# Patient Record
Sex: Male | Born: 1935 | Race: White | Hispanic: No | Marital: Married | Smoking: Former smoker
Health system: Southern US, Community
[De-identification: ages and names within clinical notes are randomized; demographics above are authoritative.]

## PROBLEM LIST (undated history)

## (undated) DIAGNOSIS — R42 Dizziness and giddiness: Secondary | ICD-10-CM

## (undated) DIAGNOSIS — G629 Polyneuropathy, unspecified: Secondary | ICD-10-CM

## (undated) DIAGNOSIS — I10 Essential (primary) hypertension: Secondary | ICD-10-CM

## (undated) DIAGNOSIS — Z860100 Personal history of colon polyps, unspecified: Secondary | ICD-10-CM

## (undated) DIAGNOSIS — H3530 Unspecified macular degeneration: Secondary | ICD-10-CM

## (undated) DIAGNOSIS — K219 Gastro-esophageal reflux disease without esophagitis: Secondary | ICD-10-CM

## (undated) DIAGNOSIS — E785 Hyperlipidemia, unspecified: Secondary | ICD-10-CM

## (undated) DIAGNOSIS — G252 Other specified forms of tremor: Secondary | ICD-10-CM

## (undated) DIAGNOSIS — N400 Enlarged prostate without lower urinary tract symptoms: Secondary | ICD-10-CM

## (undated) DIAGNOSIS — H9190 Unspecified hearing loss, unspecified ear: Secondary | ICD-10-CM

## (undated) DIAGNOSIS — G4730 Sleep apnea, unspecified: Secondary | ICD-10-CM

## (undated) DIAGNOSIS — I2510 Atherosclerotic heart disease of native coronary artery without angina pectoris: Secondary | ICD-10-CM

## (undated) DIAGNOSIS — H269 Unspecified cataract: Secondary | ICD-10-CM

## (undated) DIAGNOSIS — I219 Acute myocardial infarction, unspecified: Secondary | ICD-10-CM

## (undated) DIAGNOSIS — K259 Gastric ulcer, unspecified as acute or chronic, without hemorrhage or perforation: Secondary | ICD-10-CM

## (undated) DIAGNOSIS — Z87442 Personal history of urinary calculi: Secondary | ICD-10-CM

## (undated) DIAGNOSIS — M1990 Unspecified osteoarthritis, unspecified site: Secondary | ICD-10-CM

## (undated) DIAGNOSIS — J449 Chronic obstructive pulmonary disease, unspecified: Secondary | ICD-10-CM

## (undated) DIAGNOSIS — J189 Pneumonia, unspecified organism: Secondary | ICD-10-CM

## (undated) HISTORY — PX: TONSILLECTOMY: SUR1361

## (undated) HISTORY — DX: Unspecified macular degeneration: H35.30

## (undated) HISTORY — PX: EYE SURGERY: SHX253

## (undated) HISTORY — PX: OTHER SURGICAL HISTORY: SHX169

## (undated) HISTORY — PX: COLONOSCOPY: SHX174

---

## 1960-02-24 HISTORY — PX: NECK SURGERY: SHX720

## 2003-12-10 ENCOUNTER — Ambulatory Visit: Payer: Self-pay | Admitting: Internal Medicine

## 2005-02-23 HISTORY — PX: BACK SURGERY: SHX140

## 2005-02-24 ENCOUNTER — Inpatient Hospital Stay (HOSPITAL_COMMUNITY): Admission: RE | Admit: 2005-02-24 | Discharge: 2005-02-26 | Payer: Self-pay | Admitting: Neurosurgery

## 2005-04-14 ENCOUNTER — Ambulatory Visit: Payer: Self-pay | Admitting: Ophthalmology

## 2005-09-09 ENCOUNTER — Emergency Department: Payer: Self-pay | Admitting: Emergency Medicine

## 2005-10-16 ENCOUNTER — Ambulatory Visit: Payer: Self-pay | Admitting: Internal Medicine

## 2005-11-06 ENCOUNTER — Ambulatory Visit: Payer: Self-pay | Admitting: Internal Medicine

## 2007-02-24 DIAGNOSIS — I219 Acute myocardial infarction, unspecified: Secondary | ICD-10-CM

## 2007-02-24 HISTORY — PX: CORONARY ARTERY BYPASS GRAFT: SHX141

## 2007-02-24 HISTORY — DX: Acute myocardial infarction, unspecified: I21.9

## 2007-02-24 HISTORY — PX: OTHER SURGICAL HISTORY: SHX169

## 2007-07-30 ENCOUNTER — Other Ambulatory Visit: Payer: Self-pay

## 2007-07-30 ENCOUNTER — Emergency Department: Payer: Self-pay | Admitting: Emergency Medicine

## 2007-08-07 ENCOUNTER — Emergency Department: Payer: Self-pay | Admitting: Emergency Medicine

## 2007-10-26 ENCOUNTER — Encounter: Payer: Self-pay | Admitting: Internal Medicine

## 2007-11-24 ENCOUNTER — Encounter: Payer: Self-pay | Admitting: Internal Medicine

## 2007-12-25 ENCOUNTER — Encounter: Payer: Self-pay | Admitting: Internal Medicine

## 2008-01-24 ENCOUNTER — Encounter: Payer: Self-pay | Admitting: Internal Medicine

## 2009-04-28 ENCOUNTER — Emergency Department: Payer: Self-pay | Admitting: Emergency Medicine

## 2009-05-04 ENCOUNTER — Emergency Department: Payer: Self-pay | Admitting: Unknown Physician Specialty

## 2009-09-10 ENCOUNTER — Ambulatory Visit: Payer: Self-pay | Admitting: Internal Medicine

## 2009-09-16 ENCOUNTER — Ambulatory Visit: Payer: Self-pay | Admitting: Internal Medicine

## 2011-03-04 DIAGNOSIS — I119 Hypertensive heart disease without heart failure: Secondary | ICD-10-CM | POA: Diagnosis not present

## 2011-03-04 DIAGNOSIS — E782 Mixed hyperlipidemia: Secondary | ICD-10-CM | POA: Diagnosis not present

## 2011-03-04 DIAGNOSIS — I2510 Atherosclerotic heart disease of native coronary artery without angina pectoris: Secondary | ICD-10-CM | POA: Diagnosis not present

## 2011-03-04 DIAGNOSIS — R0602 Shortness of breath: Secondary | ICD-10-CM | POA: Diagnosis not present

## 2011-04-06 DIAGNOSIS — R109 Unspecified abdominal pain: Secondary | ICD-10-CM | POA: Diagnosis not present

## 2011-04-17 DIAGNOSIS — R51 Headache: Secondary | ICD-10-CM | POA: Diagnosis not present

## 2011-04-17 DIAGNOSIS — I10 Essential (primary) hypertension: Secondary | ICD-10-CM | POA: Diagnosis not present

## 2011-04-17 DIAGNOSIS — Z7982 Long term (current) use of aspirin: Secondary | ICD-10-CM | POA: Diagnosis not present

## 2011-04-17 DIAGNOSIS — Z79899 Other long term (current) drug therapy: Secondary | ICD-10-CM | POA: Diagnosis not present

## 2011-04-17 DIAGNOSIS — E785 Hyperlipidemia, unspecified: Secondary | ICD-10-CM | POA: Diagnosis not present

## 2011-04-17 DIAGNOSIS — Z888 Allergy status to other drugs, medicaments and biological substances status: Secondary | ICD-10-CM | POA: Diagnosis not present

## 2011-04-17 DIAGNOSIS — I2510 Atherosclerotic heart disease of native coronary artery without angina pectoris: Secondary | ICD-10-CM | POA: Diagnosis not present

## 2011-04-28 DIAGNOSIS — I10 Essential (primary) hypertension: Secondary | ICD-10-CM | POA: Diagnosis not present

## 2011-04-28 DIAGNOSIS — E782 Mixed hyperlipidemia: Secondary | ICD-10-CM | POA: Diagnosis not present

## 2011-04-28 DIAGNOSIS — G4730 Sleep apnea, unspecified: Secondary | ICD-10-CM | POA: Diagnosis not present

## 2011-05-26 DIAGNOSIS — H93299 Other abnormal auditory perceptions, unspecified ear: Secondary | ICD-10-CM | POA: Diagnosis not present

## 2011-05-27 DIAGNOSIS — H903 Sensorineural hearing loss, bilateral: Secondary | ICD-10-CM | POA: Diagnosis not present

## 2011-06-17 DIAGNOSIS — I10 Essential (primary) hypertension: Secondary | ICD-10-CM | POA: Diagnosis not present

## 2011-06-17 DIAGNOSIS — E785 Hyperlipidemia, unspecified: Secondary | ICD-10-CM | POA: Diagnosis not present

## 2011-06-17 DIAGNOSIS — I2510 Atherosclerotic heart disease of native coronary artery without angina pectoris: Secondary | ICD-10-CM | POA: Diagnosis not present

## 2011-06-17 DIAGNOSIS — Z0000 Encounter for general adult medical examination without abnormal findings: Secondary | ICD-10-CM | POA: Diagnosis not present

## 2011-07-08 DIAGNOSIS — R042 Hemoptysis: Secondary | ICD-10-CM | POA: Diagnosis not present

## 2011-07-13 ENCOUNTER — Ambulatory Visit: Payer: Self-pay | Admitting: Family Medicine

## 2011-07-13 DIAGNOSIS — R918 Other nonspecific abnormal finding of lung field: Secondary | ICD-10-CM | POA: Diagnosis not present

## 2011-07-13 DIAGNOSIS — R042 Hemoptysis: Secondary | ICD-10-CM | POA: Diagnosis not present

## 2011-07-15 ENCOUNTER — Ambulatory Visit: Payer: Self-pay | Admitting: Specialist

## 2011-07-15 DIAGNOSIS — R918 Other nonspecific abnormal finding of lung field: Secondary | ICD-10-CM | POA: Diagnosis not present

## 2011-07-15 DIAGNOSIS — R042 Hemoptysis: Secondary | ICD-10-CM | POA: Diagnosis not present

## 2011-07-15 DIAGNOSIS — E049 Nontoxic goiter, unspecified: Secondary | ICD-10-CM | POA: Diagnosis not present

## 2011-07-17 DIAGNOSIS — J449 Chronic obstructive pulmonary disease, unspecified: Secondary | ICD-10-CM | POA: Diagnosis not present

## 2011-07-17 DIAGNOSIS — R0602 Shortness of breath: Secondary | ICD-10-CM | POA: Diagnosis not present

## 2011-07-17 DIAGNOSIS — J984 Other disorders of lung: Secondary | ICD-10-CM | POA: Diagnosis not present

## 2011-09-09 DIAGNOSIS — G4730 Sleep apnea, unspecified: Secondary | ICD-10-CM | POA: Diagnosis not present

## 2011-09-09 DIAGNOSIS — I252 Old myocardial infarction: Secondary | ICD-10-CM | POA: Diagnosis not present

## 2011-09-09 DIAGNOSIS — I10 Essential (primary) hypertension: Secondary | ICD-10-CM | POA: Diagnosis not present

## 2011-09-09 DIAGNOSIS — E782 Mixed hyperlipidemia: Secondary | ICD-10-CM | POA: Diagnosis not present

## 2011-09-24 DIAGNOSIS — J441 Chronic obstructive pulmonary disease with (acute) exacerbation: Secondary | ICD-10-CM | POA: Diagnosis not present

## 2011-09-24 DIAGNOSIS — R05 Cough: Secondary | ICD-10-CM | POA: Diagnosis not present

## 2011-09-24 DIAGNOSIS — R059 Cough, unspecified: Secondary | ICD-10-CM | POA: Diagnosis not present

## 2011-12-02 DIAGNOSIS — H26499 Other secondary cataract, unspecified eye: Secondary | ICD-10-CM | POA: Diagnosis not present

## 2012-01-19 ENCOUNTER — Ambulatory Visit: Payer: Self-pay | Admitting: Specialist

## 2012-01-19 DIAGNOSIS — J984 Other disorders of lung: Secondary | ICD-10-CM | POA: Diagnosis not present

## 2012-01-19 DIAGNOSIS — R918 Other nonspecific abnormal finding of lung field: Secondary | ICD-10-CM | POA: Diagnosis not present

## 2012-03-14 DIAGNOSIS — I10 Essential (primary) hypertension: Secondary | ICD-10-CM | POA: Diagnosis not present

## 2012-03-14 DIAGNOSIS — K2100 Gastro-esophageal reflux disease with esophagitis, without bleeding: Secondary | ICD-10-CM | POA: Diagnosis not present

## 2012-03-14 DIAGNOSIS — I25810 Atherosclerosis of coronary artery bypass graft(s) without angina pectoris: Secondary | ICD-10-CM | POA: Diagnosis not present

## 2012-03-14 DIAGNOSIS — E785 Hyperlipidemia, unspecified: Secondary | ICD-10-CM | POA: Diagnosis not present

## 2012-05-16 DIAGNOSIS — G4730 Sleep apnea, unspecified: Secondary | ICD-10-CM | POA: Diagnosis not present

## 2012-05-16 DIAGNOSIS — E785 Hyperlipidemia, unspecified: Secondary | ICD-10-CM | POA: Diagnosis not present

## 2012-05-16 DIAGNOSIS — I10 Essential (primary) hypertension: Secondary | ICD-10-CM | POA: Diagnosis not present

## 2012-05-16 DIAGNOSIS — I25810 Atherosclerosis of coronary artery bypass graft(s) without angina pectoris: Secondary | ICD-10-CM | POA: Diagnosis not present

## 2012-06-22 DIAGNOSIS — Z0000 Encounter for general adult medical examination without abnormal findings: Secondary | ICD-10-CM | POA: Diagnosis not present

## 2012-06-22 DIAGNOSIS — I10 Essential (primary) hypertension: Secondary | ICD-10-CM | POA: Diagnosis not present

## 2012-06-22 DIAGNOSIS — I2510 Atherosclerotic heart disease of native coronary artery without angina pectoris: Secondary | ICD-10-CM | POA: Diagnosis not present

## 2012-06-22 DIAGNOSIS — IMO0002 Reserved for concepts with insufficient information to code with codable children: Secondary | ICD-10-CM | POA: Diagnosis not present

## 2012-06-22 DIAGNOSIS — E7800 Pure hypercholesterolemia, unspecified: Secondary | ICD-10-CM | POA: Diagnosis not present

## 2012-06-22 DIAGNOSIS — R198 Other specified symptoms and signs involving the digestive system and abdomen: Secondary | ICD-10-CM | POA: Diagnosis not present

## 2012-06-27 DIAGNOSIS — J209 Acute bronchitis, unspecified: Secondary | ICD-10-CM | POA: Diagnosis not present

## 2012-06-27 DIAGNOSIS — J069 Acute upper respiratory infection, unspecified: Secondary | ICD-10-CM | POA: Diagnosis not present

## 2012-06-28 DIAGNOSIS — L819 Disorder of pigmentation, unspecified: Secondary | ICD-10-CM | POA: Diagnosis not present

## 2012-06-28 DIAGNOSIS — D485 Neoplasm of uncertain behavior of skin: Secondary | ICD-10-CM | POA: Diagnosis not present

## 2012-06-28 DIAGNOSIS — D044 Carcinoma in situ of skin of scalp and neck: Secondary | ICD-10-CM | POA: Diagnosis not present

## 2012-06-28 DIAGNOSIS — L821 Other seborrheic keratosis: Secondary | ICD-10-CM | POA: Diagnosis not present

## 2012-07-04 ENCOUNTER — Ambulatory Visit: Payer: Self-pay | Admitting: Specialist

## 2012-07-04 DIAGNOSIS — Z951 Presence of aortocoronary bypass graft: Secondary | ICD-10-CM | POA: Diagnosis not present

## 2012-07-04 DIAGNOSIS — IMO0002 Reserved for concepts with insufficient information to code with codable children: Secondary | ICD-10-CM | POA: Diagnosis not present

## 2012-07-04 DIAGNOSIS — J984 Other disorders of lung: Secondary | ICD-10-CM | POA: Diagnosis not present

## 2012-07-04 DIAGNOSIS — R918 Other nonspecific abnormal finding of lung field: Secondary | ICD-10-CM | POA: Diagnosis not present

## 2012-07-04 DIAGNOSIS — Q619 Cystic kidney disease, unspecified: Secondary | ICD-10-CM | POA: Diagnosis not present

## 2012-07-04 DIAGNOSIS — E049 Nontoxic goiter, unspecified: Secondary | ICD-10-CM | POA: Diagnosis not present

## 2012-07-06 DIAGNOSIS — I119 Hypertensive heart disease without heart failure: Secondary | ICD-10-CM | POA: Diagnosis not present

## 2012-07-06 DIAGNOSIS — I495 Sick sinus syndrome: Secondary | ICD-10-CM | POA: Diagnosis not present

## 2012-07-06 DIAGNOSIS — I2510 Atherosclerotic heart disease of native coronary artery without angina pectoris: Secondary | ICD-10-CM | POA: Diagnosis not present

## 2012-07-06 DIAGNOSIS — E782 Mixed hyperlipidemia: Secondary | ICD-10-CM | POA: Diagnosis not present

## 2012-07-07 DIAGNOSIS — E785 Hyperlipidemia, unspecified: Secondary | ICD-10-CM | POA: Diagnosis not present

## 2012-07-07 DIAGNOSIS — R259 Unspecified abnormal involuntary movements: Secondary | ICD-10-CM | POA: Diagnosis not present

## 2012-07-07 DIAGNOSIS — R262 Difficulty in walking, not elsewhere classified: Secondary | ICD-10-CM | POA: Diagnosis not present

## 2012-07-07 DIAGNOSIS — R42 Dizziness and giddiness: Secondary | ICD-10-CM | POA: Diagnosis not present

## 2012-07-11 DIAGNOSIS — J449 Chronic obstructive pulmonary disease, unspecified: Secondary | ICD-10-CM | POA: Diagnosis not present

## 2012-07-11 DIAGNOSIS — J984 Other disorders of lung: Secondary | ICD-10-CM | POA: Diagnosis not present

## 2012-07-12 ENCOUNTER — Encounter: Payer: Self-pay | Admitting: Neurology

## 2012-07-12 DIAGNOSIS — IMO0001 Reserved for inherently not codable concepts without codable children: Secondary | ICD-10-CM | POA: Diagnosis not present

## 2012-07-12 DIAGNOSIS — R262 Difficulty in walking, not elsewhere classified: Secondary | ICD-10-CM | POA: Diagnosis not present

## 2012-07-12 DIAGNOSIS — M6281 Muscle weakness (generalized): Secondary | ICD-10-CM | POA: Diagnosis not present

## 2012-07-19 DIAGNOSIS — M6281 Muscle weakness (generalized): Secondary | ICD-10-CM | POA: Diagnosis not present

## 2012-07-19 DIAGNOSIS — IMO0001 Reserved for inherently not codable concepts without codable children: Secondary | ICD-10-CM | POA: Diagnosis not present

## 2012-07-19 DIAGNOSIS — R262 Difficulty in walking, not elsewhere classified: Secondary | ICD-10-CM | POA: Diagnosis not present

## 2012-07-21 DIAGNOSIS — R262 Difficulty in walking, not elsewhere classified: Secondary | ICD-10-CM | POA: Diagnosis not present

## 2012-07-21 DIAGNOSIS — IMO0001 Reserved for inherently not codable concepts without codable children: Secondary | ICD-10-CM | POA: Diagnosis not present

## 2012-07-21 DIAGNOSIS — M6281 Muscle weakness (generalized): Secondary | ICD-10-CM | POA: Diagnosis not present

## 2012-07-24 DIAGNOSIS — IMO0001 Reserved for inherently not codable concepts without codable children: Secondary | ICD-10-CM | POA: Diagnosis not present

## 2012-07-24 DIAGNOSIS — M6281 Muscle weakness (generalized): Secondary | ICD-10-CM | POA: Diagnosis not present

## 2012-07-24 DIAGNOSIS — G609 Hereditary and idiopathic neuropathy, unspecified: Secondary | ICD-10-CM | POA: Diagnosis not present

## 2012-07-24 DIAGNOSIS — R262 Difficulty in walking, not elsewhere classified: Secondary | ICD-10-CM | POA: Diagnosis not present

## 2012-07-25 ENCOUNTER — Ambulatory Visit: Payer: Self-pay | Admitting: Specialist

## 2012-07-25 DIAGNOSIS — N281 Cyst of kidney, acquired: Secondary | ICD-10-CM | POA: Diagnosis not present

## 2012-07-25 DIAGNOSIS — Q619 Cystic kidney disease, unspecified: Secondary | ICD-10-CM | POA: Diagnosis not present

## 2012-07-26 ENCOUNTER — Encounter: Payer: Self-pay | Admitting: Neurology

## 2012-08-10 DIAGNOSIS — G250 Essential tremor: Secondary | ICD-10-CM | POA: Diagnosis not present

## 2012-08-10 DIAGNOSIS — M545 Low back pain, unspecified: Secondary | ICD-10-CM | POA: Diagnosis not present

## 2012-08-10 DIAGNOSIS — R262 Difficulty in walking, not elsewhere classified: Secondary | ICD-10-CM | POA: Diagnosis not present

## 2012-08-10 DIAGNOSIS — R42 Dizziness and giddiness: Secondary | ICD-10-CM | POA: Diagnosis not present

## 2012-08-10 DIAGNOSIS — G252 Other specified forms of tremor: Secondary | ICD-10-CM | POA: Diagnosis not present

## 2012-08-29 DIAGNOSIS — N281 Cyst of kidney, acquired: Secondary | ICD-10-CM | POA: Diagnosis not present

## 2012-08-29 DIAGNOSIS — N183 Chronic kidney disease, stage 3 unspecified: Secondary | ICD-10-CM | POA: Diagnosis not present

## 2012-09-20 DIAGNOSIS — I25810 Atherosclerosis of coronary artery bypass graft(s) without angina pectoris: Secondary | ICD-10-CM | POA: Diagnosis not present

## 2012-09-20 DIAGNOSIS — I369 Nonrheumatic tricuspid valve disorder, unspecified: Secondary | ICD-10-CM | POA: Diagnosis not present

## 2012-09-20 DIAGNOSIS — I10 Essential (primary) hypertension: Secondary | ICD-10-CM | POA: Diagnosis not present

## 2012-09-20 DIAGNOSIS — E785 Hyperlipidemia, unspecified: Secondary | ICD-10-CM | POA: Diagnosis not present

## 2012-09-22 DIAGNOSIS — Z85828 Personal history of other malignant neoplasm of skin: Secondary | ICD-10-CM | POA: Diagnosis not present

## 2012-09-22 DIAGNOSIS — D239 Other benign neoplasm of skin, unspecified: Secondary | ICD-10-CM | POA: Diagnosis not present

## 2012-09-22 DIAGNOSIS — L578 Other skin changes due to chronic exposure to nonionizing radiation: Secondary | ICD-10-CM | POA: Diagnosis not present

## 2012-09-22 DIAGNOSIS — L820 Inflamed seborrheic keratosis: Secondary | ICD-10-CM | POA: Diagnosis not present

## 2012-09-22 DIAGNOSIS — D1800 Hemangioma unspecified site: Secondary | ICD-10-CM | POA: Diagnosis not present

## 2012-09-22 DIAGNOSIS — L821 Other seborrheic keratosis: Secondary | ICD-10-CM | POA: Diagnosis not present

## 2012-10-18 DIAGNOSIS — E785 Hyperlipidemia, unspecified: Secondary | ICD-10-CM | POA: Diagnosis not present

## 2012-10-18 DIAGNOSIS — I2510 Atherosclerotic heart disease of native coronary artery without angina pectoris: Secondary | ICD-10-CM | POA: Diagnosis not present

## 2012-10-18 DIAGNOSIS — G4730 Sleep apnea, unspecified: Secondary | ICD-10-CM | POA: Diagnosis not present

## 2012-10-18 DIAGNOSIS — I10 Essential (primary) hypertension: Secondary | ICD-10-CM | POA: Diagnosis not present

## 2012-11-09 DIAGNOSIS — I25810 Atherosclerosis of coronary artery bypass graft(s) without angina pectoris: Secondary | ICD-10-CM | POA: Diagnosis not present

## 2012-11-09 DIAGNOSIS — E785 Hyperlipidemia, unspecified: Secondary | ICD-10-CM | POA: Diagnosis not present

## 2012-11-09 DIAGNOSIS — I059 Rheumatic mitral valve disease, unspecified: Secondary | ICD-10-CM | POA: Diagnosis not present

## 2012-11-09 DIAGNOSIS — I10 Essential (primary) hypertension: Secondary | ICD-10-CM | POA: Diagnosis not present

## 2012-11-23 DIAGNOSIS — I25810 Atherosclerosis of coronary artery bypass graft(s) without angina pectoris: Secondary | ICD-10-CM | POA: Diagnosis not present

## 2012-11-23 DIAGNOSIS — R0609 Other forms of dyspnea: Secondary | ICD-10-CM | POA: Diagnosis not present

## 2012-11-23 HISTORY — PX: PROSTATE SURGERY: SHX751

## 2012-11-28 DIAGNOSIS — IMO0002 Reserved for concepts with insufficient information to code with codable children: Secondary | ICD-10-CM | POA: Diagnosis not present

## 2012-11-28 DIAGNOSIS — R269 Unspecified abnormalities of gait and mobility: Secondary | ICD-10-CM | POA: Diagnosis not present

## 2012-11-28 DIAGNOSIS — M4712 Other spondylosis with myelopathy, cervical region: Secondary | ICD-10-CM | POA: Diagnosis not present

## 2012-11-28 DIAGNOSIS — M4120 Other idiopathic scoliosis, site unspecified: Secondary | ICD-10-CM | POA: Diagnosis not present

## 2012-11-30 ENCOUNTER — Other Ambulatory Visit: Payer: Self-pay | Admitting: Neurosurgery

## 2012-11-30 DIAGNOSIS — M419 Scoliosis, unspecified: Secondary | ICD-10-CM

## 2012-11-30 DIAGNOSIS — E782 Mixed hyperlipidemia: Secondary | ICD-10-CM | POA: Diagnosis not present

## 2012-11-30 DIAGNOSIS — M4712 Other spondylosis with myelopathy, cervical region: Secondary | ICD-10-CM

## 2012-11-30 DIAGNOSIS — I059 Rheumatic mitral valve disease, unspecified: Secondary | ICD-10-CM | POA: Diagnosis not present

## 2012-11-30 DIAGNOSIS — I2510 Atherosclerotic heart disease of native coronary artery without angina pectoris: Secondary | ICD-10-CM | POA: Diagnosis not present

## 2012-11-30 DIAGNOSIS — I119 Hypertensive heart disease without heart failure: Secondary | ICD-10-CM | POA: Diagnosis not present

## 2012-12-12 ENCOUNTER — Ambulatory Visit
Admission: RE | Admit: 2012-12-12 | Discharge: 2012-12-12 | Disposition: A | Discharge status: Home / Self Care | Payer: PRIVATE HEALTH INSURANCE | Source: Ambulatory Visit | Attending: Neurosurgery | Admitting: Neurosurgery

## 2012-12-12 ENCOUNTER — Ambulatory Visit
Admission: RE | Admit: 2012-12-12 | Discharge: 2012-12-12 | Disposition: A | Discharge status: Home / Self Care | Payer: Medicare Other | Source: Ambulatory Visit | Attending: Neurosurgery | Admitting: Neurosurgery

## 2012-12-12 DIAGNOSIS — M4712 Other spondylosis with myelopathy, cervical region: Secondary | ICD-10-CM

## 2012-12-12 DIAGNOSIS — M419 Scoliosis, unspecified: Secondary | ICD-10-CM

## 2012-12-12 DIAGNOSIS — M47812 Spondylosis without myelopathy or radiculopathy, cervical region: Secondary | ICD-10-CM | POA: Diagnosis not present

## 2012-12-12 DIAGNOSIS — M4802 Spinal stenosis, cervical region: Secondary | ICD-10-CM | POA: Diagnosis not present

## 2012-12-12 DIAGNOSIS — M5126 Other intervertebral disc displacement, lumbar region: Secondary | ICD-10-CM | POA: Diagnosis not present

## 2012-12-12 DIAGNOSIS — M48061 Spinal stenosis, lumbar region without neurogenic claudication: Secondary | ICD-10-CM | POA: Diagnosis not present

## 2012-12-12 MED ORDER — GADOBENATE DIMEGLUMINE 529 MG/ML IV SOLN
16.0000 mL | Freq: Once | INTRAVENOUS | Status: AC | PRN
  Administered 2012-12-12: 16 mL via INTRAVENOUS

## 2012-12-13 ENCOUNTER — Ambulatory Visit
Admission: RE | Admit: 2012-12-13 | Discharge: 2012-12-13 | Disposition: A | Discharge status: Home / Self Care | Payer: PRIVATE HEALTH INSURANCE | Source: Ambulatory Visit | Attending: Neurosurgery | Admitting: Neurosurgery

## 2012-12-13 ENCOUNTER — Other Ambulatory Visit: Payer: Self-pay | Admitting: Neurosurgery

## 2012-12-13 DIAGNOSIS — G939 Disorder of brain, unspecified: Secondary | ICD-10-CM

## 2012-12-13 DIAGNOSIS — G9389 Other specified disorders of brain: Secondary | ICD-10-CM | POA: Diagnosis not present

## 2012-12-13 MED ORDER — GADOBENATE DIMEGLUMINE 529 MG/ML IV SOLN
16.0000 mL | Freq: Once | INTRAVENOUS | Status: AC | PRN
  Administered 2012-12-13: 16 mL via INTRAVENOUS

## 2012-12-14 DIAGNOSIS — M48061 Spinal stenosis, lumbar region without neurogenic claudication: Secondary | ICD-10-CM | POA: Diagnosis not present

## 2012-12-14 DIAGNOSIS — M4714 Other spondylosis with myelopathy, thoracic region: Secondary | ICD-10-CM | POA: Diagnosis not present

## 2012-12-14 DIAGNOSIS — G939 Disorder of brain, unspecified: Secondary | ICD-10-CM | POA: Diagnosis not present

## 2012-12-14 DIAGNOSIS — IMO0002 Reserved for concepts with insufficient information to code with codable children: Secondary | ICD-10-CM | POA: Diagnosis not present

## 2012-12-15 ENCOUNTER — Other Ambulatory Visit: Payer: Self-pay | Admitting: Neurosurgery

## 2012-12-15 DIAGNOSIS — G9389 Other specified disorders of brain: Secondary | ICD-10-CM

## 2012-12-16 ENCOUNTER — Ambulatory Visit
Admission: RE | Admit: 2012-12-16 | Discharge: 2012-12-16 | Disposition: A | Discharge status: Home / Self Care | Payer: Medicare Other | Source: Ambulatory Visit | Attending: Neurosurgery | Admitting: Neurosurgery

## 2012-12-16 ENCOUNTER — Ambulatory Visit
Admission: RE | Admit: 2012-12-16 | Discharge: 2012-12-16 | Disposition: A | Discharge status: Home / Self Care | Payer: PRIVATE HEALTH INSURANCE | Source: Ambulatory Visit | Attending: Neurosurgery | Admitting: Neurosurgery

## 2012-12-16 DIAGNOSIS — G9389 Other specified disorders of brain: Secondary | ICD-10-CM

## 2012-12-19 ENCOUNTER — Telehealth: Payer: Self-pay | Admitting: Oncology

## 2012-12-19 ENCOUNTER — Ambulatory Visit
Admission: RE | Admit: 2012-12-19 | Discharge: 2012-12-19 | Disposition: A | Discharge status: Home / Self Care | Payer: PRIVATE HEALTH INSURANCE | Source: Ambulatory Visit | Attending: Neurosurgery | Admitting: Neurosurgery

## 2012-12-19 ENCOUNTER — Ambulatory Visit
Admission: RE | Admit: 2012-12-19 | Discharge: 2012-12-19 | Disposition: A | Discharge status: Home / Self Care | Payer: Medicare Other | Source: Ambulatory Visit | Attending: Neurosurgery | Admitting: Neurosurgery

## 2012-12-19 DIAGNOSIS — N2889 Other specified disorders of kidney and ureter: Secondary | ICD-10-CM | POA: Diagnosis not present

## 2012-12-19 DIAGNOSIS — K7689 Other specified diseases of liver: Secondary | ICD-10-CM | POA: Diagnosis not present

## 2012-12-19 DIAGNOSIS — J984 Other disorders of lung: Secondary | ICD-10-CM | POA: Diagnosis not present

## 2012-12-19 MED ORDER — IOHEXOL 300 MG/ML  SOLN
100.0000 mL | Freq: Once | INTRAMUSCULAR | Status: AC | PRN
  Administered 2012-12-19: 100 mL via INTRAVENOUS

## 2012-12-19 NOTE — Telephone Encounter (Signed)
Referral received and forward to Dr. Truett Perna per md requested additional information.

## 2012-12-21 ENCOUNTER — Telehealth: Payer: Self-pay | Admitting: Oncology

## 2012-12-21 DIAGNOSIS — E785 Hyperlipidemia, unspecified: Secondary | ICD-10-CM | POA: Diagnosis not present

## 2012-12-21 DIAGNOSIS — I10 Essential (primary) hypertension: Secondary | ICD-10-CM | POA: Diagnosis not present

## 2012-12-21 NOTE — Telephone Encounter (Signed)
[facility] FOR PT TO RETURN CALL IN RE TO REFERRAL.  °

## 2012-12-22 ENCOUNTER — Telehealth: Payer: Self-pay | Admitting: Oncology

## 2012-12-22 NOTE — Telephone Encounter (Signed)
S/W HAYLEY AT DR. STERN OFFICE AND STATED THE THE PATIENT WOULD NEED A WORK-UP/LAB PRIOR TO COMING TO SEE THE MD. Jason Cross UNDERSTOOD AND STATED SHE WOULD CALL PATIENT.

## 2012-12-27 DIAGNOSIS — G939 Disorder of brain, unspecified: Secondary | ICD-10-CM | POA: Diagnosis not present

## 2013-01-10 DIAGNOSIS — G4733 Obstructive sleep apnea (adult) (pediatric): Secondary | ICD-10-CM | POA: Diagnosis not present

## 2013-01-10 DIAGNOSIS — J984 Other disorders of lung: Secondary | ICD-10-CM | POA: Diagnosis not present

## 2013-01-10 DIAGNOSIS — R05 Cough: Secondary | ICD-10-CM | POA: Diagnosis not present

## 2013-01-10 DIAGNOSIS — R0902 Hypoxemia: Secondary | ICD-10-CM | POA: Diagnosis not present

## 2013-01-10 DIAGNOSIS — J449 Chronic obstructive pulmonary disease, unspecified: Secondary | ICD-10-CM | POA: Diagnosis not present

## 2013-01-10 DIAGNOSIS — R059 Cough, unspecified: Secondary | ICD-10-CM | POA: Diagnosis not present

## 2013-01-25 DIAGNOSIS — M48061 Spinal stenosis, lumbar region without neurogenic claudication: Secondary | ICD-10-CM | POA: Diagnosis not present

## 2013-01-25 DIAGNOSIS — IMO0002 Reserved for concepts with insufficient information to code with codable children: Secondary | ICD-10-CM | POA: Diagnosis not present

## 2013-01-25 DIAGNOSIS — M4120 Other idiopathic scoliosis, site unspecified: Secondary | ICD-10-CM | POA: Diagnosis not present

## 2013-01-25 DIAGNOSIS — M4714 Other spondylosis with myelopathy, thoracic region: Secondary | ICD-10-CM | POA: Diagnosis not present

## 2013-01-26 ENCOUNTER — Other Ambulatory Visit: Payer: Self-pay | Admitting: Neurosurgery

## 2013-02-27 ENCOUNTER — Encounter (HOSPITAL_COMMUNITY): Payer: Self-pay

## 2013-03-01 NOTE — Pre-Procedure Instructions (Signed)
Jason Cross  03/01/2013   Your procedure is scheduled on:  Fri, Jan 16 @ 9:30 AM  Report to [facility] A at 6:30 AM.  Call this number if you have problems the morning of surgery: [phone]   Remember:   Do not eat food or drink liquids after midnight.   Take these medicines the morning of surgery with A SIP OF WATER: Omprazole(Prilosec)              Stop taking your Aspirin. No Goody's,BC's,Aleve,Ibuprofen,Fish Oil,or any Herbal Medications   Do not wear jewelry  Do not wear lotions, powders, or colognes. You may wear deodorant.  Men may shave face and neck.  Do not bring valuables to the hospital.  [facility] is not responsible                  for any belongings or valuables.               Contacts, dentures or bridgework may not be worn into surgery.  Leave suitcase in the car. After surgery it may be brought to your room.  For patients admitted to the hospital, discharge time is determined by your                treatment team.                  Special Instructions: Shower using CHG 2 nights before surgery and the night before surgery.  If you shower the day of surgery use CHG.  Use special wash - you have one bottle of CHG for all showers.  You should use approximately 1/3 of the bottle for each shower.   Please read over the following fact sheets that you were given: Pain Booklet, Coughing and Deep Breathing, Blood Transfusion Information, MRSA Information and Surgical Site Infection Prevention

## 2013-03-02 ENCOUNTER — Encounter (HOSPITAL_COMMUNITY): Payer: Self-pay

## 2013-03-02 ENCOUNTER — Encounter (HOSPITAL_COMMUNITY)
Admission: RE | Admit: 2013-03-02 | Discharge: 2013-03-02 | Disposition: A | Discharge status: Home / Self Care | Payer: Medicare Other | Source: Ambulatory Visit | Attending: Neurosurgery | Admitting: Neurosurgery

## 2013-03-02 DIAGNOSIS — Z01812 Encounter for preprocedural laboratory examination: Secondary | ICD-10-CM | POA: Diagnosis not present

## 2013-03-02 DIAGNOSIS — Z01818 Encounter for other preprocedural examination: Secondary | ICD-10-CM | POA: Insufficient documentation

## 2013-03-02 HISTORY — DX: Essential (primary) hypertension: I10

## 2013-03-02 HISTORY — DX: Dizziness and giddiness: R42

## 2013-03-02 HISTORY — DX: Personal history of colonic polyps: Z86.010

## 2013-03-02 HISTORY — DX: Atherosclerotic heart disease of native coronary artery without angina pectoris: I25.10

## 2013-03-02 HISTORY — DX: Sleep apnea, unspecified: G47.30

## 2013-03-02 HISTORY — DX: Acute myocardial infarction, unspecified: I21.9

## 2013-03-02 HISTORY — DX: Unspecified hearing loss, unspecified ear: H91.90

## 2013-03-02 HISTORY — DX: Unspecified osteoarthritis, unspecified site: M19.90

## 2013-03-02 HISTORY — DX: Personal history of urinary calculi: Z87.442

## 2013-03-02 HISTORY — DX: Polyneuropathy, unspecified: G62.9

## 2013-03-02 HISTORY — DX: Unspecified cataract: H26.9

## 2013-03-02 HISTORY — DX: Hyperlipidemia, unspecified: E78.5

## 2013-03-02 HISTORY — DX: Gastro-esophageal reflux disease without esophagitis: K21.9

## 2013-03-02 HISTORY — DX: Chronic obstructive pulmonary disease, unspecified: J44.9

## 2013-03-02 HISTORY — DX: Personal history of colon polyps, unspecified: Z86.0100

## 2013-03-02 HISTORY — DX: Gastric ulcer, unspecified as acute or chronic, without hemorrhage or perforation: K25.9

## 2013-03-02 HISTORY — DX: Pneumonia, unspecified organism: J18.9

## 2013-03-02 LAB — CBC
HCT: 43.2 % (ref 39.0–52.0)
Hemoglobin: 14.5 g/dL (ref 13.0–17.0)
MCH: 30 pg (ref 26.0–34.0)
MCHC: 33.6 g/dL (ref 30.0–36.0)
MCV: 89.4 fL (ref 78.0–100.0)
PLATELETS: 239 10*3/uL (ref 150–400)
RBC: 4.83 MIL/uL (ref 4.22–5.81)
RDW: 13.4 % (ref 11.5–15.5)
WBC: 7.6 10*3/uL (ref 4.0–10.5)

## 2013-03-02 LAB — BASIC METABOLIC PANEL
BUN: 27 mg/dL — ABNORMAL HIGH (ref 6–23)
CALCIUM: 9.4 mg/dL (ref 8.4–10.5)
CO2: 24 mEq/L (ref 19–32)
Chloride: 103 mEq/L (ref 96–112)
Creatinine, Ser: 1.55 mg/dL — ABNORMAL HIGH (ref 0.50–1.35)
GFR calc Af Amer: 46 mL/min — ABNORMAL LOW (ref >90)
GFR calc non Af Amer: 40 mL/min — ABNORMAL LOW (ref >90)
Glucose, Bld: 89 mg/dL (ref 70–99)
Potassium: 3.9 mEq/L (ref 3.7–5.3)
Sodium: 142 mEq/L (ref 137–147)

## 2013-03-02 LAB — ABO/RH: ABO/RH(D): O POS

## 2013-03-02 LAB — TYPE AND SCREEN
ABO/RH(D): O POS
Antibody Screen: NEGATIVE

## 2013-03-02 LAB — SURGICAL PCR SCREEN
MRSA, PCR: NEGATIVE
STAPHYLOCOCCUS AUREUS: NEGATIVE

## 2013-03-02 NOTE — Progress Notes (Signed)
Wife states pt tends to run a low blood pressure

## 2013-03-02 NOTE — Progress Notes (Addendum)
Dr.K with [facility] in [place] is cardiologist-last visit about 4months-to request   Echo report to be requested from Dr.K  Denies ever having a stress test or heart cath  EKG to be requested from Dr. Raliegh Ip  Dr.Fleming with [facility] to request CXR   Medical MD is Dr. Pati Gallo

## 2013-03-02 NOTE — Progress Notes (Addendum)
Sleep study done at [facility] about 2+yrs ago and to be requested from Dr.K with [facility]

## 2013-03-03 ENCOUNTER — Encounter (HOSPITAL_COMMUNITY): Payer: Self-pay

## 2013-03-03 NOTE — Progress Notes (Addendum)
Anesthesia Chart Review:  Patient is a 80 year old male scheduled for L3-4, L4-5, redo decompression/fusion with T12-L1 laminectomy on 03/10/13 by Dr. Vertell Limber.    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  Cardiology notes also list OSA.  PCP is Dr. Golden Pop. Pulmonologist is Dr. Wallene Huh. Cardiologist is Dr. Serafina Royals, last visit 11/30/12.  Echo on 11/23/12 [facility]) showed mild global LV dysfunction, EF 40%.  Mild MR/TR.  CXR on 01/10/13 (Dr. Raul Del) showed: Cardiac silhouette within normal limits. Patient is status post median sternotomy. Mild areas of linear density project within the region of the lingula likely representing areas of discoid atelectasis.  Mild infiltrate cannot be excluded.  No focal regions of consolidation are identified.  The osseus structures are unremarkable.  There is flattening of the hemidiaphragms.  Hyperventilation.   Preoperative labs noted.  BUN/Cr 27/1.55.  CBC WNL.    Patient with history of CABG in 2009 and LV dysfunction by recent echo.  He did see his cardiologist in 11/2012, but denied recent functional study.  Surgery is posted for 7 hours. I think he will need cardiac clearance. I have left a voicemail with Janett Billow at Dr. Melven Sartorius office. I will follow-up with her on Monday.  In the meantime, I have requested comparison labs from Dr. Rance Muir office and last pulmonary notes.    [facility] Short Stay Center/Anesthesiology Phone [phone] 03/03/2013 5:42 PM  Addendum: 03/06/2013 12:00 PM I spoke with Janett Billow earlier this morning, and she is sending a request for cardiac clearance to Dr. Nehemiah Massed.  I reviewed his last pulmonary notes.  He saw Dr. Raul Del on 01/10/13 for f/u abnormal chest CT (stable multiple pulmonary nodules < 62mm that were stable with f/u recommended 06/2013).  He also has mild COPD  which was felt stable and was being compliant with CPAP for OSA. Spirometry showed an FVC 3.6 (90%), FEV1 2.79 (91%), FEV1 ratio 78, FEF 25-75% 87% of predicted.  Expiratory flow volume loop overall appeared normal.  There was no sleep study on file at Dr. Leane Platt' office.  PCP records and EKG from cardiology are still pending. Will follow-up once/if patient is ultimately cleared by cardiology.  Addendum: 03/08/2012 4:25 PM Received cardiology clearance from Dr. Nehemiah Massed.  EKG is still pending. PCP records show his BUN/Cr on 12/21/12 were 31/1.68 and 23/1.45 on 06/22/12, so PAT results appear within his baseline. Sleep Study from 09/10/09 [facility]) received and showed OSA and central sleep apnea with follow-up study on 09/16/09 showing effective CPAP.

## 2013-03-06 ENCOUNTER — Encounter (HOSPITAL_COMMUNITY): Payer: Self-pay

## 2013-03-08 NOTE — Progress Notes (Addendum)
Re-reqested labs and OV notes from Dr Jeananne Rama.  Stated they will fax now.  [phone] Sleep study requested from [place].  Stated they will fax now.  [phone]

## 2013-03-09 MED ORDER — CEFAZOLIN SODIUM-DEXTROSE 2-3 GM-% IV SOLR
2.0000 g | INTRAVENOUS | Status: AC
  Administered 2013-03-10: 2 g via INTRAVENOUS
  Filled 2013-03-09: qty 50

## 2013-03-09 NOTE — H&P (Signed)
Patient ID:                [id number] Patient:                     Jason Cross                                    Date of Birth:   [birth year] Visit Type:                Office Visit                                                         Date:   01/25/2013 10:45 AM Provider:                  Marchia Meiers. Vertell Limber MD   This 80 year old male presents for Follow Up of neck pain.   HISTORY OF PRESENT ILLNESS: 1.  Follow Up of neck pain  Chest/Abdomen/Pelvis CT for review. Dr. Benay Spice referral declined after review of labs.   Pt hopes to proceed with surgery to Tx pain since CA testing has been negative (to inconclusive).  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   CT, MRI on [facility]   Patient's metastatic workup has been negative.  Dr. Benay Spice was of the opinion that he did not need to see him for concern of possible malignancy.  I explained to the patient and his wife that he does have an abnormal cranial MRI which will need to be followed.  In the meantime he continues to have severe low back and bilateral lower extremity pain.  I reviewed his MRI with him and his wife.  This shows that he has spondylosis and cord compression at T12-L1 level.  He has also had significant regrowth of bone with severe spinal stenosis and marked facet hypertrophy at the L3-L5 levels.  I explained that it will be very difficult to do redo decompression without disarticulating his facet joints given the extremely large regrowth and hypertrophy of these joints.  Because of this I recommended that he undergo decompression at T12-L1 level with decompression along with fusion L3-L5 levels.  Risks and benefits were discussed in detail with the patient and he wishes to proceed with surgery.  He is fitted for an LSO brace today.  We went over details of surgery and his imaging and answered his and his wife's questions.  We will plan on getting repeat cranial imaging in 6 months.          PAST MEDICAL/SURGICAL HISTORY  (Reviewed, updated)   Disease/disorder Onset Date Management Date Comments     Open heart surgery         Surgery, cervical spine         Surgery, lumbar spine     Hypertension         Myocardial infarction                 PAST MEDICAL HISTORY, SURGICAL HISTORY, FAMILY HISTORY, SOCIAL HISTORY AND REVIEW OF SYSTEMS I have reviewed the patient's past medical, surgical, family and social history as well as the comprehensive review of systems  as included on the [facility] history form dated 11/28/2012, which I have signed.   Family History  (Reviewed, updated)   Relationship Family Member Name Deceased Age at Death Condition Onset Age Cause of Death         Family history of Hypertension   N   SOCIAL HISTORY  (Reviewed, updated) Preferred language is Unknown.              MEDICATIONS(added, continued or stopped this visit):   Medication Dose Prescribed Else Ind Started Stopped aspirin  ORAL   Y     gabapentin 300 mg capsule 300 mg Y     hydrochlorothiazide UNKNOWN Y     Prevacid 15 mg capsule,delayed release 15 mg Y     simvastatin 40 mg tablet 40 mg Y         ALLERGIES:   Ingredient Reaction Medication Name Comment CODEINE       IODINE           Vitals Date Temp F BP Pulse Ht In Wt Lb BMI BSA Pain Score 01/25/2013   131/77 65 72 180 24.41   0/10             IMPRESSION I reviewed his MRI with him and his wife.  This shows that he has spondylosis and cord compression at T12-L1 level.  He has also had significant regrowth of bone with severe spinal stenosis and marked facet hypertrophy at the L3-L5 levels.  I explained that it will be very difficult to do redo decompression without disarticulating his facet joints given the extremely large regrowth and hypertrophy of these joints.  Because of this I recommended  that he undergo decompression at T12-L1 level with decompression along with fusion L3-L5 levels.  Risks and benefits were discussed in detail with the patient and he wishes to proceed with surgery.  He is fitted for an LSO brace today.  We went over details of surgery and his imaging and answered his and his wife's questions.  We will plan on getting repeat cranial imaging in 6 months.   Assessment/Plan # Detail Type Description 1. Assessment Lumbar spinal stenosis (724.02).       2. 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       3. Assessment Lumbar radiculopathy (724.4).       4. Assessment Lumbar scoliosis (737.30).           Surgery scheduled for 03/10/13.   Orders: Diagnostic Procedures: Assessment Procedure 724.02 Redo decompression with fusion L 34 and L 45 levels with laminectomy T 12-L1                Provider:  Marchia Meiers. Vertell Limber MD  01/29/2013 02:53 PM Dictation edited by: Marchia Meiers. Vertell Limber   Patient ID:                [id number] Patient:                     Jason Cross                                    Date of Birth:   1928-04-08 Visit Type:                Office Visit  Date:   12/14/2012 03:00 PM Provider:                  Marchia Meiers. Vertell Limber   Historian:                   self   This 80 year old male presents for neck pain and back pain.   HISTORY OF PRESENT ILLNESS: 1.  neck pain  Review MRI.   I reviewed her MRI scans in detail with the patient.  I also went back over his records from 8 years ago and in the there was mention of a lesion in the L1 vertebral body.  This sounds quite similar to the lesion is identified on today's scan.  In addition he has spinal stenosis at T12-L1 with cord signal which may in fact be a cause of his leg weakness and numbness.  In addition he has significant recurrent spinal stenosis at the L3-L4 level.  This will undoubtedly contributes to his leg symptoms  as well.  I did not detect significant spinal cord compression or a cervical study but at the edge of the cervical study a clival mass lesion was identified.  This led to obtaining an MRI of the brain and brain MRI was indeed suspicious for metastatic deposit in the clivus.   Patient denies smoking history.  He says his PSA has been checked recently.  I spoke with Dr. Tammi Klippel about his symptoms and reviewed his skin with Dr. Tammi Klippel.  Dr. Tammi Klippel is concerned that this might in fact represent a metastatic lesion and feels a metastatic workup should be performed.  This would include a CT scan of the chest abdomen and pelvis.  Our recommendation would be then that he see Dr. Illene Regulus to review these studies and get help as to whether or not he has metastatic cancer.  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   I spent 45 minutes and detailed discussion with the patient and his wife and would overall her scans and they're old records as well as talked to Dr. Tyler Pita about his condition and get advice from him as to how to proceed with this complex workup. 2.  back pain  Patient states pain down his legs and feet, 9/10 on pain scale.     Medical/Surgical/Interim History Reviewed, no change. Last detailed document date:11/28/2012.     PAST MEDICAL HISTORY, SURGICAL HISTORY, FAMILY HISTORY, SOCIAL HISTORY AND REVIEW OF SYSTEMS I have reviewed the patient's past medical, surgical, family and social history as well as the comprehensive review of systems as included on the [facility] history form dated 11/28/2012, which I have signed.   Family History: Reviewed, no changes.  Last detailed document date:11/28/2012.   Social History: Reviewed, no changes. Last detailed document date: 11/28/2012.        Medications (added, continued or  stopped this visit):   Medication Dose Prescribed Else Ind Started Stopped aspirin  ORAL   Y     gabapentin 300 mg capsule 300 mg Y     hydrochlorothiazide UNKNOWN Y           Allergies:   Ingredient Reaction Medication Name Comment CODEINE       IODINE       Reviewed, no changes.     Vitals Date Temp F BP Pulse Ht  In Wt Lb BMI BSA Pain Score 12/14/2012   136/75 73 72 180 24.41   0/10         DIAGNOSTIC RESULTS Diagnostic report text CLINICAL DATA: Abnormal MRI of the cervical spine. Clivus lesion.  EXAM: MRI HEAD WITHOUT AND WITH CONTRAST  TECHNIQUE: Multiplanar, multiecho pulse sequences of the brain and surrounding structures were obtained according to standard protocol without and with intravenous contrast  CONTRAST: 65mL MULTIHANCE GADOBENATE DIMEGLUMINE 529 MG/ML IV SOLN  COMPARISON: MRI of the and cervical spine 12/12/2012.  FINDINGS: A heterogeneous within the left side of the clivus the measures and 1.3 x 2.7 x 1.5 cm. The lesion is centered off midline. This signal is heterogeneous and fresh that without significant enhancement. There is no extension beyond the and bone.  Moderate generalized atrophy and diffuse white matter disease is present bilaterally. No acute cortical infarct, hemorrhage, or mass lesion is present. The ventricles are proportionate to the degree of atrophy. No significant extra-axial fluid collection is present.  The patient is status post bilateral lens extractions. A fluid level is present in the and right sphenoid sinus. Minimal mucosal thickening is present in the left sphenoid sinus. The remaining paranasal sinuses are clear. The mastoid air cells are clear.  The postcontrast images and demonstrate no pathologic enhancement.  IMPRESSION: 1. A slightly expansile lytic paramedian clival lesion. This may represent a chondroid lesion such as a chondrosarcoma or less likely clival  chordoma. Chondromyxoid fibroma at is included in the differential diagnosis. Metastasis or plasmocytoma must also be considered. CT of the head with thin cuts through the skull base would continue to be useful in evaluation of the matrix. 2. Diffuse atrophy and white matter disease. This likely reflects the sequelae of chronic microvascular ischemia.   Electronically Signed By: Lawrence Santiago M.D. On: 12/14/2012 07:56 Diagnostic report text CLINICAL DATA: Bilateral leg weakness and numbness.  BUN and creatinine were obtained on site at [place] at  [address]  Results: BUN 18 mg/dL, Creatinine 1.3 mg/dL.  EXAM: MRI LUMBAR SPINE WITHOUT AND WITH CONTRAST  TECHNIQUE: Multiplanar and multiecho pulse sequences of the lumbar spine were obtained without and with intravenous contrast.  CONTRAST: 92mL MULTIHANCE GADOBENATE DIMEGLUMINE 529 MG/ML IV SOLN  COMPARISON: Intraoperative lumbar spine radiograph 02/24/2005.  FINDINGS: A broad-based disc herniation at T12-L1 contacts the lower thoracic spinal cord resulting in moderate central canal stenosis. There is mild cord signal abnormality at this level. The conus medullaris terminates at L1-2, within normal limits. More inferiorly, the cord signal is normal.  A diffuse area of sclerosis is evident in the posterior central aspect of the left L1 vertebral body extending into the left pedicle. There is associated enhancement. The area measures 2.8 x 2.6 x 3.0 cm. No other significant sclerotic areas are evident. Diffuse fatty infiltration is present with multiple hemangiomas. Chronic fatty endplate marrow changes are present on the right at L4-5. Slight degenerative retrolisthesis is present at L4-5 is otherwise anatomic.  T12-L1: In addition to the central canal stenosis, mild foraminal narrowing is present bilaterally.  L1-2: A broad-based disc herniation is asymmetric to the left. Mild facet hypertrophy is  worse on the left as well. This leads to moderate to severe left foraminal stenosis. More mild right foraminal narrowing is present. Moderate left and mild right lateral recess narrowing is present.  L2-3: A broad-based disc herniation is present. Moderate facet hypertrophy is evident. Mild lateral recess and foraminal narrowing is worse on the left.  L3-4: Advanced facet hypertrophy is noted. The patient is status post laminectomy. Enhancing granulation tissue is present. Moderate to severe residual central and foraminal stenosis is evident, left greater than right.  L4-5: Disc herniation is asymmetric to the left. Moderate to severe right foraminal stenosis is present. Moderate left lateral recess narrowing is present. Mild right lateral recess and left foraminal narrowing is present.  L5-S1: A mild broad-based disc herniation is present. Mild facet hypertrophy is noted bilaterally. Facet spurring contributes to mild foraminal narrowing on both sides. The central canal is patent.  IMPRESSION: 1. Broad-based disc herniation and endplate changes at [id number] results in moderate central canal stenosis with distortion of the distal thoracic spinal cord and abnormal signal, suggesting edema. This could be the etiology of the patient's symptoms. 2. 3 cm area of sclerosis in the posterior left L1 vertebral body with associated enhancement is concerning for metastatic disease. Please correlate with any history of known cancer. If no known malignancy is present, percutaneous biopsy could be performed. 3. Moderate to severe left foraminal stenosis at L1-2. 4. Moderate left and mild right lateral recess narrowing at L1-2 mild lateral recess and foraminal narrowing at L2-3 is worse on the left. 5. Moderate to severe central and foraminal stenosis at L3-4. 6. Moderate left and mild right lateral recess narrowing at L4-5. 7. Moderate to severe right and mild left foraminal stenosis  at L4-5. 8. Mild foraminal narrowing bilaterally at L5-S1 without significant central canal stenosis.   Electronically Signed By: Lawrence Santiago M.D. On: 12/12/2012 16:13 Diagnostic report text CLINICAL DATA: Neck popping and left arm tingling. Prior cervical spine operation in 1962. Cervical spondylosis with myelopathy.  EXAM: MRI CERVICAL SPINE WITHOUT CONTRAST  TECHNIQUE: Multiplanar, multisequence MR imaging was performed. No intravenous contrast was administered.  COMPARISON: None.  FINDINGS: There is a mild dextro convex torticollis with the apex at C4. 2 mm anterolisthesis of C3 on C4 appears degenerative. Postsurgical changes are present in the paraspinal soft tissues at C3-C4 and C4-C5. Cervical cord demonstrates normal caliber and intra medullary signal. Posterior fossa structures are within normal limits. There is an abnormal appearance of the clivus with a heterogeneous lesion in the medullary space measuring 3 cm craniocaudal and 13 mm AP. The appearance on this noncontrast study is indeterminate. Metastatic disease could produce this appearance if patient has known malignancy. This potentially represents a chondroid lesion however a chordoma could also produce this appearance. There is no extraosseous extension of the mass. The clivus does appear mildly expanded. Significantly, there does not appear to be mass effect on the visualized pons or middle.  Flow voids are present in both vertebral arteries. Diffuse disc desiccation is present.  C2-C3: Posterior ligamentum flavum redundancy is present. Right foraminal encroachment associated with right greater than left facet arthrosis potentially affects the right C3 nerve.  C4-C5: Disk desiccation and degeneration with shallow broad-based disc osteophyte complex. Central canal is adequately patent. Bilateral foraminal stenosis secondary to both facet arthrosis and uncovertebral spurring.  C4-C5: Shallow  broad-based disc osteophyte complex eccentric to the right. Mild central stenosis. Facet arthrosis and uncovertebral spurring produce right greater than left foraminal stenosis potentially affecting both C5 nerves.  C5-C6: Broad-based disc osteophyte complex with mild to moderate central stenosis. Disc osteophyte complex narrows the ventral subarachnoid space and mildly flattens the cord. There is right greater than left foraminal stenosis secondary to uncovertebral spurring with some contribution from facet arthrosis. AP diameter of the central canal is 9 mm.  C6-C7: Mild  central stenosis with left eccentric disc osteophyte complex. Flattening of the central and left ventral cord. There is bilateral foraminal stenosis which is mild, secondary to uncovertebral spurring bilaterally.  C7-T1: Mild central stenosis with flattening of the left ventral cord. Left eccentric disc osteophyte complex. Mild bilateral foraminal stenosis associated with uncovertebral spurring. Facet arthrosis is also present.  T1-T2 shows disc desiccation without stenosis. T2-T3 shows a right paracentral protrusion with mild central stenosis.  IMPRESSION: 1. Intramedullary lesion in the clivus is indeterminate. Followup MRI of brain with and without contrast is recommended for full visualization and further characterization. A noncontrast head CT with thin cuts and reconstructions through the posterior fossa may also be useful to assess for bony matrix. These results will be called to the ordering clinician or representative by the Radiologist Assistant, and communication documented in the PACS Dashboard. 2. Multilevel cervical spondylosis detailed above. Central stenosis is mild to moderate at C5-C6 with flattening of the ventral cord. Multilevel foraminal stenosis and facet arthrosis potentially accounts for left radicular symptoms. 3. Postoperative changes in the dorsal soft tissues at C4-C5 and C5-C6  without cervical fusion ; that type of PROCEDURE leading to these postoperative changes is unclear.   Electronically Signed By: Dereck Ligas M.D. On: 12/12/2012 16:21       IMPRESSION Clival mass of uncertain etiology.  Metastatic workup to rule out cancer.  If this is negative the patient will need to undergo laminectomy at T12-L1 for spinal cord compression as well as redo decompression at the L3-L4 levels.   Assessment/Plan # Detail Type Description 1. Assessment Brain mass (348.9).       2. Assessment Lumbar radiculopathy (724.4).       3. 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       4. Assessment Lumbar spinal stenosis (724.02).           Pain Assessment/Treatment Pain Scale: 0/10. Method: Numeric Pain Intensity Scale. Pain Assessment/Treatment follow-up plan of care: Patient denies pain in the neck or back but has pain in the legs and feet.  Patient occasionally takes Advill for the pain..   Chest abdomen pelvis CT with followup after seeing Dr. Learta Codding.   " Late entry due to failure to e-sign on date of appointment.  I attest that I reviewed the patient registration form on 12/14/12,  and am e-signing today.  This was brought to my attention due to internal audit."       Orders: Diagnostic Procedures: Assessment Procedure 348.9 CT Abd & Pel; With Contrast 348.9 ct chest abd pelvis  appt dr Benay Spice 348.9 Return to Clinic                Provider:  Marchia Meiers. Vertell Limber  12/19/2012 01:56 PM Dictation edited by: Marchia Meiers. Vertell Limber     Patient ID:                [id number] Patient:                     Jason Cross                                    Date of Birth:   12-Jan-1929 Visit Type:                Office Visit  Date:   11/28/2012 10:30 AM Provider:                  Marchia Meiers. Vertell Limber   Historian:                   self   This 80 year old male presents for back pain.    HISTORY OF PRESENT ILLNESS: 1.  back pain  80 y.o. male, last seen for epidural mass resection at L3-4 level in 2007, returns for evaluation of lumbar and bilateral lower leg pain.  He reports pain has increased over the past 2years. No injury.  PT has not helped decrease the pain.   Lumbar pain decreases when seated in recliner.  R>L lower leg pain, numbness, tingling is constant. Right ankle with "rubber band" tightening sensation.   Gabapentin 300mg  1/day for BUE tremor. Tylenol or Advil 1-2/week prn.   X-rays today on [facility].   [Of note: Open heart surgery 2009 at [facility].  Echo last week. Sees cardiology tomorow for annual exam. "No problems" last year.]   Patient describes low back pain at 15 out of 10 in severity.  She has engaged in a physical therapy and says this has not helped him.  He currently complains of leg pain right greater than left to the lateral shin.  He says he has pain with light manual work.         PAST MEDICAL/SURGICAL HISTORY  (Detailed)   Disease/disorder Onset Date Management Date Comments     Open heart surgery         Surgery, cervical spine         Surgery, lumbar spine     Hypertension         Myocardial infarction                 PAST MEDICAL HISTORY, SURGICAL HISTORY, FAMILY HISTORY, SOCIAL HISTORY AND REVIEW OF SYSTEMS I have reviewed the patient's past medical, surgical, family and social history as well as the comprehensive review of systems as included on the [facility] history form dated 11/28/2012, which I have signed.   Family History  (Detailed)   Relationship Family Member Name Deceased Age at Death Condition Onset Age Cause of Death         Family history of Hypertension   N   SOCIAL HISTORY  (Detailed) Tobacco use reviewed. Preferred language is Unknown.  Smoking status: Never smoker.   SMOKING STATUS Use Status Type Smoking Status Usage Per Day Years  Used Total Pack Years no/never   Never smoker                       Medications (added, continued or stopped this visit):   Medication Dose Prescribed Else Ind Started Stopped aspirin  ORAL   Y     gabapentin 300 mg capsule 300 mg Y     hydrochlorothiazide UNKNOWN Y           Allergies:   Ingredient Reaction Medication Name Comment CODEINE       IODINE       New allergies added during this encounter. Active list above.   REVIEW OF SYSTEMS: System Neg/Pos Details Constitutional Negative Chills, fatigue, fever, malaise, night sweats, weight gain and weight loss. ENMT Negative Ear drainage, hearing loss, nasal drainage, otalgia, sinus pressure and sore throat. Eyes Negative Eye discharge, eye pain and vision changes. Respiratory Negative Chronic cough, cough, dyspnea,  known TB exposure and wheezing. Cardio Negative Chest pain, claudication, edema and irregular heartbeat/palpitations. GI Negative Abdominal pain, blood in stool, change in stool pattern, constipation, decreased appetite, diarrhea, heartburn, nausea and vomiting. GU Negative Erectile dysfunction. GU Positive Kidney stones. Endocrine Negative Cold intolerance, heat intolerance, polydipsia and polyphagia. Neuro Positive Extremity weakness, Gait disturbance, Numbness in extremities, Tremors. Psych Negative Anxiety, depression and insomnia. Integumentary Negative Brittle hair, brittle nails, change in shape/size of mole(s), hair loss, hirsutism, hives, pruritus, rash and skin lesion. MS Positive Back pain, BLE pain. Hema/Lymph Negative Easy bleeding, easy bruising and lymphadenopathy. Allergic/Immuno Negative Contact allergy, environmental allergies, food allergies and seasonal allergies. Reproductive Negative Penile discharge and sexual dysfunction.     Vitals Date Temp F BP Pulse Ht In Wt Lb BMI BSA Pain Score 11/28/2012    127/82 73 72 180 24.41   5/10                                                                                                                     PHYSICAL EXAM General Level of Distress:             no acute distress Overall Appearance:        normal   Head and Face                                                 Right                                     Left                        Fundoscopic Exam:        normal                                   normal       Cardiovascular Cardiac:                                regular rate and rhythm without murmur                                                 Right                                     Left  Carotid Pulses:                  normal                                   normal   Respiratory Lungs:                                   clear to auscultation   Neurological Orientation:                                                         normal Recent and Remote Memory:                      normal Attention Span and Concentration:           normal Language:                                                           normal Fund of Knowledge:                                        normal                                                                                 Right                                     Left Sensation:                                                           normal                                   normal Upper Extremity Coordination:                    normal                                   normal   Lower Extremity Coordination:                    normal  normal   Musculoskeletal Gait and Station:                                               normal                                                                                 Right                                     Left Upper Extremity Muscle Strength:             normal                                    normal Lower Extremity Muscle Strength:              normal                                   normal Upper Extremity Muscle Tone:                    normal                                   normal Lower Extremity Muscle Tone:                     normal                                   normal   Motor Strength Upper and lower extremity motor strength was tested in the clinically pertinent muscles.     Deep Tendon Reflexes                                                 Right                                     Left Biceps:                                 increase                                increase Triceps:  increase                                increase Brachiloradialis:                 increase                                increase Patellar:                                normal                                   normal Achilles:                               normal                                   normal   Sensory  .Any abnormal findings will be noted below..                                                 Right                                     Left L4:                                          decreased                                                     Cranial Nerves II. Optic Nerve/Visual Fields:     normal III. Oculomotor:                              normal IV. Trochlear:                                   normal V. Trigeminal:                                  normal VI. Abducens:                                 normal VII. Facial:  normal VIII. Acoustic/Vestibular:            normal IX. Glossopharyngeal:                 normal X. Vagus:                                         normal XI. Spinal Accessory:                  normal XII. Hypoglossal:                           normal   Motor and other Tests Lhermittes:                          negative Rhomberg:                           negative Pronator drift:                    absent                                                                                                   Right                                     Left Hoffman's:                          present                                 present Clonus:                                 normal                                   normal Babinski:                              normal                                   normal     Additional Findings:   Patient has a well-healed lumbar midline incision.  He is able to bend to within 8 inches of the floor with his upper extremities outstretched.  He complains of pain in his toes on the right.  He is atrophy of his right shin.  He is decreased a bur was  used to square off on his right leg.       DIAGNOSTIC RESULTS Lumbar radiographs demonstrate dextral convex scoliosis.  There is a retrolisthesis of L4 on L5 and foraminal stenosis greater on the right at this level.       IMPRESSION Patient has hyperreflexia and positive Hawkins sign right greater than left in the upper extremities.  He has weakness in his right leg and complains of severe pain.  He has scoliosis and spondylolisthesis.  He has significant heart disease.  He is going to have an assessment by his cardiologist in the near future.  He has not had an MRI of his back or neck.   Completed Orders (this encounter) Order Details Reason Side Interpretation Result Initial Treatment Date Region Lumbar Spine- AP/Lat/Flex/Ex           11/28/2012 All Levels to All Levels   Assessment/Plan # Detail Type Description 1. Assessment Abnormal gait (781.2).       2. 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       3. Assessment Lumbar scoliosis (737.30).       4. Assessment Lumbar radiculopathy (724.4).           I recommended the patient have an MRI of his cervical spine without contrast and of his lumbar spine with  and without contrast, given his history of epidural mass resection and he will follow up with me to review these.   Orders: Office Procedures/Services: Assessment Service Comments   Note: 2 MRIs ordered!     Diagnostic Procedures: Assessment Procedure   Lumbar Spine- AP/Lat/Flex/Ex 721.1 MRI Spinal/cerv W/o Contrast 721.1 Return to Clinic after study is performed 737.30 MRI Spine/lumb With & W/o Contrast 737.30 MRI Spine/lumb With & W/o Contrast                Provider:  Marchia Meiers. Vertell Limber  12/11/2012 07:02 PM Dictation edited by: Marchia Meiers. [facility]     NEUROSURGICAL CONSULTATION   Jason Cross, Jason Cross. DOB:  Jun 17, 1928                               [id number]  February 11, 2005   HISTORY:                                                                             Jason Cross is a 80 year old retired man, who used to work for the [employer] in [employer], who presents at the request of Dr. Jeananne Rama for neurosurgical consultation for back and leg pain.  He currently describes 8/10 pain.  Says it has been going on for greater than 6 months, with pain predominantly in his left leg, but also his low back.  He complains of pain below from the waist down, left greater than right, and numbness into both his legs.  He feels he has weakness in his left leg.  He has been taking Tylenol for pain, but says it does not help him a lot.  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  This was felt to possibly represent neoplastic lesion.  He has a bony lesion in superior posterior portion of L1 vertebral body, and it was felt that this might potentially represent metastatic lesion as well.  He has marked degenerative changes at the L1-2, L2-3, L4-5, and L5-S1 disks.   There is no evidence of posterior disk herniation at other levels.  Denies any bowel or bladder dysfunction.   Chest x-ray was negative.   REVIEW OF SYSTEMS:                                     A detailed Review of Systems sheet was reviewed with the patient.  Positive for wearing glasses, ringing in ears, back pain, leg pain, arthritis.      PAST MEDICAL HISTORY:                                         Prior Operations and Hospitalizations:            Posterior cervical diskectomy in 1962 in [place], and otherwise has not had any prior surgeries.            Medications and Allergies:                   Avodart 50 mg daily; doxazosin 4 mg daily; aspirin low dose 81 mg daily.  CODEINE CAUSES HIM TO SWEAT AND BE HYPER.            Height and Weight:                                                 6'1" tall, 165 pounds.   FAMILY HISTORY:                                                             Both parents deceased.  Mother died at age 43.  Father died at age 71.   SOCIAL HISTORY:                                                              Nonsmoker.  He says he started smoking in 1952, and quit in 1999.  Nondrinker of alcoholic beverages.  No history of substance abuse. Jason Cross, Jr. DOB:  [birth year]                               [id number]  February 11, 2005 Page Two   PHYSICAL EXAMINATION:  General Appearance:                                             Jason Cross is a pleasant, cooperative, uncomfortable appearing man in no acute distress.             Blood Pressure, Pulse:                                           Blood pressure 160/100, right arm seated.  He says he has "white coat syndrome."  Heart rate 80 and regular.            HEENT - normocephalic, atraumatic.  The pupils are equal, round and reactive to light.  The extraocular muscles are intact.  Sclerae - white.  Conjunctiva - pink.  Oropharynx benign.  Uvula midline.             Neck - there are no masses, meningismus, deformities, tracheal deviation, jugular vein distention or carotid bruits.  There is normal cervical range of motion.  Spurlings' test is negative without reproducible radicular pain turning the patient's head to either side.  Lhermitte's sign is not present with axial compression.             Respiratory - there is normal respiratory effort with good intercostal function.  Lungs are clear to auscultation.  There are no rales, rhonchi or wheezes.             Cardiovascular - the heart has regular rate and rhythm to auscultation.  No murmurs are appreciated.  There is no extremity edema, cyanosis or clubbing.  There are palpable pedal pulses.             Abdomen - soft, nontender, no hepatosplenomegaly appreciated or masses.  There are active bowel sounds.  No guarding or rebound.             Musculoskeletal Examination - he has right paraspinous muscular spasms and left greater than right sciatic notch discomfort to palpation.  He is limited in terms of forward bending.  He is able to bend with his upper extremities outstretched to within 18 inches of the floor before he gets significant pain.  He is able to stand on his heels and toes.  He is able to squat on either leg.  He has an antalgic gait, favoring his left lower extremity.  Positive straight leg raise on the left, negative on the right.    The patient is able to walk about the examining room with a normal, casual, heel and toe gait.    NEUROLOGICAL EXAMINATION:               The patient is oriented to time, person and place and has good recall of both recent and remote memory with normal attention span and concentration.  The patient speaks with clear and fluent speech and exhibits normal language function and appropriate fund of knowledge.     Jason Cross, Jr. DOB:  Oct 16, 1928                               #[id number]  February 11, 2005 Page Three  Cranial Nerve Examination -  pupils are equal, round and reactive to light.  Extraocular movements are full.  Visual fields are full to confrontational testing.  Facial sensation and facial movement are symmetric and intact.  Hearing is intact to finger rub.  Palate is upgoing.  Shoulder shrug is symmetric.  Tongue protrudes in the midline.             Motor Examination - motor strength is 5/5 in the bilateral deltoids, biceps, triceps, handgrips, wrist extensors, interosseous.  In the lower extremities motor strength is 5/5 in hip flexion, extension, quadriceps, hamstrings, plantar flexion, dorsiflexion and extensor hallucis longus, with the exception of 4/5 left EHL strength.             Sensory Examination - increased pin sensation in the left great toe.  Otherwise intact and symmetric.             Deep Tendon Reflexes - 2 in the biceps, triceps, and brachioradialis, 2 in the knees, 2 in the right ankle, 1 in the left ankle.  The great toes are downgoing to plantar stimulation.                Cerebellar Examination - normal coordination in upper and lower extremities and normal rapid alternating movements.  Romberg test is negative.    IMPRESSION AND RECOMMENDATIONS:             Jason Cross is a 80 year old man with severe lumbar degeneration at the L3-4 level, but appears to me to represent a synovial cyst or degenerative facet arthritic process, rather than a neoplastic deposit with significant canal compromise at the L3-4 level more to the left than to the right.  I have recommended that he undergo surgical decompression of this region, also that biopsies can be performed if this indeed turns out to be suggestive of neoplasia.  I told him I thought it was more likely that it may represent a synovial cyst.  I also think that the length of time this has been brewing would suggest that this is a more chronic process.  I reviewed the studies with the patient and went over his physical examination.  I reviewed surgical models  and discussed the typical hospital course and operative and postoperative course and the potential risks and benefits of surgery.  The risks of surgery were discussed in detail and include, but are not limited to, the risks of anesthesia, blood loss and the possibility of hemorrhage, infection, damage to nerves, damage to blood vessels, injury to the lumbar nerve root causing either temporary or permanent leg pain, numbness, weakness.  There is potential for spinal fluid leak from dural tear.  There is the potential for post-laminectomy spondylolisthesis, recurrent disc ruptured quoted at approximately 10%, failure to relieve pain, worsening of pain, need for further surgery.  He wishes to go ahead with surgery after the holidays, and have set this up for 02/24/2005.   [facility]   Marchia Meiers. Vertell Limber, M.D.      ----------------------------------------------------------------------------------------------------------------------------------------------------------------------

## 2013-03-09 NOTE — Progress Notes (Signed)
Pt to arrive at 7:30 AM     DA

## 2013-03-09 NOTE — Progress Notes (Signed)
No recent EKG per Dr. Tyler Pita office.

## 2013-03-10 ENCOUNTER — Encounter (HOSPITAL_COMMUNITY)
Admission: RE | Disposition: A | Discharge status: Other Acute Inpatient Hospital | Payer: PRIVATE HEALTH INSURANCE | Source: Ambulatory Visit | Attending: Neurosurgery

## 2013-03-10 ENCOUNTER — Inpatient Hospital Stay (HOSPITAL_COMMUNITY): Payer: Medicare Other

## 2013-03-10 ENCOUNTER — Encounter (HOSPITAL_COMMUNITY): Payer: Medicare Other | Admitting: Vascular Surgery

## 2013-03-10 ENCOUNTER — Inpatient Hospital Stay (HOSPITAL_COMMUNITY): Payer: Medicare Other | Admitting: Certified Registered Nurse Anesthetist

## 2013-03-10 ENCOUNTER — Inpatient Hospital Stay (HOSPITAL_COMMUNITY)
Admission: RE | Admit: 2013-03-10 | Discharge: 2013-03-14 | DRG: 460 | Payer: Medicare Other | Source: Ambulatory Visit | Attending: Neurosurgery | Admitting: Neurosurgery

## 2013-03-10 ENCOUNTER — Encounter (HOSPITAL_COMMUNITY): Payer: Self-pay | Admitting: *Deleted

## 2013-03-10 DIAGNOSIS — M4712 Other spondylosis with myelopathy, cervical region: Secondary | ICD-10-CM | POA: Diagnosis not present

## 2013-03-10 DIAGNOSIS — Z87891 Personal history of nicotine dependence: Secondary | ICD-10-CM | POA: Diagnosis not present

## 2013-03-10 DIAGNOSIS — I252 Old myocardial infarction: Secondary | ICD-10-CM

## 2013-03-10 DIAGNOSIS — G609 Hereditary and idiopathic neuropathy, unspecified: Secondary | ICD-10-CM | POA: Diagnosis present

## 2013-03-10 DIAGNOSIS — M48061 Spinal stenosis, lumbar region without neurogenic claudication: Principal | ICD-10-CM | POA: Diagnosis present

## 2013-03-10 DIAGNOSIS — Z8249 Family history of ischemic heart disease and other diseases of the circulatory system: Secondary | ICD-10-CM

## 2013-03-10 DIAGNOSIS — M542 Cervicalgia: Secondary | ICD-10-CM | POA: Diagnosis not present

## 2013-03-10 DIAGNOSIS — M419 Scoliosis, unspecified: Secondary | ICD-10-CM | POA: Diagnosis present

## 2013-03-10 DIAGNOSIS — M4120 Other idiopathic scoliosis, site unspecified: Secondary | ICD-10-CM | POA: Diagnosis present

## 2013-03-10 DIAGNOSIS — M4714 Other spondylosis with myelopathy, thoracic region: Secondary | ICD-10-CM | POA: Diagnosis not present

## 2013-03-10 DIAGNOSIS — IMO0002 Reserved for concepts with insufficient information to code with codable children: Secondary | ICD-10-CM | POA: Diagnosis not present

## 2013-03-10 DIAGNOSIS — M48062 Spinal stenosis, lumbar region with neurogenic claudication: Secondary | ICD-10-CM | POA: Diagnosis not present

## 2013-03-10 DIAGNOSIS — I10 Essential (primary) hypertension: Secondary | ICD-10-CM | POA: Diagnosis not present

## 2013-03-10 DIAGNOSIS — M519 Unspecified thoracic, thoracolumbar and lumbosacral intervertebral disc disorder: Secondary | ICD-10-CM | POA: Diagnosis not present

## 2013-03-10 DIAGNOSIS — M4804 Spinal stenosis, thoracic region: Secondary | ICD-10-CM | POA: Diagnosis not present

## 2013-03-10 DIAGNOSIS — N319 Neuromuscular dysfunction of bladder, unspecified: Secondary | ICD-10-CM | POA: Diagnosis not present

## 2013-03-10 HISTORY — PX: THORACIC DISCECTOMY: SHX6113

## 2013-03-10 SURGERY — POSTERIOR LUMBAR FUSION 2 LEVEL
Anesthesia: General | Site: Back

## 2013-03-10 MED ORDER — ALUM & MAG HYDROXIDE-SIMETH 200-200-20 MG/5ML PO SUSP
30.0000 mL | Freq: Four times a day (QID) | ORAL | Status: DC | PRN
  Administered 2013-03-12: 30 mL via ORAL
  Filled 2013-03-10: qty 30

## 2013-03-10 MED ORDER — ONDANSETRON HCL 4 MG/2ML IJ SOLN: 4.0000 mg | INTRAMUSCULAR | Status: DC | PRN

## 2013-03-10 MED ORDER — HYDROMORPHONE HCL PF 1 MG/ML IJ SOLN
INTRAMUSCULAR | Status: AC
  Filled 2013-03-10: qty 1

## 2013-03-10 MED ORDER — HYDROMORPHONE 0.3 MG/ML IV SOLN
INTRAVENOUS | Status: DC
  Administered 2013-03-10: 0.6 mg via INTRAVENOUS
  Administered 2013-03-10: 18:00:00 via INTRAVENOUS
  Administered 2013-03-11: 0.4 mg via INTRAVENOUS
  Administered 2013-03-11: 0.799 mg via INTRAVENOUS
  Administered 2013-03-11: 0.4 mg via INTRAVENOUS
  Administered 2013-03-11: 0.799 mg via INTRAVENOUS
  Administered 2013-03-11: 0.99 mg via INTRAVENOUS
  Administered 2013-03-12: 1.2 mg via INTRAVENOUS
  Administered 2013-03-12: 0.4 mg via INTRAVENOUS
  Administered 2013-03-12: 0.122 mg via INTRAVENOUS
  Administered 2013-03-12: 7.5 mg via INTRAVENOUS
  Administered 2013-03-12: 1.19 mg via INTRAVENOUS
  Administered 2013-03-13: 0.2 mg via INTRAVENOUS
  Administered 2013-03-13: 0.04 mg via INTRAVENOUS
  Filled 2013-03-10 (×2): qty 25

## 2013-03-10 MED ORDER — LIDOCAINE HCL (CARDIAC) 20 MG/ML IV SOLN
INTRAVENOUS | Status: DC | PRN
  Administered 2013-03-10: 80 mg via INTRAVENOUS

## 2013-03-10 MED ORDER — PHENOL 1.4 % MT LIQD: 1.0000 | OROMUCOSAL | Status: DC | PRN

## 2013-03-10 MED ORDER — ARTIFICIAL TEARS OP OINT
TOPICAL_OINTMENT | OPHTHALMIC | Status: DC | PRN
  Administered 2013-03-10: 1 via OPHTHALMIC

## 2013-03-10 MED ORDER — PROPOFOL 10 MG/ML IV BOLUS
INTRAVENOUS | Status: DC | PRN
  Administered 2013-03-10: 110 mg via INTRAVENOUS

## 2013-03-10 MED ORDER — HYDROCODONE-ACETAMINOPHEN 5-325 MG PO TABS
1.0000 | ORAL_TABLET | ORAL | Status: DC | PRN
  Administered 2013-03-10 – 2013-03-11 (×2): 2 via ORAL
  Administered 2013-03-13: 1 via ORAL
  Administered 2013-03-13: 2 via ORAL
  Administered 2013-03-13: 1 via ORAL
  Administered 2013-03-13: 2 via ORAL
  Administered 2013-03-14: 1 via ORAL
  Filled 2013-03-10 (×3): qty 2
  Filled 2013-03-10: qty 1
  Filled 2013-03-10: qty 2
  Filled 2013-03-10 (×2): qty 1

## 2013-03-10 MED ORDER — ASPIRIN 81 MG PO TABS: 81.0000 mg | ORAL_TABLET | Freq: Every day | ORAL | Status: DC

## 2013-03-10 MED ORDER — SODIUM CHLORIDE 0.9 % IV SOLN: 250.0000 mL | INTRAVENOUS | Status: DC

## 2013-03-10 MED ORDER — ZOLPIDEM TARTRATE 5 MG PO TABS: 5.0000 mg | ORAL_TABLET | Freq: Every evening | ORAL | Status: DC | PRN

## 2013-03-10 MED ORDER — METHOCARBAMOL 500 MG PO TABS
500.0000 mg | ORAL_TABLET | Freq: Four times a day (QID) | ORAL | Status: DC | PRN
  Administered 2013-03-10 – 2013-03-14 (×6): 500 mg via ORAL
  Filled 2013-03-10 (×7): qty 1

## 2013-03-10 MED ORDER — PANTOPRAZOLE SODIUM 40 MG PO TBEC
40.0000 mg | DELAYED_RELEASE_TABLET | ORAL | Status: DC
  Administered 2013-03-12 – 2013-03-14 (×2): 40 mg via ORAL
  Filled 2013-03-10 (×2): qty 1

## 2013-03-10 MED ORDER — LIDOCAINE-EPINEPHRINE 1 %-1:100000 IJ SOLN
INTRAMUSCULAR | Status: DC | PRN
  Administered 2013-03-10: 10 mL

## 2013-03-10 MED ORDER — DOCUSATE SODIUM 100 MG PO CAPS
100.0000 mg | ORAL_CAPSULE | Freq: Two times a day (BID) | ORAL | Status: DC
  Administered 2013-03-10 – 2013-03-14 (×8): 100 mg via ORAL
  Filled 2013-03-10 (×6): qty 1

## 2013-03-10 MED ORDER — KCL IN DEXTROSE-NACL 20-5-0.45 MEQ/L-%-% IV SOLN
INTRAVENOUS | Status: DC
  Administered 2013-03-10 – 2013-03-11 (×2): via INTRAVENOUS
  Filled 2013-03-10 (×9): qty 1000

## 2013-03-10 MED ORDER — MORPHINE SULFATE 2 MG/ML IJ SOLN: 1.0000 mg | INTRAMUSCULAR | Status: DC | PRN

## 2013-03-10 MED ORDER — ROCURONIUM BROMIDE 100 MG/10ML IV SOLN
INTRAVENOUS | Status: DC | PRN
  Administered 2013-03-10 (×4): 10 mg via INTRAVENOUS
  Administered 2013-03-10: 50 mg via INTRAVENOUS

## 2013-03-10 MED ORDER — ACETAMINOPHEN 325 MG PO TABS
650.0000 mg | ORAL_TABLET | ORAL | Status: DC | PRN
  Administered 2013-03-11: 650 mg via ORAL
  Filled 2013-03-10: qty 2

## 2013-03-10 MED ORDER — ACETAMINOPHEN 650 MG RE SUPP: 650.0000 mg | RECTAL | Status: DC | PRN

## 2013-03-10 MED ORDER — OMEPRAZOLE MAGNESIUM 20 MG PO TBEC: 10.0000 mg | DELAYED_RELEASE_TABLET | ORAL | Status: DC

## 2013-03-10 MED ORDER — SODIUM CHLORIDE 0.9 % IJ SOLN
3.0000 mL | Freq: Two times a day (BID) | INTRAMUSCULAR | Status: DC
  Administered 2013-03-11 – 2013-03-14 (×2): 3 mL via INTRAVENOUS

## 2013-03-10 MED ORDER — LACTATED RINGERS IV SOLN
INTRAVENOUS | Status: DC | PRN
  Administered 2013-03-10: 09:00:00 via INTRAVENOUS

## 2013-03-10 MED ORDER — THROMBIN 20000 UNITS EX SOLR
CUTANEOUS | Status: DC | PRN
  Administered 2013-03-10: 12:00:00 via TOPICAL

## 2013-03-10 MED ORDER — SODIUM CHLORIDE 0.9 % IJ SOLN: 9.0000 mL | INTRAMUSCULAR | Status: DC | PRN

## 2013-03-10 MED ORDER — SODIUM CHLORIDE 0.9 % IJ SOLN
10.0000 mL | INTRAMUSCULAR | Status: DC | PRN
  Administered 2013-03-11 – 2013-03-13 (×4): 20 mL

## 2013-03-10 MED ORDER — ALBUMIN HUMAN 5 % IV SOLN
INTRAVENOUS | Status: DC | PRN
  Administered 2013-03-10: 11:00:00 via INTRAVENOUS

## 2013-03-10 MED ORDER — OXYCODONE HCL 5 MG/5ML PO SOLN: 5.0000 mg | Freq: Once | ORAL | Status: AC | PRN

## 2013-03-10 MED ORDER — METHOCARBAMOL 100 MG/ML IJ SOLN
500.0000 mg | Freq: Four times a day (QID) | INTRAVENOUS | Status: DC | PRN
  Administered 2013-03-10: 500 mg via INTRAVENOUS
  Filled 2013-03-10: qty 5

## 2013-03-10 MED ORDER — FENTANYL CITRATE 0.05 MG/ML IJ SOLN
INTRAMUSCULAR | Status: DC | PRN
  Administered 2013-03-10 (×2): 50 ug via INTRAVENOUS
  Administered 2013-03-10: 100 ug via INTRAVENOUS
  Administered 2013-03-10 (×2): 50 ug via INTRAVENOUS
  Administered 2013-03-10: 100 ug via INTRAVENOUS
  Administered 2013-03-10 (×2): 50 ug via INTRAVENOUS

## 2013-03-10 MED ORDER — PHENYLEPHRINE HCL 10 MG/ML IJ SOLN
10.0000 mg | INTRAMUSCULAR | Status: DC | PRN
  Administered 2013-03-10: 10 ug/min via INTRAVENOUS

## 2013-03-10 MED ORDER — FLEET ENEMA 7-19 GM/118ML RE ENEM: 1.0000 | ENEMA | Freq: Once | RECTAL | Status: AC | PRN

## 2013-03-10 MED ORDER — HYDROMORPHONE HCL PF 1 MG/ML IJ SOLN
0.2500 mg | INTRAMUSCULAR | Status: DC | PRN
  Administered 2013-03-10 (×4): 0.5 mg via INTRAVENOUS

## 2013-03-10 MED ORDER — PANTOPRAZOLE SODIUM 40 MG IV SOLR
40.0000 mg | Freq: Every day | INTRAVENOUS | Status: DC
  Filled 2013-03-10: qty 40

## 2013-03-10 MED ORDER — EPHEDRINE SULFATE 50 MG/ML IJ SOLN
INTRAMUSCULAR | Status: DC | PRN
  Administered 2013-03-10: 10 mg via INTRAVENOUS
  Administered 2013-03-10 (×2): 5 mg via INTRAVENOUS

## 2013-03-10 MED ORDER — NEOSTIGMINE METHYLSULFATE 1 MG/ML IJ SOLN
INTRAMUSCULAR | Status: DC | PRN
  Administered 2013-03-10: 3 mg via INTRAVENOUS

## 2013-03-10 MED ORDER — GABAPENTIN 300 MG PO CAPS
300.0000 mg | ORAL_CAPSULE | Freq: Three times a day (TID) | ORAL | Status: DC
  Administered 2013-03-10: 300 mg via ORAL
  Filled 2013-03-10 (×4): qty 1

## 2013-03-10 MED ORDER — SODIUM CHLORIDE 0.9 % IJ SOLN
10.0000 mL | Freq: Two times a day (BID) | INTRAMUSCULAR | Status: DC
  Administered 2013-03-13: 10 mL

## 2013-03-10 MED ORDER — DIPHENHYDRAMINE HCL 12.5 MG/5ML PO ELIX
12.5000 mg | ORAL_SOLUTION | Freq: Four times a day (QID) | ORAL | Status: DC | PRN
Start: 2013-03-10 — End: 2013-03-13

## 2013-03-10 MED ORDER — ONDANSETRON HCL 4 MG/2ML IJ SOLN
4.0000 mg | Freq: Four times a day (QID) | INTRAMUSCULAR | Status: DC | PRN
  Administered 2013-03-10: 4 mg via INTRAVENOUS
  Filled 2013-03-10: qty 2

## 2013-03-10 MED ORDER — BUPIVACAINE HCL (PF) 0.5 % IJ SOLN
INTRAMUSCULAR | Status: DC | PRN
  Administered 2013-03-10: 10 mL

## 2013-03-10 MED ORDER — MIDAZOLAM HCL 5 MG/5ML IJ SOLN
INTRAMUSCULAR | Status: DC | PRN
  Administered 2013-03-10: 1 mg via INTRAVENOUS

## 2013-03-10 MED ORDER — HYDROCHLOROTHIAZIDE 12.5 MG PO CAPS
12.5000 mg | ORAL_CAPSULE | Freq: Every day | ORAL | Status: DC
  Administered 2013-03-10 – 2013-03-14 (×5): 12.5 mg via ORAL
  Filled 2013-03-10 (×5): qty 1

## 2013-03-10 MED ORDER — 0.9 % SODIUM CHLORIDE (POUR BTL) OPTIME
TOPICAL | Status: DC | PRN
  Administered 2013-03-10 (×2): 1000 mL

## 2013-03-10 MED ORDER — DIPHENHYDRAMINE HCL 50 MG/ML IJ SOLN: 12.5000 mg | Freq: Four times a day (QID) | INTRAMUSCULAR | Status: DC | PRN

## 2013-03-10 MED ORDER — NALOXONE HCL 0.4 MG/ML IJ SOLN: 0.4000 mg | INTRAMUSCULAR | Status: DC | PRN

## 2013-03-10 MED ORDER — ONDANSETRON HCL 4 MG/2ML IJ SOLN: 4.0000 mg | Freq: Once | INTRAMUSCULAR | Status: DC | PRN

## 2013-03-10 MED ORDER — ASPIRIN EC 81 MG PO TBEC
81.0000 mg | DELAYED_RELEASE_TABLET | Freq: Every day | ORAL | Status: DC
  Administered 2013-03-12 – 2013-03-14 (×3): 81 mg via ORAL
  Filled 2013-03-10 (×5): qty 1

## 2013-03-10 MED ORDER — GLYCOPYRROLATE 0.2 MG/ML IJ SOLN
INTRAMUSCULAR | Status: DC | PRN
  Administered 2013-03-10: 0.4 mg via INTRAVENOUS

## 2013-03-10 MED ORDER — SENNA 8.6 MG PO TABS
1.0000 | ORAL_TABLET | Freq: Two times a day (BID) | ORAL | Status: DC
  Administered 2013-03-10 – 2013-03-14 (×8): 8.6 mg via ORAL
  Filled 2013-03-10 (×10): qty 1

## 2013-03-10 MED ORDER — OXYCODONE-ACETAMINOPHEN 5-325 MG PO TABS: 1.0000 | ORAL_TABLET | ORAL | Status: DC | PRN

## 2013-03-10 MED ORDER — OXYCODONE HCL 5 MG PO TABS
ORAL_TABLET | ORAL | Status: AC
  Filled 2013-03-10: qty 1

## 2013-03-10 MED ORDER — CEFAZOLIN SODIUM 1-5 GM-% IV SOLN
1.0000 g | Freq: Three times a day (TID) | INTRAVENOUS | Status: AC
  Administered 2013-03-10 – 2013-03-11 (×2): 1 g via INTRAVENOUS
  Filled 2013-03-10 (×2): qty 50

## 2013-03-10 MED ORDER — SODIUM CHLORIDE 0.9 % IJ SOLN: 3.0000 mL | INTRAMUSCULAR | Status: DC | PRN

## 2013-03-10 MED ORDER — ONDANSETRON HCL 4 MG/2ML IJ SOLN
INTRAMUSCULAR | Status: DC | PRN
  Administered 2013-03-10: 4 mg via INTRAVENOUS

## 2013-03-10 MED ORDER — BISACODYL 10 MG RE SUPP: 10.0000 mg | Freq: Every day | RECTAL | Status: DC | PRN

## 2013-03-10 MED ORDER — LACTATED RINGERS IV SOLN
INTRAVENOUS | Status: DC
  Administered 2013-03-10 (×3): via INTRAVENOUS

## 2013-03-10 MED ORDER — SIMVASTATIN 40 MG PO TABS
40.0000 mg | ORAL_TABLET | Freq: Every day | ORAL | Status: DC
  Administered 2013-03-10 – 2013-03-14 (×5): 40 mg via ORAL
  Filled 2013-03-10 (×5): qty 1

## 2013-03-10 MED ORDER — MENTHOL 3 MG MT LOZG: 1.0000 | LOZENGE | OROMUCOSAL | Status: DC | PRN

## 2013-03-10 MED ORDER — OXYCODONE HCL 5 MG PO TABS
5.0000 mg | ORAL_TABLET | Freq: Once | ORAL | Status: AC | PRN
  Administered 2013-03-10: 5 mg via ORAL

## 2013-03-10 MED ORDER — POLYETHYLENE GLYCOL 3350 17 G PO PACK
17.0000 g | PACK | Freq: Every day | ORAL | Status: DC | PRN
  Filled 2013-03-10: qty 1

## 2013-03-10 SURGICAL SUPPLY — 101 items
ADH SKN CLS APL DERMABOND .7 (GAUZE/BANDAGES/DRESSINGS)
ADH SKN CLS LQ APL DERMABOND (GAUZE/BANDAGES/DRESSINGS) ×3
APL SKNCLS STERI-STRIP NONHPOA (GAUZE/BANDAGES/DRESSINGS)
BAG DECANTER FOR FLEXI CONT (MISCELLANEOUS) ×2 IMPLANT
BENZOIN TINCTURE PRP APPL 2/3 (GAUZE/BANDAGES/DRESSINGS) IMPLANT
BIT DRILL NEURO 2X3.1 SFT TUCH (MISCELLANEOUS) ×1 IMPLANT
BLADE SURG ROTATE 9660 (MISCELLANEOUS) IMPLANT
BONE MATRIX OSTEOCEL PRO MED (Bone Implant) ×2 IMPLANT
BONE MATRIX OSTEOCEL PRO SM (Bone Implant) ×3 IMPLANT
BUR MATCHSTICK NEURO 3.0 LAGG (BURR) ×2 IMPLANT
BUR PRECISION FLUTE 5.0 (BURR) ×2 IMPLANT
BUR ROUND FLUTED 5 RND (BURR) ×3 IMPLANT
CANISTER SUCT 3000ML (MISCELLANEOUS) ×2 IMPLANT
CONT SPEC 4OZ CLIKSEAL STRL BL (MISCELLANEOUS) ×4 IMPLANT
COVER BACK TABLE 24X17X13 BIG (DRAPES) IMPLANT
COVER TABLE BACK 60X90 (DRAPES) ×2 IMPLANT
DERMABOND ADHESIVE PROPEN (GAUZE/BANDAGES/DRESSINGS) ×3
DERMABOND ADVANCED (GAUZE/BANDAGES/DRESSINGS)
DERMABOND ADVANCED .7 DNX12 (GAUZE/BANDAGES/DRESSINGS) ×2 IMPLANT
DERMABOND ADVANCED .7 DNX6 (GAUZE/BANDAGES/DRESSINGS) IMPLANT
DRAPE C-ARM 42X72 X-RAY (DRAPES) ×4 IMPLANT
DRAPE LAPAROTOMY 100X72X124 (DRAPES) ×3 IMPLANT
DRAPE MICROSCOPE LEICA (MISCELLANEOUS) ×1 IMPLANT
DRAPE POUCH INSTRU U-SHP 10X18 (DRAPES) ×3 IMPLANT
DRAPE SURG 17X23 STRL (DRAPES) ×2 IMPLANT
DRESSING TELFA 8X3 (GAUZE/BANDAGES/DRESSINGS) IMPLANT
DRILL NEURO 2X3.1 SOFT TOUCH (MISCELLANEOUS) ×2
DRSG OPSITE POSTOP 4X10 (GAUZE/BANDAGES/DRESSINGS) ×1 IMPLANT
DURAPREP 26ML APPLICATOR (WOUND CARE) ×3 IMPLANT
ELECT REM PT RETURN 9FT ADLT (ELECTROSURGICAL) ×2
ELECTRODE REM PT RTRN 9FT ADLT (ELECTROSURGICAL) ×2 IMPLANT
EVACUATOR 1/8 PVC DRAIN (DRAIN) ×1 IMPLANT
GAUZE SPONGE 4X4 16PLY XRAY LF (GAUZE/BANDAGES/DRESSINGS) ×1 IMPLANT
GLOVE BIO SURGEON STRL SZ8 (GLOVE) ×5 IMPLANT
GLOVE BIOGEL PI IND STRL 6.5 (GLOVE) IMPLANT
GLOVE BIOGEL PI IND STRL 7.5 (GLOVE) IMPLANT
GLOVE BIOGEL PI IND STRL 8 (GLOVE) ×3 IMPLANT
GLOVE BIOGEL PI IND STRL 8.5 (GLOVE) ×3 IMPLANT
GLOVE BIOGEL PI INDICATOR 6.5 (GLOVE) ×1
GLOVE BIOGEL PI INDICATOR 7.5 (GLOVE) ×4
GLOVE BIOGEL PI INDICATOR 8 (GLOVE) ×2
GLOVE BIOGEL PI INDICATOR 8.5 (GLOVE) ×2
GLOVE ECLIPSE 7.5 STRL STRAW (GLOVE) ×9 IMPLANT
GLOVE ECLIPSE 8.0 STRL XLNG CF (GLOVE) ×4 IMPLANT
GLOVE EXAM NITRILE LRG STRL (GLOVE) IMPLANT
GLOVE EXAM NITRILE MD LF STRL (GLOVE) IMPLANT
GLOVE EXAM NITRILE XL STR (GLOVE) IMPLANT
GLOVE EXAM NITRILE XS STR PU (GLOVE) IMPLANT
GLOVE SURG SS PI 6.5 STRL IVOR (GLOVE) ×2 IMPLANT
GOWN BRE IMP SLV AUR LG STRL (GOWN DISPOSABLE) IMPLANT
GOWN BRE IMP SLV AUR XL STRL (GOWN DISPOSABLE) ×3 IMPLANT
GOWN STRL REIN 2XL LVL4 (GOWN DISPOSABLE) IMPLANT
GOWN STRL REUS W/ TWL LRG LVL3 (GOWN DISPOSABLE) IMPLANT
GOWN STRL REUS W/ TWL XL LVL3 (GOWN DISPOSABLE) IMPLANT
GOWN STRL REUS W/TWL 2XL LVL3 (GOWN DISPOSABLE) ×2 IMPLANT
GOWN STRL REUS W/TWL LRG LVL3 (GOWN DISPOSABLE) ×14
GOWN STRL REUS W/TWL XL LVL3 (GOWN DISPOSABLE) ×8
KIT BASIN OR (CUSTOM PROCEDURE TRAY) ×3 IMPLANT
KIT POSITION SURG JACKSON T1 (MISCELLANEOUS) ×2 IMPLANT
KIT ROOM TURNOVER OR (KITS) ×3 IMPLANT
MILL MEDIUM DISP (BLADE) ×2 IMPLANT
NDL HYPO 25X1 1.5 SAFETY (NEEDLE) ×2 IMPLANT
NDL SPNL 18GX3.5 QUINCKE PK (NEEDLE) IMPLANT
NEEDLE HYPO 25X1 1.5 SAFETY (NEEDLE) ×2 IMPLANT
NEEDLE SPNL 18GX3.5 QUINCKE PK (NEEDLE) ×2 IMPLANT
NS IRRIG 1000ML POUR BTL (IV SOLUTION) ×4 IMPLANT
PACK LAMINECTOMY NEURO (CUSTOM PROCEDURE TRAY) ×3 IMPLANT
PAD ARMBOARD 7.5X6 YLW CONV (MISCELLANEOUS) ×8 IMPLANT
PATTIES SURGICAL .5 X.5 (GAUZE/BANDAGES/DRESSINGS) IMPLANT
PATTIES SURGICAL .5 X1 (DISPOSABLE) IMPLANT
PATTIES SURGICAL 1X1 (DISPOSABLE) IMPLANT
ROD ARM15T 50MM (Rod) ×1 IMPLANT
ROD TI 5.5X70 (Rod) ×2 IMPLANT
SCREW ARMADA 6.5X50 (Screw) ×4 IMPLANT
SCREW LOCK (Screw) ×12 IMPLANT
SCREW LOCK 100X5.5X OPN (Screw) IMPLANT
SCREW POLY 45X6.5 (Screw) IMPLANT
SCREW POLY 6.5X45MM (Screw) ×4 IMPLANT
SPONGE GAUZE 4X4 12PLY (GAUZE/BANDAGES/DRESSINGS) ×1 IMPLANT
SPONGE LAP 4X18 X RAY DECT (DISPOSABLE) ×2 IMPLANT
SPONGE SURGIFOAM ABS GEL 100 (HEMOSTASIS) ×2 IMPLANT
SPONGE SURGIFOAM ABS GEL SZ50 (HEMOSTASIS) IMPLANT
STAPLER SKIN PROX WIDE 3.9 (STAPLE) ×1 IMPLANT
STRIP CLOSURE SKIN 1/2X4 (GAUZE/BANDAGES/DRESSINGS) ×2 IMPLANT
SUT PROLENE 6 0 BV (SUTURE) ×1 IMPLANT
SUT VIC AB 0 CT1 18XCR BRD8 (SUTURE) ×1 IMPLANT
SUT VIC AB 0 CT1 8-18 (SUTURE)
SUT VIC AB 1 CT1 18XBRD ANBCTR (SUTURE) ×2 IMPLANT
SUT VIC AB 1 CT1 8-18 (SUTURE) ×4
SUT VIC AB 2-0 CT1 18 (SUTURE) ×5 IMPLANT
SUT VIC AB 3-0 SH 8-18 (SUTURE) ×6 IMPLANT
SYR 20CC LL (SYRINGE) ×2 IMPLANT
SYR 20ML ECCENTRIC (SYRINGE) ×2 IMPLANT
SYR 3ML LL SCALE MARK (SYRINGE) ×8 IMPLANT
SYR 5ML LL (SYRINGE) IMPLANT
TOWEL OR 17X24 6PK STRL BLUE (TOWEL DISPOSABLE) ×3 IMPLANT
TOWEL OR 17X26 10 PK STRL BLUE (TOWEL DISPOSABLE) ×3 IMPLANT
TRAP SPECIMEN MUCOUS 40CC (MISCELLANEOUS) ×2 IMPLANT
TRAY FOLEY CATH 14FRSI W/METER (CATHETERS) ×1 IMPLANT
TRAY FOLEY CATH 16FRSI W/METER (SET/KITS/TRAYS/PACK) ×1 IMPLANT
WATER STERILE IRR 1000ML POUR (IV SOLUTION) ×3 IMPLANT

## 2013-03-10 NOTE — Preoperative (Signed)
Beta Blockers   Reason not to administer Beta Blockers:Not Applicable 

## 2013-03-10 NOTE — Transfer of Care (Signed)
Immediate Anesthesia Transfer of Care Note  Patient: Jason Cross  Procedure(s) Performed: Procedure(s) with comments: Lumbar three-four, Lumbar four-five Redo decompression/Fusion (N/A) - Lumbar three-four, Lumbar four-five Redo decompression/Fusion Thoracic twelve-Lumbar one Laminectomy (N/A) - Thoracic twelve-Lumbar one Laminectomy  Patient Location: PACU  Anesthesia Type:General  Level of Consciousness: awake, alert  and oriented  Airway & Oxygen Therapy: Patient Spontanous Breathing and Patient connected to face mask oxygen  Post-op Assessment: Report given to PACU RN  Post vital signs: Reviewed and stable  Complications: No apparent anesthesia complications

## 2013-03-10 NOTE — Interval H&P Note (Signed)
History and Physical Interval Note:  03/10/2013 10:09 AM  Jason Cross  has presented today for surgery, with the diagnosis of Stenosis, Scoliosis, Radiculopathy, Thoracic spondylosis  The various methods of treatment have been discussed with the patient and family. After consideration of risks, benefits and other options for treatment, the patient has consented to  Procedure(s) with comments: L3-4 L4-5 Redo decompression/Fusion (N/A) - L3-4 L4-5 Redo decompression/Fusion T12-L1 Laminectomy (N/A) - T12-L1 Laminectomy as a surgical intervention .  The patient's history has been reviewed, patient examined, no change in status, stable for surgery.  I have reviewed the patient's chart and labs.  Questions were answered to the patient's satisfaction.     Lalena Salas D

## 2013-03-10 NOTE — Anesthesia Postprocedure Evaluation (Signed)
  Anesthesia Post-op Note  Patient: Jason Cross  Procedure(s) Performed: Procedure(s) with comments: Lumbar three-four, Lumbar four-five Redo decompression/Fusion (N/A) - Lumbar three-four, Lumbar four-five Redo decompression/Fusion Thoracic twelve-Lumbar one Laminectomy (N/A) - Thoracic twelve-Lumbar one Laminectomy  Patient Location: PACU  Anesthesia Type:General  Level of Consciousness: awake, alert  and oriented  Airway and Oxygen Therapy: Patient Spontanous Breathing and Patient connected to face mask oxygen  Post-op Pain: mild  Post-op Assessment: Post-op Vital signs reviewed  Post-op Vital Signs: Reviewed  Complications: No apparent anesthesia complications

## 2013-03-10 NOTE — Progress Notes (Signed)
Awake, alert, conversant.  Moving all extremities well. Full strength both lower extremities. Doing well.

## 2013-03-10 NOTE — Progress Notes (Signed)
Pt. Arrived to unit in severe pain; has had a total of 2mg  Dilaudid, Robaxin 500mg  and 5mg  Oxycodone all with no relief in PACU. Dr. Joya Salm notified; orders received to start Reduced Dose Dilaudid PCA. Dilaudid PCA started with pt. Instructed on how to use; will monitor.

## 2013-03-10 NOTE — Anesthesia Preprocedure Evaluation (Addendum)
Anesthesia Evaluation  Patient identified by MRN, date of birth, ID band Patient awake    Reviewed: Allergy & Precautions, H&P , NPO status , Patient's Chart, lab work & pertinent test results, reviewed documented beta blocker date and time   Airway Mallampati: II TM Distance: >3 FB Neck ROM: Full    Dental  (+) Edentulous Upper and Edentulous Lower   Pulmonary sleep apnea and Continuous Positive Airway Pressure Ventilation , COPDformer smoker,  breath sounds clear to auscultation  Pulmonary exam normal       Cardiovascular hypertension, Pt. on medications + CAD, + Past MI and + CABG Rhythm:Regular Rate:Normal     Neuro/Psych    GI/Hepatic PUD, GERD-  Medicated and Controlled,  Endo/Other    Renal/GU Renal InsufficiencyRenal disease     Musculoskeletal   Abdominal Normal abdominal exam  (+)   Peds  Hematology   Anesthesia Other Findings   Reproductive/Obstetrics                         Anesthesia Physical Anesthesia Plan  ASA: III  Anesthesia Plan: General   Post-op Pain Management:    Induction: Intravenous  Airway Management Planned: Oral ETT  Additional Equipment: Arterial line and CVP  Intra-op Plan:   Post-operative Plan: Extubation in OR and Possible Post-op intubation/ventilation  Informed Consent: I have reviewed the patients History and Physical, chart, labs and discussed the procedure including the risks, benefits and alternatives for the proposed anesthesia with the patient or authorized representative who has indicated his/her understanding and acceptance.   Dental advisory given  Plan Discussed with: CRNA, Anesthesiologist and Surgeon  Anesthesia Plan Comments:         Anesthesia Quick Evaluation

## 2013-03-10 NOTE — Op Note (Signed)
03/10/2013  3:11 PM  PATIENT:  Jason Cross  80 y.o. 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, Scoliosis, Radiculopathy, Thoracic stenosis T 12 - L1   PROCEDURE:  Procedure(s) with comments: Lumbar three-four, Lumbar four-five Redo decompression/Fusion (N/A) - Lumbar three-four, Lumbar four-five Redo decompression/Fusion Thoracic twelve-Lumbar one Laminectomy (N/A) - Thoracic twelve-Lumbar one Laminectomy with Pedicle screw fixation  L 3 - L 5 levels with posterolateral arthrodesis   SURGEON:  Surgeon(s) and Role:    * Erline Levine, MD - Primary    * Otilio Connors, MD - Assisting  PHYSICIAN ASSISTANT:   ASSISTANTS: Poteat, RN   ANESTHESIA:   general  EBL:  Total I/O In: 2850 [I.V.:2600; IV Piggyback:250] Out: 800 [Urine:500; Blood:300]  BLOOD ADMINISTERED:none  DRAINS: (Medium) Hemovact drain(s) in the epidural space with  Suction Open   LOCAL MEDICATIONS USED:  LIDOCAINE   SPECIMEN:  No Specimen  DISPOSITION OF SPECIMEN:  N/A  COUNTS:  YES  TOURNIQUET:  * No tourniquets in log *  DICTATION: Patient is 80 year old woman with spondylosis, scoliosis, recurrent stenosis, DDD, radiculopathy L 34, L 45 and stenosis at T 12 - L 1 levels. He has  severe right greater than left leg pain and weakness. It was elected to take him to surgery for redo decompression and fusion at L 34 and L 45 levels with fusion with decompression at T 12 - L 1 levels.    Procedure: Patient was placed in a prone position on the Seymour table after smooth and uncomplicated induction of general endotracheal anesthesia. His low back was prepped and draped in usual sterile fashion with betadine scrub and DuraPrep. Area of incision was infiltrated with local lidocaine. Incision was made to the lumbodorsal fascia was incised and exposure was performed of the L  3, L 4 an L 45 spinous processes laminae facet joint and transverse processes. Intraoperative x-ray was obtained which confirmed correct orientation. A separate incision was made at T 12 - L  1 levels and a total laminectomy of T 12 and L 1 was performed with thorough decompression of the spinal canal and dura from T 12 - L 1 levels. . This level was closed after the remainder of the surgery. A total laminectomy of L3 and L5 was performed and previous total laminectomy of L 4 was further decompressed with disarticulation of the facet joints at this level and thorough decompression was performed of both L3, L4, L5 nerve roots along with the common dural tube. There was densely adherent spondylytic material compressing the thecal sac and both L3, L 4 and L 5 nerve roots.  Decompression was greater than would be typically performed for simple interbody fusion. A thorough discectomy and preparation of the endplates was performed at both the L 34 and L 45 levels.    The posterolateral region was extensively decorticated and pedicle probes were placed at L 3, L 4 , L5 bilaterally. Intraoperative fluoroscopy confirmed correct orientationin the AP and lateral plane. 50 x 6.5 mm pedicle screws were placed at L 5 bilaterally and 45 x 6.5 mm screws placed at L4 bilaterally and  50 x 6.32mm screws were placed at L3 bilaterally.  Final x-rays demonstrated well-positioned interbody grafts and pedicle screw fixation. 70 mm lordotic rods were placed and locked down in situ and the posterolateral region was packed with the  40 cc of bone autograft and Osteocel Plus on each side.  A medium Hemovac drain was placed and anchored with a stitch.  Fascia was closed with 1 Vicryl sutures skin edges were reapproximated 2 and 3-0 Vicryl sutures. The wound is dressed with Dermabond and an occlusive dressing. The patient was extubated in the operating room and taken to recovery in stable satisfactory condition having tolerated the operation  well. Counts were correct at the end of the case.    PLAN OF CARE: Admit to inpatient   PATIENT DISPOSITION:  PACU - hemodynamically stable.   Delay start of Pharmacological VTE agent (>24hrs) due to surgical blood loss or risk of bleeding: yes  

## 2013-03-10 NOTE — Brief Op Note (Signed)
03/10/2013  3:11 PM  PATIENT:  Jason Cross  80 y.o. 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, Scoliosis, Radiculopathy, Thoracic stenosis T 12 - L1   PROCEDURE:  Procedure(s) with comments: Lumbar three-four, Lumbar four-five Redo decompression/Fusion (N/A) - Lumbar three-four, Lumbar four-five Redo decompression/Fusion Thoracic twelve-Lumbar one Laminectomy (N/A) - Thoracic twelve-Lumbar one Laminectomy with Pedicle screw fixation  L 3 - L 5 levels with posterolateral arthrodesis   SURGEON:  Surgeon(s) and Role:    * Erline Levine, MD - Primary    * Otilio Connors, MD - Assisting  PHYSICIAN ASSISTANT:   ASSISTANTS: Poteat, RN   ANESTHESIA:   general  EBL:  Total I/O In: 2850 [I.V.:2600; IV Piggyback:250] Out: 800 [Urine:500; Blood:300]  BLOOD ADMINISTERED:none  DRAINS: (Medium) Hemovact drain(s) in the epidural space with  Suction Open   LOCAL MEDICATIONS USED:  LIDOCAINE   SPECIMEN:  No Specimen  DISPOSITION OF SPECIMEN:  N/A  COUNTS:  YES  TOURNIQUET:  * No tourniquets in log *  DICTATION: Patient is 80 year old woman with spondylosis, scoliosis, recurrent stenosis, DDD, radiculopathy L 34, L 45 and stenosis at T 12 - L 1 levels. He has  severe right greater than left leg pain and weakness. It was elected to take him to surgery for redo decompression and fusion at L 34 and L 45 levels with fusion with decompression at T 12 - L 1 levels.    Procedure: Patient was placed in a prone position on the Seymour table after smooth and uncomplicated induction of general endotracheal anesthesia. His low back was prepped and draped in usual sterile fashion with betadine scrub and DuraPrep. Area of incision was infiltrated with local lidocaine. Incision was made to the lumbodorsal fascia was incised and exposure was performed of the L  3, L 4 an L 45 spinous processes laminae facet joint and transverse processes. Intraoperative x-ray was obtained which confirmed correct orientation. A separate incision was made at T 12 - L  1 levels and a total laminectomy of T 12 and L 1 was performed with thorough decompression of the spinal canal and dura from T 12 - L 1 levels. . This level was closed after the remainder of the surgery. A total laminectomy of L3 and L5 was performed and previous total laminectomy of L 4 was further decompressed with disarticulation of the facet joints at this level and thorough decompression was performed of both L3, L4, L5 nerve roots along with the common dural tube. There was densely adherent spondylytic material compressing the thecal sac and both L3, L 4 and L 5 nerve roots.  Decompression was greater than would be typically performed for simple interbody fusion. A thorough discectomy and preparation of the endplates was performed at both the L 34 and L 45 levels.    The posterolateral region was extensively decorticated and pedicle probes were placed at L 3, L 4 , L5 bilaterally. Intraoperative fluoroscopy confirmed correct orientationin the AP and lateral plane. 50 x 6.5 mm pedicle screws were placed at L 5 bilaterally and 45 x 6.5 mm screws placed at L4 bilaterally and  50 x 6.32mm screws were placed at L3 bilaterally.  Final x-rays demonstrated well-positioned interbody grafts and pedicle screw fixation. 70 mm lordotic rods were placed and locked down in situ and the posterolateral region was packed with the  40 cc of bone autograft and Osteocel Plus on each side.  A medium Hemovac drain was placed and anchored with a stitch.  Fascia was closed with 1 Vicryl sutures skin edges were reapproximated 2 and 3-0 Vicryl sutures. The wound is dressed with Dermabond and an occlusive dressing. The patient was extubated in the operating room and taken to recovery in stable satisfactory condition having tolerated the operation  well. Counts were correct at the end of the case.    PLAN OF CARE: Admit to inpatient   PATIENT DISPOSITION:  PACU - hemodynamically stable.   Delay start of Pharmacological VTE agent (>24hrs) due to surgical blood loss or risk of bleeding: yes

## 2013-03-11 MED ORDER — GABAPENTIN 100 MG PO CAPS
200.0000 mg | ORAL_CAPSULE | Freq: Three times a day (TID) | ORAL | Status: DC
  Filled 2013-03-11 (×3): qty 2

## 2013-03-11 MED ORDER — DEXAMETHASONE SODIUM PHOSPHATE 4 MG/ML IJ SOLN
4.0000 mg | Freq: Four times a day (QID) | INTRAMUSCULAR | Status: AC
  Administered 2013-03-11 – 2013-03-13 (×8): 4 mg via INTRAVENOUS
  Filled 2013-03-11 (×8): qty 1

## 2013-03-11 MED ORDER — GABAPENTIN 300 MG PO CAPS
300.0000 mg | ORAL_CAPSULE | Freq: Three times a day (TID) | ORAL | Status: DC
  Filled 2013-03-11 (×3): qty 1

## 2013-03-11 MED ORDER — GABAPENTIN 100 MG PO CAPS
200.0000 mg | ORAL_CAPSULE | Freq: Three times a day (TID) | ORAL | Status: DC
  Administered 2013-03-11 – 2013-03-14 (×10): 200 mg via ORAL
  Filled 2013-03-11 (×14): qty 2

## 2013-03-11 NOTE — Evaluation (Signed)
Physical Therapy Evaluation Patient Details Name: Jason Cross MRN: [id number] DOB: 11/01/1928 Today's Date: 03/11/2013 Time: 6659-9357 PT Time Calculation (min): 48 min  PT Assessment / Plan / Recommendation History of Present Illness  Lumbar three-four, Lumbar four-five Redo decompression/Fusion, Thoracic twelve-Lumbar one Laminectomy PMHx peripheral neuropathy, CAD/CABG, neck and back surgery  Clinical Impression  Pt able to stand x 3 with +2 assist from elevated bed, however legs were too weak and numb to attempt stand-pivot to chair. Patient is s/p above surgery resulting in the deficits listed below (see PT Problem List).  Patient will benefit from skilled PT to increase their independence and safety with mobility (while adhering to their precautions) to allow discharge       PT Assessment  Patient needs continued PT services    Follow Up Recommendations  CIR    Does the patient have the potential to tolerate intense rehabilitation      Barriers to Discharge Inaccessible home environment;Decreased caregiver support wife can provide min assist    Equipment Recommendations  Rolling walker with 5" wheels    Recommendations for Other Services Rehab consult;OT consult   Frequency 7X/week    Precautions / Restrictions Precautions Precautions: Back;Fall Precaution Booklet Issued: Yes (comment) Precaution Comments: pt able to recall 3/3 precautions with cues Required Braces or Orthoses: Spinal Brace Spinal Brace: Lumbar corset;Applied in sitting position   Pertinent Vitals/Pain 9/10 back and thigh pain in supine; 5/10 in sitting; 4/10 on return to supine Pt encouraged to use PCA at beginning of session      Mobility  Bed Mobility Overal bed mobility: Needs Assistance Bed Mobility: Rolling;Sidelying to Sit;Sit to Sidelying Rolling: Min assist Sidelying to sit: Mod assist Sit to sidelying: Mod assist General bed mobility comments: vc for technique to maintain back  precautions; using rail; limited by pain and weakness Transfers Overall transfer level: Needs assistance Equipment used: Rolling walker (2 wheeled) Transfers: Sit to/from Stand Sit to Stand: Mod assist;From elevated surface;+2 physical assistance General transfer comment: x3; bed elevated ~6 inches; wife assisted with anchoring RW as pt used one hand on RW and one on bed rail; pt unable to straighten legs to full standing,but once bil UEs on RW he was able to push up through UEs to full standing Ambulation/Gait Ambulation/Gait assistance: Mod assist;+2 physical assistance Ambulation Distance (Feet): 1 Feet Assistive device: Rolling walker (2 wheeled) General Gait Details: side-stepped to his Rt towards HOB x 4 small steps    Exercises     PT Diagnosis: Difficulty walking;Acute pain  PT Problem List: Decreased strength;Decreased activity tolerance;Decreased balance;Decreased mobility;Decreased knowledge of use of DME;Decreased knowledge of precautions;Impaired sensation;Pain PT Treatment Interventions: DME instruction;Gait training;Stair training;Functional mobility training;Therapeutic activities;Therapeutic exercise;Balance training;Patient/family education     PT Goals(Current goals can be found in the care plan section) Acute Rehab PT Goals Patient Stated Goal: get stronger and walk PT Goal Formulation: With patient Time For Goal Achievement: 03/18/13 Potential to Achieve Goals: Good  Visit Information  Last PT Received On: 03/11/13 Assistance Needed: +2 History of Present Illness: Lumbar three-four, Lumbar four-five Redo decompression/Fusion, Thoracic twelve-Lumbar one Laminectomy PMHx peripheral neuropathy, CAD/CABG, neck and back surgery       [place] expects to be discharged to:: Private residence Living Arrangements: Spouse/significant other;Other relatives (also 55 yo grandaughter 3 days/week) Available Help at Discharge:  Family;Available 24 hours/day Type of Home: House Home Access: Stairs to enter [employer] of Steps: 4 Entrance Stairs-Rails: Right;Left Home Layout: One level Home  Equipment: Cane - single point;Walker - standard;Bedside commode;Other (comment) (one std one handicap height toilet) Prior Function Level of Independence: Independent with assistive device(s) Comments: used cane at times; able to ambulate community distances PTA Communication Communication: HOH    Cognition  Cognition Arousal/Alertness: [employer] fast streamer;Suspect due to medications Behavior During Therapy: Flat affect Overall Cognitive Status: Within Functional Limits for tasks assessed    Extremity/Trunk Assessment Upper Extremity Assessment Upper Extremity Assessment: Defer to OT evaluation;Overall WFL for tasks assessed Lower Extremity Assessment Lower Extremity Assessment: RLE deficits/detail;LLE deficits/detail RLE Deficits / Details: AROM WFL; knee extension3+ RLE Sensation: history of peripheral neuropathy;decreased light touch (reports numbness in thighs is worse) LLE Deficits / Details: AROM WFL; knee extension 3+ LLE Sensation: history of peripheral neuropathy;decreased light touch (reports numbness in thighs is worse) Cervical / Trunk Assessment Cervical / Trunk Assessment: Normal   Balance Balance Overall balance assessment: Needs assistance Sitting-balance support: Bilateral upper extremity supported;Feet supported Sitting balance-Leahy Scale: Poor Postural control: Posterior lean Standing balance support: Bilateral upper extremity supported Standing balance-Leahy Scale: Poor Standing balance comment: legs felt "rubbery" and with notable shaking on 2nd and 3rd time standing  End of Session PT - End of Session Equipment Utilized During Treatment: Gait belt;Back brace;Oxygen Activity Tolerance: Treatment limited secondary to medical complications (Comment) (leg weakness/numbness; decr SaO2) Patient  left: in bed;with call bell/phone within reach;with bed alarm set;with family/visitor present Nurse Communication: Mobility status  GP     Jason Cross 03/11/2013, 11:51 AM Pager [phone]

## 2013-03-11 NOTE — Progress Notes (Signed)
Pt only voided 185ml since foley removed. Bladder scan performed revealing 720ml. In and out cath attempted unsuccessfully. MD notified. Orders for coude catheter given.

## 2013-03-11 NOTE — Progress Notes (Signed)
OT Cancellation Note  Patient Details Name: Jason Cross MRN: [id number] DOB: 21-May-1928   Cancelled Treatment:    Reason Eval/Treat Not Completed: Other (comment). Per PT, pt having difficulty mobilizing today. Feel OT assessment will be more beneficial if completed in am.   Benito Mccreedy OTR/L [phone] 03/11/2013, 1:08 PM

## 2013-03-11 NOTE — Progress Notes (Signed)
Subjective: Patient reports complaining of pain in back and both legs  Objective: Vital signs in last 24 hours: Temp:  [97.1 F (36.2 C)-98.3 F (36.8 C)] 98.3 F (36.8 C) (01/17 0601) Pulse Rate:  [65-104] 97 (01/17 0601) Resp:  [12-23] 20 (01/17 0601) BP: (104-152)/(45-93) 142/58 mmHg (01/17 0601) SpO2:  [94 %-100 %] 100 % (01/17 0601) Arterial Line BP: (136-174)/(60-76) 170/65 mmHg (01/16 1600) Weight:  [82.5 kg (181 lb 14.1 oz)] 82.5 kg (181 lb 14.1 oz) (01/16 1700)  Intake/Output from previous day: 01/16 0701 - 01/17 0700 In: 4207.5 [P.O.:480; I.V.:3477.5; IV Piggyback:250] Out: 2350 [Urine:1450; Drains:600; Blood:300] Intake/Output this shift:    Physical Exam: Dressing CDI.  Strength full both legs all groups.  Lab Results: No results found for this basename: WBC, HGB, HCT, PLT,  in the last 72 hours BMET No results found for this basename: NA, K, CL, CO2, GLUCOSE, BUN, CREATININE, CALCIUM,  in the last 72 hours  Studies/Results: Dg Lumbar Spine 2-3 Views  03/10/2013   CLINICAL DATA:  Lumbar fusion  EXAM: LUMBAR SPINE - 2-3 VIEW; DG C-ARM 1-60 MIN  COMPARISON:  Earlier film of the same day  FINDINGS: Three fluoroscopic intraoperative images document bilateral pedicle screw placement L3, L4, and L5.  IMPRESSION: Changes of posterior fusion L3-L5.   Electronically Signed   By: Arne Cleveland M.D.   On: 03/10/2013 15:32   Dg Lumbar Spine 2-3 Views  03/10/2013   CLINICAL DATA:  L3-5 decompression.  T12-L1 laminectomy.  EXAM: LUMBAR SPINE - 2-3 VIEW  COMPARISON:  MRI lumbar spine 12/12/2012.  FINDINGS: We are provided with 2 intraoperative views of the lumbar spine in the lateral projection. On film labeled number 1, the superior most probe is at the level of the T12-L1 foramina. Probes are also seen at the level of the L3, L4 and L5 pedicles. On the second image, a probe is at the level of the superior endplate of L1. The other probes have been removed.  IMPRESSION:  Localization as above.   Electronically Signed   By: Inge Rise M.D.   On: 03/10/2013 13:10   Dg C-arm 1-60 Min  03/10/2013   CLINICAL DATA:  Lumbar fusion  EXAM: LUMBAR SPINE - 2-3 VIEW; DG C-ARM 1-60 MIN  COMPARISON:  Earlier film of the same day  FINDINGS: Three fluoroscopic intraoperative images document bilateral pedicle screw placement L3, L4, and L5.  IMPRESSION: Changes of posterior fusion L3-L5.   Electronically Signed   By: Arne Cleveland M.D.   On: 03/10/2013 15:32    Assessment/Plan: Will give decadron, start neurontin.  Continue Hemovac today.  Mobilize with PT.  Continue PCA.    LOS: 1 day    Peggyann Shoals, MD 03/11/2013, 8:57 AM

## 2013-03-12 MED ORDER — SODIUM CHLORIDE 0.9 % IJ SOLN
10.0000 mL | INTRAMUSCULAR | Status: DC | PRN
  Administered 2013-03-12 (×2): 10 mL

## 2013-03-12 NOTE — Evaluation (Signed)
Occupational Therapy Evaluation Patient Details Name: Jason Cross MRN: [id number] DOB: [birth year] Today's Date: 03/12/2013 Time: 8527-7824 OT Time Calculation (min): 27 min  OT Assessment / Plan / Recommendation History of present illness Lumbar three-four, Lumbar four-five Redo decompression/Fusion, Thoracic twelve-Lumbar one Laminectomy PMHx peripheral neuropathy, CAD/CABG, neck and back surgery   Clinical Impression   Pt presents with below problem list. Pt independent with ADLs, PTA. Feel pt will benefit from acute OT to increase independence prior to d/c. Recommending CIR for additional rehab prior to d/c home.     OT Assessment  Patient needs continued OT Services    Follow Up Recommendations  CIR    Barriers to Discharge      Equipment Recommendations  Other (comment) (TBD)    Recommendations for Other Services Rehab consult  Frequency  Min 2X/week    Precautions / Restrictions Precautions Precautions: Back;Fall Precaution Booklet Issued: No Precaution Comments: reviewed precautions with pt Required Braces or Orthoses: Spinal Brace Spinal Brace: Lumbar corset;Applied in sitting position Restrictions Weight Bearing Restrictions: No   Pertinent Vitals/Pain Pain in right leg (said it felt like bruise). No pain in back at end of session. Increased activity during session.     ADL  Upper Body Dressing: Moderate assistance (back brace) Where Assessed - Upper Body Dressing: Unsupported sitting Lower Body Dressing: Minimal assistance Where Assessed - Lower Body Dressing: Supported sit to stand Toilet Transfer: Minimal assistance Toilet Transfer Method: Sit to Loss adjuster, chartered: Other (comment) (recliner chair) Tub/Shower Transfer Method: Not assessed Equipment Used: Gait belt;Back brace;Rolling walker;Reacher;Long-handled sponge;Long-handled shoe horn;Sock aid Transfers/Ambulation Related to ADLs: Min A/+2 for ambulation for safety. Min A for  transfers. ADL Comments: Educated on AE for LB ADLs as pt was able to cross legs over knees, but appeared to be breaking precautions when donning socks. Educated on toilet aid for hygiene. Spoke with wife about tub bench and briefly about technique and also explained technique for walk in shower.     OT Diagnosis: Acute pain;Generalized weakness  OT Problem List: Decreased strength;Decreased activity tolerance;Impaired balance (sitting and/or standing);Decreased knowledge of use of DME or AE;Decreased knowledge of precautions;Pain OT Treatment Interventions: Self-care/ADL training;DME and/or AE instruction;Therapeutic activities;Patient/family education;Balance training   OT Goals(Current goals can be found in the care plan section) Acute Rehab OT Goals Patient Stated Goal: not stated OT Goal Formulation: With patient Time For Goal Achievement: 03/19/13 Potential to Achieve Goals: Good ADL Goals Pt Will Perform Lower Body Bathing: with min guard assist;sit to/from stand Pt Will Perform Lower Body Dressing: with min guard assist;sit to/from stand Pt Will Transfer to Toilet: with min guard assist;ambulating Pt Will Perform Toileting - Clothing Manipulation and hygiene: with min guard assist;sit to/from stand Additional ADL Goal #1: Pt will independently verbalize and demonstrate 3/3 back precautions.  Additional ADL Goal #2: Pt will perform bed mobility at supervision level as precursor for ADLs.   Visit Information  Last OT Received On: 03/12/13 Assistance Needed: +2 History of Present Illness: Lumbar three-four, Lumbar four-five Redo decompression/Fusion, Thoracic twelve-Lumbar one Laminectomy PMHx peripheral neuropathy, CAD/CABG, neck and back surgery       Prior Functioning     Home Living Family/patient expects to be discharged to:: Private residence Living Arrangements: Spouse/significant other;Other relatives (also 78 year old grandaughter 3 days/week) Available Help at  Discharge: Family;Available 24 hours/day Type of Home: House Home Access: Stairs to enter [employer] of Steps: 4 Entrance Stairs-Rails: Right;Left Home Layout: One level Home Equipment: Cane - single point;Walker -  standard;Bedside commode;Other (comment) Prior Function Level of Independence: Independent with assistive device(s) Comments: used cane at times; able to ambulate community distances PTA Communication Communication: HOH         Vision/Perception     Cognition  Cognition Arousal/Alertness: Awake/alert Behavior During Therapy: WFL for tasks assessed/performed Overall Cognitive Status: Within Functional Limits for tasks assessed    Extremity/Trunk Assessment Upper Extremity Assessment Upper Extremity Assessment: Overall WFL for tasks assessed Lower Extremity Assessment Lower Extremity Assessment: Defer to PT evaluation     Mobility Bed Mobility Overal bed mobility: Needs Assistance Bed Mobility: Rolling;Sit to Sidelying Rolling: Min assist Sidelying to sit: Mod assist Sit to sidelying: Max assist General bed mobility comments: Assist with LE's and to keep pt on side when going from sitting to sidelying position.  Cues for technique. Transfers Overall transfer level: Needs assistance Equipment used: Rolling walker (2 wheeled) Transfers: Sit to/from Stand Sit to Stand: Min assist General transfer comment: Cues for technique.      Exercise        End of Session OT - End of Session Equipment Utilized During Treatment: Gait belt;Rolling walker;Back brace Activity Tolerance: Patient tolerated treatment well Patient left: in bed;with call bell/phone within reach;with bed alarm set;with family/visitor present Nurse Communication: Other (comment) (present during some of session)  [place], Liberal L OTR/L [phone] 03/12/2013, 2:01 PM

## 2013-03-12 NOTE — Plan of Care (Signed)
Problem: Phase I Progression Outcomes Goal: OOB as tolerated unless otherwise ordered Outcome: Progressing Patient much stronger today.  Able to walk with therapy in room; wearing brace as prescribed.  Able to ambulate to bathroom to attempt BM.

## 2013-03-12 NOTE — Progress Notes (Signed)
Patient ID: Jason Cross, male   DOB: 23-Jun-1928, 80 y.o.   MRN: [id number] Doing well. Claims he is not having pain. Pt helping. Still some drainage in hemovac,

## 2013-03-12 NOTE — Plan of Care (Signed)
Problem: Phase I Progression Outcomes Goal: Pain controlled with appropriate interventions Outcome: Completed/Met Date Met:  03/12/13 Patient finding adequate pain relief with PCA at this time. Does cause some confusion at times. Goal: Initial discharge plan identified Outcome: Completed/Met Date Met:  03/12/13 Initial recommendation from therapy is CIR for further strengthening. Will require evaluation from CIR team.

## 2013-03-12 NOTE — Progress Notes (Signed)
Physical Therapy Treatment Patient Details Name: Jason Cross MRN: [id number] DOB: January 11, 1929 Today's Date: 03/12/2013 Time: 1638-4665 PT Time Calculation (min): 17 min  PT Assessment / Plan / Recommendation  History of Present Illness Lumbar three-four, Lumbar four-five Redo decompression/Fusion, Thoracic twelve-Lumbar one Laminectomy PMHx peripheral neuropathy, CAD/CABG, neck and back surgery   PT Comments   Patient making gains with mobility and gait today.  Agree with need for CIR at discharge.  Follow Up Recommendations  CIR     Does the patient have the potential to tolerate intense rehabilitation     Barriers to Discharge        Equipment Recommendations  Rolling walker with 5" wheels    Recommendations for Other Services Rehab consult  Frequency 7X/week   Progress towards PT Goals Progress towards PT goals: Progressing toward goals  Plan Current plan remains appropriate    Precautions / Restrictions Precautions Precautions: Back;Fall Precaution Comments: pt able to recall 3/3 precautions with cues Required Braces or Orthoses: Spinal Brace Spinal Brace: Lumbar corset;Applied in sitting position Restrictions Weight Bearing Restrictions: No   Pertinent Vitals/Pain Patient reports no pain today.    Mobility  Bed Mobility Overal bed mobility: Needs Assistance Bed Mobility: Rolling;Sidelying to Sit Rolling: Min guard Sidelying to sit: Mod assist General bed mobility comments: Verbal cues for technique - step-by-step instructions.  Use of rail with rolling - no physical assist needed.  Assist to move trunk to sitting position.  Once upright, patient with good balance.  Total assist to don back brace. Transfers Overall transfer level: Needs assistance Equipment used: Rolling walker (2 wheeled) Transfers: Sit to/from Stand Sit to Stand: Min assist;+2 physical assistance General transfer comment: Verbal cues for hand placement and technique.  Able to push up to  standing with UE's and min assist for stability.  Stood for several seconds for balance. Ambulation/Gait Ambulation/Gait assistance: Mod assist;+2 safety/equipment Ambulation Distance (Feet): 18 Feet Assistive device: Rolling walker (2 wheeled) Gait Pattern/deviations: Step-through pattern;Decreased stride length;Shuffle;Trunk flexed Gait velocity: Slow gait speed General Gait Details: Verbal cues for safe use of RW.  Cues to stand upright - patient with flexed posture.  Assist to maneuver RW needed for safety.      PT Goals (current goals can now be found in the care plan section)    Visit Information  Last PT Received On: 03/12/13 Assistance Needed: +2 History of Present Illness: Lumbar three-four, Lumbar four-five Redo decompression/Fusion, Thoracic twelve-Lumbar one Laminectomy PMHx peripheral neuropathy, CAD/CABG, neck and back surgery    Subjective Data  Subjective: "I'm not hurting now"   Cognition  Cognition Arousal/Alertness: Awake/alert Behavior During Therapy: WFL for tasks assessed/performed Overall Cognitive Status: Within Functional Limits for tasks assessed    Balance  Balance Overall balance assessment: Needs assistance Sitting-balance support: No upper extremity supported;Feet supported Sitting balance-Leahy Scale: Good Standing balance support: Bilateral upper extremity supported Standing balance-Leahy Scale: Poor  End of Session PT - End of Session Equipment Utilized During Treatment: Gait belt;Back brace Activity Tolerance: Patient limited by fatigue Patient left: in chair;with call bell/phone within reach;with family/visitor present Nurse Communication: Mobility status   GP     Despina Pole 03/12/2013, 10:48 AM Carita Pian. Sanjuana Kava, [place] Pager [phone]

## 2013-03-12 NOTE — Progress Notes (Signed)
Utilization review completed.  

## 2013-03-13 DIAGNOSIS — IMO0002 Reserved for concepts with insufficient information to code with codable children: Secondary | ICD-10-CM

## 2013-03-13 DIAGNOSIS — M48062 Spinal stenosis, lumbar region with neurogenic claudication: Secondary | ICD-10-CM

## 2013-03-13 MED FILL — Sodium Chloride IV Soln 0.9%: INTRAVENOUS | Qty: 1000 | Status: AC

## 2013-03-13 MED FILL — Heparin Sodium (Porcine) Inj 1000 Unit/ML: INTRAMUSCULAR | Qty: 30 | Status: AC

## 2013-03-13 NOTE — Progress Notes (Signed)
Patient wishes to stay at [facility] for inpatient Rehab.

## 2013-03-13 NOTE — Consult Note (Signed)
Physical Medicine and Rehabilitation Consult  Reason for Consult: Severe back pain radiating to BLE Referring Physician: Dr. Vertell Limber   HPI: Jason Cross is a 80 y.o. male with history of HTN, peripheral neuropathy, severe back pain radiating to BLE. Patient with spondylosis and cord compression at T12-L1 and severe spinal stenosis and marked facet hypertrophy at the L3-L5 levels. He elected to undergo redo lumbar decompressive fusion L4-5, and T12-L1 laminectomy with L3-L5 posterolateral arthrodesis. Post op with improvement in pain management but with urinary retention requiring foley placement. Has had some confusion due to narcotics. MD, PT, recommending CIR.   Patient states he has no other pains beside his lower back area. No shooting pains into his arms or legs. No arm weakness. Mild leg weakness. Wife at home can assist but is apprehensive  ROS Past Medical History  Diagnosis Date  . Hyperlipidemia     takes Simvastatin daily  . Hypertension     takes HCTZ daily  . Coronary artery disease   . Peripheral neuropathy     takes Gabapentin daily  . Myocardial infarction   . COPD (chronic obstructive pulmonary disease)   . Pneumonia     hx of in the 60's  . Dizziness   . Arthritis   . Chronic back pain   . GERD (gastroesophageal reflux disease)     takes Omeprazole every other day  . Gastric ulcer     in the 60's  . History of colon polyps   . History of kidney stones   . Kidney stone     now lodged on left side but so low that it won't move per pt and wife  . Cataracts, bilateral   . Hard of hearing     doesn't wear hearing aids  . Sleep apnea    Past Surgical History  Procedure Laterality Date  . Neck surgery  1962  . Back surgery  2007  . Microthermo prostate  2009  . Tonsillectomy      at age 35  . Colonoscopy    . Eye surgery      implants  . Coronary artery bypass graft  2009    LIMA to LAD, SVG to D1 07/2007   History reviewed. No pertinent family  history.  Social History:  reports that he has quit smoking. He does not have any smokeless tobacco history on file. He reports that he does not drink alcohol or use illicit drugs.   Allergies  Allergen Reactions  . Contrast Media [Iodinated Diagnostic Agents] Hives, Itching and Rash  . Codeine Nausea And Vomiting   Medications Prior to Admission  Medication Sig Dispense Refill  . aspirin 81 MG tablet Take 81 mg by mouth daily.      Marland Kitchen gabapentin (NEURONTIN) 300 MG capsule Take 300 mg by mouth 3 (three) times daily.      . hydrochlorothiazide (MICROZIDE) 12.5 MG capsule Take 12.5 mg by mouth daily.      Marland Kitchen omeprazole (PRILOSEC OTC) 20 MG tablet Take 10 mg by mouth every other day.      . simvastatin (ZOCOR) 40 MG tablet Take 40 mg by mouth daily.        Home: Home Living Family/patient expects to be discharged to:: Private residence Living Arrangements: Spouse/significant other;Other relatives (also 2 year old grandaughter 3 days/week) Available Help at Discharge: Family;Available 24 hours/day Type of Home: House Home Access: Stairs to enter [employer] of Steps: 4 Entrance Stairs-Rails: Right;Left Home Layout: One level Home  Equipment: Cane - single point;Walker - standard;Bedside commode;Other (comment)  Functional History: Prior Function Comments: used cane at times; able to ambulate community distances PTA Functional Status:  Mobility:     Ambulation/Gait Ambulation Distance (Feet): 100 Feet (x3) Gait velocity: Slow gait speed General Gait Details: cues for upright posture, positioning within RW.  pt with much improved mobility and very eager for ambulation.      ADL: ADL Upper Body Dressing: Moderate assistance (back brace) Where Assessed - Upper Body Dressing: Unsupported sitting Lower Body Dressing: Minimal assistance Where Assessed - Lower Body Dressing: Supported sit to stand Toilet Transfer: Minimal assistance Toilet Transfer Method: Sit to  Loss adjuster, chartered: Other (comment) (recliner chair) Tub/Shower Transfer Method: Not assessed Equipment Used: Gait belt;Back brace;Rolling walker;Reacher;Long-handled sponge;Long-handled shoe horn;Sock aid Transfers/Ambulation Related to ADLs: Min A/+2 for ambulation for safety. Min A for transfers. ADL Comments: Educated on AE for LB ADLs as pt was able to cross legs over knees, but appeared to be breaking precautions when donning socks. Educated on toilet aid for hygiene. Spoke with wife about tub bench and briefly about technique and also explained technique for walk in shower.   Cognition: Cognition Overall Cognitive Status: Within Functional Limits for tasks assessed Orientation Level: Oriented X4 Cognition Arousal/Alertness: Awake/alert Behavior During Therapy: WFL for tasks assessed/performed Overall Cognitive Status: Within Functional Limits for tasks assessed  Blood pressure 137/67, pulse 98, temperature 97.8 F (36.6 C), temperature source Oral, resp. rate 18, height 6' 0.05" (1.83 m), weight 82.5 kg (181 lb 14.1 oz), SpO2 95.00%. Physical Exam  Nursing note and vitals reviewed. Constitutional: He is oriented to person, place, and time. He appears well-developed and well-nourished.  HENT:  Head: Normocephalic and atraumatic.  Right Ear: External ear normal.  Left Ear: External ear normal.  Eyes: Conjunctivae and EOM are normal. Pupils are equal, round, and reactive to light.  Neck: Normal range of motion. Neck supple.  Cardiovascular: Normal rate, regular rhythm and normal heart sounds.   Respiratory: Effort normal and breath sounds normal.  GI: Soft. Bowel sounds are normal. He exhibits no distension. There is no tenderness. There is no rebound and no guarding.  Musculoskeletal:  Back incision CDI    Neurological: He is alert and oriented to person, place, and time. He has normal reflexes. No cranial nerve deficit. He exhibits normal muscle tone.  5/5  bilateral deltoid, bicep, tricep, grip 4/5 bilateral hip flexor knee extensor and, ankle dorsiflexion and plantarflexion  Skin: Skin is warm and dry.  Psychiatric: He has a normal mood and affect. His behavior is normal.    No results found for this or any previous visit (from the past 24 hour(s)). No results found.  Assessment/Plan: Diagnosis: Lumbar spinal stenosis status post T. 12/L1 laminectomy as well as a redo L3-4 L4-5 fusion postop day #3 1. Does the need for close, 24 hr/day medical supervision in concert with the patient's rehab needs make it unreasonable for this patient to be served in a less intensive setting? Potentially 2. Co-Morbidities requiring supervision/potential complications: Hypertension 3. Due to bowel management, safety, skin/wound care, disease management, medication administration, pain management and patient education, does the patient require 24 hr/day rehab nursing? Potentially 4. Does the patient require coordinated care of a physician, rehab nurse, PT (0.5-1 hrs/day, 5 days/week) and OT (0.501 hrs/day, 5 days/week) to address physical and functional deficits in the context of the above medical diagnosis(es)? Potentially Addressing deficits in the following areas: balance, endurance, locomotion, strength, transferring, bowel/bladder  control, bathing, dressing and toileting 5. Can the patient actively participate in an intensive therapy program of at least 3 hrs of therapy per day at least 5 days per week? Yes 6. The potential for patient to make measurable gains while on inpatient rehab is fair 7. Anticipated functional outcomes upon discharge from inpatient rehab are Mod I with PT, Mod with OT, NA with SLP. 8. Estimated rehab length of stay to reach the above functional goals is: 5-7 d 9. Does the patient have adequate social supports to accommodate these discharge functional goals? Potentially 10. Anticipated D/C setting: Home 11. Anticipated post D/C  treatments: [place] therapy 12. Overall Rehab/Functional Prognosis: excellent  RECOMMENDATIONS: This patient's condition is appropriate for continued rehabilitative care in the following setting: Patient should progress to supervision level in the next 1-2 days. If remains at min assist level then considers short CIR stay Patient has agreed to participate in recommended program. Yes Note that insurance prior authorization may be required for reimbursement for recommended care.  Comment: Pts wife is apprehensive about bring pt home despite excellent progress thus far and is considering SNF    03/13/2013

## 2013-03-13 NOTE — Progress Notes (Signed)
Subjective: Patient reports "I feel pretty good...pain's only 1-2"  Objective: Vital signs in last 24 hours: Temp:  [97.6 F (36.4 C)-98.2 F (36.8 C)] 97.6 F (36.4 C) (01/19 0622) Pulse Rate:  [86-99] 86 (01/19 0622) Resp:  [14-20] 16 (01/19 0622) BP: (124-132)/(72-83) 132/80 mmHg (01/19 0622) SpO2:  [92 %-98 %] 95 % (01/19 0622) FiO2 (%):  [21 %-97 %] 21 % (01/18 1638)  Intake/Output from previous day: 01/18 0701 - 01/19 0700 In: 250 [P.O.:250] Out: 2415 [Urine:2300; Drains:115] Intake/Output this shift:    Alert, sitting in chair eating breakfast. Strength equal BLE. Hemovac ~53ml overnight. Foley(Coude) in place for retention yesterday. Pt pleased with decrease in lumbar pain - using PCA minimally per nursing. PT recommending CIR, but pt hopes for SNF nearer to [facility] to minimize wife's travel.   Lab Results: No results found for this basename: WBC, HGB, HCT, PLT,  in the last 72 hours BMET No results found for this basename: NA, K, CL, CO2, GLUCOSE, BUN, CREATININE, CALCIUM,  in the last 72 hours  Studies/Results: No results found.  Assessment/Plan: Improving   LOS: 3 days  Per Dr. Vertell Limber, d/c PCA, d/c Hemovac, d/c central line, Social Work c/s for SNF discussions  Continue to mobilize in LSO with PT. Will leave Foley in place this am.   Verdis Prime 03/13/2013, 8:12 AM

## 2013-03-13 NOTE — Progress Notes (Signed)
Pt is refusing CPAP tonight. He stated that he does wear CPAP at home but didn't want to wear it here. RT explained importance of CPAP and explained if the RN noticed decreased Sats or periods of Apnea we would bring a machine up per MD orders. Pt understands and knows to call if he changes his mind. No distress at this time. RT will continue to monitor.

## 2013-03-13 NOTE — Progress Notes (Signed)
Physical Therapy Treatment Patient Details Name: Jason Cross MRN: [id number] DOB: [birth year] Today's Date: 03/13/2013 Time: 6063-0160 PT Time Calculation (min): 22 min  PT Assessment / Plan / Recommendation  History of Present Illness Lumbar three-four, Lumbar four-five Redo decompression/Fusion, Thoracic twelve-Lumbar one Laminectomy PMHx peripheral neuropathy, CAD/CABG, neck and back surgery   PT Comments   Pt with much improved mobility and very motivated to work on ambulation today.  Pt does fatigue easily, but recovers well.  Will continue to follow.    Follow Up Recommendations  CIR     Does the patient have the potential to tolerate intense rehabilitation     Barriers to Discharge        Equipment Recommendations  Rolling walker with 5" wheels    Recommendations for Other Services    Frequency Min 5X/week   Progress towards PT Goals Progress towards PT goals: Progressing toward goals  Plan Frequency needs to be updated    Precautions / Restrictions Precautions Precautions: Back;Fall Precaution Comments: pt verbalized 3/3 back precautions.   Required Braces or Orthoses: Spinal Brace Spinal Brace: Lumbar corset;Applied in sitting position Restrictions Weight Bearing Restrictions: No   Pertinent Vitals/Pain Indicates "stiff and sore all over".  Pt states he was premedicated.      Mobility  Bed Mobility Overal bed mobility: Needs Assistance Bed Mobility: Rolling;Sidelying to Sit;Sit to Sidelying Rolling: Supervision Sidelying to sit: Min guard Sit to sidelying: Min assist General bed mobility comments: Cues for log roll and back precautions.  Only A needed for bringing LEs back into bed.   Transfers Overall transfer level: Needs assistance Equipment used: Rolling walker (2 wheeled) Transfers: Sit to/from Stand Sit to Stand: Min assist General transfer comment: cues for use of UEs and controlling descent to sitting.   Ambulation/Gait Ambulation/Gait  assistance: Min assist Ambulation Distance (Feet): 100 Feet (x3) Assistive device: Rolling walker (2 wheeled) Gait Pattern/deviations: Step-through pattern;Decreased stride length;Trunk flexed General Gait Details: cues for upright posture, positioning within RW.  pt with much improved mobility and very eager for ambulation.      Exercises     PT Diagnosis:    PT Problem List:   PT Treatment Interventions:     PT Goals (current goals can now be found in the care plan section) Acute Rehab PT Goals Time For Goal Achievement: 03/18/13 Potential to Achieve Goals: Good  Visit Information  Last PT Received On: 03/13/13 Assistance Needed: +1 History of Present Illness: Lumbar three-four, Lumbar four-five Redo decompression/Fusion, Thoracic twelve-Lumbar one Laminectomy PMHx peripheral neuropathy, CAD/CABG, neck and back surgery    Subjective Data      Cognition  Cognition Arousal/Alertness: Awake/alert Behavior During Therapy: WFL for tasks assessed/performed Overall Cognitive Status: Within Functional Limits for tasks assessed    Balance  Balance Standing balance support: Bilateral upper extremity supported Standing balance-Leahy Scale: Fair  End of Session PT - End of Session Equipment Utilized During Treatment: Gait belt;Back brace Activity Tolerance: Patient tolerated treatment well Patient left: in bed;with call bell/phone within reach;with family/visitor present Nurse Communication: Mobility status   GP     Jason Cross, [place] 03/13/2013, 10:45 AM

## 2013-03-13 NOTE — Progress Notes (Signed)
Rehab Admissions Coordinator Note:  Patient was screened by Retta Diones for appropriateness for an Inpatient Acute Rehab Consult.  At this time, we are recommending Jason Cross.  Noted patient wants SNF closer to home.  However, could consider inpatient rehab consult if preferred.  Retta Diones 03/13/2013, 9:01 AM  I can be reached at [phone].

## 2013-03-14 ENCOUNTER — Inpatient Hospital Stay (HOSPITAL_COMMUNITY)
Admission: RE | Admit: 2013-03-14 | Discharge: 2013-03-18 | DRG: 945 | Disposition: A | Discharge status: Home Health | Payer: Medicare Other | Source: Intra-hospital | Attending: Physical Medicine & Rehabilitation | Admitting: Physical Medicine & Rehabilitation

## 2013-03-14 ENCOUNTER — Encounter (HOSPITAL_COMMUNITY): Payer: Self-pay | Admitting: Neurosurgery

## 2013-03-14 DIAGNOSIS — Z79899 Other long term (current) drug therapy: Secondary | ICD-10-CM | POA: Diagnosis not present

## 2013-03-14 DIAGNOSIS — M4716 Other spondylosis with myelopathy, lumbar region: Secondary | ICD-10-CM

## 2013-03-14 DIAGNOSIS — N179 Acute kidney failure, unspecified: Secondary | ICD-10-CM | POA: Diagnosis present

## 2013-03-14 DIAGNOSIS — J449 Chronic obstructive pulmonary disease, unspecified: Secondary | ICD-10-CM | POA: Diagnosis not present

## 2013-03-14 DIAGNOSIS — N189 Chronic kidney disease, unspecified: Secondary | ICD-10-CM

## 2013-03-14 DIAGNOSIS — Z981 Arthrodesis status: Secondary | ICD-10-CM | POA: Diagnosis not present

## 2013-03-14 DIAGNOSIS — Z7982 Long term (current) use of aspirin: Secondary | ICD-10-CM | POA: Diagnosis not present

## 2013-03-14 DIAGNOSIS — N183 Chronic kidney disease, stage 3 unspecified: Secondary | ICD-10-CM | POA: Diagnosis not present

## 2013-03-14 DIAGNOSIS — E785 Hyperlipidemia, unspecified: Secondary | ICD-10-CM | POA: Diagnosis not present

## 2013-03-14 DIAGNOSIS — R339 Retention of urine, unspecified: Secondary | ICD-10-CM | POA: Diagnosis not present

## 2013-03-14 DIAGNOSIS — Z5189 Encounter for other specified aftercare: Secondary | ICD-10-CM | POA: Diagnosis not present

## 2013-03-14 DIAGNOSIS — M4306 Spondylolysis, lumbar region: Secondary | ICD-10-CM | POA: Diagnosis present

## 2013-03-14 DIAGNOSIS — H9190 Unspecified hearing loss, unspecified ear: Secondary | ICD-10-CM | POA: Diagnosis not present

## 2013-03-14 DIAGNOSIS — J4489 Other specified chronic obstructive pulmonary disease: Secondary | ICD-10-CM

## 2013-03-14 DIAGNOSIS — Z951 Presence of aortocoronary bypass graft: Secondary | ICD-10-CM | POA: Diagnosis not present

## 2013-03-14 DIAGNOSIS — N138 Other obstructive and reflux uropathy: Secondary | ICD-10-CM | POA: Diagnosis not present

## 2013-03-14 DIAGNOSIS — I129 Hypertensive chronic kidney disease with stage 1 through stage 4 chronic kidney disease, or unspecified chronic kidney disease: Secondary | ICD-10-CM

## 2013-03-14 DIAGNOSIS — IMO0002 Reserved for concepts with insufficient information to code with codable children: Secondary | ICD-10-CM

## 2013-03-14 DIAGNOSIS — K5900 Constipation, unspecified: Secondary | ICD-10-CM

## 2013-03-14 DIAGNOSIS — Q762 Congenital spondylolisthesis: Secondary | ICD-10-CM | POA: Diagnosis not present

## 2013-03-14 DIAGNOSIS — K219 Gastro-esophageal reflux disease without esophagitis: Secondary | ICD-10-CM

## 2013-03-14 DIAGNOSIS — Z87891 Personal history of nicotine dependence: Secondary | ICD-10-CM

## 2013-03-14 DIAGNOSIS — N319 Neuromuscular dysfunction of bladder, unspecified: Secondary | ICD-10-CM

## 2013-03-14 DIAGNOSIS — I252 Old myocardial infarction: Secondary | ICD-10-CM | POA: Diagnosis not present

## 2013-03-14 DIAGNOSIS — N401 Enlarged prostate with lower urinary tract symptoms: Secondary | ICD-10-CM

## 2013-03-14 DIAGNOSIS — G4730 Sleep apnea, unspecified: Secondary | ICD-10-CM | POA: Diagnosis not present

## 2013-03-14 DIAGNOSIS — I2510 Atherosclerotic heart disease of native coronary artery without angina pectoris: Secondary | ICD-10-CM | POA: Diagnosis not present

## 2013-03-14 DIAGNOSIS — K5901 Slow transit constipation: Secondary | ICD-10-CM | POA: Diagnosis present

## 2013-03-14 DIAGNOSIS — G609 Hereditary and idiopathic neuropathy, unspecified: Secondary | ICD-10-CM

## 2013-03-14 DIAGNOSIS — M4712 Other spondylosis with myelopathy, cervical region: Secondary | ICD-10-CM

## 2013-03-14 MED ORDER — BISACODYL 10 MG RE SUPP: 10.0000 mg | Freq: Every day | RECTAL | Status: DC | PRN

## 2013-03-14 MED ORDER — GABAPENTIN 100 MG PO CAPS
200.0000 mg | ORAL_CAPSULE | Freq: Three times a day (TID) | ORAL | Status: DC
  Administered 2013-03-14 – 2013-03-15 (×3): 200 mg via ORAL
  Filled 2013-03-14 (×5): qty 2

## 2013-03-14 MED ORDER — SENNOSIDES-DOCUSATE SODIUM 8.6-50 MG PO TABS
2.0000 | ORAL_TABLET | Freq: Every day | ORAL | Status: DC
  Administered 2013-03-14 – 2013-03-17 (×3): 2 via ORAL
  Filled 2013-03-14 (×5): qty 2

## 2013-03-14 MED ORDER — TRAMADOL HCL 50 MG PO TABS
50.0000 mg | ORAL_TABLET | Freq: Four times a day (QID) | ORAL | Status: DC | PRN
  Administered 2013-03-16 (×2): 50 mg via ORAL
  Filled 2013-03-14 (×2): qty 1

## 2013-03-14 MED ORDER — SORBITOL 70 % SOLN
45.0000 mL | Freq: Once | Status: AC
  Administered 2013-03-14: 45 mL via ORAL
  Filled 2013-03-14: qty 60

## 2013-03-14 MED ORDER — FLEET ENEMA 7-19 GM/118ML RE ENEM: 1.0000 | ENEMA | Freq: Once | RECTAL | Status: AC | PRN

## 2013-03-14 MED ORDER — DIPHENHYDRAMINE HCL 12.5 MG/5ML PO ELIX
12.5000 mg | ORAL_SOLUTION | Freq: Four times a day (QID) | ORAL | Status: DC | PRN
  Filled 2013-03-14: qty 10

## 2013-03-14 MED ORDER — ALUM & MAG HYDROXIDE-SIMETH 200-200-20 MG/5ML PO SUSP: 30.0000 mL | Freq: Four times a day (QID) | ORAL | Status: DC | PRN

## 2013-03-14 MED ORDER — PROCHLORPERAZINE EDISYLATE 5 MG/ML IJ SOLN
5.0000 mg | Freq: Four times a day (QID) | INTRAMUSCULAR | Status: DC | PRN
  Filled 2013-03-14: qty 2

## 2013-03-14 MED ORDER — PHENOL 1.4 % MT LIQD: 1.0000 | OROMUCOSAL | Status: DC | PRN

## 2013-03-14 MED ORDER — HYDROCODONE-ACETAMINOPHEN 5-325 MG PO TABS
1.0000 | ORAL_TABLET | ORAL | Status: DC | PRN
  Administered 2013-03-17: 1 via ORAL
  Filled 2013-03-14: qty 2

## 2013-03-14 MED ORDER — FLEET ENEMA 7-19 GM/118ML RE ENEM
1.0000 | ENEMA | Freq: Once | RECTAL | Status: AC
  Administered 2013-03-15: 15:00:00 via RECTAL
  Filled 2013-03-14 (×2): qty 1

## 2013-03-14 MED ORDER — PANTOPRAZOLE SODIUM 40 MG PO TBEC
40.0000 mg | DELAYED_RELEASE_TABLET | ORAL | Status: DC
  Administered 2013-03-16 – 2013-03-18 (×2): 40 mg via ORAL
  Filled 2013-03-14 (×4): qty 1

## 2013-03-14 MED ORDER — METHOCARBAMOL 500 MG PO TABS
500.0000 mg | ORAL_TABLET | Freq: Four times a day (QID) | ORAL | Status: DC | PRN
  Administered 2013-03-16: 500 mg via ORAL
  Filled 2013-03-14: qty 1

## 2013-03-14 MED ORDER — HYDROCHLOROTHIAZIDE 12.5 MG PO CAPS
12.5000 mg | ORAL_CAPSULE | Freq: Every day | ORAL | Status: DC
Start: 2013-03-15 — End: 2013-03-15
  Administered 2013-03-15: 12.5 mg via ORAL
  Filled 2013-03-14 (×2): qty 1

## 2013-03-14 MED ORDER — GUAIFENESIN-DM 100-10 MG/5ML PO SYRP: 5.0000 mL | ORAL_SOLUTION | Freq: Four times a day (QID) | ORAL | Status: DC | PRN

## 2013-03-14 MED ORDER — PROCHLORPERAZINE MALEATE 5 MG PO TABS
5.0000 mg | ORAL_TABLET | Freq: Four times a day (QID) | ORAL | Status: DC | PRN
  Filled 2013-03-14: qty 2

## 2013-03-14 MED ORDER — SIMVASTATIN 40 MG PO TABS
40.0000 mg | ORAL_TABLET | Freq: Every day | ORAL | Status: DC
  Administered 2013-03-15 – 2013-03-18 (×4): 40 mg via ORAL
  Filled 2013-03-14 (×6): qty 1

## 2013-03-14 MED ORDER — ENOXAPARIN SODIUM 40 MG/0.4ML ~~LOC~~ SOLN
40.0000 mg | SUBCUTANEOUS | Status: DC
  Administered 2013-03-14 – 2013-03-17 (×4): 40 mg via SUBCUTANEOUS
  Filled 2013-03-14 (×5): qty 0.4

## 2013-03-14 MED ORDER — ASPIRIN EC 81 MG PO TBEC
81.0000 mg | DELAYED_RELEASE_TABLET | Freq: Every day | ORAL | Status: DC
  Administered 2013-03-15 – 2013-03-18 (×4): 81 mg via ORAL
  Filled 2013-03-14 (×6): qty 1

## 2013-03-14 MED ORDER — MENTHOL 3 MG MT LOZG
1.0000 | LOZENGE | OROMUCOSAL | Status: DC | PRN
  Filled 2013-03-14: qty 9

## 2013-03-14 MED ORDER — PROCHLORPERAZINE 25 MG RE SUPP
12.5000 mg | Freq: Four times a day (QID) | RECTAL | Status: DC | PRN
  Filled 2013-03-14: qty 1

## 2013-03-14 NOTE — Discharge Summary (Signed)
Physician Discharge Summary  Patient ID: Jason Cross MRN: [id number] DOB/AGE: 1928/04/10 80 y.o.  Admit date: 03/10/2013 Discharge date: 03/14/2013  Admission Diagnoses: Recurrent Lumbar Stenosis L 34, L 45, Scoliosis, Radiculopathy, Thoracic stenosis T 12 - L1     Discharge Diagnoses: Recurrent Lumbar Stenosis L 34, L 45, Scoliosis, Radiculopathy, Thoracic stenosis T 12 - L1 s/p Lumbar three-four, Lumbar four-five Redo decompression/Fusion (N/A) - Lumbar three-four, Lumbar four-five Redo decompression/Fusion Thoracic twelve-Lumbar one Laminectomy (N/A) - Thoracic twelve-Lumbar one Laminectomy with Pedicle screw fixation  L 3 - L 5 levels with posterolateral arthrodesis    Active Problems:   Lumbar scoliosis   Discharged Condition: good  Hospital Course: Jason Cross was admitted for surgery with dx stenosis, scoliosis, radiculopathy.  Following uncomplicated redo decompression L3-4, L4-5, fusion L3-L5, & decompressive laminectomy T12-L1, he recovered in Neuro PACU and transferred to 4N for nursing care & therapies.    Consults: rehabilitation medicine  Significant Diagnostic Studies: radiology: X-Ray: intra-operative  Treatments: surgery: Lumbar three-four, Lumbar four-five Redo decompression/Fusion (N/A) - Lumbar three-four, Lumbar four-five Redo decompression/Fusion Thoracic twelve-Lumbar one Laminectomy (N/A) - Thoracic twelve-Lumbar one Laminectomy with Pedicle screw fixation  L 3 - L 5 levels with posterolateral arthrodesis     Discharge Exam: Blood pressure 109/61, pulse 83, temperature 98.2 F (36.8 C), temperature source Oral, resp. rate 18, height 6' 0.05" (1.83 m), weight 82.5 kg (181 lb 14.1 oz), SpO2 100.00%. 0715 visit: Awakens to voice, reporting minimal lumbar pain at present. MAEW, good strength BLE. Incision with intact honeycomb drsg, no erythema, swelling, or drainage. Drain site drsg intact, dry. Pt reports walking with PT in hall yesterday. Also notes  small BM yesterday.    Disposition: Discharge to [facility]     Medication List    ASK your doctor about these medications       aspirin 81 MG tablet  Take 81 mg by mouth daily.     gabapentin 300 MG capsule  Commonly known as:  NEURONTIN  Take 300 mg by mouth 3 (three) times daily.     hydrochlorothiazide 12.5 MG capsule  Commonly known as:  MICROZIDE  Take 12.5 mg by mouth daily.     omeprazole 20 MG tablet  Commonly known as:  PRILOSEC OTC  Take 10 mg by mouth every other day.     simvastatin 40 MG tablet  Commonly known as:  ZOCOR  Take 40 mg by mouth daily.         Signed: Verdis Prime 03/14/2013, 2:30 PM

## 2013-03-14 NOTE — Progress Notes (Signed)
Pt does not wish to go on CPAP tonight. He stated he has been having "bad nights" and thinks that wearing the mask will irritate him even more. Pt encouraged to call RT if pt changes mind.

## 2013-03-14 NOTE — Progress Notes (Signed)
CSW stepped in to speawk with Pt and Pt's wife to discuss SNF. CSW explained the process and gave a listing of facilities, however while CSW was in the room CIR came in to assess and stated they would accept Pt for their facility.    No furhter CSW needs at this time and CSW signing off.     [facility]  4N 1-16;  [phone] Phone: [phone]

## 2013-03-14 NOTE — Progress Notes (Signed)
Report called and given to Ed on 4100.  Pt. Will be transferred to CIR.

## 2013-03-14 NOTE — PMR Pre-admission (Signed)
PMR Admission Coordinator Pre-Admission Assessment  Patient: Jason Cross is an 80 y.o., male MRN: [id number] DOB: [birth year] Height: 6' 0.05" (183 cm) Weight: 82.5 kg (181 lb 14.1 oz)              Insurance Information HMO: no PPO:      PCP:      IPA:      80/20:      OTHER:   PRIMARY: Medicare A & B      Policy#: [id number]      Subscriber: self CM Name:       Phone#:      Fax#:  Pre-Cert#:       Employer: retired Benefits:  Phone #:      Name: verified in [place]. Date: 09-24-95     Deduct: $1260      Out of Pocket Max: none      Life Max: unlimited CIR: 100%      SNF: 100% days 1-20; 80% days 21-100 (100 day visit limit) Outpatient: 80%     Co-Pay: 20% Home Health: 100%      Co-Pay:  none DME: 80%     Co-Pay: 20% Providers: pt's preference  SECONDARY: [employer]      Policy#: [id number]      Subscriber: self CM Name:       Phone#:      Fax#:  Pre-Cert#:       Employer:  Benefits:  Phone #:      Name:  Eff. Date:      Deduct:       Out of Pocket Max:       Life Max:  CIR:       SNF:  Outpatient:      Co-Pay:  Home Health:       Co-Pay:  DME:      Co-Pay:    Emergency Contact Information Contact Information   Name Relation Home Work Mobile   Justman,Debbie Spouse [phone]  [phone]     Current Medical History  Patient Admitting Diagnosis: Lumbar spinal stenosis status post T. 12/L1 laminectomy as well as a redo L3-4 L4-5 fusion  History of Present Illness: Jason Cross is a 80 y.o. male with history of HTN, peripheral neuropathy, severe back pain radiating to BLE. Patient with spondylosis and cord compression at T12-L1 and severe spinal stenosis and marked facet hypertrophy at the L3-L5 levels. He elected to undergo redo lumbar decompressive fusion L4-5, and T12-L1 laminectomy with L3-L5 posterolateral arthrodesis. Post op with improvement in pain management but with urinary retention requiring foley placement. Has had some confusion due to narcotics. MD, PT,  recommending CIR.   Past Medical History  Past Medical History  Diagnosis Date  . Hyperlipidemia     takes Simvastatin daily  . Hypertension     takes HCTZ daily  . Coronary artery disease   . Peripheral neuropathy     takes Gabapentin daily  . Myocardial infarction   . COPD (chronic obstructive pulmonary disease)   . Pneumonia     hx of in the 60's  . Dizziness   . Arthritis   . Chronic back pain   . GERD (gastroesophageal reflux disease)     takes Omeprazole every other day  . Gastric ulcer     in the 60's  . History of colon polyps   . History of kidney stones   . Kidney stone     now lodged on left side but so low that  it won't move per pt and wife  . Cataracts, bilateral   . Hard of hearing     doesn't wear hearing aids  . Sleep apnea     Family History  family history is not on file.  Prior Rehab/Hospitalizations: previous outpt PT for LE neuropathy in fall of 2014    Current Medications  Current facility-administered medications:0.9 %  sodium chloride infusion, 250 mL, Intravenous, Continuous, Erline Levine, MD;  acetaminophen (TYLENOL) suppository 650 mg, 650 mg, Rectal, Q4H PRN, Erline Levine, MD;  acetaminophen (TYLENOL) tablet 650 mg, 650 mg, Oral, Q4H PRN, Erline Levine, MD, 650 mg at 03/11/13 2130 alum & mag hydroxide-simeth (MAALOX/MYLANTA) 200-200-20 MG/5ML suspension 30 mL, 30 mL, Oral, Q6H PRN, Erline Levine, MD, 30 mL at 03/12/13 0854;  aspirin EC tablet 81 mg, 81 mg, Oral, Daily, Erline Levine, MD, 81 mg at 03/14/13 1007;  bisacodyl (DULCOLAX) suppository 10 mg, 10 mg, Rectal, Daily PRN, Erline Levine, MD dextrose 5 % and 0.45 % NaCl with KCl 20 mEq/L infusion, , Intravenous, Continuous, Erline Levine, MD, Last Rate: 75 mL/hr at 03/11/13 1121;  docusate sodium (COLACE) capsule 100 mg, 100 mg, Oral, BID, Erline Levine, MD, 100 mg at 03/14/13 1007;  gabapentin (NEURONTIN) capsule 200 mg, 200 mg, Oral, TID, Erline Levine, MD, 200 mg at 03/14/13  1007 hydrochlorothiazide (MICROZIDE) capsule 12.5 mg, 12.5 mg, Oral, Daily, Erline Levine, MD, 12.5 mg at 03/14/13 1007;  HYDROcodone-acetaminophen (NORCO/VICODIN) 5-325 MG per tablet 1-2 tablet, 1-2 tablet, Oral, Q4H PRN, Erline Levine, MD, 2 tablet at 03/13/13 2303;  menthol-cetylpyridinium (CEPACOL) lozenge 3 mg, 1 lozenge, Oral, PRN, Erline Levine, MD methocarbamol (ROBAXIN) 500 mg in dextrose 5 % 50 mL IVPB, 500 mg, Intravenous, Q6H PRN, Erline Levine, MD, 500 mg at 03/10/13 1547;  methocarbamol (ROBAXIN) tablet 500 mg, 500 mg, Oral, Q6H PRN, Erline Levine, MD, 500 mg at 03/13/13 1743;  oxyCODONE-acetaminophen (PERCOCET/ROXICET) 5-325 MG per tablet 1-2 tablet, 1-2 tablet, Oral, Q4H PRN, Erline Levine, MD pantoprazole (PROTONIX) EC tablet 40 mg, 40 mg, Oral, QODAY, Erline Levine, MD, 40 mg at 03/14/13 1007;  phenol (CHLORASEPTIC) mouth spray 1 spray, 1 spray, Mouth/Throat, PRN, Erline Levine, MD;  polyethylene glycol (MIRALAX / GLYCOLAX) packet 17 g, 17 g, Oral, Daily PRN, Erline Levine, MD;  senna [facility]) tablet 8.6 mg, 1 tablet, Oral, BID, Erline Levine, MD, 8.6 mg at 03/14/13 1007 simvastatin (ZOCOR) tablet 40 mg, 40 mg, Oral, Daily, Erline Levine, MD, 40 mg at 03/14/13 1007;  sodium chloride 0.9 % injection 10-40 mL, 10-40 mL, Intracatheter, Q12H, Erline Levine, MD, 10 mL at 03/13/13 2304;  sodium chloride 0.9 % injection 10-40 mL, 10-40 mL, Intracatheter, PRN, Erline Levine, MD, 20 mL at 03/13/13 0530;  sodium chloride 0.9 % injection 10-40 mL, 10-40 mL, Intracatheter, PRN, Erline Levine, MD, 10 mL at 03/12/13 1837 sodium chloride 0.9 % injection 3 mL, 3 mL, Intravenous, Q12H, Erline Levine, MD, 3 mL at 03/14/13 1009;  sodium chloride 0.9 % injection 3 mL, 3 mL, Intravenous, PRN, Erline Levine, MD;  zolpidem (AMBIEN) tablet 5 mg, 5 mg, Oral, QHS PRN, Erline Levine, MD  Patients Current Diet: regular  Precautions / Restrictions Precautions Precautions: Back;Fall Precaution Booklet Issued: No Precaution  Comments: pt verbalized 3/3 back precautions.   Spinal Brace: Lumbar corset;Applied in sitting position Restrictions Weight Bearing Restrictions: No   Prior Activity Level Community (5-7x/wk): enjoyed getting out nearly everyday on errands and going to post office, was driving   Development worker, international aid / [place] Devices/Equipment:  Eyeglasses;Dentures (specify type);Cane (specify quad or straight);Raised toilet seat with rails Home Equipment: Cane - single point;Walker - standard;Bedside commode;Other (comment)  Prior Functional Level Prior Function Level of Independence: Independent with assistive device(s) Comments: used cane at times; able to ambulate community distances PTA  Current Functional Level Cognition  Overall Cognitive Status: Within Functional Limits for tasks assessed Orientation Level: Oriented X4    Extremity Assessment (includes Sensation/Coordination)          ADLs  Upper Body Dressing: Performed;Set up Where Assessed - Upper Body Dressing: Unsupported sitting Lower Body Dressing: Minimal assistance Where Assessed - Lower Body Dressing: Supported sit to stand Toilet Transfer: Performed;Minimal assistance Toilet Transfer Method: Sit to Loss adjuster, chartered: Comfort height toilet;Grab bars Toileting - Clothing Manipulation and Hygiene: Performed;Min guard Where Assessed - Best boy and Hygiene: Standing Tub/Shower Transfer Method: Not assessed Equipment Used: Gait belt;Back brace;Rolling walker;Reacher;Long-handled sponge;Long-handled shoe horn;Sock aid Transfers/Ambulation Related to ADLs: min a secondary to pt. has tendancy to increase speed of ambulation and requires cues to slow down for safe sequencing of steps with rw, and with sit/stand ADL Comments: able to doff brace in un supported sitting, all aspects of toileting min a/min guard a for safety cues.  wife present and appears with increased anxiety for  d/c home.  con't. to state "he's wobbly isnt he, i mean dont you think" pts. spouse verbalized wanting to have pt. have con't. therapy prior to d/c home    Mobility  Bed Mobility  Rolling: Supervision  Sidelying to sit: Supervision  General bed mobility comments: min verbal cues for log roll into bed. able to bring b les into bed without assistance    Transfers  Transfers  Overall transfer level: Needs assistance  Equipment used: Rolling walker (2 wheeled)  Transfers: Sit to/from Stand  Sit to Stand: Min assist;Min guard  General transfer comment: cues for tech    Ambulation / Gait / Stairs / Wheelchair Mobility  Ambulation/Gait Ambulation Distance (Feet): 250 Feet Gait velocity: Slow gait speed General Gait Details: cues for upright posture, positioning within RW.  Noted bucking of R leg x3 with walking. One seated rest break    Posture / Balance      Special needs/care consideration BiPAP/CPAP - yes, CPAP (but has not been using CPAP during this admit) CPM no Continuous Drip IV no Dialysis no         Life Vest no Oxygen no Special Bed no  Trach Size no Wound Vac (area) no       Skin - back incision lumbar region                              Bowel mgmt: - last bowel movement 03-12-13 (PTA) Bladder mgmt: - currently using foley cath Diabetic mgmt no   Previous Home Environment Living Arrangements: Spouse/significant other;Other relatives (also 28 year old grandaughter 3 days/week) Available Help at Discharge: Family;Available 24 hours/day Type of Home: House Home Layout: One level Home Access: Stairs to enter Entrance Stairs-Rails: Right;Left Entrance Stairs-Number of Steps: 4 Bathroom Shower/Tub: Tub/shower unit;Walk-in shower Bathroom Toilet:  (standard and elevated commode) Home Care Services: No  Discharge Living Setting Plans for Discharge Living Setting: Patient's home Type of Home at Discharge: House Discharge Home Layout: One level Discharge Home Access:  Stairs to enter Entrance Stairs-Rails: None (no rails at garage entrance but bil. rails at front door) Entrance Stairs-Number of Steps: 5 (2  steps down from garage, then 3 total steps up into house) Does the patient have any problems obtaining your medications?: No  Social/Family/Support Systems Patient Roles: Spouse Contact Information: wife Renato Spellman Anticipated Caregiver: Wife Anticipated Caregiver's Contact Information: Debbie's cell [phone], home [phone] Ability/Limitations of Caregiver: wife/pt have some anxiety issues  Caregiver Availability: 24/7 Discharge Plan Discussed with Primary Caregiver: Yes Is Caregiver In Agreement with Plan?: Yes Does Caregiver/Family have Issues with Lodging/Transportation while Pt is in Rehab?: No  Goals/Additional Needs Patient/Family Goal for Rehab: Mod. Ind. w/PT/OT Expected length of stay: 5-7 days Cultural Considerations: none Dietary Needs: regular Equipment Needs: to be determined Special Service Needs: none Pt/Family Agrees to Admission and willing to participate: Yes Program Orientation Provided & Reviewed with Pt/Caregiver Including Roles  & Responsibilities: Yes   Decrease burden of Care through IP rehab admission:  NA  Possible need for SNF placement upon discharge: not anticipated   Patient Condition: This patient's condition remains as documented in the consult dated 03-13-13, in which the Rehabilitation Physician determined and documented that the patient's condition is appropriate for intensive rehabilitative care in an inpatient rehabilitation facility. Will admit to inpatient rehab today.  Preadmission Screen Completed By:  Nanetta Batty, PT 03/14/2013 11:50 AM ______________________________________________________________________   Discussed status with Dr. Naaman Plummer on 03-14-13 at 12:01 pm and received telephone approval for admission today.  Admission Coordinator:  Nanetta Batty, PT time 12:01 pm/Date  03-14-13

## 2013-03-14 NOTE — Progress Notes (Signed)
Rehab Admissions - I met with pt and his wife and gave information about acute inpatient rehab. They are interested in pursuing acute rehab. Bed is available and will plan for admit today when cleared by Dr. Vertell Limber.   Updated Loma Sousa, case manager and social worker of intended plan to bring pt to acute rehab later today.  Please call with any questions. Thanks.  Nanetta Batty, PT Rehabilitation Admissions Coordinator [phone]

## 2013-03-14 NOTE — H&P (Signed)
Physical Medicine and Rehabilitation Admission H&P  CC: severe back pain radiating to BLE.  HPI: Jason Cross is a 80 y.o. male with history of HTN, peripheral neuropathy, severe back pain radiating to BLE. Patient with spondylosis and cord compression at T12-L1 and severe spinal stenosis and marked facet hypertrophy at the L3-L5 levels. He elected to undergo redo lumbar decompressive fusion L4-5, and T12-L1 laminectomy with L3-L5 posterolateral arthrodesis on 03/10/13 by Dr. Vertell Limber. Post op with improvement in pain management but with urinary retention requiring foley placement. Has had some confusion due to narcotics therefore dilaudid d/c. MD, PT, recommending CIR.  Patient states he has no other pains beside his lower back area. He does have hypersensitivity in the right leg.  No arm weakness. Mild leg weakness. Wife at home can assist but is apprehensive. After screening by the rehab service, the patient was ultimately admitted to CIR.   Review of Systems  HENT: Positive for hearing loss.  Eyes: Negative for blurred vision and double vision.  Respiratory: Negative for cough and shortness of breath.  Cardiovascular: Negative for chest pain and palpitations.  Gastrointestinal: Positive for constipation (No BM since surgery. ). Negative for heartburn, nausea and abdominal pain.  Genitourinary:  Gets up twice at nights. Has some hesitancy.  Musculoskeletal: Positive for back pain and myalgias.  Neurological: Positive for sensory change (burning RLE from knee down) and focal weakness (Tendency for RLE instability.). Negative for headaches.  Psychiatric/Behavioral: Negative for depression. The patient has insomnia. The patient is not nervous/anxious.   Past Medical History   Diagnosis  Date   .  Hyperlipidemia      takes Simvastatin daily   .  Hypertension      takes HCTZ daily   .  Coronary artery disease    .  Peripheral neuropathy      takes Gabapentin daily   .  Myocardial infarction     .  COPD (chronic obstructive pulmonary disease)    .  Pneumonia      hx of in the 60's   .  Dizziness    .  Arthritis    .  Chronic back pain    .  GERD (gastroesophageal reflux disease)      takes Omeprazole every other day   .  Gastric ulcer      in the 60's   .  History of colon polyps    .  History of kidney stones    .  Kidney stone      now lodged on left side but so low that it won't move per pt and wife   .  Cataracts, bilateral    .  Hard of hearing      doesn't wear hearing aids   .  Sleep apnea     Past Surgical History   Procedure  Laterality  Date   .  Neck surgery   1962   .  Back surgery   2007   .  Microthermo prostate   2009   .  Tonsillectomy       at age 17   .  Colonoscopy     .  Eye surgery       implants   .  Coronary artery bypass graft   2009     LIMA to LAD, SVG to D1 07/2007   .  Thoracic discectomy  N/A  03/10/2013     Procedure: Thoracic twelve-Lumbar one Laminectomy; Surgeon: Erline Levine, MD; Location: [facility]  NEURO ORS; Service: Neurosurgery; Laterality: N/A; Thoracic twelve-Lumbar one Laminectomy    History reviewed. No pertinent family history.  Social History: Married. 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 He does not have any smokeless tobacco history on file. He reports that he does not drink alcohol or use illicit drugs.  Allergies   Allergen  Reactions   .  Contrast Media [Iodinated Diagnostic Agents]  Hives, Itching and Rash   .  Codeine  Nausea And Vomiting    Medications Prior to Admission   Medication  Sig  Dispense  Refill   .  aspirin 81 MG tablet  Take 81 mg by mouth daily.     Marland Kitchen  gabapentin (NEURONTIN) 300 MG capsule  Take 300 mg by mouth 3 (three) times daily.     .  hydrochlorothiazide (MICROZIDE) 12.5 MG capsule  Take 12.5 mg by mouth daily.     Marland Kitchen  omeprazole (PRILOSEC OTC) 20 MG tablet  Take 10 mg by mouth every other day.     .  simvastatin (ZOCOR) 40 MG tablet   Take 40 mg by mouth daily.      Home:  Home Living  Family/patient expects to be discharged to:: Private residence  Living Arrangements: Spouse/significant other;Other relatives (also 44 year old grandaughter 3 days/week)  Available Help at Discharge: Family;Available 24 hours/day  Type of Home: House  Home Access: Stairs to enter  [employer] of Steps: 4  Entrance Stairs-Rails: Right;Left  Home Layout: One level  Home Equipment: Cane - single point;Walker - standard;Bedside commode;Other (comment)  Functional History:  Prior Function  Comments: used cane at times; able to ambulate community distances PTA  Functional Status:  Mobility:    Ambulation/Gait  Ambulation Distance (Feet): 250 Feet  Gait velocity: Slow gait speed  General Gait Details: cues for upright posture, positioning within RW. Noted bucking of R leg x3 with walking. One seated rest break   ADL:  ADL  Upper Body Dressing: Performed;Set up  Where Assessed - Upper Body Dressing: Unsupported sitting  Lower Body Dressing: Minimal assistance  Where Assessed - Lower Body Dressing: Supported sit to stand  Toilet Transfer: Performed;Minimal assistance  Toilet Transfer Method: Sit to Surveyor, quantity: Comfort height toilet;Grab bars  Tub/Shower Transfer Method: Not assessed  Equipment Used: Gait belt;Back brace;Rolling walker;Reacher;Long-handled sponge;Long-handled shoe horn;Sock aid  Transfers/Ambulation Related to ADLs: min a secondary to pt. has tendancy to increase speed of ambulation and requires cues to slow down for safe sequencing of steps with rw, and with sit/stand  ADL Comments: able to doff brace in un supported sitting, all aspects of toileting min a/min guard a for safety cues. wife present and appears with increased anxiety for d/c home. con't. to state "he's wobbly isnt he, i mean dont you think" pts. spouse verbalized wanting to have pt. have con't. therapy prior to d/c home   Cognition:  Cognition  Overall Cognitive Status: Within Functional Limits for tasks assessed  Orientation Level: Oriented X4  Cognition  Arousal/Alertness: Awake/alert  Behavior During Therapy: WFL for tasks assessed/performed  Overall Cognitive Status: Within Functional Limits for tasks assessed    Physical Exam:  Blood pressure 109/61, pulse 83, temperature 98.2 F (36.8 C), temperature source Oral, resp. rate 18, height 6' 0.05" (1.83 m), weight 82.5 kg (181 lb 14.1 oz), SpO2 100.00%.   Constitutional: He is oriented to person, place, and time. He appears well-developed and well-nourished.  HENT:  Head: Normocephalic and atraumatic.  Right Ear: External ear normal.  Left Ear: External ear normal.  Eyes: Conjunctivae and EOM are normal. Pupils are equal, round, and reactive to light.  Neck: Normal range of motion. Neck supple.  Cardiovascular: Normal rate, regular rhythm and normal heart sounds.  Respiratory: Effort normal and breath sounds normal.  GI: Soft. Bowel sounds are normal. He exhibits no distension. There is no tenderness. There is no rebound and no guarding.  Musculoskeletal:  Back incision remains CDI. Honey comb dressing still in place. Neurological: He is alert and oriented to person, place, and time. He has normal reflexes. No cranial nerve deficit. He exhibits normal muscle tone.  5/5 bilateral deltoid, bicep, tricep, grip 4/5 bilateral hip flexor knee extensor and, ankle dorsiflexion and plantarflexion. Sensation 1+ RLE, 2- LLE. Right thigh/knee area somewhat sensitive to touch. Skin: Skin is warm and dry. Prior sternotomy scar noted.  Psychiatric: He has a normal mood and affect. His behavior is normal.      Post Admission Physician Evaluation:  1. Functional deficits secondary to thoraco-lumbar stenosis with myelopathy, pt s/p lumbar revision and decompression. 2. Patient is admitted to receive collaborative, interdisciplinary care between the physiatrist,  rehab nursing staff, and therapy team. 3. Patient's level of medical complexity and substantial therapy needs in context of that medical necessity cannot be provided at a lesser intensity of care such as a SNF. 4. Patient has experienced substantial functional loss from his/her baseline which was documented above under the "Functional History" and "Functional Status" headings. Judging by the patient's diagnosis, physical exam, and functional history, the patient has potential for functional progress which will result in measurable gains while on inpatient rehab. These gains will be of substantial and practical use upon discharge in facilitating mobility and self-care at the household level. 5. Physiatrist will provide 24 hour management of medical needs as well as oversight of the therapy plan/treatment and provide guidance as appropriate regarding the interaction of the two. 6. 24 hour rehab nursing will assist with bladder management, bowel management, safety, skin/wound care, disease management, medication administration, pain management and patient education and help integrate therapy concepts, techniques,education, etc. 7. PT will assess and treat for/with: Lower extremity strength, range of motion, stamina, balance, functional mobility, safety, adaptive techniques and equipment, NMR, pain mgt, back precautions. Goals are: mod I. 8. OT will assess and treat for/with: ADL's, functional mobility, safety, upper extremity strength, adaptive techniques and equipment, NMR, back precautions, education. Goals are: mod I to supervision. 9. SLP will assess and treat for/with: n/a. Goals are: n/a. 10. Case Management and Social Worker will assess and treat for psychological issues and discharge planning. 11. Team conference will be held weekly to assess progress toward goals and to determine barriers to discharge. 12. Patient will receive at least 3 hours of therapy per day at least 5 days per week. 13. ELOS:  7-8 days  14. Prognosis: excellent   Medical Problem List and Plan:  1. DVT Prophylaxis/Anticoagulation: Pharmaceutical: Lovenox  2. Pain Management: On neurontin tid. Continue prn vicodin. Monitor for confusion/titrate medications to effect. 3. Mood: Provide ego support to patient and family. LCSW to follow for evaluation and support.  4. Neuropsych: This patient is capable of making decisions on his own behalf.  5. HTN: Monitor with bid checks. Continue HCTZ.  6. Urinary retention: Will discontinue foley in am and start bladder training. Check UA/UCS to rule out UTI as cause of retention.  7. GERD: Continue protonix.  8. Constipation: Sorbitol today follow with enema if no results. Continue senna s.   Meredith Staggers, MD, [place]   03/14/2013

## 2013-03-14 NOTE — Progress Notes (Signed)
Physical Therapy Treatment Patient Details Name: Jason Cross MRN: [id number] DOB: 10/26/1928 Today's Date: 03/14/2013 Time: 4098-1191 PT Time Calculation (min): 24 min  PT Assessment / Plan / Recommendation  History of Present Illness Lumbar three-four, Lumbar four-five Redo decompression/Fusion, Thoracic twelve-Lumbar one Laminectomy PMHx peripheral neuropathy, CAD/CABG, neck and back surgery   PT Comments   Patient progressing today. Still with some evident weakness of LEs with ambulation. Patient concerned about wifes anxiety level at home as she is not able to care much for him. Patient is awaiting CIR recommendations. Will follow up with current POC and await CIR decision.   Follow Up Recommendations  CIR     Does the patient have the potential to tolerate intense rehabilitation     Barriers to Discharge        Equipment Recommendations  Rolling walker with 5" wheels    Recommendations for Other Services    Frequency     Progress towards PT Goals Progress towards PT goals: Progressing toward goals  Plan Current plan remains appropriate    Precautions / Restrictions Precautions Precautions: Back;Fall Precaution Comments: pt verbalized 3/3 back precautions.   Required Braces or Orthoses: Spinal Brace Spinal Brace: Lumbar corset;Applied in sitting position   Pertinent Vitals/Pain no apparent distress     Mobility  Bed Mobility Rolling: Supervision Sidelying to sit: Supervision General bed mobility comments: min verbal cues for log roll into bed. able to bring b les into bed without assistance Transfers Overall transfer level: Needs assistance Equipment used: Rolling walker (2 wheeled) Transfers: Sit to/from Stand Sit to Stand: Min assist;Min guard General transfer comment: cues for tech. Ambulation/Gait Ambulation/Gait assistance: Min assist Ambulation Distance (Feet): 250 Feet Assistive device: Rolling walker (2 wheeled) Gait Pattern/deviations: Step-through  pattern;Trunk flexed;Decreased stride length General Gait Details: cues for upright posture, positioning within RW.  Noted bucking of R leg x3 with walking. One seated rest break    Exercises     PT Diagnosis:    PT Problem List:   PT Treatment Interventions:     PT Goals (current goals can now be found in the care plan section)    Visit Information  Last PT Received On: 03/14/13 Assistance Needed: +1 History of Present Illness: Lumbar three-four, Lumbar four-five Redo decompression/Fusion, Thoracic twelve-Lumbar one Laminectomy PMHx peripheral neuropathy, CAD/CABG, neck and back surgery    Subjective Data      Cognition  Cognition Arousal/Alertness: Awake/alert Behavior During Therapy: WFL for tasks assessed/performed Overall Cognitive Status: Within Functional Limits for tasks assessed    Balance     End of Session PT - End of Session Equipment Utilized During Treatment: Gait belt;Back brace Activity Tolerance: Patient tolerated treatment well Patient left: in bed;with call bell/phone within reach;with family/visitor present Nurse Communication: Mobility status   GP     Jacqualyn Posey 03/14/2013, 9:15 AM 03/14/2013 Jacqualyn Posey PTA [phone] pager [phone] office

## 2013-03-14 NOTE — Progress Notes (Signed)
Subjective: Patient reports "I want to start coming off my pain medicine. I've always had arthritic pain"  Objective: Vital signs in last 24 hours: Temp:  [97.8 F (36.6 C)-98.1 F (36.7 C)] 97.8 F (36.6 C) (01/20 0536) Pulse Rate:  [71-98] 71 (01/20 0536) Resp:  [16-20] 18 (01/20 0146) BP: (111-137)/(59-67) 112/59 mmHg (01/20 0536) SpO2:  [94 %-98 %] 96 % (01/20 0536)  Intake/Output from previous day: 01/19 0701 - 01/20 0700 In: 1860 [P.O.:660] Out: -  Intake/Output this shift:    Awakens to voice, reporting minimal lumbar pain at present. MAEW, good strength BLE. Incision with intact honeycomb drsg, no erythema, swelling, or drainage. Drain site drsg intact, dry. Pt reports walking with PT in hall yesterday. Also notes small BM yesterday.  Lab Results: No results found for this basename: WBC, HGB, HCT, PLT,  in the last 72 hours BMET No results found for this basename: NA, K, CL, CO2, GLUCOSE, BUN, CREATININE, CALCIUM,  in the last 72 hours  Studies/Results: No results found.  Assessment/Plan: Improving   LOS: 4 days  Continue to mobilize in LSO with PT today, working on bowels. Will address SNF vs Home based on his progress today as pt indicates he did not meet CIR criteria.   Verdis Prime 03/14/2013, 7:08 AM

## 2013-03-14 NOTE — Progress Notes (Signed)
Occupational Therapy Treatment Patient Details Name: Jason Cross MRN: [id number] DOB: 04-Sep-1928 Today's Date: 03/14/2013 Time: 4098-1191 OT Time Calculation (min): 24 min  OT Assessment / Plan / Recommendation  History of present illness Lumbar three-four, Lumbar four-five Redo decompression/Fusion, Thoracic twelve-Lumbar one Laminectomy PMHx peripheral neuropathy, CAD/CABG, neck and back surgery   OT comments  Pt. Progressing well, but pt. And spouse con't. To express need for con't. Therapy services prior to d/c home.  Appears to be some confusion if pt. Has been approved for cir or will need snf placement.  Wife presents with visible anxiety which transfers to pt. During andy functional tasks in room.  Encouraged wife to be more hands on during session for care giver ed but she remained hesitant and con't. To panic.   Follow Up Recommendations  CIR;SNF pt.s wife verbalizing concerns for d/c home and wants pt. To have con't. Therapy prior to d/c home.  Pt. Is agreeable to this. They are confused if he has been approved for CIR therapy or not.                 Recommendations for Other Services Rehab consult  Frequency Min 2X/week   Progress towards OT Goals Progress towards OT goals: Progressing toward goals  Plan Discharge plan needs to be updated    Precautions / Restrictions Precautions Precautions: Back;Fall Precaution Comments: pt verbalized 3/3 back precautions.   Required Braces or Orthoses: Spinal Brace Spinal Brace: Lumbar corset;Applied in sitting position   Pertinent Vitals/Pain 2-4/10, states "i plan on getting off of my pain meds you know"    ADL  Upper Body Dressing: Performed;Set up Where Assessed - Upper Body Dressing: Unsupported sitting Toilet Transfer: Performed;Minimal assistance Toilet Transfer Method: Sit to stand Toilet Transfer Equipment: Comfort height toilet;Grab bars Toileting - Clothing Manipulation and Hygiene: Performed;Min guard Where  Assessed - Camera operator Manipulation and Hygiene: Standing Transfers/Ambulation Related to ADLs: min a secondary to pt. has tendancy to increase speed of ambulation and requires cues to slow down for safe sequencing of steps with rw, and with sit/stand ADL Comments: able to doff brace in un supported sitting, all aspects of toileting min a/min guard a for safety cues.  wife present and appears with increased anxiety for d/c home.  con't. to state "he's wobbly isnt he, i mean dont you think" pts. spouse verbalized wanting to have pt. have con't. therapy prior to d/c home     OT Goals(current goals can now be found in the care plan section)    Visit Information  Last OT Received On: 03/14/13 History of Present Illness: Lumbar three-four, Lumbar four-five Redo decompression/Fusion, Thoracic twelve-Lumbar one Laminectomy PMHx peripheral neuropathy, CAD/CABG, neck and back surgery    Subjective Data   "if my legs were stronger i know i would be okay"          Cognition  Cognition Arousal/Alertness: Awake/alert Behavior During Therapy: WFL for tasks assessed/performed Overall Cognitive Status: Within Functional Limits for tasks assessed    Mobility  Bed Mobility General bed mobility comments: min verbal cues for log roll into bed. able to bring b les into bed without assistance Transfers Overall transfer level: Needs assistance Equipment used: Rolling walker (2 wheeled) Transfers: Sit to/from Stand Sit to Stand: Min assist;Min guard General transfer comment: cues for tech.              End of Session OT - End of Session Equipment Utilized During Treatment: Rolling walker;Back brace Activity Tolerance: Patient  tolerated treatment well Patient left: in bed;with call bell/phone within reach;with family/visitor present       Janice Coffin, COTA/L 03/14/2013, 9:07 AM

## 2013-03-15 ENCOUNTER — Inpatient Hospital Stay (HOSPITAL_COMMUNITY): Payer: Medicare Other | Admitting: *Deleted

## 2013-03-15 ENCOUNTER — Inpatient Hospital Stay (HOSPITAL_COMMUNITY): Payer: Medicare Other | Admitting: Occupational Therapy

## 2013-03-15 ENCOUNTER — Inpatient Hospital Stay (HOSPITAL_COMMUNITY): Payer: Medicare Other

## 2013-03-15 DIAGNOSIS — IMO0002 Reserved for concepts with insufficient information to code with codable children: Secondary | ICD-10-CM

## 2013-03-15 DIAGNOSIS — N319 Neuromuscular dysfunction of bladder, unspecified: Secondary | ICD-10-CM

## 2013-03-15 DIAGNOSIS — M4712 Other spondylosis with myelopathy, cervical region: Secondary | ICD-10-CM

## 2013-03-15 LAB — CBC WITH DIFFERENTIAL/PLATELET
Basophils Absolute: 0 10*3/uL (ref 0.0–0.1)
Basophils Relative: 0 % (ref 0–1)
EOS PCT: 3 % (ref 0–5)
Eosinophils Absolute: 0.4 10*3/uL (ref 0.0–0.7)
HEMATOCRIT: 32.3 % — AB (ref 39.0–52.0)
Hemoglobin: 10.8 g/dL — ABNORMAL LOW (ref 13.0–17.0)
LYMPHS PCT: 24 % (ref 12–46)
Lymphs Abs: 2.7 10*3/uL (ref 0.7–4.0)
MCH: 29.8 pg (ref 26.0–34.0)
MCHC: 33.4 g/dL (ref 30.0–36.0)
MCV: 89 fL (ref 78.0–100.0)
MONO ABS: 1 10*3/uL (ref 0.1–1.0)
MONOS PCT: 9 % (ref 3–12)
Neutro Abs: 6.9 10*3/uL (ref 1.7–7.7)
Neutrophils Relative %: 63 % (ref 43–77)
Platelets: 211 10*3/uL (ref 150–400)
RBC: 3.63 MIL/uL — ABNORMAL LOW (ref 4.22–5.81)
RDW: 13.6 % (ref 11.5–15.5)
WBC: 10.9 10*3/uL — AB (ref 4.0–10.5)

## 2013-03-15 LAB — COMPREHENSIVE METABOLIC PANEL
ALT: 12 U/L (ref 0–53)
AST: 18 U/L (ref 0–37)
Albumin: 2.9 g/dL — ABNORMAL LOW (ref 3.5–5.2)
Alkaline Phosphatase: 82 U/L (ref 39–117)
BUN: 34 mg/dL — AB (ref 6–23)
CALCIUM: 8.6 mg/dL (ref 8.4–10.5)
CO2: 23 meq/L (ref 19–32)
CREATININE: 1.5 mg/dL — AB (ref 0.50–1.35)
Chloride: 102 mEq/L (ref 96–112)
GFR, EST AFRICAN AMERICAN: 48 mL/min — AB (ref >90)
GFR, EST NON AFRICAN AMERICAN: 42 mL/min — AB (ref >90)
GLUCOSE: 94 mg/dL (ref 70–99)
Potassium: 4.3 mEq/L (ref 3.7–5.3)
Sodium: 138 mEq/L (ref 137–147)
Total Bilirubin: 0.7 mg/dL (ref 0.3–1.2)
Total Protein: 6 g/dL (ref 6.0–8.3)

## 2013-03-15 LAB — URINALYSIS, ROUTINE W REFLEX MICROSCOPIC
Bilirubin Urine: NEGATIVE
Glucose, UA: NEGATIVE mg/dL
HGB URINE DIPSTICK: NEGATIVE
Ketones, ur: NEGATIVE mg/dL
Nitrite: NEGATIVE
PROTEIN: NEGATIVE mg/dL
Specific Gravity, Urine: 1.016 (ref 1.005–1.030)
Urobilinogen, UA: 0.2 mg/dL (ref 0.0–1.0)
pH: 6 (ref 5.0–8.0)

## 2013-03-15 LAB — URINE MICROSCOPIC-ADD ON

## 2013-03-15 MED ORDER — SODIUM CHLORIDE 0.45 % IV SOLN
INTRAVENOUS | Status: DC
Start: 2013-03-15 — End: 2013-03-18
  Administered 2013-03-15 – 2013-03-17 (×3): via INTRAVENOUS

## 2013-03-15 MED ORDER — GABAPENTIN 100 MG PO CAPS
200.0000 mg | ORAL_CAPSULE | Freq: Three times a day (TID) | ORAL | Status: DC
  Administered 2013-03-15 – 2013-03-18 (×9): 200 mg via ORAL
  Filled 2013-03-15 (×12): qty 2

## 2013-03-15 NOTE — Evaluation (Addendum)
Occupational Therapy Assessment and Plan  Patient Details  Name: Jason Cross MRN: [id number] Date of Birth: 12/22/1928  OT Diagnosis: acute pain Rehab Potential: Rehab Potential: Excellent ELOS: 7-9 days   Today's Date: 03/15/2013 Time: 7:30- 8:30 Time Calculation (min): 60 min  Problem List:  Patient Active Problem List   Diagnosis Date Noted  . Lumbar spondylolysis 03/14/2013  . Lumbar scoliosis 03/10/2013    Past Medical History:  Past Medical History  Diagnosis Date  . Hyperlipidemia     takes Simvastatin daily  . Hypertension     takes HCTZ daily  . Coronary artery disease   . Peripheral neuropathy     takes Gabapentin daily  . Myocardial infarction   . COPD (chronic obstructive pulmonary disease)   . Pneumonia     hx of in the 60's  . Dizziness   . Arthritis   . Chronic back pain   . GERD (gastroesophageal reflux disease)     takes Omeprazole every other day  . Gastric ulcer     in the 60's  . History of colon polyps   . History of kidney stones   . Kidney stone     now lodged on left side but so low that it won't move per pt and wife  . Cataracts, bilateral   . Hard of hearing     doesn't wear hearing aids  . Sleep apnea    Past Surgical History:  Past Surgical History  Procedure Laterality Date  . Neck surgery  1962  . Back surgery  2007  . Microthermo prostate  2009  . Tonsillectomy      at age 16  . Colonoscopy    . Eye surgery      implants  . Coronary artery bypass graft  2009    LIMA to LAD, SVG to D1 07/2007  . Thoracic discectomy N/A 03/10/2013    Procedure: Thoracic twelve-Lumbar one Laminectomy;  Surgeon: Joseph Stern, MD;  Location: [facility];  Service: Neurosurgery;  Laterality: N/A;  Thoracic twelve-Lumbar one Laminectomy    Assessment & Plan Clinical Impression:   Jason Cross is a 80 y.o. male with history of HTN, peripheral neuropathy, severe back pain radiating to BLE. Patient with spondylosis and cord compression at  T12-L1 and severe spinal stenosis and marked facet hypertrophy at the L3-L5 levels. He elected to undergo redo lumbar decompressive fusion L4-5, and T12-L1 laminectomy with L3-L5 posterolateral arthrodesis on 03/10/13 by Dr. Stern. Post op with improvement in pain management but with urinary retention requiring foley placement. Has had some confusion due to narcotics therefore dilaudid d/c. MD, PT, recommending CIR. Patient states he has no other pains beside his lower back area. He does have hypersensitivity in the right leg. No arm weakness. Mild leg weakness. Wife at home can assist but is apprehensive. After screening by the rehab service, the patient was ultimately admitted to CIR. Patient transferred to CIR on 03/14/2013   Patient currently requires mod with basic self-care skills secondary to muscle weakness, decreased cardiorespiratoy endurance, and decreased standing balance, decreased postural control, decreased balance strategies and difficulty maintaining precautions.  Prior to hospitalization, patient could complete ADLS with independent .  Patient will benefit from skilled intervention to decrease level of assist with basic self-care skills and increase independence with basic self-care skills prior to discharge home with care partner.  Anticipate patient will require intermittent supervision and follow up home health.  OT - End of Session Activity Tolerance: Improving Endurance Deficit:   Yes Endurance Deficit Description: fatigues quickly and requires frequent rest breaks OT Assessment Rehab Potential: Excellent OT Patient demonstrates impairments in the following area(s): Balance;Endurance;Pain;Safety OT Basic ADL's Functional Problem(s): Grooming;Bathing;Dressing;Toileting OT Transfers Functional Problem(s): Toilet;Tub/Shower OT Plan OT Intensity: Minimum of 1-2 x/day, 45 to 90 minutes OT Frequency: 5 out of 7 days OT Duration/Estimated Length of Stay: 7-9 days OT  Treatment/Interventions: Balance/vestibular training;Community reintegration;Discharge planning;DME/adaptive equipment instruction;Functional mobility training;Neuromuscular re-education;Pain management;Patient/family education;Self Care/advanced ADL retraining;Therapeutic Activities;Therapeutic Exercise;Wheelchair propulsion/positioning OT Self Feeding Anticipated Outcome(s): independent OT Basic Self-Care Anticipated Outcome(s): setup OT Toileting Anticipated Outcome(s): setup OT Bathroom Transfers Anticipated Outcome(s): setup OT Recommendation Patient destination: Home Follow Up Recommendations: Home health OT Equipment Recommended: Wheelchair cushion (measurements);Wheelchair (measurements);To be determined   Skilled Therapeutic Intervention 1:1 Self care retraining: Pt requires verbal cueing throughout session for maintaining back precautions but can verbalize 3 out 3 precautions to therapist. Pt demonstrates need for (A) for sit<>stand without DME at MOD (A) level. Pt c/o need to void bowel but without void x2 attempts during session. Pt completing shower on shower seat and required MOd (A) and grab bar to static stand. Pt required frequent rest breaks due to fatigue. Fatigue with sit<>stand 9 times during session. Pt could benefit from education using reacher to dress LB. Pt reports having reacher at home.   OT Evaluation Precautions/Restrictions  Precautions Precautions: Back Precaution Booklet Issued: No Precaution Comments: pt verbalizes 2/3 back precautions Required Braces or Orthoses: Spinal Brace Spinal Brace: Lumbar corset;Applied in sitting position Restrictions Weight Bearing Restrictions: No General   Vital Signs  BP 89/60 reports dizziness Pt provided ted hose BP 93/63- no longer dizzy PA Pam love notified of ted hose don during session Pain Pain Assessment Pain Assessment: 2 out 10 - it doesn't hurt its just uncomfortable Lt knee with don of sock 8 out 10  pain Home Living/Prior Functioning Home Living Available Help at Discharge: Family;Available 24 hours/day Type of Home: House Home Access: Stairs to enter Entrance Stairs-Number of Steps: 2 steps at the backdoor which patient reports he uses, but no handrails; 5 steps at front door with B handrails Entrance Stairs-Rails: Right;Left;None Home Layout: One level Additional Comments: Unsure if patient is accurate historian  Lives With: Spouse IADL History Homemaking Responsibilities: Yes Meal Prep Responsibility: No Laundry Responsibility: No Cleaning Responsibility: Secondary Bill Paying/Finance Responsibility: Secondary Shopping Responsibility: No Child Care Responsibility: No Current License: Yes Mode of Transportation: Car Occupation: Retired Leisure and Hobbies: mow the lawn, wash cars, go to church go out to eat Prior Function Level of Independence: Independent with transfers;Independent with gait  Able to Take Stairs?: Yes Driving: Yes Vocation: Retired Comments: used a cane in the community at times ADL ADL ADL Comments: See FIM score Vision/Perception  Vision - History Baseline Vision: Wears glasses only for reading Visual History: Cataracts Patient Visual Report: No change from baseline  Cognition Overall Cognitive Status: Within Functional Limits for tasks assessed Arousal/Alertness: Awake/alert Orientation Level: Oriented X4 Sensation Sensation Light Touch: Appears Intact Proprioception: Appears Intact Coordination Gross Motor Movements are Fluid and Coordinated: Yes Fine Motor Movements are Fluid and Coordinated: Yes Motor  Motor Motor: Within Functional Limits Motor - Skilled Clinical Observations: note tremor with fine motor task opening containers but able to complete task without (A) Mobility  Bed Mobility Bed Mobility: Supine to Sit;Sitting - Scoot to Edge of Bed Supine to Sit: 4: Min assist;HOB flat;With rails Supine to Sit Details: Verbal cues  for sequencing;Verbal cues for precautions/safety Supine to Sit Details (  indicate cue type and reason): pt attempting to long sit and needed cues to log roll for back precautions Sitting - Scoot to Edge of Bed: 4: Min guard;With rail Transfers Sit to Stand: 3: Mod assist;With upper extremity assist;From bed Sit to Stand Details: Verbal cues for sequencing;Manual facilitation for weight shifting Sit to Stand Details (indicate cue type and reason): Pt with posterior lean and reaching for unsafe environmental support. Pt offered hand held (A)  Stand to Sit: 4: Min assist;With upper extremity assist;To chair/3-in-1 Stand to Sit Details (indicate cue type and reason): Verbal cues for sequencing;Verbal cues for precautions/safety Stand to Sit Details: v/c for hand placement and control descend  Trunk/Postural Assessment  Cervical Assessment Cervical Assessment: Within Functional Limits (hx of surg) Thoracic Assessment Thoracic Assessment: Within Functional Limits Lumbar Assessment Lumbar Assessment: Within Functional Limits Postural Control Postural Control: Deficits on evaluation  Balance Balance Balance Assessed: No Static Standing Balance Static Standing - Balance Support: No upper extremity supported;During functional activity Static Standing - Level of Assistance: 3: Mod assist Extremity/Trunk Assessment RUE Assessment RUE Assessment: Within Functional Limits LUE Assessment LUE Assessment: Not tested  FIM:  FIM - Eating Eating Activity: 7: Complete independence:no helper FIM - Grooming Grooming Steps: Wash, rinse, dry face;Wash, rinse, dry hands;Brush, comb hair Grooming: 5: Set-up assist to obtain items FIM - Bathing Bathing Steps Patient Completed: Chest;Right Arm;Left Arm;Abdomen;Front perineal area;Buttocks;Right upper leg;Left upper leg Bathing: 4: Min-Patient completes 8-9 0f 10 parts or 75+ percent FIM - Upper Body Dressing/Undressing Upper body dressing/undressing  steps patient completed: Thread/unthread right sleeve of pullover shirt/dresss;Thread/unthread left sleeve of pullover shirt/dress;Put head through opening of pull over shirt/dress;Pull shirt over trunk Upper body dressing/undressing: 5: Set-up assist to: Obtain clothing/put away FIM - Lower Body Dressing/Undressing Lower body dressing/undressing: 1: Total-Patient completed less than 25% of tasks FIM - Toileting Toileting steps completed by patient: Performs perineal hygiene Toileting Assistive Devices: Grab bar or rail for support Toileting: 2: Max-Patient completed 1 of 3 steps FIM - Bed/Chair Transfer Bed/Chair Transfer Assistive Devices: Bed rails;Walker Bed/Chair Transfer: 5: Supine > Sit: Supervision (verbal cues/safety issues);5: Sit > Supine: Supervision (verbal cues/safety issues);4: Bed > Chair or W/C: Min A (steadying Pt. > 75%);4: Chair or W/C > Bed: Min A (steadying Pt. > 75%) FIM - Toilet Transfers Toilet Transfers: 3-To toilet/BSC: Mod A (lift or lower assist);3-From toilet/BSC: Mod A (lift or lower assist) FIM - Tub/Shower Transfers Tub/Shower Assistive Devices: Walk in shower;Shower chair Tub/shower Transfers: 3-Into Tub/Shower: Mod A (lift or lower/lift 2 legs);3-Out of Tub/Shower: Mod A (lift or lower/lift 2 legs)   Refer to Care Plan for Long Term Goals STG=LTG  Recommendations for other services: None  Discharge Criteria: Patient will be discharged from OT if patient refuses treatment 3 consecutive times without medical reason, if treatment goals not met, if there is a change in medical status, if patient makes no progress towards goals or if patient is discharged from hospital.  The above assessment, treatment plan, treatment alternatives and goals were discussed and mutually agreed upon: by patient  ,  B 03/15/2013, 2:49 PM  Pager: [phone]  

## 2013-03-15 NOTE — Evaluation (Signed)
Physical Therapy Assessment and Plan  Patient Details  Name: Jason Cross MRN: [id number] Date of Birth: 1929-02-14  PT Diagnosis: Difficulty walking, Low back pain and Muscle weakness Rehab Potential: Good ELOS: 5-7 days   Today's Date: 03/15/2013 Time: 0900-1000 Time Calculation (min): 60 min  Problem List:  Patient Active Problem List   Diagnosis Date Noted  . Lumbar spondylolysis 03/14/2013  . Lumbar scoliosis 03/10/2013    Past Medical History:  Past Medical History  Diagnosis Date  . Hyperlipidemia     takes Simvastatin daily  . Hypertension     takes HCTZ daily  . Coronary artery disease   . Peripheral neuropathy     takes Gabapentin daily  . Myocardial infarction   . COPD (chronic obstructive pulmonary disease)   . Pneumonia     hx of in the 60's  . Dizziness   . Arthritis   . Chronic back pain   . GERD (gastroesophageal reflux disease)     takes Omeprazole every other day  . Gastric ulcer     in the 60's  . History of colon polyps   . History of kidney stones   . Kidney stone     now lodged on left side but so low that it won't move per pt and wife  . Cataracts, bilateral   . Hard of hearing     doesn't wear hearing aids  . Sleep apnea    Past Surgical History:  Past Surgical History  Procedure Laterality Date  . Neck surgery  1962  . Back surgery  2007  . Microthermo prostate  2009  . Tonsillectomy      at age 23  . Colonoscopy    . Eye surgery      implants  . Coronary artery bypass graft  2009    LIMA to LAD, SVG to D1 07/2007  . Thoracic discectomy N/A 03/10/2013    Procedure: Thoracic twelve-Lumbar one Laminectomy;  Surgeon: Erline Levine, MD;  Location: [place] [facility];  Service: Neurosurgery;  Laterality: N/A;  Thoracic twelve-Lumbar one Laminectomy    Assessment & Plan Clinical Impression:  Jason Cross is a 80 y.o. male with history of HTN, peripheral neuropathy, severe back pain radiating to BLE. Patient with spondylosis and cord  compression at T12-L1 and severe spinal stenosis and marked facet hypertrophy at the L3-L5 levels. He elected to undergo redo lumbar decompressive fusion L4-5, and T12-L1 laminectomy with L3-L5 posterolateral arthrodesis on 03/10/13 by Dr. Vertell Limber. Post op with improvement in pain management but with urinary retention requiring foley placement. Has had some confusion due to narcotics therefore dilaudid d/c. MD, PT, recommending CIR. Patient states he has no other pains beside his lower back area. He does have hypersensitivity in the right leg. No arm weakness. Mild leg weakness. Wife at home can assist but is apprehensive. After screening by the rehab service, the patient was ultimately admitted to CIR. Patient transferred to CIR on 03/14/2013 .   Patient currently requires min with mobility secondary to muscle weakness and decreased cardiorespiratoy endurance.  Prior to hospitalization, patient was independent  with mobility and lived with Spouse in a House home.  Home access is 2 steps at the backdoor which patient reports he uses, but no handrails; 5 steps at front door with B handrailsStairs to enter.  Patient will benefit from skilled PT intervention to maximize safe functional mobility, minimize fall risk and decrease caregiver burden for planned discharge home with 24 hour assist.  Anticipate patient  will benefit from follow up [place] at discharge.  PT - End of Session Activity Tolerance: Tolerates 30+ min activity with multiple rests Endurance Deficit: Yes Endurance Deficit Description: fatigues quickly and requires frequent rest breaks PT Assessment Rehab Potential: Good Barriers to Discharge Comments: wife can provide min A PT Patient demonstrates impairments in the following area(s): Balance;Endurance;Motor;Pain;Safety;Sensory PT Transfers Functional Problem(s): Bed Mobility;Bed to Chair;Car;Furniture PT Locomotion Functional Problem(s): Ambulation;Wheelchair Mobility;Stairs PT Plan PT  Intensity: Minimum of 1-2 x/day ,45 to 90 minutes PT Frequency: 5 out of 7 days PT Duration Estimated Length of Stay: 5-7 days PT Treatment/Interventions: Ambulation/gait training;Balance/vestibular training;Cognitive remediation/compensation;Community reintegration;Discharge planning;Neuromuscular re-education;Functional mobility training;DME/adaptive equipment instruction;Pain management;Patient/family education;Psychosocial support;Skin care/wound management;Splinting/orthotics;UE/LE Coordination activities;UE/LE Strength taining/ROM;Therapeutic Exercise;Therapeutic Activities;Wheelchair propulsion/positioning;Stair training PT Transfers Anticipated Outcome(s): mod I PT Locomotion Anticipated Outcome(s): mod I household ambulation, supervision stairs and community ambulation PT Recommendation Follow Up Recommendations: Home health PT Patient destination: Home Equipment Recommended: Rolling walker with 5" wheels;To be determined Equipment Details: Patient owns [facility] and standard walker. Further DME recommendations TBD [facility] discharge  Skilled Therapeutic Intervention Skilled therapeutic intervention initiated after completion of evaluation. See below for vitals. BP in sitting: 95/61, patient asymptomatic. Patient transferred to toilet with minA, able to manage brief and pants without assistance, steady assist for standing balance when doing so. Discussed falls risk, safety within room, and focused of therapy during stay. Discussed possible LOS, goals, and f/u therapy. Patient left seated in recliner with all needs within reach.  PT Evaluation Precautions/Restrictions Precautions Precautions: Back Precaution Comments: pt verbalizes 2/3 back precautions Required Braces or Orthoses: Spinal Brace Spinal Brace: Lumbar corset;Applied in sitting position Restrictions Weight Bearing Restrictions: No General Chart Reviewed: Yes Family/Caregiver Present: No  Vital SignsTherapy Vitals Pulse Rate:  92 BP: 115/74 mmHg Patient Position, if appropriate: Lying Oxygen Therapy SpO2: 94 % O2 Device: None (Room air) Pain Pain Assessment Pain Assessment: No/denies pain Pain Score: 0-No pain Home Living/Prior Functioning Home Living Available Help at Discharge: Family;Available 24 hours/day Type of Home: House Home Access: Stairs to enter [employer] of Steps: 2 steps at the backdoor which patient reports he uses, but no handrails; 5 steps at front door with B handrails Entrance Stairs-Rails: Right;Left;None Home Layout: One level Additional Comments: Unsure if patient is accurate historian  Lives With: Spouse Prior Function Level of Independence: Independent with transfers;Independent with gait  Able to Take Stairs?: Yes Driving: Yes Vocation: Retired Comments: used a cane in the community at times Vision/Perception  Vision - History Baseline Vision: Wears glasses only for reading Visual History: Cataracts (cataracts surgery) Patient Visual Report: No change from baseline Vision - Assessment Eye Alignment: Within Functional Limits Perception Perception: Within Functional Limits Praxis Praxis: Intact  Cognition Overall Cognitive Status: Within Functional Limits for tasks assessed Arousal/Alertness: Awake/alert Orientation Level: Oriented X4 Memory: Appears intact Sensation Sensation Light Touch: Appears Intact Proprioception: Appears Intact Coordination Gross Motor Movements are Fluid and Coordinated: Yes Fine Motor Movements are Fluid and Coordinated: Yes Motor  Motor Motor: Within Functional Limits  Mobility Bed Mobility Bed Mobility: Supine to Sit;Sit to Supine Supine to Sit: HOB flat;With rails;5: Supervision Supine to Sit Details: Verbal cues for precautions/safety;Verbal cues for sequencing;Verbal cues for technique Sit to Supine: 5: Supervision;With rail;HOB flat Sit to Supine - Details: Verbal cues for precautions/safety;Verbal cues for  technique;Verbal cues for sequencing Transfers Transfers: Yes Sit to Stand: 4: Min assist;With upper extremity assist;From bed;From chair/3-in-1;With armrests;From toilet Sit to Stand Details: Verbal cues for sequencing;Verbal cues for technique;Verbal cues for  precautions/safety;Manual facilitation for weight shifting Stand to Sit: 4: Min assist;With upper extremity assist;With armrests;To chair/3-in-1;To toilet Stand to Sit Details (indicate cue type and reason): Verbal cues for sequencing;Manual facilitation for weight shifting;Verbal cues for technique;Verbal cues for precautions/safety Stand Pivot Transfers: 4: Min assist;With armrests (w/ RW) Stand Pivot Transfer Details: Verbal cues for technique;Verbal cues for precautions/safety;Verbal cues for sequencing;Manual facilitation for weight shifting Locomotion  Ambulation Ambulation: Yes Ambulation/Gait Assistance: 4: Min assist Ambulation Distance (Feet): 130 Feet Assistive device: Rolling walker Ambulation/Gait Assistance Details: Verbal cues for gait pattern;Verbal cues for precautions/safety;Tactile cues for posture Ambulation/Gait Assistance Details: Patient performed gait training 90' x1 and 130'x1 with RW in controlled environment. Verbal and tactile cues for upright posture and knee extension in stance. Gait Gait: Yes Gait Pattern: Impaired Gait Pattern: Step-through pattern;Narrow base of support;Trunk flexed;Right flexed knee in stance;Left flexed knee in stance Stairs / Additional Locomotion Stairs: Yes Stairs Assistance: 4: Min assist Stairs Assistance Details: Visual cues/gestures for sequencing;Verbal cues for sequencing;Verbal cues for technique;Verbal cues for precautions/safety;Verbal cues for gait pattern Stair Management Technique: One rail Right;Step to pattern;Sideways Number of Stairs: 5 Height of Stairs: 6 Wheelchair Mobility Wheelchair Mobility: Yes Wheelchair Assistance: 4: Administrator, sports  Details: Verbal cues for sequencing;Verbal cues for technique;Verbal cues for Engineer, drilling: Both upper extremities Wheelchair Parts Management: Needs assistance Distance: 40  Trunk/Postural Assessment  Cervical Assessment Cervical Assessment: Within Functional Limits Thoracic Assessment Thoracic Assessment: Within Functional Limits Lumbar Assessment Lumbar Assessment: Within Functional Limits Postural Control Postural Control: Within Functional Limits  Balance Balance Balance Assessed: Yes Static Sitting Balance Static Sitting - Balance Support: Right upper extremity supported;Feet supported Static Sitting - Level of Assistance: 6: Modified independent (Device/Increase time) Static Standing Balance Static Standing - Balance Support: Bilateral upper extremity supported Static Standing - Level of Assistance: 4: Min assist Extremity Assessment  RLE Assessment RLE Assessment: Exceptions to [facility] RLE Strength RLE Overall Strength: Deficits;Due to pain RLE Overall Strength Comments: Grossly 3/5, no MMT performed secondary to pain in B LEs with movement LLE Assessment LLE Assessment: Exceptions to [facility] LLE Strength LLE Overall Strength: Deficits;Due to pain LLE Overall Strength Comments: Grossly 3/5, no MMT performed secondary to pain in B LEs with movement  FIM:  FIM - Bed/Chair Transfer Bed/Chair Transfer Assistive Devices: Bed rails;Walker Bed/Chair Transfer: 5: Supine > Sit: Supervision (verbal cues/safety issues);5: Sit > Supine: Supervision (verbal cues/safety issues);4: Bed > Chair or W/C: Min A (steadying Pt. > 75%);4: Chair or W/C > Bed: Min A (steadying Pt. > 75%) FIM - Locomotion: Wheelchair Distance: 40 Locomotion: Wheelchair: 1: Travels less than 50 ft with minimal assistance (Pt.>75%) FIM - Locomotion: Ambulation Locomotion: Ambulation Assistive Devices: Designer, industrial/product Ambulation/Gait Assistance: 4: Min assist Locomotion: Ambulation: 2:  Travels 50 - 149 ft with minimal assistance (Pt.>75%) FIM - Locomotion: Stairs Locomotion: Building control surveyor: Hand rail - 1 Locomotion: Stairs: 2: Up and Down 4 - 11 stairs with minimal assistance (Pt.>75%)   Refer to Care Plan for Long Term Goals  Recommendations for other services: None  Discharge Criteria: Patient will be discharged from PT if patient refuses treatment 3 consecutive times without medical reason, if treatment goals not met, if there is a change in medical status, if patient makes no progress towards goals or if patient is discharged from hospital.  The above assessment, treatment plan, treatment alternatives and goals were discussed and mutually agreed upon: by patient  Chipper Herb. Brantleigh Mifflin, PT, DPT 03/15/2013, 10:13 AM

## 2013-03-15 NOTE — Progress Notes (Signed)
Occupational Therapy Session Note  Patient Details  Name: Jason Cross MRN: [id number] Date of Birth: 09/03/1928  Today's Date: 03/15/2013 Time: 1300-1330 Time Calculation (min): 30 min  Short Term Goals: Week 1:  OT Short Term Goal 1 (Week 1): STG= LTG  Skilled Therapeutic Interventions/Progress Updates:  Pt was in bed upon arrival and wife was present during therapy session.  Pt demonstrated knowledge of back precautions reciting 2/3, and was educated on the third.  Pt practiced safe bed mobility roll to sit on EOB and donned back brace with min A. Pt functionally ambulated with RW to nurses station before resting in wc.   Pt was educated on safe shower transfers and practiced shower transfer with RW in therapy apartment demonstrating newly learned techniques.   Pt ambulated with RW back to hospital room and stood to use urinal at the EOB with stead A.  Pt had no c/o pain.  Focus on: education and safety techniques, functional mobility, increased activity tolerance and endurance.    Therapy Documentation Precautions:  Precautions Precautions: Back Precaution Booklet Issued: No Precaution Comments: pt verbalizes 2/3 back precautions Required Braces or Orthoses: Spinal Brace Spinal Brace: Lumbar corset;Applied in sitting position Restrictions Weight Bearing Restrictions: No    Pain: Pain Assessment Pain Assessment: No/denies pain Pain Score: 0-No pain  See FIM for current functional status  Therapy/Group: Individual Therapy  Shipley,Sheila 03/15/2013, 2:46 PM  Note reviewed and accurately reflects treatment session.

## 2013-03-15 NOTE — Progress Notes (Signed)
Physical Therapy Session Note  Patient Details  Name: Jason Cross MRN: [id number] Date of Birth: 1928-12-20  Today's Date: 03/15/2013 Time: 3846-6599 Time Calculation (min): 40 min  Short Term Goals: Week 1:  PT Short Term Goal 1 (Week 1): STGs=LTGs secondary to LOS  Skilled Therapeutic Interventions/Progress Updates:    Patient received supine in bed, wife present to observe therapy session. Session focused on functional transfers, gait training, and LE strengthening. Gait training 130' x2 with RW and close supervision. NuStep Level 5 with B LEs only x8'. Initiated [place] exercises with B UE support: standing heel raises, hip abduction 2x10 each with close supervision. Patient requires mod cues for proper hand placement during transfers. Patient returned to room and left supine in bed with all needs within reach and wife present.  Therapy Documentation Precautions:  Precautions Precautions: Back Precaution Booklet Issued: No Required Braces or Orthoses: Spinal Brace Spinal Brace: Lumbar corset;Applied in sitting position Restrictions Weight Bearing Restrictions: No Pain: Pain Assessment Pain Assessment: No/denies pain Pain Score: 0-No pain  See FIM for current functional status  Therapy/Group: Individual Therapy  Lillia Abed. Williemae Muriel, PT, DPT 03/15/2013, 2:27 PM

## 2013-03-15 NOTE — Progress Notes (Signed)
Patient information reviewed and entered into eRehab system by [employer], RN, CRRN, PPS Coordinator.  Information including medical coding and functional independence measure will be reviewed and updated through discharge.     Per nursing patient was given "Data Collection Information Summary for Patients in Inpatient Rehabilitation Facilities with attached "Privacy Act Statement-Health Care Records" upon admission.  

## 2013-03-15 NOTE — Progress Notes (Signed)
Pt. Refused cpap. RT informed pt. To notify if he changes his mind. 

## 2013-03-15 NOTE — Progress Notes (Signed)
      Subjective/Complaints: Constipated this am. Slept fairly well. Thighs are still somewhat sensitive. No cough, sob A 12 point review of systems has been performed and if not noted above is otherwise negative.   Objective: Vital Signs: Blood pressure 120/75, pulse 90, temperature 99.4 F (37.4 C), temperature source Oral, resp. rate 18, height 6' 0.05" (1.83 m), weight 81.9 kg (180 lb 8.9 oz), SpO2 93.00%. No results found.  Recent Labs  03/15/13 0547  WBC 10.9*  HGB 10.8*  HCT 32.3*  PLT 211    Recent Labs  03/15/13 0547  NA 138  K 4.3  CL 102  GLUCOSE 94  BUN 34*  CREATININE 1.50*  CALCIUM 8.6   CBG (last 3)  No results found for this basename: GLUCAP,  in the last 72 hours  Wt Readings from Last 3 Encounters:  03/14/13 81.9 kg (180 lb 8.9 oz)  03/10/13 82.5 kg (181 lb 14.1 oz)  03/10/13 82.5 kg (181 lb 14.1 oz)    Physical Exam:  Constitutional: He is oriented to person, place, and time. He appears well-developed and well-nourished.  HENT:  Head: Normocephalic and atraumatic.  Right Ear: External ear normal.  Left Ear: External ear normal.  Eyes: Conjunctivae and EOM are normal. Pupils are equal, round, and reactive to light.  Neck: Normal range of motion. Neck supple.  Cardiovascular: Normal rate, regular rhythm and normal heart sounds.  Respiratory: Effort normal and breath sounds normal.  GI: Soft. Bowel sounds are normal. He exhibits no distension. There is no tenderness. There is no rebound and no guarding.  Musculoskeletal:  Back incision remains CDI. Honey comb dressing still in place. Has some tenderness along anterior thighs and right knee Neurological: He is alert and oriented to person, place, and time. He has normal reflexes. No cranial nerve deficit. He exhibits normal muscle tone.  5/5 bilateral deltoid, bicep, tricep, grip 4/5 bilateral hip flexor knee extensor and, ankle dorsiflexion and plantarflexion.  Sensation 1+ RLE, 2- LLE. Right  thigh/knee area somewhat sensitive to touch.  Skin: Skin is warm and dry. Prior sternotomy scar noted.  Psychiatric: He has a normal mood and affect. His behavior is normal   Assessment/Plan: 1. Functional deficits secondary to thoraco-lumbar stenosis with myelopathy which require 3+ hours per day of interdisciplinary therapy in a comprehensive inpatient rehab setting. Physiatrist is providing close team supervision and 24 hour management of active medical problems listed below. Physiatrist and rehab team continue to assess barriers to discharge/monitor patient progress toward functional and medical goals. FIM:                                 Medical Problem List and Plan:  1. DVT Prophylaxis/Anticoagulation: Pharmaceutical: Lovenox  2. Pain Management: On neurontin tid. Continue prn vicodin. Monitor for confusion/titrate medications to effect.  3. Mood: Provide ego support to patient and family. LCSW to follow for evaluation and support.  4. Neuropsych: This patient is capable of making decisions on his own behalf.  5. HTN: Monitor with bid checks. DC HCTZ 12.5mg  6. Urinary retention: dc foley.  ua neg, cx pending  7. GERD: Continue protonix.  8. Constipation: augment bowel regimen  9. FEN: dc hctz, add low dose HS IVF, push po   LOS (Days) 1 A FACE TO FACE EVALUATION WAS PERFORMED  SWARTZ,ZACHARY T 03/15/2013 7:50 AM

## 2013-03-16 ENCOUNTER — Encounter (HOSPITAL_COMMUNITY): Payer: Medicare Other

## 2013-03-16 ENCOUNTER — Inpatient Hospital Stay (HOSPITAL_COMMUNITY): Payer: Medicare Other | Admitting: Occupational Therapy

## 2013-03-16 ENCOUNTER — Inpatient Hospital Stay (HOSPITAL_COMMUNITY): Payer: Medicare Other | Admitting: *Deleted

## 2013-03-16 LAB — URINE CULTURE
Colony Count: NO GROWTH
Culture: NO GROWTH

## 2013-03-16 LAB — BASIC METABOLIC PANEL
BUN: 34 mg/dL — ABNORMAL HIGH (ref 6–23)
CO2: 25 mEq/L (ref 19–32)
Calcium: 8.5 mg/dL (ref 8.4–10.5)
Chloride: 100 mEq/L (ref 96–112)
Creatinine, Ser: 1.38 mg/dL — ABNORMAL HIGH (ref 0.50–1.35)
GFR, EST AFRICAN AMERICAN: 53 mL/min — AB (ref >90)
GFR, EST NON AFRICAN AMERICAN: 46 mL/min — AB (ref >90)
Glucose, Bld: 95 mg/dL (ref 70–99)
Potassium: 5.5 mEq/L — ABNORMAL HIGH (ref 3.7–5.3)
Sodium: 137 mEq/L (ref 137–147)

## 2013-03-16 NOTE — Plan of Care (Signed)
Problem: RH BLADDER ELIMINATION Goal: RH STG MANAGE BLADDER WITH EQUIPMENT WITH ASSISTANCE STG Manage Bladder With Equipment With Assistance. Total A  Outcome: Progressing Total A. Requiring I&O cath as pt is not completely emptying

## 2013-03-16 NOTE — Progress Notes (Signed)
Subjective/Complaints:  moved bowels yesterday. Slept well A 12 point review of systems has been performed and if not noted above is otherwise negative.   Objective: Vital Signs: Blood pressure 95/55, pulse 83, temperature 98.2 F (36.8 C), temperature source Oral, resp. rate 18, height 6' 0.05" (1.83 m), weight 81.9 kg (180 lb 8.9 oz), SpO2 93.00%. No results found.  Recent Labs  03/15/13 0547  WBC 10.9*  HGB 10.8*  HCT 32.3*  PLT 211    Recent Labs  03/15/13 0547 03/16/13 0611  NA 138 137  K 4.3 5.5*  CL 102 100  GLUCOSE 94 95  BUN 34* 34*  CREATININE 1.50* 1.38*  CALCIUM 8.6 8.5   CBG (last 3)  No results found for this basename: GLUCAP,  in the last 72 hours  Wt Readings from Last 3 Encounters:  03/14/13 81.9 kg (180 lb 8.9 oz)  03/10/13 82.5 kg (181 lb 14.1 oz)  03/10/13 82.5 kg (181 lb 14.1 oz)    Physical Exam:  Constitutional: He is oriented to person, place, and time. He appears well-developed and well-nourished.  HENT:  Head: Normocephalic and atraumatic.  Right Ear: External ear normal.  Left Ear: External ear normal.  Eyes: Conjunctivae and EOM are normal. Pupils are equal, round, and reactive to light.  Neck: Normal range of motion. Neck supple.  Cardiovascular: Normal rate, regular rhythm and normal heart sounds.  Respiratory: Effort normal and breath sounds normal.  GI: Soft. Bowel sounds are normal. He exhibits no distension. There is no tenderness. There is no rebound and no guarding.  Musculoskeletal:  Back incision remains CDI. Honey comb dressing still in place. persistent tenderness along anterior thighs and right knee Neurological: He is alert and oriented to person, place, and time. He has normal reflexes. No cranial nerve deficit. He exhibits normal muscle tone.  5/5 bilateral deltoid, bicep, tricep, grip 4/5 bilateral hip flexor knee extensor and, ankle dorsiflexion and plantarflexion.  Sensation 1+ RLE, 2- LLE. Right  thigh/knee area somewhat sensitive to touch.  Skin: Skin is warm and dry. Prior sternotomy scar noted.  Psychiatric: He has a normal mood and affect. His behavior is normal   Assessment/Plan: 1. Functional deficits secondary to thoraco-lumbar stenosis with myelopathy which require 3+ hours per day of interdisciplinary therapy in a comprehensive inpatient rehab setting. Physiatrist is providing close team supervision and 24 hour management of active medical problems listed below. Physiatrist and rehab team continue to assess barriers to discharge/monitor patient progress toward functional and medical goals. FIM: FIM - Bathing Bathing Steps Patient Completed: Chest;Right Arm;Left Arm;Abdomen;Front perineal area;Buttocks;Right upper leg;Left upper leg Bathing: 4: Min-Patient completes 8-9 26f 10 parts or 75+ percent  FIM - Upper Body Dressing/Undressing Upper body dressing/undressing steps patient completed: Thread/unthread right sleeve of pullover shirt/dresss;Thread/unthread left sleeve of pullover shirt/dress;Put head through opening of pull over shirt/dress;Pull shirt over trunk Upper body dressing/undressing: 5: Set-up assist to: Obtain clothing/put away FIM - Lower Body Dressing/Undressing Lower body dressing/undressing: 1: Total-Patient completed less than 25% of tasks  FIM - Toileting Toileting steps completed by patient: Performs perineal hygiene Toileting Assistive Devices: Grab bar or rail for support Toileting: 2: Max-Patient completed 1 of 3 steps  FIM - Air cabin crew Transfers: 3-To toilet/BSC: Mod A (lift or lower assist);3-From toilet/BSC: Mod A (lift or lower assist)  FIM - Bed/Chair Transfer Bed/Chair Transfer Assistive Devices: Bed rails;Walker Bed/Chair Transfer: 5: Supine > Sit: Supervision (verbal cues/safety issues);5: Sit > Supine: Supervision (verbal cues/safety  issues);4: Bed > Chair or W/C: Min A (steadying Pt. > 75%);4: Chair or W/C > Bed: Min A  (steadying Pt. > 75%)  FIM - Locomotion: Wheelchair Distance: 40 Locomotion: Wheelchair: 0: Activity did not occur FIM - Locomotion: Ambulation Locomotion: Ambulation Assistive Devices: Administrator Ambulation/Gait Assistance: 5: Supervision Locomotion: Ambulation: 2: Travels 50 - 149 ft with supervision/safety issues  Comprehension Comprehension Mode: Auditory Comprehension: 6-Follows complex conversation/direction: With extra time/assistive device  Expression Expression Mode: Verbal Expression: 5-Expresses basic needs/ideas: With extra time/assistive device  Social Interaction Social Interaction: 5-Interacts appropriately 90% of the time - Needs monitoring or encouragement for participation or interaction.  Problem Solving Problem Solving: 5-Solves basic 90% of the time/requires cueing < 10% of the time  Memory Memory: 5-Recognizes or recalls 90% of the time/requires cueing < 10% of the time Medical Problem List and Plan:  1. DVT Prophylaxis/Anticoagulation: Pharmaceutical: Lovenox  2. Pain Management: On neurontin tid. Continue prn vicodin. titrate medications to effect.  3. Mood: Provide ego support to patient and family. LCSW to follow for evaluation and support.  4. Neuropsych: This patient is capable of making decisions on his own behalf.  5. HTN: Monitor with bid checks. DC HCTZ 12.5mg  6. Urinary retention: dc foley.  ua neg, cx pending  7. GERD: Continue protonix.  8. Constipation: augmented bowel regimen with results 9. FEN: off hctz, continue HS IVF. Recheck labs Saturday. Push po   LOS (Days) 2 A FACE TO FACE EVALUATION WAS PERFORMED  SWARTZ,ZACHARY T 03/16/2013 7:58 AM

## 2013-03-16 NOTE — Progress Notes (Signed)
Occupational Therapy Session Note  Patient Details  Name: Jason Cross MRN: [id number] Date of Birth: [birth year]  Today's Date: 03/16/2013 Time: 0923-3007 Time Calculation (min): 35 min  Short Term Goals: Week 1:  OT Short Term Goal 1 (Week 1): STG= LTG  Skilled Therapeutic Interventions/Progress Updates:    1:1 Focus on functional ambulation with RW, standing balance dynamically without UE support (stepping onto block, toe tapping one block and top of cones, sit to stand with and without UE support with ball inbetween LEs, sidestepping without UE support, bed mobility supine<>sit, practice donning brace. Pt and wife with questions regarding brace and precautions. Provided education and further discussion about showering with back precautions and appropriate DME.   Therapy Documentation Precautions:  Precautions Precautions: Back Precaution Booklet Issued: No Precaution Comments: pt verbalizes 3/3 back precautions today 03/16/13 Required Braces or Orthoses: Spinal Brace Spinal Brace: Lumbar corset;Applied in sitting position Restrictions Weight Bearing Restrictions: No Pain: Pain Assessment Pain Assessment: No/denies pain  See FIM for current functional status  Therapy/Group: Individual Therapy  Willeen Cass [facility] 03/16/2013, 1:41 PM

## 2013-03-16 NOTE — Progress Notes (Signed)
Occupational Therapy Session Note  Patient Details  Name: Jason Cross MRN: [id number] Date of Birth: 06-Mar-1928  Today's Date: 03/16/2013 Time: 1000-1055 Time Calculation (min): 55 min  Short Term Goals: Week 1:  OT Short Term Goal 1 (Week 1): STG= LTG  Skilled Therapeutic Interventions/Progress Updates:    Pt engaged in bathing at shower level and dressing with sit<>stand from EOB.  Pt recalled 3/3 back precautions however pt attempted to stand in shower without donning corset requiring max verbal cues.  Pt used long handle sponge to assist with LB bathing but was able to don pants and socks without use of AE.  Pt completed all tasks with supervision with min verbal cues for safety awareness.  Focus on activity tolerance, safety awareness, dynamic standing balance, and functional amb with RW.  Therapy Documentation Precautions:  Precautions Precautions: Back Precaution Booklet Issued: No Precaution Comments: pt verbalizes 3/3 back precautions today 03/16/13 Required Braces or Orthoses: Spinal Brace Spinal Brace: Lumbar corset;Applied in sitting position Restrictions Weight Bearing Restrictions: No   Pain: Pain Assessment Pain Assessment: No/denies pain Pain Score: 0-No pain  See FIM for current functional status  Therapy/Group: Individual Therapy  Leroy Libman 03/16/2013, 11:00 AM

## 2013-03-16 NOTE — Progress Notes (Signed)
Physical Therapy Session Note  Patient Details  Name: Jason Cross MRN: [id number] Date of Birth: 1928-09-20  Today's Date: 03/16/2013 Time:0905-1000 and 1330-1410 Time Calculation (min): 55 min and 40 min  Short Term Goals: Week 1:  PT Short Term Goal 1 (Week 1): STGs=LTGs secondary to LOS  Skilled Therapeutic Interventions/Progress Updates:    AM Session: Patient received supine in bed. Session focused on functional transfers, gait training, stair negotiation, and LE strengthening. Patient able to verbalize 3/3 back precautions and don spinal brace in sitting independently. Patient performed gait training 150' x1, 175'x1, and 205' x1 in controlled and community-like environment of busy hospital hallways with RW and supervision. Patient also performed gait training 30'x1 in ADL apartment (on carpet) to simulate home environment with RW and supervision. Patient performed sit<>supine on regular flat bed with mod I and demonstrating proper log roll technique. Patient performed furniture transfers from low cushioned couches with minA. Patient reports need to use bathroom, returned to room and performed all aspects of toileting with supervision, continent of bowel, RN made aware. Patient performed x10 sit<>stand transfers from recliner with supervision. Seated LAQs with 3" hold x10 each LE. Patient left seated in recliner with all needs within reach.  PM Session: Patient received sitting on mat in gym, wife present. Discussion about discharge planning, recommendations for supervision on stair negotiation and set up assistance for car transfer; patient and wife verbalized understanding. Discussion about HHPT vs. OPPT, patient and wife preferring HHPT. NuStep Level 3 B LEs only x8', no rest breaks. Gait training 160' x1 with RW and supervision. Supervision for dynamic standing balance at sink while patient removes, cleans, and replaces dentures. Patient left supine in bed with all needs within reach and  wife present.  Therapy Documentation Precautions:  Precautions Precautions: Back Precaution Booklet Issued: No Precaution Comments: pt verbalizes 3/3 back precautions today 03/16/13 Required Braces or Orthoses: Spinal Brace Spinal Brace: Lumbar corset;Applied in sitting position Restrictions Weight Bearing Restrictions: No Pain: Pain Assessment Pain Assessment: No/denies pain Pain Score: 0-No pain Locomotion : Ambulation Ambulation/Gait Assistance: 5: Supervision   See FIM for current functional status  Therapy/Group: Individual Therapy  Lillia Abed. Kari Kerth, PT, DPT 03/16/2013, 2:17 PM

## 2013-03-16 NOTE — Plan of Care (Signed)
Problem: RH SKIN INTEGRITY Goal: RH STG ABLE TO PERFORM INCISION/WOUND CARE W/ASSISTANCE STG Able To Perform Incision/Wound Care With minimal Assistance.  Outcome: Not Progressing Incision to lumbar with honeycomb dressing. CDI. However, pt would require assistance with daily inspections to ensure incision is healing appropriately.

## 2013-03-16 NOTE — Progress Notes (Signed)
Recreational Therapy Session Note  Patient Details  Name: Jason Cross MRN: [id number] Date of Birth: 09-Dec-1928 Today's Date: 03/16/2013  Order received & chart reviewed.  Informed by team that pt with short LOS, therefore formal TR eval deferred. Hartley Urton 03/16/2013, 8:43 AM

## 2013-03-16 NOTE — Plan of Care (Signed)
Problem: RH BOWEL ELIMINATION Goal: RH STG MANAGE BOWEL W/MEDICATION W/ASSISTANCE STG Manage Bowel with Medication with minimal Assistance.  Outcome: Progressing BM today

## 2013-03-17 ENCOUNTER — Encounter (HOSPITAL_COMMUNITY): Payer: Medicare Other | Admitting: Occupational Therapy

## 2013-03-17 ENCOUNTER — Inpatient Hospital Stay (HOSPITAL_COMMUNITY): Payer: Medicare Other | Admitting: Occupational Therapy

## 2013-03-17 ENCOUNTER — Inpatient Hospital Stay (HOSPITAL_COMMUNITY): Payer: Medicare Other | Admitting: *Deleted

## 2013-03-17 LAB — BASIC METABOLIC PANEL
BUN: 35 mg/dL — ABNORMAL HIGH (ref 6–23)
CALCIUM: 8.7 mg/dL (ref 8.4–10.5)
CO2: 25 mEq/L (ref 19–32)
Chloride: 100 mEq/L (ref 96–112)
Creatinine, Ser: 1.76 mg/dL — ABNORMAL HIGH (ref 0.50–1.35)
GFR calc Af Amer: 40 mL/min — ABNORMAL LOW (ref >90)
GFR, EST NON AFRICAN AMERICAN: 34 mL/min — AB (ref >90)
Glucose, Bld: 109 mg/dL — ABNORMAL HIGH (ref 70–99)
Potassium: 3.9 mEq/L (ref 3.7–5.3)
SODIUM: 139 meq/L (ref 137–147)

## 2013-03-17 MED ORDER — TAMSULOSIN HCL 0.4 MG PO CAPS: 0.4000 mg | ORAL_CAPSULE | Freq: Every day | ORAL | Status: DC

## 2013-03-17 MED ORDER — HYDROCODONE-ACETAMINOPHEN 5-325 MG PO TABS: 1.0000 | ORAL_TABLET | ORAL | Status: DC | PRN

## 2013-03-17 MED ORDER — GABAPENTIN 100 MG PO CAPS: 200.0000 mg | ORAL_CAPSULE | Freq: Two times a day (BID) | ORAL | Status: DC

## 2013-03-17 MED ORDER — SENNOSIDES-DOCUSATE SODIUM 8.6-50 MG PO TABS: 2.0000 | ORAL_TABLET | Freq: Every day | ORAL | Status: DC

## 2013-03-17 MED ORDER — TAMSULOSIN HCL 0.4 MG PO CAPS
0.4000 mg | ORAL_CAPSULE | Freq: Every day | ORAL | Status: DC
  Administered 2013-03-17 – 2013-03-18 (×2): 0.4 mg via ORAL
  Filled 2013-03-17 (×3): qty 1

## 2013-03-17 MED ORDER — METHOCARBAMOL 500 MG PO TABS: 500.0000 mg | ORAL_TABLET | Freq: Four times a day (QID) | ORAL | Status: DC | PRN

## 2013-03-17 MED ORDER — GABAPENTIN 300 MG PO CAPS: 300.0000 mg | ORAL_CAPSULE | Freq: Two times a day (BID) | ORAL | Status: DC

## 2013-03-17 NOTE — Progress Notes (Signed)
Occupational Therapy Discharge Summary  Patient Details  Name: Jason Cross MRN: [id number] Date of Birth: 08/24/1928  Today's Date: 03/17/2013 Time:  -   14:00-14:40  1:1 Wife present for session. Focused on d/c planning and education on home setup, shower stall transfers, proper use of DME, safety with RW, functional mobility, wearing brace schedule, bed mobility, standing balance, community reintegration discussion and energy conservation.   Patient has met 5 of 5 long term goals due to improved activity tolerance, improved balance, postural control, ability to compensate for deficits and improved coordination.  Patient to discharge at overall Modified Independent level.  Patient's care partner is independent to provide the necessary physical assistance at discharge.    Reasons goals not met: n/a  Recommendation:  Patient will benefit from ongoing skilled OT services in home health setting to continue to advance functional skills in the area of BADL and iADL.  Equipment: RW  Reasons for discharge: treatment goals met and discharge from hospital  Patient/family agrees with progress made and goals achieved: Yes  OT Discharge Precautions/Restrictions  Precautions Precautions: Back Precaution Comments: pt verbalizes 3/3 back precautions today 03/16/13 Required Braces or Orthoses: Spinal Brace Spinal Brace: Lumbar corset;Applied in sitting position Restrictions Weight Bearing Restrictions: No General   Vital Signs Therapy Vitals Temp: 98 F (36.7 C) Temp src: Oral Pulse Rate: 95 Resp: 17 BP: 109/67 mmHg Patient Position, if appropriate: Lying Oxygen Therapy SpO2: 98 % O2 Device: None (Room air) Pain Pain Assessment Pain Assessment: No/denies pain Pain Score: 0-No pain ADL ADL ADL Comments: See FIM score Vision/Perception  Vision - History Baseline Vision: Wears glasses only for reading Visual History: Cataracts Patient Visual Report: No change from  baseline Vision - Assessment Eye Alignment: Within Functional Limits Perception Perception: Within Functional Limits Praxis Praxis: Intact  Cognition Overall Cognitive Status: Within Functional Limits for tasks assessed Arousal/Alertness: Awake/alert Orientation Level: Oriented X4 Memory: Appears intact Safety/Judgment: Appears intact Sensation Sensation Light Touch: Appears Intact Proprioception: Appears Intact Coordination Gross Motor Movements are Fluid and Coordinated: Yes Fine Motor Movements are Fluid and Coordinated: Yes Motor  Motor Motor: Within Functional Limits Motor - Discharge Observations: improved overal strength and activity tolerance Mobility  Transfers Sit to Stand: 6: Modified independent (Device/Increase time) Stand to Sit: 6: Modified independent (Device/Increase time)  Trunk/Postural Assessment  Cervical Assessment Cervical Assessment: Within Functional Limits Thoracic Assessment Thoracic Assessment: Within Functional Limits Lumbar Assessment Lumbar Assessment: Within Functional Limits Postural Control Postural Control: Within Functional Limits  Balance Balance Balance Assessed: Yes Static Sitting Balance Static Sitting - Level of Assistance: 6: Modified independent (Device/Increase time) Static Standing Balance Static Standing - Level of Assistance: 6: Modified independent (Device/Increase time) Dynamic Standing Balance Dynamic Standing - Level of Assistance: 5: Stand by assistance Extremity/Trunk Assessment RUE Assessment RUE Assessment: Within Functional Limits LUE Assessment LUE Assessment: Within Functional Limits  See FIM for current functional status  Willeen Cass [facility] 03/17/2013, 8:27 AM

## 2013-03-17 NOTE — Discharge Summary (Signed)
Physician Discharge Summary  Patient ID: Wise Fees MRN: [id number] DOB/AGE: 1928/08/05 80 y.o.  Admit date: 03/14/2013 Discharge date: 03/18/13  Discharge Diagnoses:  Active Problems:   Lumbar spondylolysis   Urinary retention with incomplete bladder emptying   Renal failure, acute on chronic   Unspecified constipation   Discharged Condition: stable  Significant Diagnostic Studies: N/A  Labs:  Basic Metabolic Panel:  Recent Labs Lab 03/15/13 0547 03/16/13 0611 03/17/13 0930  NA 138 137 139  K 4.3 5.5* 3.9  CL 102 100 100  CO2 23 25 25   GLUCOSE 94 95 109*  BUN 34* 34* 35*  CREATININE 1.50* 1.38* 1.76*  CALCIUM 8.6 8.5 8.7    CBC:  Recent Labs Lab 03/15/13 0547  WBC 10.9*  NEUTROABS 6.9  HGB 10.8*  HCT 32.3*  MCV 89.0  PLT 211    CBG: No results found for this basename: GLUCAP,  in the last 168 hours  Brief HPI:   Jason Cross is a 80 y.o. male with history of HTN, peripheral neuropathy, severe back pain radiating to BLE. Patient with spondylosis and cord compression at T12-L1 and severe spinal stenosis and marked facet hypertrophy at the L3-L5 levels. He elected to undergo redo lumbar decompressive fusion L4-5, and T12-L1 laminectomy with L3-L5 posterolateral arthrodesis on 03/10/13 by Dr. Vertell Limber. Post op with improvement in pain management but with urinary retention requiring foley placement. Has had some confusion due to narcotics therefore dilaudid d/c. He was admitted to [facility] for progressive therapies.    Hospital Course: Ralpheal Bress was admitted to rehab 03/14/2013 for inpatient therapies to consist of PT, ST and OT at least three hours five days a week. Past admission physiatrist, therapy team and rehab RN have worked together to provide customized collaborative inpatient rehab. Back incision has been monitored and is healing well without signs or symptoms of infections. Admission labs showed acute on chronic renal failure with BUN/Cr- 34/1.5.  HCTZ was discontinued and he was treated with HS hydration without improvement. He was started on bowel program to help with constipation and foley was discontinued past admission. Bladder program was initiated with scheduled toileting as well as PVR checks. Patient was noted to have problems with retention requiring tid-qid catheterization for PVRs 400-600cc.   Nephrology was consulted  input and felt that renal failure was related to retention issues and recommended foley placement for obstructive uropathy as well as follow up with urology on outpatient basis. Patient has history of retention in the past and urology consult was placed at discharge. Medication changes as well as issues regarding urinary retention and CKD were discussed with patient and wife with recommendations to follow up. He has made good progress in rehab and is modified independent with use of rolling walker. He will continue to receive HHPT and HHRN by  [facility] past discharge.    Rehab course: During patient's stay in rehab weekly team conferences were held to monitor patient's progress, set goals and discuss barriers to discharge. Patient has had improvement in activity tolerance, balance, postural control, as well as ability to compensate for deficits. He's modified independent to ADL tasks. He is modified independent for transfers and is able to ambulate 175 feet with RW. He requires supervision to navigate stairs. Family education was done with wife who will assist as needed past discharge.   Diet: Regular  Special Instructions: 1. No bending, twisting, arching. Do not lift anything over 5 lbs.  2. NO driving. 3. Follow up with urology for  bladder issues. 4. Discuss current medication changes with PMD.         Future Appointments Provider Department Dept Phone   04/26/2013 12:40 PM Meredith Staggers, MD [facility] [phone]       Medication List    STOP taking these  medications       hydrochlorothiazide 12.5 MG capsule  Commonly known as:  MICROZIDE      TAKE these medications       aspirin 81 MG tablet  Take 81 mg by mouth daily.     gabapentin 300 MG capsule  Commonly known as:  NEURONTIN  Take 1 capsule (300 mg total) by mouth 2 (two) times daily.     HYDROcodone-acetaminophen 5-325 MG per tablet  Commonly known as:  NORCO/VICODIN  Take 1 tablet by mouth every 4 (four) hours as needed for moderate pain.     methocarbamol 500 MG tablet  Commonly known as:  ROBAXIN  Take 1 tablet (500 mg total) by mouth every 6 (six) hours as needed for muscle spasms.     omeprazole 20 MG tablet  Commonly known as:  PRILOSEC OTC  Take 10 mg by mouth every other day.     senna-docusate 8.6-50 MG per tablet  Commonly known as:  Senokot-S  Take 2 tablets by mouth at bedtime. For constipation     simvastatin 40 MG tablet  Commonly known as:  ZOCOR  Take 40 mg by mouth daily.     tamsulosin 0.4 MG Caps capsule  Commonly known as:  FLOMAX  Take 1 capsule (0.4 mg total) by mouth daily.       Follow-up Information   Follow up with Meredith Staggers, MD On 04/26/2013. (Be there at 12:10   for  12:40 appointment )    Specialty:  Physical Medicine and Rehabilitation   Contact information:   [address], [place] [address] [phone]       Follow up with Peggyann Shoals, MD. Call today. (for post op appointment)    Specialty:  Neurosurgery   Contact information:   [address] [place] [address] [phone]       Follow up with Golden Pop, MD On 03/24/2013. (@ 10:45 am with Kathrine Haddock, NP)    Specialty:  [facility]   Contact information:   [place] [address] [phone]       Follow up with Edrick Oh, MD On 03/17/2013. (Be there at    )    Specialty:  Unknown Physician Specialty   Contact information:   [place] [address]       Signed: Bary Leriche 03/20/2013, 5:33 PM

## 2013-03-17 NOTE — IPOC Note (Signed)
Overall Plan of Care [facility]) Patient Details Name: Yona Stansbury MRN: [id number] DOB: 1928-04-06  Admitting Diagnosis: Lumbar fusion  Hospital Problems: Active Problems:   Lumbar spondylolysis     Functional Problem List: Nursing Bladder;Bowel;Edema;Endurance;Medication Management;Motor;Pain;Safety;Skin Integrity  PT Balance;Endurance;Motor;Pain;Safety;Sensory  OT Balance;Endurance;Pain;Safety  SLP    TR         Basic ADL's: OT Grooming;Bathing;Dressing;Toileting     Advanced  ADL's: OT       Transfers: PT Bed Mobility;Bed to Chair;Car;Furniture  OT Toilet;Tub/Shower     Locomotion: PT Ambulation;Wheelchair Mobility;Stairs     Additional Impairments: OT    SLP        TR      Anticipated Outcomes Item Anticipated Outcome  Self Feeding independent  Swallowing      Basic self-care  setup  Toileting  setup   Bathroom Transfers setup  Bowel/Bladder  manage bowel with mod I; manage bladder with independence  Transfers  mod I  Locomotion  mod I household ambulation, supervision stairs and community ambulation  Communication     Cognition     Pain  pain at or below level 3  Safety/Judgment  n/a   Therapy Plan: PT Intensity: Minimum of 1-2 x/day ,45 to 90 minutes PT Frequency: 5 out of 7 days PT Duration Estimated Length of Stay: 5-7 days OT Intensity: Minimum of 1-2 x/day, 45 to 90 minutes OT Frequency: 5 out of 7 days OT Duration/Estimated Length of Stay: 7-9 days         Team Interventions: Nursing Interventions Patient/Family Education;Skin Care/Wound Management;Bladder Management;Bowel Management;Disease Management/Prevention;Discharge Planning;Pain Management;Medication Management  PT interventions Ambulation/gait training;Balance/vestibular training;Cognitive remediation/compensation;Community reintegration;Discharge planning;Neuromuscular re-education;Functional mobility training;DME/adaptive equipment instruction;Pain  management;Patient/family education;Psychosocial support;Skin care/wound management;Splinting/orthotics;UE/LE Coordination activities;UE/LE Strength taining/ROM;Therapeutic Exercise;Therapeutic Activities;Wheelchair propulsion/positioning;Stair training  OT Interventions Balance/vestibular training;Community reintegration;Discharge planning;DME/adaptive equipment instruction;Functional mobility training;Neuromuscular re-education;Pain management;Patient/family education;Self Care/advanced ADL retraining;Therapeutic Activities;Therapeutic Exercise;Wheelchair propulsion/positioning  SLP Interventions    TR Interventions    SW/CM Interventions Discharge Planning;Psychosocial Support;Patient/Family Education    Team Discharge Planning: Destination: PT-Home ,OT- Home , SLP-  Projected Follow-up: PT-Home health PT, OT-  Home health OT, SLP-  Projected Equipment Needs: PT-Rolling walker with 5" wheels;To be determined, OT- Wheelchair cushion (measurements);Wheelchair (measurements);To be determined, SLP-  Equipment Details: PT-Patient owns [facility] and standard walker. Further DME recommendations TBD uipon discharge, OT-  Patient/family involved in discharge planning: PT- Patient,  OT-Patient, SLP-   MD ELOS: 5 days Medical Rehab Prognosis:  Excellent Assessment: The patient has been admitted for CIR therapies. The team will be addressing, functional mobility, strength, stamina, balance, safety, adaptive techniques/equipment, self-care, bowel and bladder mgt (neurogenic), patient and caregiver education, pain mgt, NMR, . Goals have been set at Duane Lope, MD, [facility]      See Team Conference Notes for weekly updates to the plan of care

## 2013-03-17 NOTE — Consult Note (Signed)
[facility] CONSULT NOTE    Date: 03/17/2013                  Patient Name:  Jason Cross  MRN: [id number]  DOB: 04-14-1928  Age / Sex: 80 y.o., male         PCP: Golden Pop, MD                 Service Requesting Consult: PM&R                 Reason for Consult: CKD            History of Present Illness: Patient is a 80 y.o. male with a PMHx of HTN, HLD, CAD s/p CABG, peripheral neuropathy and severe chronic back pain who was admitted to [facility] on 03/14/2013 for surgery (redo lumbar decompressive fusion L4-5, and T12-L1 laminectomy with L3-L5 posterolateral arthrodesis).  Patient's post operative course was complicated by urinary retention requiring foley placement.  Renal consulted today regarding elevated creatinine.  Patient still having voiding issue and is retaining urine.  He, and family member, report that he has seen Nephrology [facility] in 2014) for workup regarding CKD.  He also reports that he has had prior prostate surgery and has had issues with post-op retention in the past.  He reports that he is currently feeling well and is eager to go home.  He reports some difficulty voiding, LE weakness and numbness.  Denies any other problems currently.  Medications: Outpatient medications: Prescriptions prior to admission  Medication Sig Dispense Refill  . aspirin 81 MG tablet Take 81 mg by mouth daily.      Marland Kitchen gabapentin (NEURONTIN) 300 MG capsule Take 300 mg by mouth 3 (three) times daily.      . hydrochlorothiazide (MICROZIDE) 12.5 MG capsule Take 12.5 mg by mouth daily.      Marland Kitchen omeprazole (PRILOSEC OTC) 20 MG tablet Take 10 mg by mouth every other day.      . simvastatin (ZOCOR) 40 MG tablet Take 40 mg by mouth daily.        Current medications: Current Facility-Administered Medications  Medication Dose Route Frequency Provider Last Rate Last Dose  . 0.45 % sodium chloride infusion   Intravenous Continuous Meredith Staggers, MD 75 mL/hr  at 03/16/13 2000    . alum & mag hydroxide-simeth (MAALOX/MYLANTA) 200-200-20 MG/5ML suspension 30 mL  30 mL Oral Q6H PRN Bary Leriche, PA-C      . aspirin EC tablet 81 mg  81 mg Oral Daily Bary Leriche, PA-C   81 mg at 03/17/13 0805  . bisacodyl (DULCOLAX) suppository 10 mg  10 mg Rectal Daily PRN Bary Leriche, PA-C      . diphenhydrAMINE (BENADRYL) 12.5 MG/5ML elixir 12.5-25 mg  12.5-25 mg Oral Q6H PRN Bary Leriche, PA-C      . enoxaparin (LOVENOX) injection 40 mg  40 mg Subcutaneous Q24H Pamela S Love, PA-C   40 mg at 03/16/13 2001  . gabapentin (NEURONTIN) capsule 200 mg  200 mg Oral TID Bary Leriche, PA-C   200 mg at 03/17/13 1349  . guaiFENesin-dextromethorphan (ROBITUSSIN DM) 100-10 MG/5ML syrup 5-10 mL  5-10 mL Oral Q6H PRN Bary Leriche, PA-C      . HYDROcodone-acetaminophen (NORCO/VICODIN) 5-325 MG per tablet 1-2 tablet  1-2 tablet Oral Q4H PRN Bary Leriche, PA-C   1 tablet at 03/17/13 [id number]  . menthol-cetylpyridinium (CEPACOL) lozenge 3 mg  1 lozenge  Oral PRN Ivan Anchors Love, PA-C       Or  . phenol (CHLORASEPTIC) mouth spray 1 spray  1 spray Mouth/Throat PRN Bary Leriche, PA-C      . methocarbamol (ROBAXIN) tablet 500 mg  500 mg Oral Q6H PRN Bary Leriche, PA-C   500 mg at 03/16/13 2000  . pantoprazole (PROTONIX) EC tablet 40 mg  40 mg Oral QODAY Ivan Anchors Love, PA-C   40 mg at 03/16/13 [id number]  . prochlorperazine (COMPAZINE) tablet 5-10 mg  5-10 mg Oral Q6H PRN Bary Leriche, PA-C       Or  . prochlorperazine (COMPAZINE) injection 5-10 mg  5-10 mg Intramuscular Q6H PRN Bary Leriche, PA-C       Or  . prochlorperazine (COMPAZINE) suppository 12.5 mg  12.5 mg Rectal Q6H PRN Bary Leriche, PA-C      . senna-docusate (Senokot-S) tablet 2 tablet  2 tablet Oral QHS Bary Leriche, PA-C   2 tablet at 03/16/13 2001  . simvastatin (ZOCOR) tablet 40 mg  40 mg Oral Daily Bary Leriche, PA-C   40 mg at 03/17/13 0805  . traMADol (ULTRAM) tablet 50 mg  50 mg Oral Q6H PRN Bary Leriche, PA-C   50  mg at 03/16/13 2000    Allergies: Allergies  Allergen Reactions  . Contrast Media [Iodinated Diagnostic Agents] Hives, Itching and Rash  . Codeine Nausea And Vomiting   Past Medical History: Past Medical History  Diagnosis Date  . Hyperlipidemia     takes Simvastatin daily  . Hypertension     takes HCTZ daily  . Coronary artery disease   . Peripheral neuropathy     takes Gabapentin daily  . Myocardial infarction   . COPD (chronic obstructive pulmonary disease)   . Pneumonia     hx of in the 60's  . Dizziness   . Arthritis   . Chronic back pain   . GERD (gastroesophageal reflux disease)     takes Omeprazole every other day  . Gastric ulcer     in the 60's  . History of colon polyps   . History of kidney stones   . Kidney stone     now lodged on left side but so low that it won't move per pt and wife  . Cataracts, bilateral   . Hard of hearing     doesn't wear hearing aids  . Sleep apnea    Past Surgical History: Past Surgical History  Procedure Laterality Date  . Neck surgery  1962  . Back surgery  2007  . Microthermo prostate  2009  . Tonsillectomy      at age 83  . Colonoscopy    . Eye surgery      implants  . Coronary artery bypass graft  2009    LIMA to LAD, SVG to D1 07/2007  . Thoracic discectomy N/A 03/10/2013    Procedure: Thoracic twelve-Lumbar one Laminectomy;  Surgeon: Erline Levine, MD;  Location: [place] [facility];  Service: Neurosurgery;  Laterality: N/A;  Thoracic twelve-Lumbar one Laminectomy   Family History: No family history on file.  Social History: History   Social History  . Marital Status: Married    Spouse Name: N/A    Number of Children: N/A  . Years of Education: N/A   Occupational History  . Not on file.   Social History Main Topics  . Smoking status: Former Research scientist (life sciences)  . Smokeless tobacco: Not on file  Comment: quit smoking 15 yrs ago  . Alcohol Use: No  . Drug Use: No  . Sexual Activity: Not on file   Other Topics Concern   . Not on file   Social History Narrative  . No narrative on file   Review of Systems: Per HPI  Vital Signs: Blood pressure 109/67, pulse 95, temperature 98 F (36.7 C), temperature source Oral, resp. rate 17, height 6' 0.05" (1.83 m), weight 175 lb 1.6 oz (79.425 kg), SpO2 98.00%.  Weight trends: Filed Weights   03/14/13 1900 03/14/13 2100 03/15/13 1500  Weight: 181 lb 14.1 oz (82.5 kg) 180 lb 8.9 oz (81.9 kg) 175 lb 1.6 oz (79.425 kg)   Physical Exam: Exam: General: well appearing elderly gentleman sitting up in chair. NAD.  Cardiovascular: RRR. No murmurs, rubs, or gallops. Respiratory: CTAB. No rales, rhonchi, or wheeze. Abdomen: soft, nontender, nondistended. No palpable organomegaly, although exam limited by back brace. Extremities: No LE edema.  Skin: Bruising noted on upper extremities.  Neuro: No focal deficits.  Lab results: Basic Metabolic Panel:  Recent Labs Lab 03/15/13 0547 03/16/13 0611 03/17/13 0930  NA 138 137 139  K 4.3 5.5* 3.9  CL 102 100 100  CO2 23 25 25   GLUCOSE 94 95 109*  BUN 34* 34* 35*  CREATININE 1.50* 1.38* 1.76*  CALCIUM 8.6 8.5 8.7   Liver Function Tests:  Recent Labs Lab 03/15/13 0547  AST 18  ALT 12  ALKPHOS 82  BILITOT 0.7  PROT 6.0  ALBUMIN 2.9*   CBC:  Recent Labs Lab 03/15/13 0547  WBC 10.9*  NEUTROABS 6.9  HGB 10.8*  HCT 32.3*  MCV 89.0  PLT 211   Microbiology: Results for orders placed during the hospital encounter of 03/14/13  URINE CULTURE     Status: None   Collection Time    03/15/13  5:45 AM      Result Value Range Status   Specimen Description URINE, CATHETERIZED   Final   Special Requests NONE   Final   Culture  Setup Time     Final   Value: 03/15/2013 06:22     Performed at [facility] Count     Final   Value: NO GROWTH     Performed at [facility]   Culture     Final   Value: NO GROWTH     Performed at [facility]   Report Status 03/16/2013 FINAL    Final   Urinalysis:  Recent Labs  03/15/13 0545  COLORURINE YELLOW  LABSPEC 1.016  PHURINE 6.0  Industry NEGATIVE  UROBILINOGEN 0.2  NITRITE NEGATIVE  LEUKOCYTESUR SMALL*    Assessment & Plan: Patient is a 80 y.o. male with a PMHx of HTN, HLD, CAD s/p CABG, peripheral neuropathy and severe chronic back pain who was admitted to [facility] on 03/14/2013 for surgery (redo lumbar decompressive fusion L4-5, and T12-L1 laminectomy with L3-L5 posterolateral arthrodesis).  - Has seen [facility] in the past (2014).  Records obtained:   Renal US (07/2012) - Bilateral renal cysts; No hydronephrosis.   Labs (4/30) - Hb 13.8, K - 4.5, Creatinine 1.45, GFR 45  UA (4/30) - 2+ protein, otherwise unremarkable  1) CKD-III - Patient appears to have CKD-III - Patient does not want further workup at this time. - Patient needs continued follow up with [facility]. Will not do extensive W/U,  as has had. Primary issue to deal with is urinary retention.  2) Urinary retention - This is the primary concern.  Patient continues to retain urine.  - He is in need of intermittent self caths at home or the placement of a foley; either way he needs to follow up closely with urology. Discussed with Primary SVC would rec foley with Urology F/U and Flomax. - Flomax added today.  Patient cleared for D/C from our perspective.   [place] PGY-2 I have seen and examined this patient and agree with the plan of care seen, examined, counseled.  Discussed with patient, family, and primary service. .  Bailea Beed L 03/17/2013, 4:08 PM

## 2013-03-17 NOTE — Progress Notes (Signed)
Occupational Therapy Session Note  Patient Details  Name: Jason Cross MRN: [id number] Date of Birth: 17-Apr-1928  Today's Date: 03/17/2013 Time:  -     Short Term Goals: Week 1:  OT Short Term Goal 1 (Week 1): STG= LTG  Skilled Therapeutic Interventions/Progress Updates:    1:1 self care retraining at shower level with focus on recall back precautions with functional mobility and ADLs. Sat on 3:1 for showering and able to better perform washing of bottom and peri area with opening, activity tolerance, standing tolerance and balance, safety with RW navigating tight spaces. Pt able to demonstrate safe RW mobility with distant supervision. Pt able to void twice successfully in toilet- nursing notified.   Therapy Documentation Precautions:  Precautions Precautions: Back Precaution Booklet Issued: No Precaution Comments: pt verbalizes 3/3 back precautions today 03/16/13 Required Braces or Orthoses: Spinal Brace Spinal Brace: Lumbar corset;Applied in sitting position Restrictions Weight Bearing Restrictions: No Pain: Soreness and requested tylenol just fatigued  See FIM for current functional status  Therapy/Group: Individual Therapy  Willeen Cass [facility] 03/17/2013, 8:23 AM

## 2013-03-17 NOTE — Progress Notes (Signed)
Patient with urinary retention and likely overflowing. I discussed option of in and out cath v/s indwelling foley with urology follow up on outpatient basis. Patient likely with BPH as well as neurogenic bladder due to recent back surgery. UCS 03/15/13 without growth.  Patient feels that he can learn how to cath and would prefer to do this past discharge. Will have nursing teach patient .

## 2013-03-17 NOTE — Progress Notes (Signed)
Social Work Patient ID: Jason Cross, male   DOB: 01-Aug-1928, 80 y.o.   MRN: [id number]  Jason Pall, LCSW Social Worker Signed  Patient Care Conference Service date: 03/17/2013 12:20 PM  Inpatient RehabilitationTeam Conference and Plan of Care Update Date: 03/17/2013   Time: 12:21 PM     Patient Name: Jason Cross       Medical Record Number: [id number]   Date of Birth: [birth year] Sex: Male         Room/Bed: 4M05C/4M05C-01 Payor Info: Payor: MEDICARE / Plan: MEDICARE PART A AND B / Product Type: *No Product type* /   Admitting Diagnosis: Lumbar fusion   Admit Date/Time:  03/14/2013  6:08 PM Admission Comments: No comment available   Primary Diagnosis:  <principal problem not specified> Principal Problem: <principal problem not specified>    Patient Active Problem List     Diagnosis  Date Noted   .  Lumbar spondylolysis  03/14/2013   .  Lumbar scoliosis  03/10/2013     Expected Discharge Date: Expected Discharge Date: 03/18/13  Team Members Present: Physician leading conference: Dr. Alger Simons Social Worker Present: Jason Pall, LCSW Nurse Present: Rozetta Nunnery, RN PT Present: Melene Plan, PT OT Present: Willeen Cass, OT        Current Status/Progress  Goal  Weekly Team Focus   Medical     thoraco-lumbar stenosis with myelopathy s/p decompression. urine retention improving. pain better.   stabilize medical issues for dc  pain mgt, voiding, wound care   Bowel/Bladder     continent of bowel with min assist, in and out catheterization for no void in 6-8 hours  continent of bowel and bladder  teach self catheterization   Swallow/Nutrition/ Hydration            ADL's     supervision  supervision overall  transfers, functional ambulation, recall with mobility of back precautions, activity tolerances, d/c planning   Mobility     S-mod I; will be mod I for d/c  S-mod I  safety, gait, transfers, bed mobility, strengthening, activity tolerance, stair negotiation    Communication            Safety/Cognition/ Behavioral Observations           Pain     pain controlled with prn medications  pain equal to or less than 3 on scale of 0-10  reassess pain q4h and prn and give prn pain med as needed   Skin     skin without s/s of breakdown  maintain skin integrety   teach patient to change positions to relieve pressure points    Rehab Goals Patient on target to meet rehab goals: Yes *See Care Plan and progress notes for long and short-term goals.    Barriers to Discharge:  none     Possible Resolutions to Barriers:    none     Discharge Planning/Teaching Needs:    home with wife to provide 24/7 assistance      Team Discussion:    Pt has made excellent progress and reaching mod i goals.  Urinary retention remain with MD/ PA addressing.  Feel ready for d/c tomorrow.   Revisions to Treatment Plan:    None    Continued Need for Acute Rehabilitation Level of Care: The patient requires daily medical management by a physician with specialized training in physical medicine and rehabilitation for the following conditions: Daily direction of a multidisciplinary physical rehabilitation program to ensure safe treatment while eliciting the highest  outcome that is of practical value to the patient.: Yes Daily medical management of patient stability for increased activity during participation in an intensive rehabilitation regime.: Yes Daily analysis of laboratory values and/or radiology reports with any subsequent need for medication adjustment of medical intervention for : Post surgical problems;Neurological problems  Jason Cross 03/17/2013, 12:21 PM

## 2013-03-17 NOTE — Progress Notes (Signed)
Inpatient Rehabilitation Center Individual Statement of Services  Patient Name:  Jason Cross  Date:  03/17/2013  Welcome to the [facility].  Our goal is to provide you with an individualized program based on your diagnosis and situation, designed to meet your specific needs.  With this comprehensive rehabilitation program, you will be expected to participate in at least 3 hours of rehabilitation therapies Monday-Friday, with modified therapy programming on the weekends.  Your rehabilitation program will include the following services:  Physical Therapy (PT), Occupational Therapy (OT), 24 hour per day rehabilitation nursing, Case Management (Social Worker), Rehabilitation Medicine, Nutrition Services and Pharmacy Services  Weekly team conferences will be held on Tuesdays to discuss your progress.  Your Social Worker will talk with you frequently to get your input and to update you on team discussions.  Team conferences with you and your family in attendance may also be held.  Expected length of stay: 5 days  Overall anticipated outcome: Modified independent  Depending on your progress and recovery, your program may change. Your Social Worker will coordinate services and will keep you informed of any changes. Your Social Worker's name and contact numbers are listed  below.  The following services may also be recommended but are not provided by the [facility] will be made to provide these services after discharge if needed.  Arrangements include referral to agencies that provide these services.  Your insurance has been verified to be:  Medicare and [place] Your primary doctor is:  Dr. Jeananne Rama  Pertinent information will be shared with your doctor and your insurance company.  Social Worker:  [place], [place] or ([phone]    Information discussed with and copy given to patient by: Jason Cross, 03/17/2013, 12:19 PM

## 2013-03-17 NOTE — Progress Notes (Signed)
Physical Therapy Discharge Summary  Patient Details  Name: Jason Cross MRN: [id number] Date of Birth: October 19, 1928  Today's Date: 03/17/2013 Time: 0830-0930 and 1030-1100 Time Calculation (min): 60 min and 30 min  Patient has met 9 of 9 long term goals due to improved activity tolerance, improved balance, improved postural control, increased strength, decreased pain, ability to compensate for deficits and improved coordination.  Patient to discharge at an ambulatory level Modified Independent.   Patient's care partner not necessary secondary to patient discharging at mod I level.  Reasons goals not met: N/A, all LTGs met.  Recommendation:  Patient will benefit from ongoing skilled PT services in home health setting to continue to advance safe functional mobility, address ongoing impairments in strength, balance, activity tolerance, gait, coordination, overall functional mobility, and minimize fall risk.  Equipment: RW  Reasons for discharge: treatment goals met and discharge from hospital  Patient/family agrees with progress made and goals achieved: Yes  Skilled Interventions: Today's sessions focused on all functional mobility in preparation for discharge home; see details below. Patient performed car transfer with supervision/set up assist for RW management. Patient exercised on NuStep Level 3 with B LEs only x10'. Patient required to use bathroom, mod I for all aspects of toileting. Patient made mod I in room. Patient with no further questions at this time.  PT Discharge Precautions/Restrictions Precautions Precautions: Back Precaution Comments: pt verbalizes 3/3 back precautions today 03/16/13 Required Braces or Orthoses: Spinal Brace Spinal Brace: Lumbar corset;Applied in sitting position Restrictions Weight Bearing Restrictions: No Pain Pain Assessment Pain Assessment: No/denies pain Pain Score: 0-No pain Vision/Perception  Vision - History Baseline Vision: Wears  glasses only for reading Visual History: Cataracts Patient Visual Report: No change from baseline Vision - Assessment Eye Alignment: Within Functional Limits Perception Perception: Within Functional Limits Praxis Praxis: Intact  Cognition Overall Cognitive Status: Within Functional Limits for tasks assessed Arousal/Alertness: Awake/alert Orientation Level: Oriented X4 Memory: Appears intact Safety/Judgment: Appears intact Sensation Sensation Light Touch: Appears Intact Proprioception: Appears Intact Coordination Gross Motor Movements are Fluid and Coordinated: Yes Fine Motor Movements are Fluid and Coordinated: Yes Motor  Motor Motor: Within Functional Limits Motor - Discharge Observations: improved overal strength and activity tolerance  Mobility Bed Mobility Bed Mobility: Supine to Sit;Sit to Supine Supine to Sit: HOB flat;6: Modified independent (Device/Increase time) Sit to Supine: 6: Modified independent (Device/Increase time);HOB flat Transfers Transfers: Yes Sit to Stand: 6: Modified independent (Device/Increase time);With armrests;With upper extremity assist;From bed;From chair/3-in-1 Stand to Sit: 6: Modified independent (Device/Increase time);To chair/3-in-1;To bed;With upper extremity assist;With armrests Stand Pivot Transfers: 6: Modified independent (Device/Increase time);With armrests Locomotion  Ambulation Ambulation: Yes Ambulation/Gait Assistance: 6: Modified independent (Device/Increase time) Ambulation Distance (Feet): 175 Feet Assistive device: Rolling walker Ambulation/Gait Assistance Details: Functional ambulation 175' in controlled environment, 46' in ADL apartment (to simulate home environment), and 155' in community environment (crowded hospital hallways with various people, objects, carts, etc.) to negotiate. Gait Gait: Yes Gait Pattern: Impaired Gait Pattern: Step-through pattern;Narrow base of support;Trunk flexed Stairs / Additional  Locomotion Stairs: Yes Stairs Assistance: 5: Supervision Stairs Assistance Details: Verbal cues for precautions/safety Stair Management Technique: One rail Right;Step to pattern;Sideways Number of Stairs: 5 Height of Stairs: 6 Curb: 5: Psychiatric nurse: Yes Wheelchair Assistance: 5: Investment banker, operational Details: Verbal cues for Marketing executive: Both lower extermities Wheelchair Parts Management: Needs assistance Distance: 150  Trunk/Postural Assessment  Cervical Assessment Cervical Assessment: Within Functional Limits Thoracic Assessment Thoracic Assessment: Within Functional Limits Lumbar Assessment  Lumbar Assessment: Within Functional Limits Postural Control Postural Control: Within Functional Limits  Balance Balance Balance Assessed: Yes Static Sitting Balance Static Sitting - Balance Support: No upper extremity supported;Feet supported Static Sitting - Level of Assistance: 6: Modified independent (Device/Increase time) Static Standing Balance Static Standing - Balance Support: Bilateral upper extremity supported;Left upper extremity supported;Right upper extremity supported;During functional activity Static Standing - Level of Assistance: 6: Modified independent (Device/Increase time) Dynamic Standing Balance Dynamic Standing - Balance Support: Bilateral upper extremity supported;During functional activity Dynamic Standing - Level of Assistance: 6: Modified independent (Device/Increase time) Extremity Assessment  RLE Assessment RLE Assessment: Exceptions to [facility] RLE Strength RLE Overall Strength: Deficits RLE Overall Strength Comments: Grossly 3+/5 LLE Assessment LLE Assessment: Exceptions to [facility] LLE Strength LLE Overall Strength: Deficits LLE Overall Strength Comments: Grossly 3+/5  See FIM for current functional status  Wyland Rastetter S Oluwakemi Salsberry S. Ladena Jacquez, PT, DPT 03/17/2013, 10:51 AM

## 2013-03-17 NOTE — Progress Notes (Signed)
Social Work  Social Work Assessment and Plan  Patient Details  Name: Jason Cross MRN: [id number] Date of Birth: 1928/04/01  Today's Date: 03/17/2013  Problem List:  Patient Active Problem List   Diagnosis Date Noted  . Lumbar spondylolysis 03/14/2013  . Lumbar scoliosis 03/10/2013   Past Medical History:  Past Medical History  Diagnosis Date  . Hyperlipidemia     takes Simvastatin daily  . Hypertension     takes HCTZ daily  . Coronary artery disease   . Peripheral neuropathy     takes Gabapentin daily  . Myocardial infarction   . COPD (chronic obstructive pulmonary disease)   . Pneumonia     hx of in the 60's  . Dizziness   . Arthritis   . Chronic back pain   . GERD (gastroesophageal reflux disease)     takes Omeprazole every other day  . Gastric ulcer     in the 60's  . History of colon polyps   . History of kidney stones   . Kidney stone     now lodged on left side but so low that it won't move per pt and wife  . Cataracts, bilateral   . Hard of hearing     doesn't wear hearing aids  . Sleep apnea    Past Surgical History:  Past Surgical History  Procedure Laterality Date  . Neck surgery  1962  . Back surgery  2007  . Microthermo prostate  2009  . Tonsillectomy      at age 80  . Colonoscopy    . Eye surgery      implants  . Coronary artery bypass graft  2009    LIMA to LAD, SVG to D1 07/2007  . Thoracic discectomy N/A 03/10/2013    Procedure: Thoracic twelve-Lumbar one Laminectomy;  Surgeon: Erline Levine, MD;  Location: [place] [facility];  Service: Neurosurgery;  Laterality: N/A;  Thoracic twelve-Lumbar one Laminectomy   Social History:  reports that he has quit smoking. He does not have any smokeless tobacco history on file. He reports that he does not drink alcohol or use illicit drugs.  Family / Support Systems Marital Status: Married Patient Roles: Spouse Spouse/Significant Other: Lindbergh Winkles @ (H) [phone] or [phone] Children: wife with  adult so local and they provide care for his 26 yo child on weekends Anticipated Caregiver: Wife Ability/Limitations of Caregiver: wife/pt have some anxiety issues  Caregiver Availability: 24/7 Family Dynamics: wife very supportive and states willing to provide any assist needed.  Social History Preferred language: English Religion: Non-Denominational Cultural Background: NA Education: college Read: Yes Write: Yes Employment Status: Retired Freight forwarder Issues: none Guardian/Conservator: none, per MD, pt capable of making decisions on his own behalf   Abuse/Neglect Physical Abuse: Denies Verbal Abuse: Denies Sexual Abuse: Denies Exploitation of patient/patient's resources: Denies Self-Neglect: Denies  Emotional Status Pt's affect, behavior adn adjustment status: pt lying in bed and c/ o fatigue from therapies today.  Wife at bedside and supportive.  Pt aware team feels he will be ready for d/c over weekend.  Notes team "keeps telling me I'm doing good but I don't see it."  admits he is frustrated by limitations but denies any significant emotional distress.   Recent Psychosocial Issues: None Pyschiatric History: None Substance Abuse History: None  Patient / Family Perceptions, Expectations & Goals Pt/Family understanding of illness & functional limitations: pt and wife with basic understanding of surgery performed and current functional limitations/ need for CIR.  Premorbid pt/family roles/activities: Pt was independent overall PTA and enjoying retirement.  As noted, he and wife care for their grandchild almost every weekend Anticipated changes in roles/activities/participation: Wife to assume any caregiver support needs and they have asked that others help with grandchild, at least for the next couple of weeks. Pt/family expectations/goals: Pt hopes to be able to "get outside and walk around" as soon as possible  [employer]:  None Premorbid Home Care/DME Agencies: None Transportation available at discharge: yes  Discharge Planning Living Arrangements: Spouse/significant other Support Systems: Spouse/significant other Type of Residence: Private residence Insurance underwriter Resources: Education officer, museum (specify) Web designer) Financial Resources: [place] Referred: No Living Expenses: Own Money Management: Patient Does the patient have any problems obtaining your medications?: No Home Management: pt and wife Patient/Family Preliminary Plans: return home with wife as primary support Social Work Anticipated Follow Up Needs: HH/OP Expected length of stay: 5-7 days  Clinical Impression Pleasant, oriented gentleman here following back surgery. Progressing well and team feels ready for d/c tomorrow and pt agreeable.  No significant emotional distress.  Rylyn Zawistowski 03/17/2013, 12:16 PM

## 2013-03-17 NOTE — Progress Notes (Signed)
Subjective/Complaints: Lying supine in bed trying to empty bladder. A 12 point review of systems has been performed and if not noted above is otherwise negative.   Objective: Vital Signs: Blood pressure 109/67, pulse 95, temperature 98 F (36.7 C), temperature source Oral, resp. rate 17, height 6' 0.05" (1.83 m), weight 79.425 kg (175 lb 1.6 oz), SpO2 98.00%. No results found.  Recent Labs  03/15/13 0547  WBC 10.9*  HGB 10.8*  HCT 32.3*  PLT 211    Recent Labs  03/15/13 0547 03/16/13 0611  NA 138 137  K 4.3 5.5*  CL 102 100  GLUCOSE 94 95  BUN 34* 34*  CREATININE 1.50* 1.38*  CALCIUM 8.6 8.5   CBG (last 3)  No results found for this basename: GLUCAP,  in the last 72 hours  Wt Readings from Last 3 Encounters:  03/15/13 79.425 kg (175 lb 1.6 oz)  03/10/13 82.5 kg (181 lb 14.1 oz)  03/10/13 82.5 kg (181 lb 14.1 oz)    Physical Exam:  Constitutional: He is oriented to person, place, and time. He appears well-developed and well-nourished.  HENT:  Head: Normocephalic and atraumatic.  Right Ear: External ear normal.  Left Ear: External ear normal.  Eyes: Conjunctivae and EOM are normal. Pupils are equal, round, and reactive to light.  Neck: Normal range of motion. Neck supple.  Cardiovascular: Normal rate, regular rhythm and normal heart sounds.  Respiratory: Effort normal and breath sounds normal.  GI: Soft. Bowel sounds are normal. He exhibits no distension. There is no tenderness. There is no rebound and no guarding.  Musculoskeletal:  Back incision remains CDI. Honey comb dressing still in place. persistent tenderness along anterior thighs and right knee Neurological: He is alert and oriented to person, place, and time. He has normal reflexes. No cranial nerve deficit. He exhibits normal muscle tone.  5/5 bilateral deltoid, bicep, tricep, grip 4/5 bilateral hip flexor knee extensor and, ankle dorsiflexion and plantarflexion.  Sensation 1+ RLE, 2- LLE.  Right thigh/knee area somewhat sensitive to touch.  Skin: Skin is warm and dry. Prior sternotomy scar noted.  Psychiatric: He has a normal mood and affect. His behavior is normal   Assessment/Plan: 1. Functional deficits secondary to thoraco-lumbar stenosis with myelopathy which require 3+ hours per day of interdisciplinary therapy in a comprehensive inpatient rehab setting. Physiatrist is providing close team supervision and 24 hour management of active medical problems listed below. Physiatrist and rehab team continue to assess barriers to discharge/monitor patient progress toward functional and medical goals. FIM: FIM - Bathing Bathing Steps Patient Completed: Chest;Right Arm;Left Arm;Abdomen;Front perineal area;Buttocks;Right upper leg;Left upper leg Bathing: 4: Min-Patient completes 8-9 81f 10 parts or 75+ percent  FIM - Upper Body Dressing/Undressing Upper body dressing/undressing steps patient completed: Thread/unthread right sleeve of pullover shirt/dresss;Thread/unthread left sleeve of pullover shirt/dress;Put head through opening of pull over shirt/dress;Pull shirt over trunk Upper body dressing/undressing: 5: Set-up assist to: Obtain clothing/put away FIM - Lower Body Dressing/Undressing Lower body dressing/undressing: 1: Total-Patient completed less than 25% of tasks  FIM - Toileting Toileting steps completed by patient: Performs perineal hygiene Toileting Assistive Devices: Grab bar or rail for support Toileting: 2: Max-Patient completed 1 of 3 steps  FIM - Air cabin crew Transfers: 3-To toilet/BSC: Mod A (lift or lower assist);3-From toilet/BSC: Mod A (lift or lower assist)  FIM - Control and instrumentation engineer Devices: Walker;Arm rests Bed/Chair Transfer: 6: Supine > Sit: No assist;5: Chair or W/C > Bed: Supervision (  verbal cues/safety issues);6: Sit > Supine: No assist;5: Bed > Chair or W/C: Supervision (verbal cues/safety issues)  FIM -  Locomotion: Wheelchair Distance: 40 Locomotion: Wheelchair: 0: Activity did not occur FIM - Locomotion: Ambulation Locomotion: Ambulation Assistive Devices: Administrator Ambulation/Gait Assistance: 5: Supervision Locomotion: Ambulation: 5: Travels 150 ft or more with supervision/safety issues  Comprehension Comprehension Mode: Auditory Comprehension: 6-Follows complex conversation/direction: With extra time/assistive device  Expression Expression Mode: Verbal Expression: 5-Expresses basic 90% of the time/requires cueing < 10% of the time.  Social Interaction Social Interaction: 5-Interacts appropriately 90% of the time - Needs monitoring or encouragement for participation or interaction.  Problem Solving Problem Solving: 5-Solves basic 90% of the time/requires cueing < 10% of the time  Memory Memory: 5-Recognizes or recalls 90% of the time/requires cueing < 10% of the time Medical Problem List and Plan:  1. DVT Prophylaxis/Anticoagulation: Pharmaceutical: Lovenox  2. Pain Management: On neurontin tid. Continue prn vicodin. titrate medications to effect.  3. Mood: Provide ego support to patient and family. LCSW to follow for evaluation and support.  4. Neuropsych: This patient is capable of making decisions on his own behalf.  5. HTN: Monitor with bid checks. off HCTZ 12.5mg  6. Urinary retention: dc foley.  ua neg, cx neg as well  -encouraged oob to void 7. GERD: Continue protonix.  8. Constipation: augmented bowel regimen with results 9. FEN: off hctz, potentially DC IVF. Check labs today   LOS (Days) 3 A FACE TO FACE EVALUATION WAS PERFORMED  Adrain Nesbit T 03/17/2013 7:36 AM

## 2013-03-17 NOTE — Patient Care Conference (Signed)
Inpatient RehabilitationTeam Conference and Plan of Care Update Date: 03/17/2013   Time: 12:21 PM    Patient Name: Jason Cross      Medical Record Number: [id number]  Date of Birth: 1928-08-28 Sex: Male         Room/Bed: 4M05C/4M05C-01 Payor Info: Payor: MEDICARE / Plan: MEDICARE PART A AND B / Product Type: *No Product type* /    Admitting Diagnosis: Lumbar fusion  Admit Date/Time:  03/14/2013  6:08 PM Admission Comments: No comment available   Primary Diagnosis:  <principal problem not specified> Principal Problem: <principal problem not specified>  Patient Active Problem List   Diagnosis Date Noted  . Lumbar spondylolysis 03/14/2013  . Lumbar scoliosis 03/10/2013    Expected Discharge Date: Expected Discharge Date: 03/18/13  Team Members Present: Physician leading conference: Dr. Alger Simons Social Worker Present: Lennart Pall, LCSW Nurse Present: Rozetta Nunnery, RN PT Present: Melene Plan, PT OT Present: Willeen Cass, OT     Current Status/Progress Goal Weekly Team Focus  Medical   thoraco-lumbar stenosis with myelopathy s/p decompression. urine retention improving. pain better.   stabilize medical issues for dc  pain mgt, voiding, wound care   Bowel/Bladder   continent of bowel with min assist, in and out catheterization for no void in 6-8 hours  continent of bowel and bladder  teach self catheterization   Swallow/Nutrition/ Hydration             ADL's   supervision  supervision overall  transfers, functional ambulation, recall with mobility of back precautions, activity tolerances, d/c planning   Mobility   S-mod I; will be mod I for d/c  S-mod I  safety, gait, transfers, bed mobility, strengthening, activity tolerance, stair negotiation   Communication             Safety/Cognition/ Behavioral Observations            Pain   pain controlled with prn medications  pain equal to or less than 3 on scale of 0-10  reassess pain q4h and prn and give prn pain med  as needed   Skin   skin without s/s of breakdown  maintain skin integrety   teach patient to change positions to relieve pressure points    Rehab Goals Patient on target to meet rehab goals: Yes *See Care Plan and progress notes for long and short-term goals.  Barriers to Discharge: none    Possible Resolutions to Barriers:  none    Discharge Planning/Teaching Needs:  home with wife to provide 24/7 assistance      Team Discussion:  Pt has made excellent progress and reaching mod i goals.  Urinary retention remain with MD/ PA addressing.  Feel ready for d/c tomorrow.  Revisions to Treatment Plan:  None   Continued Need for Acute Rehabilitation Level of Care: The patient requires daily medical management by a physician with specialized training in physical medicine and rehabilitation for the following conditions: Daily direction of a multidisciplinary physical rehabilitation program to ensure safe treatment while eliciting the highest outcome that is of practical value to the patient.: Yes Daily medical management of patient stability for increased activity during participation in an intensive rehabilitation regime.: Yes Daily analysis of laboratory values and/or radiology reports with any subsequent need for medication adjustment of medical intervention for : Post surgical problems;Neurological problems  Kysha Muralles 03/17/2013, 12:21 PM

## 2013-03-17 NOTE — Discharge Instructions (Signed)
Inpatient Rehab Discharge Instructions  Jason Cross Discharge date and time:  03/18/13  Activities/Precautions/ Functional Status: Activity: NO driving. 5 lbs lifting restrictions. No bending, twisting or arching.  Diet: regular diet Wound Care: keep wound clean and dry  Functional status:  ___ No restrictions     ___ Walk up steps independently ___ 24/7 supervision/assistance   ___ Walk up steps with assistance _X__ Intermittent supervision/assistance  ___ Bathe/dress independently _X__ Walk with walker    ___ Bathe/dress with assistance ___ Walk Independently    ___ Shower independently ___ Walk with assistance    ___ Shower with assistance _X__ No alcohol     ___ Return to work/school ________   COMMUNITY REFERRALS UPON DISCHARGE:    Home Health:   PT      RN                     Agency: [facility] Phone:  [phone]   Medical Equipment/Items Ordered: rolling walker                                                     Agency/Supplier: [facility] @ [phone]      Special Instructions:  1. Follow up with urology for voiding problems  My questions have been answered and I understand these instructions. I will adhere to these goals and the provided educational materials after my discharge from the hospital.  Patient/Caregiver Signature _______________________________ Date __________  Clinician Signature _______________________________________ Date __________  Please bring this form and your medication list with you to all your follow-up doctor's appointments.

## 2013-03-18 NOTE — Progress Notes (Signed)
Subjective: Interval History: none. and has no complaint.  Objective: Vital signs in last 24 hours: Temp:  [98.2 F (36.8 C)] 98.2 F (36.8 C) (01/23 1700) Pulse Rate:  [82] 82 (01/23 1700) Resp:  [17] 17 (01/23 1700) BP: (122)/(79) 122/79 mmHg (01/23 1700) Weight change:   Intake/Output from previous day: 01/23 0701 - 01/24 0700 In: 1740 [P.O.:840; I.V.:900] Out: 1194 [Urine:1194] Intake/Output this shift:    General appearance: alert and cooperative Resp: clear to auscultation bilaterally and normal percussion bilaterally Cardio: S1, S2 normal and systolic murmur: systolic ejection 2/6, crescendo at 2nd left intercostal space GI: brace limits eval Extremities: extremities normal, atraumatic, no cyanosis or edema  Lab Results: No results found for this basename: WBC, HGB, HCT, PLT,  in the last 72 hours BMET:  Recent Labs  03/16/13 0611 03/17/13 0930  NA 137 139  K 5.5* 3.9  CL 100 100  CO2 25 25  GLUCOSE 95 109*  BUN 34* 35*  CREATININE 1.38* 1.76*  CALCIUM 8.5 8.7   No results found for this basename: PTH,  in the last 72 hours Iron Studies: No results found for this basename: IRON, TIBC, TRANSFERRIN, FERRITIN,  in the last 72 hours  Studies/Results: No results found.  I have reviewed the patient's current medications.  Assessment/Plan: 1 CKD F/U with local Neph 2 Ostructive Uropathy f/u Urology 3 S/p spine op P will s/o for now and see again at your request.    LOS: 4 days   Jason Cross 03/18/2013,8:26 AM

## 2013-03-18 NOTE — Progress Notes (Signed)
Subjective/Complaints: Sitting in chair eating. Foley in place/draining A 12 point review of systems has been performed and if not noted above is otherwise negative.   Objective: Vital Signs: Blood pressure 122/79, pulse 82, temperature 98.2 F (36.8 C), temperature source Oral, resp. rate 17, height 6' 0.05" (1.83 m), weight 79.425 kg (175 lb 1.6 oz), SpO2 98.00%. No results found. No results found for this basename: WBC, HGB, HCT, PLT,  in the last 72 hours  Recent Labs  03/16/13 0611 03/17/13 0930  NA 137 139  K 5.5* 3.9  CL 100 100  GLUCOSE 95 109*  BUN 34* 35*  CREATININE 1.38* 1.76*  CALCIUM 8.5 8.7   CBG (last 3)  No results found for this basename: GLUCAP,  in the last 72 hours  Wt Readings from Last 3 Encounters:  03/15/13 79.425 kg (175 lb 1.6 oz)  03/10/13 82.5 kg (181 lb 14.1 oz)  03/10/13 82.5 kg (181 lb 14.1 oz)    Physical Exam:  Constitutional: He is oriented to person, place, and time. He appears well-developed and well-nourished.  HENT:  Head: Normocephalic and atraumatic.  Right Ear: External ear normal.  Left Ear: External ear normal.  Eyes: Conjunctivae and EOM are normal. Pupils are equal, round, and reactive to light.  Neck: Normal range of motion. Neck supple.  Cardiovascular: Normal rate, regular rhythm and normal heart sounds.  Respiratory: Effort normal and breath sounds normal.  GI: Soft. Bowel sounds are normal. He exhibits no distension. There is no tenderness. There is no rebound and no guarding.  Musculoskeletal:  Back incision remains CDI. Honey comb dressing still in place. persistent tenderness along anterior thighs and right knee Neurological: He is alert and oriented to person, place, and time. He has normal reflexes. No cranial nerve deficit. He exhibits normal muscle tone.  5/5 bilateral deltoid, bicep, tricep, grip 4/5 bilateral hip flexor knee extensor and, ankle dorsiflexion and plantarflexion.  Sensation 1+ RLE, 2-  LLE. Right thigh/knee area somewhat sensitive to touch.  Skin: Skin is warm and dry. Prior sternotomy scar noted.  Psychiatric: He has a normal mood and affect. His behavior is normal   Assessment/Plan: 1. Functional deficits secondary to thoraco-lumbar stenosis with myelopathy which require 3+ hours per day of interdisciplinary therapy in a comprehensive inpatient rehab setting. Physiatrist is providing close team supervision and 24 hour management of active medical problems listed below. Physiatrist and rehab team continue to assess barriers to discharge/monitor patient progress toward functional and medical goals.  Pt for dc today. Has made nice gains. uro and renal follow up at home FIM: FIM - Bathing Bathing Steps Patient Completed: Chest;Right Arm;Left Arm;Abdomen;Front perineal area;Buttocks;Right upper leg;Left upper leg;Right lower leg (including foot);Left lower leg (including foot) Bathing: 5: Supervision: Safety issues/verbal cues  FIM - Upper Body Dressing/Undressing Upper body dressing/undressing steps patient completed: Thread/unthread right sleeve of pullover shirt/dresss;Thread/unthread left sleeve of pullover shirt/dress;Put head through opening of pull over shirt/dress;Pull shirt over trunk Upper body dressing/undressing: 7: Complete Independence: No helper FIM - Lower Body Dressing/Undressing Lower body dressing/undressing steps patient completed: Thread/unthread right underwear leg;Thread/unthread left underwear leg;Pull underwear up/down;Thread/unthread right pants leg;Thread/unthread left pants leg;Pull pants up/down;Don/Doff right sock;Don/Doff left sock;Don/Doff right shoe;Fasten/unfasten pants;Don/Doff left shoe;Fasten/unfasten right shoe;Fasten/unfasten left shoe Lower body dressing/undressing: 6: More than reasonable amount of time  FIM - Toileting Toileting steps completed by patient: Performs perineal hygiene;Adjust clothing prior to toileting;Adjust clothing  after toileting Toileting Assistive Devices: Grab bar or rail for support  Toileting: 6: More than reasonable amount of time  FIM - Radio producer Devices: Bedside commode Toilet Transfers: 6-More than reasonable amt of time  FIM - Control and instrumentation engineer Devices: Arm rests;Walker Bed/Chair Transfer: 6: Sit > Supine: No assist;6: Supine > Sit: No assist;6: Assistive device: no helper;6: More than reasonable amt of time;6: Chair or W/C > Bed: No assist;6: Bed > Chair or W/C: No assist  FIM - Locomotion: Wheelchair Distance: 150 Locomotion: Wheelchair: 5: Travels 150 ft or more: maneuvers on rugs and over door sills with supervision, cueing or coaxing FIM - Locomotion: Ambulation Locomotion: Ambulation Assistive Devices: Administrator Ambulation/Gait Assistance: 6: Modified independent (Device/Increase time) Locomotion: Ambulation: 6: Travels 150 ft or more with assistive device/no helper  Comprehension Comprehension Mode: Auditory Comprehension: 6-Follows complex conversation/direction: With extra time/assistive device  Expression Expression Mode: Verbal Expression: 5-Expresses basic 90% of the time/requires cueing < 10% of the time.  Social Interaction Social Interaction: 5-Interacts appropriately 90% of the time - Needs monitoring or encouragement for participation or interaction.  Problem Solving Problem Solving: 5-Solves basic 90% of the time/requires cueing < 10% of the time  Memory Memory: 5-Recognizes or recalls 90% of the time/requires cueing < 10% of the time Medical Problem List and Plan:  1. DVT Prophylaxis/Anticoagulation: Pharmaceutical: Lovenox  2. Pain Management: On neurontin tid. Continue prn vicodin. titrate medications to effect.  3. Mood: Provide ego support to patient and family. LCSW to follow for evaluation and support.  4. Neuropsych: This patient is capable of making decisions on his own behalf.   5. HTN: Monitor with bid checks. off HCTZ 12.5mg  6. Urinary retention: foley as below.  ua neg, cx neg as well    7. GERD: Continue protonix.  8. Constipation: augmented bowel regimen with results 9. FEN/CKD: off hctz  -appreciate CKA help  -pt has nephrology and urology follow up in [place]  -foley in place for now (pt is ok with this for the time being)  LOS (Days) 4 A FACE TO FACE EVALUATION WAS PERFORMED  Jason Cross T 03/18/2013 8:17 AM

## 2013-03-18 NOTE — Progress Notes (Signed)
Addendum: Discharge instructions provided by Algis Liming, PA

## 2013-03-18 NOTE — Progress Notes (Signed)
Pt discharged home with wife. Discharge instructions provided by Silvestre Mesi, PA. All questions answered. Pt verbalized understanding. Pt discharged with foley. Education previously provided by RN regarding foley catheter care, and use of leg bag, per pt's report. Pt escorted off unit in w/c with personal belonging by Levada Dy, [place].

## 2013-03-20 DIAGNOSIS — R339 Retention of urine, unspecified: Secondary | ICD-10-CM | POA: Diagnosis present

## 2013-03-20 DIAGNOSIS — R269 Unspecified abnormalities of gait and mobility: Secondary | ICD-10-CM | POA: Diagnosis not present

## 2013-03-20 DIAGNOSIS — Z466 Encounter for fitting and adjustment of urinary device: Secondary | ICD-10-CM | POA: Diagnosis not present

## 2013-03-20 DIAGNOSIS — Z4789 Encounter for other orthopedic aftercare: Secondary | ICD-10-CM | POA: Diagnosis not present

## 2013-03-20 DIAGNOSIS — G609 Hereditary and idiopathic neuropathy, unspecified: Secondary | ICD-10-CM | POA: Diagnosis not present

## 2013-03-20 DIAGNOSIS — N179 Acute kidney failure, unspecified: Secondary | ICD-10-CM | POA: Diagnosis present

## 2013-03-20 DIAGNOSIS — Z4801 Encounter for change or removal of surgical wound dressing: Secondary | ICD-10-CM | POA: Diagnosis not present

## 2013-03-20 DIAGNOSIS — N189 Chronic kidney disease, unspecified: Secondary | ICD-10-CM

## 2013-03-20 DIAGNOSIS — J441 Chronic obstructive pulmonary disease with (acute) exacerbation: Secondary | ICD-10-CM | POA: Diagnosis not present

## 2013-03-20 DIAGNOSIS — K5900 Constipation, unspecified: Secondary | ICD-10-CM | POA: Diagnosis present

## 2013-03-20 DIAGNOSIS — K5901 Slow transit constipation: Secondary | ICD-10-CM | POA: Diagnosis present

## 2013-03-21 ENCOUNTER — Telehealth: Payer: Self-pay

## 2013-03-21 NOTE — Telephone Encounter (Signed)
Estill Bamberg with [facility] called to get verbal for home health nursing.

## 2013-03-21 NOTE — Progress Notes (Deleted)
03/14/13 1830 03/14/13 2100 03/15/13 0016  Output (mL)  Urine --  500 mL (foley) --   Stool --  --  --   Unmeasured Output  Urine Occurrence --  --  --   Stool Occurrence --  --  --   Urine Characteristics  Urinary Incontinence No --  No  Urine Color --  --  --   Urine Appearance --  --  --   Urine Odor --  --  --   Urinary Interventions --  --  --   Bladder Scan Volume (mL) --  --  --   Intermittent/Straight Cath (mL) --  --  --   Post Void Cath Residual (mL) --  --  --      03/15/13 1515 03/15/13 1705 03/15/13 2100  Output (mL)  Urine --  10 mL 2 mL  Stool 1 mL --  1 mL  Unmeasured Output  Urine Occurrence --  1 2  Stool Occurrence --  --  1  Urine Characteristics  Urinary Incontinence --  No --   Urine Color --  Yellow/straw --   Urine Appearance --  Clear --   Urine Odor --  No odor --   Urinary Interventions --  --  --   Bladder Scan Volume (mL) --  --  --   Intermittent/Straight Cath (mL) --  --  --   Post Void Cath Residual (mL) --  --  --      03/15/13 2232 03/16/13 0014 03/16/13 0050  Output (mL)  Urine 200 mL 10 mL --   Stool --  --  --   Unmeasured Output  Urine Occurrence --  --  --   Stool Occurrence --  1 --   Urine Characteristics  Urinary Incontinence --  --  No  Urine Color --  --  Yellow/straw  Urine Appearance --  --  Clear  Urine Odor --  --  No odor  Urinary Interventions --  Bladder scan --   Bladder Scan Volume (mL) --  637 mL --   Intermittent/Straight Cath (mL) --  --  --   Post Void Cath Residual (mL) --  --  600 mL (In and out Coude catheter)     03/16/13 0800 03/16/13 0900 03/16/13 1826  Output (mL)  Urine 50 mL --  --   Stool --  --  --   Unmeasured Output  Urine Occurrence --  --  --   Stool Occurrence --  1 --   Urine Characteristics  Urinary Incontinence --  No --   Urine Color --  --  Amber  Urine Appearance --  --  --   Urine Odor --  --  No odor  Urinary Interventions --  --  --   Bladder Scan Volume (mL) 457 mL 382  mL --   Intermittent/Straight Cath (mL) --  400 mL 275 mL  Post Void Cath Residual (mL) --  --  --

## 2013-03-21 NOTE — Progress Notes (Signed)
Social Work  Discharge Note  The overall goal for the admission was met for:   Discharge location: Yes - home with wife able to provide 24/7 assist  Length of Stay: Yes - 4 days  Discharge activity level: Yes - modified independent  Home/community participation: Yes  Services provided included: MD, RD, PT, OT, RN, TR, Pharmacy and [place]: Medicare and Private Insurance: [place]  Follow-up services arranged: Home Health: Therapist, sports, PT via [place], DME: rolling walker via [place] and Patient/Family has no preference for HH/DME agencies  Comments (or additional information):  Patient/Family verbalized understanding of follow-up arrangements: Yes  Individual responsible for coordination of the follow-up plan: patient  Confirmed correct DME delivered: Misheel Gowans 03/21/2013    Ahmani Prehn

## 2013-03-22 DIAGNOSIS — Z4801 Encounter for change or removal of surgical wound dressing: Secondary | ICD-10-CM | POA: Diagnosis not present

## 2013-03-22 DIAGNOSIS — R269 Unspecified abnormalities of gait and mobility: Secondary | ICD-10-CM | POA: Diagnosis not present

## 2013-03-22 DIAGNOSIS — G609 Hereditary and idiopathic neuropathy, unspecified: Secondary | ICD-10-CM | POA: Diagnosis not present

## 2013-03-22 DIAGNOSIS — J441 Chronic obstructive pulmonary disease with (acute) exacerbation: Secondary | ICD-10-CM | POA: Diagnosis not present

## 2013-03-22 DIAGNOSIS — Z4789 Encounter for other orthopedic aftercare: Secondary | ICD-10-CM | POA: Diagnosis not present

## 2013-03-22 DIAGNOSIS — Z466 Encounter for fitting and adjustment of urinary device: Secondary | ICD-10-CM | POA: Diagnosis not present

## 2013-03-22 NOTE — Telephone Encounter (Signed)
Spoke with Estill Bamberg and verbal given to continue home health.

## 2013-03-24 DIAGNOSIS — J441 Chronic obstructive pulmonary disease with (acute) exacerbation: Secondary | ICD-10-CM | POA: Diagnosis not present

## 2013-03-24 DIAGNOSIS — N390 Urinary tract infection, site not specified: Secondary | ICD-10-CM | POA: Diagnosis not present

## 2013-03-24 DIAGNOSIS — Z4682 Encounter for fitting and adjustment of non-vascular catheter: Secondary | ICD-10-CM | POA: Diagnosis not present

## 2013-03-24 DIAGNOSIS — Z4801 Encounter for change or removal of surgical wound dressing: Secondary | ICD-10-CM | POA: Diagnosis not present

## 2013-03-24 DIAGNOSIS — Z4789 Encounter for other orthopedic aftercare: Secondary | ICD-10-CM | POA: Diagnosis not present

## 2013-03-24 DIAGNOSIS — G609 Hereditary and idiopathic neuropathy, unspecified: Secondary | ICD-10-CM | POA: Diagnosis not present

## 2013-03-24 DIAGNOSIS — Z466 Encounter for fitting and adjustment of urinary device: Secondary | ICD-10-CM | POA: Diagnosis not present

## 2013-03-24 DIAGNOSIS — R269 Unspecified abnormalities of gait and mobility: Secondary | ICD-10-CM | POA: Diagnosis not present

## 2013-03-26 ENCOUNTER — Encounter (HOSPITAL_COMMUNITY): Payer: Self-pay | Admitting: Emergency Medicine

## 2013-03-26 ENCOUNTER — Emergency Department (HOSPITAL_COMMUNITY)
Admission: EM | Admit: 2013-03-26 | Discharge: 2013-03-26 | Disposition: A | Discharge status: Home / Self Care | Payer: Medicare Other | Attending: Emergency Medicine | Admitting: Emergency Medicine

## 2013-03-26 DIAGNOSIS — J4489 Other specified chronic obstructive pulmonary disease: Secondary | ICD-10-CM | POA: Insufficient documentation

## 2013-03-26 DIAGNOSIS — I10 Essential (primary) hypertension: Secondary | ICD-10-CM | POA: Insufficient documentation

## 2013-03-26 DIAGNOSIS — M129 Arthropathy, unspecified: Secondary | ICD-10-CM | POA: Diagnosis not present

## 2013-03-26 DIAGNOSIS — R109 Unspecified abdominal pain: Secondary | ICD-10-CM | POA: Diagnosis not present

## 2013-03-26 DIAGNOSIS — R5381 Other malaise: Secondary | ICD-10-CM | POA: Insufficient documentation

## 2013-03-26 DIAGNOSIS — Z8701 Personal history of pneumonia (recurrent): Secondary | ICD-10-CM | POA: Insufficient documentation

## 2013-03-26 DIAGNOSIS — Z8711 Personal history of peptic ulcer disease: Secondary | ICD-10-CM | POA: Diagnosis not present

## 2013-03-26 DIAGNOSIS — E785 Hyperlipidemia, unspecified: Secondary | ICD-10-CM | POA: Insufficient documentation

## 2013-03-26 DIAGNOSIS — R5383 Other fatigue: Secondary | ICD-10-CM | POA: Insufficient documentation

## 2013-03-26 DIAGNOSIS — K219 Gastro-esophageal reflux disease without esophagitis: Secondary | ICD-10-CM | POA: Insufficient documentation

## 2013-03-26 DIAGNOSIS — Z7982 Long term (current) use of aspirin: Secondary | ICD-10-CM | POA: Diagnosis not present

## 2013-03-26 DIAGNOSIS — Z87442 Personal history of urinary calculi: Secondary | ICD-10-CM | POA: Diagnosis not present

## 2013-03-26 DIAGNOSIS — J449 Chronic obstructive pulmonary disease, unspecified: Secondary | ICD-10-CM | POA: Diagnosis not present

## 2013-03-26 DIAGNOSIS — Z792 Long term (current) use of antibiotics: Secondary | ICD-10-CM | POA: Insufficient documentation

## 2013-03-26 DIAGNOSIS — M549 Dorsalgia, unspecified: Secondary | ICD-10-CM | POA: Diagnosis not present

## 2013-03-26 DIAGNOSIS — I252 Old myocardial infarction: Secondary | ICD-10-CM | POA: Insufficient documentation

## 2013-03-26 DIAGNOSIS — I2510 Atherosclerotic heart disease of native coronary artery without angina pectoris: Secondary | ICD-10-CM | POA: Insufficient documentation

## 2013-03-26 DIAGNOSIS — G609 Hereditary and idiopathic neuropathy, unspecified: Secondary | ICD-10-CM | POA: Insufficient documentation

## 2013-03-26 DIAGNOSIS — Z79899 Other long term (current) drug therapy: Secondary | ICD-10-CM | POA: Insufficient documentation

## 2013-03-26 DIAGNOSIS — N138 Other obstructive and reflux uropathy: Secondary | ICD-10-CM | POA: Diagnosis not present

## 2013-03-26 DIAGNOSIS — Z9889 Other specified postprocedural states: Secondary | ICD-10-CM | POA: Diagnosis not present

## 2013-03-26 DIAGNOSIS — M7989 Other specified soft tissue disorders: Secondary | ICD-10-CM | POA: Diagnosis not present

## 2013-03-26 DIAGNOSIS — Z951 Presence of aortocoronary bypass graft: Secondary | ICD-10-CM | POA: Insufficient documentation

## 2013-03-26 DIAGNOSIS — Z860100 Personal history of colon polyps, unspecified: Secondary | ICD-10-CM | POA: Insufficient documentation

## 2013-03-26 DIAGNOSIS — R209 Unspecified disturbances of skin sensation: Secondary | ICD-10-CM | POA: Diagnosis not present

## 2013-03-26 DIAGNOSIS — G8929 Other chronic pain: Secondary | ICD-10-CM | POA: Diagnosis not present

## 2013-03-26 DIAGNOSIS — N390 Urinary tract infection, site not specified: Secondary | ICD-10-CM | POA: Insufficient documentation

## 2013-03-26 DIAGNOSIS — R339 Retention of urine, unspecified: Secondary | ICD-10-CM | POA: Diagnosis not present

## 2013-03-26 DIAGNOSIS — Z87891 Personal history of nicotine dependence: Secondary | ICD-10-CM | POA: Diagnosis not present

## 2013-03-26 DIAGNOSIS — N318 Other neuromuscular dysfunction of bladder: Secondary | ICD-10-CM | POA: Diagnosis not present

## 2013-03-26 DIAGNOSIS — N401 Enlarged prostate with lower urinary tract symptoms: Secondary | ICD-10-CM | POA: Diagnosis not present

## 2013-03-26 LAB — URINALYSIS, ROUTINE W REFLEX MICROSCOPIC
BILIRUBIN URINE: NEGATIVE
GLUCOSE, UA: NEGATIVE mg/dL
KETONES UR: NEGATIVE mg/dL
LEUKOCYTES UA: NEGATIVE
NITRITE: NEGATIVE
PH: 5 (ref 5.0–8.0)
Protein, ur: NEGATIVE mg/dL
SPECIFIC GRAVITY, URINE: 1.023 (ref 1.005–1.030)
Urobilinogen, UA: 0.2 mg/dL (ref 0.0–1.0)

## 2013-03-26 LAB — URINE MICROSCOPIC-ADD ON

## 2013-03-26 MED ORDER — LIDOCAINE HCL 2 % EX GEL
Freq: Once | CUTANEOUS | Status: DC
  Filled 2013-03-26: qty 20

## 2013-03-26 NOTE — ED Notes (Signed)
Unable to pass regular foley and coude cath. Dr Reather Converse aware.

## 2013-03-26 NOTE — ED Notes (Signed)
Patient discharged to home with family. NAD.  

## 2013-03-26 NOTE — Discharge Instructions (Signed)
Follow-up with urology  Return to the emergency department if you develop any changing/worsening condition, severe abdominal pain, repeated vomiting, loss of bowel/bladder function, weakness, loss of sensation, blood in your vomit/stool/urine, or any other concerns (please read additional information regarding your condition below)   Acute Urinary Retention Acute urinary retention is when you are unable to pee (urinate). Acute urinary retention is common in older men. Prostates can get bigger, which blocks the flow of pee.  HOME CARE  Drink enough fluids to keep your pee clear or pale yellow.  If you are sent home with a tube that drains the bladder (catheter), there will be a drainage bag attached to it. There are two types of bags. One is big that you can wear at night without having to empty it. One is smaller and needs to be emptied more often.  Keep the drainage bag empty.  Keep the drainage bag lower than your catheter.  Only take medicine as told by your doctor. GET HELP IF:  You have a low-grade fever.  You have spasms or you are leaking pee when you have spasms. GET HELP RIGHT AWAY IF:   You have chills or a fever.  Your catheter stops draining pee.  Your catheter falls out.  You have increased bleeding that does not stop after you have rested and increased the amount of fluids you had been drinking. MAKE SURE YOU:   Understand these instructions.  Will watch your condition.  Will get help right away if you are not doing well or get worse. Document Released: 07/29/2007 Document Revised: 11/30/2012 Document Reviewed: 07/21/2012 [facility] Patient Information 2014 [place], [place].  Foley Catheter Care, Adult A Foley catheter is a soft, flexible tube that is placed into the bladder to drain urine. A Foley catheter may be inserted if:  You leak urine or are not able to control when you urinate (urinary incontinence).  You are not able to urinate when you need to  (urinary retention).  You had prostate surgery or surgery on the genitals.  You have certain medical conditions, such as multiple sclerosis, dementia, or a spinal cord injury. If you are going home with a Foley catheter in place, follow the instructions below. TAKING CARE OF THE CATHETER 1. Wash your hands with soap and water. 2. Using mild soap and warm water on a clean washcloth:  Clean the area on your body closest to the catheter insertion site using a circular motion, moving away from the catheter. Never wipe toward the catheter because this could sweep bacteria up into the urethra and cause infection.  Remove all traces of soap. Pat the area dry with a clean towel. For males, reposition the foreskin. 3. Attach the catheter to your leg so there is no tension on the catheter. Use adhesive tape or a leg strap. If you are using adhesive tape, remove any sticky residue left behind by the previous tape you used. 4. Keep the drainage bag below the level of the bladder, but keep it off the floor. 5. Check throughout the day to be sure the catheter is working and urine is draining freely. Make sure the tubing does not become kinked. 6. Do not pull on the catheter or try to remove it. Pulling could damage internal tissues. TAKING CARE OF THE DRAINAGE BAGS You will be given two drainage bags to take home. One is a large overnight drainage bag, and the other is a smaller leg bag that fits underneath clothing. You may wear  the overnight bag at any time, but you should never wear the smaller leg bag at night. Follow the instructions below for how to empty, change, and clean your drainage bags. Emptying the Drainage Bag You must empty your drainage bag when it is    full or at least 2 3 times a day. 1. Wash your hands with soap and water. 2. Keep the drainage bag below your hips, below the level of your bladder. This stops urine from going back into the tubing and into your bladder. 3. Hold the dirty  bag over the toilet or a clean container. 4. Open the pour spout at the bottom of the bag and empty the urine into the toilet or container. Do not let the pour spout touch the toilet, container, or any other surface. Doing so can place bacteria on the bag, which can cause an infection. 5. Clean the pour spout with a gauze pad or cotton ball that has rubbing alcohol on it. 6. Close the pour spout. 7. Attach the bag to your leg with adhesive tape or a leg strap. 8. Wash your hands well. Changing the Drainage Bag Change your drainage bag once a month or sooner if it starts to smell bad or look dirty. Below are steps to follow when changing the drainage bag. 1. Wash your hands with soap and water. 2. Pinch off the rubber catheter so that urine does not spill out. 3. Disconnect the catheter tube from the drainage tube at the connection valve. Do not let the tubes touch any surface. 4. Clean the end of the catheter tube with an alcohol wipe. Use a different alcohol wipe to clean the end of the drainage tube. 5. Connect the catheter tube to the drainage tube of the clean drainage bag. 6. Attach the new bag to the leg with adhesive tape or a leg strap. Avoid attaching the new bag too tightly. 7. Wash your hands well. Cleaning the Drainage Bag 1. Wash your hands with soap and water. 2. Wash the bag in warm, soapy water. 3. Rinse the bag thoroughly with warm water. 4. Fill the bag with a solution of white vinegar and water (1 cup vinegar to 1 qt warm water [.2 L vinegar to 1 L warm water]). Close the bag and soak it for 30 minutes in the solution. 5. Rinse the bag with warm water. 6. Hang the bag to dry with the pour spout open and hanging downward. 7. Store the clean bag (once it is dry) in a clean plastic bag. 8. Wash your hands well. PREVENTING INFECTION  Wash your hands before and after handling your catheter.  Take showers daily and wash the area where the catheter enters your body. Do not  take baths. Replace wet leg straps with dry ones, if this applies.  Do not use powders, sprays, or lotions on the genital area. Only use creams, lotions, or ointments as directed by your caregiver.  For females, wipe from front to back after each bowel movement.  Drink enough fluids to keep your urine clear or pale yellow unless you have a fluid restriction.  Do not let the drainage bag or tubing touch or lie on the floor.  Wear cotton underwear to absorb moisture and to keep your skin drier. SEEK MEDICAL CARE IF:   Your urine is cloudy or smells unusually bad.  Your catheter becomes clogged.  You are not draining urine into the bag or your bladder feels full.  Your catheter starts  to leak. SEEK IMMEDIATE MEDICAL CARE IF:   You have pain, swelling, redness, or pus where the catheter enters the body.  You have pain in the abdomen, legs, lower back, or bladder.  You have a fever.  You see blood fill the catheter, or your urine is pink or red.  You have nausea, vomiting, or chills.  Your catheter gets pulled out. MAKE SURE YOU:   Understand these instructions.  Will watch your condition.  Will get help right away if you are not doing well or get worse. Document Released: 02/09/2005 Document Revised: 06/06/2012 Document Reviewed: 02/01/2012 [facility] Patient Information 2014 [place].

## 2013-03-26 NOTE — Consult Note (Signed)
I've been asked to see this patient by Dr. Mariea Clonts, MD for urinary retention and difficult Foley.  History of present illness: Is an 80 year old male with a history of lower urinary tract symptoms. 2009 he had a micro-procedure done in the office by Dr. Eliberto Ivory and [facility]. His voiding symptoms improved initially but had aggressive the last several years. He has a history of urinary retention following surgeries. He recently had a back surgery and had urinary retention hospital. After the Foley was removed he was laced on clean intermittent catheterization. He continued to fail voiding trials and subsequently a Foley catheter was placed. His Foley was removed on Friday and he has been unable to void since. Emergency department the patient denied any dysuria fevers or chills. He did have suprapubic pain. Bladder scan was performed and 800 cc of urine was recorded. Several attempts at placing a catheter were unsuccessful by the nursing staff. As such urology was consulted for placement of a Foley catheter. Since his most recent operation the patient has been on Flomax, however, he has not been on a prior to this. He is also been on ciprofloxacin as impaired therapy for urinary tract infection. She gets up several times at night, which frequently during the day, does not feel like he empties his bladder well. The patient relates that he often has constipation and his urinary symptoms are worse when he is constipated. Denies any bone pain or back pain. He denies any constitutional symptoms such as weight loss or night sweats. Denies any weakness in his extremities or peripheral edema.  Review of systems: 12 point conference of review of systems was obtained and is negative unless otherwise in the history of present illness.  Patient Active Problem List   Diagnosis Date Noted  . Urinary retention with incomplete bladder emptying 03/20/2013  . Renal failure, acute on chronic 03/20/2013  .  Unspecified constipation 03/20/2013  . Lumbar spondylolysis 03/14/2013  . Lumbar scoliosis 03/10/2013   No current facility-administered medications on file prior to encounter.   Current Outpatient Prescriptions on File Prior to Encounter  Medication Sig Dispense Refill  . aspirin 81 MG tablet Take 81 mg by mouth daily.      Marland Kitchen gabapentin (NEURONTIN) 300 MG capsule Take 1 capsule (300 mg total) by mouth 2 (two) times daily.      Marland Kitchen HYDROcodone-acetaminophen (NORCO/VICODIN) 5-325 MG per tablet Take 1 tablet by mouth every 4 (four) hours as needed for moderate pain.  30 tablet  0  . methocarbamol (ROBAXIN) 500 MG tablet Take 1 tablet (500 mg total) by mouth every 6 (six) hours as needed for muscle spasms.  30 tablet  0  . omeprazole (PRILOSEC OTC) 20 MG tablet Take 10 mg by mouth every other day.      . senna-docusate (SENOKOT-S) 8.6-50 MG per tablet Take 2 tablets by mouth at bedtime. For constipation      . simvastatin (ZOCOR) 40 MG tablet Take 40 mg by mouth daily.      . tamsulosin (FLOMAX) 0.4 MG CAPS capsule Take 1 capsule (0.4 mg total) by mouth daily.  30 capsule  1   Past Medical History  Diagnosis Date  . Hyperlipidemia     takes Simvastatin daily  . Hypertension     takes HCTZ daily  . Coronary artery disease   . Peripheral neuropathy     takes Gabapentin daily  . Myocardial infarction   . COPD (chronic obstructive pulmonary disease)   .  Pneumonia     hx of in the 60's  . Dizziness   . Arthritis   . Chronic back pain   . GERD (gastroesophageal reflux disease)     takes Omeprazole every other day  . Gastric ulcer     in the 60's  . History of colon polyps   . History of kidney stones   . Kidney stone     now lodged on left side but so low that it won't move per pt and wife  . Cataracts, bilateral   . Hard of hearing     doesn't wear hearing aids  . Sleep apnea    History  Substance Use Topics  . Smoking status: Former Research scientist (life sciences)  . Smokeless tobacco: Not on file      Comment: quit smoking 15 yrs ago  . Alcohol Use: No   History reviewed. No pertinent family history.  PE: Filed Vitals:   03/26/13 1115 03/26/13 1130 03/26/13 1145 03/26/13 1200  BP: 126/72 116/57 133/70 133/62  Pulse: 88  89   Temp:      TempSrc:      Resp:      Height:      Weight:      SpO2: 99%  99%    NAD, pleasant and cooperative Traumatic normocephalic head no supraclavicular lymphadenopathy Normal respiratory effort without audible wheezing Heart rate is regular Abdomen is soft with suprapubic tenderness Normal appearing phenotypic male genitalia with orthotopic urethral meatus, there is some sticky or tenderness Symmetric lower extremities, with no pedal edema Neurological grossly intact  No results found for this basename: WBC, HGB, HCT,  in the last 72 hours No results found for this basename: NA, K, CL, CO2, GLUCOSE, BUN, CREATININE, CALCIUM,  in the last 72 hours No results found for this basename: LABPT, INR,  in the last 72 hours No results found for this basename: LABURIN,  in the last 72 hours Results for orders placed during the hospital encounter of 03/14/13  URINE CULTURE     Status: None   Collection Time    03/15/13  5:45 AM      Result Value Range Status   Specimen Description URINE, CATHETERIZED   Final   Special Requests NONE   Final   Culture  Setup Time     Final   Value: 03/15/2013 06:22     Performed at [facility]     Final   Value: NO GROWTH     Performed at [facility]   Culture     Final   Value: NO GROWTH     Performed at [facility]   Report Status 03/16/2013 FINAL   Final    Imp: 80 year old male with acute urinary retention following back surgery. Patient has a history of lower urinary tract symptoms likely BPH with bladder outlet obstruction. Recommendation: Foley catheter was easily passed in the emergency department. Recommend the patient continue with Foley catheter to gravity  drainage for one week. Continue Flomax, followup urine culture, and followup with me in the in one week for a voiding trial. I will set up the appointment.

## 2013-03-26 NOTE — ED Provider Notes (Signed)
CSN: [id number]     Arrival date & time 03/26/13  0806 History   First MD Initiated Contact with Patient 03/26/13 0813     Chief Complaint  Patient presents with  . Urinary Retention    HPI  Jason Cross is a 80 y.o. male with a PMH of kidney stones, HLD, HTN, CAD, peripheral neuropathy, MI, COPD, pneumonia, dizziness, arthritis, chronic back pain, GERD, gastric ulcers, colon polyps, and sleep apnea resents to the ED for evaluation of urinary retention.  History was provided by the patient.  Patient had back surgery (redo decompression/fusion, laminectomy, arthrodesis of the lumbar spine) on 03/10/13 with Dr. Vertell Limber.  He was transferred to the IP rehab due to inability to void after surgery.  Multiple attempts were made to remove the catheter however the patient was unable to void.  Patient was discharged with a foley catheter, which was removed on 03/24/13 by his PCP.  He also was dx with a UTI and started on Cipro 500 BID x 10 days.  Patient has had dribbling but no decreased urination.  He describes middle lower pelvic pressure from a "full bladder."  He has had similar presentation in the past with inability to void post-op from previous surgeries.  He has a urologist Dr. Yves Dill [facility] urological) who has performed a surgery for enlarged prostate in the past (2009). He has not seen him since this procedure. He also is currently on Flomax.  He has a follow-up appointment with urology on 03/29/13.  He had a subjective fever three days ago which has resolved.  No emesis, diarrhea, constipation, or loss of bowel/bladder function. He has numbness at baseline which was present prior to his surgery. He also has had generalized weakness "from trying to recover from his surgery" with no worsening weakness. Incision healing well with no drainage.        Past Medical History  Diagnosis Date  . Hyperlipidemia     takes Simvastatin daily  . Hypertension     takes HCTZ daily  . Coronary artery disease   .  Peripheral neuropathy     takes Gabapentin daily  . Myocardial infarction   . COPD (chronic obstructive pulmonary disease)   . Pneumonia     hx of in the 60's  . Dizziness   . Arthritis   . Chronic back pain   . GERD (gastroesophageal reflux disease)     takes Omeprazole every other day  . Gastric ulcer     in the 60's  . History of colon polyps   . History of kidney stones   . Kidney stone     now lodged on left side but so low that it won't move per pt and wife  . Cataracts, bilateral   . Hard of hearing     doesn't wear hearing aids  . Sleep apnea    Past Surgical History  Procedure Laterality Date  . Neck surgery  1962  . Back surgery  2007  . Microthermo prostate  2009  . Tonsillectomy      at age 73  . Colonoscopy    . Eye surgery      implants  . Coronary artery bypass graft  2009    LIMA to LAD, SVG to D1 07/2007  . Thoracic discectomy N/A 03/10/2013    Procedure: Thoracic twelve-Lumbar one Laminectomy;  Surgeon: Erline Levine, MD;  Location: [facility];  Service: Neurosurgery;  Laterality: N/A;  Thoracic twelve-Lumbar one Laminectomy   History  reviewed. No pertinent family history. History  Substance Use Topics  . Smoking status: Former Research scientist (life sciences)  . Smokeless tobacco: Not on file     Comment: quit smoking 15 yrs ago  . Alcohol Use: No    Review of Systems  Constitutional: Positive for fever (subjective - resolved). Negative for chills, diaphoresis, activity change, appetite change and fatigue.  Respiratory: Negative for cough and shortness of breath.   Cardiovascular: Positive for leg swelling. Negative for chest pain.  Gastrointestinal: Positive for abdominal pain. Negative for nausea, vomiting, diarrhea and constipation.  Genitourinary: Positive for difficulty urinating. Negative for dysuria, urgency, frequency, hematuria, flank pain, discharge, penile swelling, scrotal swelling, genital sores, penile pain and testicular pain.  Musculoskeletal: Positive for  back pain (post-op). Negative for gait problem and myalgias.  Skin: Positive for wound (post-op). Negative for color change.  Neurological: Positive for numbness (at baseline). Negative for weakness and headaches.    Allergies  Contrast media and Codeine  Home Medications   Current Outpatient Rx  Name  Route  Sig  Dispense  Refill  . aspirin 81 MG tablet   Oral   Take 81 mg by mouth daily.         . ciprofloxacin (CIPRO) 500 MG tablet   Oral   Take 500 mg by mouth 2 (two) times daily. Take for 7 days. First dose on 03/24/13         . gabapentin (NEURONTIN) 300 MG capsule   Oral   Take 1 capsule (300 mg total) by mouth 2 (two) times daily.         Marland Kitchen HYDROcodone-acetaminophen (NORCO/VICODIN) 5-325 MG per tablet   Oral   Take 1 tablet by mouth every 4 (four) hours as needed for moderate pain.   30 tablet   0   . ibuprofen (ADVIL,MOTRIN) 200 MG tablet   Oral   Take 400 mg by mouth every 6 (six) hours as needed for moderate pain.         . methocarbamol (ROBAXIN) 500 MG tablet   Oral   Take 1 tablet (500 mg total) by mouth every 6 (six) hours as needed for muscle spasms.   30 tablet   0   . omeprazole (PRILOSEC OTC) 20 MG tablet   Oral   Take 10 mg by mouth every other day.         . senna-docusate (SENOKOT-S) 8.6-50 MG per tablet   Oral   Take 2 tablets by mouth at bedtime. For constipation         . simvastatin (ZOCOR) 40 MG tablet   Oral   Take 40 mg by mouth daily.         . tamsulosin (FLOMAX) 0.4 MG CAPS capsule   Oral   Take 1 capsule (0.4 mg total) by mouth daily.   30 capsule   1    BP 118/56  Pulse 97  Temp(Src) 98.6 F (37 C) (Oral)  Resp 20  Ht 6' (1.829 m)  Wt 175 lb (79.379 kg)  BMI 23.73 kg/m2  SpO2 99%  Filed Vitals:   03/26/13 1315 03/26/13 1330 03/26/13 1345 03/26/13 1400  BP: 135/83 150/80 140/69 128/67  Pulse:  88 85 86  Temp:      TempSrc:      Resp:      Height:      Weight:      SpO2:  90% 98% 100%     Physical Exam  Constitutional: He is oriented to  person, place, and time. He appears well-developed and well-nourished. No distress.  HENT:  Head: Normocephalic and atraumatic.  Right Ear: External ear normal.  Left Ear: External ear normal.  Nose: Nose normal.  Mouth/Throat: Oropharynx is clear and moist.  Eyes: Conjunctivae are normal. Right eye exhibits no discharge. Left eye exhibits no discharge.  Neck: Normal range of motion. Neck supple.  Cardiovascular: Normal rate, regular rhythm and normal heart sounds.  Exam reveals no gallop and no friction rub.   No murmur heard. Dorsalis pedis pulses present and equal bilaterally  Pulmonary/Chest: Effort normal and breath sounds normal. No respiratory distress. He has no wheezes. He has no rales. He exhibits no tenderness.  Abdominal: Soft. Bowel sounds are normal. He exhibits no distension and no mass. There is tenderness. There is no rebound and no guarding.  Middle lower pelvic pressure  Musculoskeletal: Normal range of motion. He exhibits edema. He exhibits no tenderness.  Trace leg edema bilaterally.  Strength 5/5 in the lower extremities bilaterally.  Patient able to ambulate with use of walker.   Neurological: He is alert and oriented to person, place, and time.  Gross sensation intact in the LE   Skin: Skin is warm and dry. He is not diaphoretic.  Vertical lumbar spinal incision clean/dry/intact without drainage or surrounding erythema    ED Course  Procedures (including critical care time) Labs Review Labs Reviewed - No data to display Imaging Review No results found.  EKG Interpretation   None      Results for orders placed during the hospital encounter of 03/26/13  URINALYSIS, ROUTINE W REFLEX MICROSCOPIC      Result Value Range   Color, Urine YELLOW  YELLOW   APPearance CLEAR  CLEAR   Specific Gravity, Urine 1.023  1.005 - 1.030   pH 5.0  5.0 - 8.0   Glucose, UA NEGATIVE  NEGATIVE mg/dL   Hgb urine dipstick  TRACE (*) NEGATIVE   Bilirubin Urine NEGATIVE  NEGATIVE   Ketones, ur NEGATIVE  NEGATIVE mg/dL   Protein, ur NEGATIVE  NEGATIVE mg/dL   Urobilinogen, UA 0.2  0.0 - 1.0 mg/dL   Nitrite NEGATIVE  NEGATIVE   Leukocytes, UA NEGATIVE  NEGATIVE  URINE MICROSCOPIC-ADD ON      Result Value Range   RBC / HPF 0-2  <3 RBC/hpf    MDM   Amadeus Rideaux is a 80 y.o. male with a PMH of kidney stones, HLD, HTN, CAD, peripheral neuropathy, MI, COPD, pneumonia, dizziness, arthritis, chronic back pain, GERD, gastric ulcers, colon polyps, and sleep apnea resents to the ED for evaluation of urinary retention.     Rechecks  10:00 AM = RN staff unsuccessful with urinary catheter and coude catheter.  Calling urology.  2:15 PM = Patient states he feels much better after foley catheter inserted. Abdominal pressure relieved.   Consults  10:39 AM = Spoke with Dr. Louis Meckel who will come to the ED to insert the catheter.    2:00 PM = Spoke with Dr. Louis Meckel who successfully inserted the foley catheter.  Will follow-up in clinic. Requesting urine culture. Continue antibiotics.    Patient seen in ED for urinary retention s/p lumbar spinal surgery and middle lower pelvic pressure, which was relieved by insertion of a foley catheter. Patient seen by urology who inserted the catheter due to unsuccessful attempts by RN staff.  Bedside US revealed approximately 700-800 ml of urine. Etiology of urinary retention likely due to prostate enlargement, which the patient has a  hx of in the past. Neurological etiology less likely. Patient will follow-up with urology and continue to take antibiotics for UTI. Patient discharged with foley catheter along with care instructions. Return precautions, discharge instructions, and follow-up was discussed with the patient before discharge.    Discharge Medication List as of 03/26/2013  1:43 PM      Final impressions: 1. Urinary retention      Mercy Moore PA-C   This patient  was discussed with Dr. Shon Hale, PA-C 03/27/13 [phone]

## 2013-03-26 NOTE — ED Notes (Signed)
Patient presents to ED from home with complaints of trouble urinating for a few days. Patient had back surgery on the 16th and had problems after this surgery with urination. Patient family member states he has problems with urination every time he has a surgery.

## 2013-03-26 NOTE — Procedures (Signed)
Pre-procedure diagnosis: difficult foley catheterization Post-procedure diagnosis: as above  Procedure performed: placement of complicated foley  Surgeon: Dr. Ardis Hughs  Findings: Enlarged prostate, approximately 850 cc of clear yellow urine drained  Specimen: Urine was sent for culture  Drains: 12F coude tipped foley  Indications:  Patient unable to void on his own.  Nursing staff have been unsuccessful at placing catheter.  Procedure:  Gentials were prepped and draped in the routine sterile fashion.  10cc of 1% viscous lidocaine jelly was then injected into the patient's urethra.  A 36F coude tipped catheter was then gently passed in to the urethral.  Resistance was met at the prostate, but with gentle pressue the catheter slid through the prostate and into the bladder.  Clear yellow urine was returned.  Patient tolerated the procedure well - no immediate issues.  Disposition: Continue flomax, repeat voiding trial in 7 days.

## 2013-03-27 DIAGNOSIS — Z4789 Encounter for other orthopedic aftercare: Secondary | ICD-10-CM | POA: Diagnosis not present

## 2013-03-27 DIAGNOSIS — G609 Hereditary and idiopathic neuropathy, unspecified: Secondary | ICD-10-CM | POA: Diagnosis not present

## 2013-03-27 DIAGNOSIS — J441 Chronic obstructive pulmonary disease with (acute) exacerbation: Secondary | ICD-10-CM | POA: Diagnosis not present

## 2013-03-27 DIAGNOSIS — Z4801 Encounter for change or removal of surgical wound dressing: Secondary | ICD-10-CM | POA: Diagnosis not present

## 2013-03-27 DIAGNOSIS — Z466 Encounter for fitting and adjustment of urinary device: Secondary | ICD-10-CM | POA: Diagnosis not present

## 2013-03-27 DIAGNOSIS — R269 Unspecified abnormalities of gait and mobility: Secondary | ICD-10-CM | POA: Diagnosis not present

## 2013-03-27 NOTE — ED Provider Notes (Signed)
Medical screening examination/treatment/procedure(s) were conducted as a shared visit with non-physician practitioner(s) or resident and myself. I personally evaluated the patient during the encounter and agree with the findings and plan unless otherwise indicated.  I have personally reviewed any xrays and/ or EKG's with the provider and I agree with interpretation.  Urinary retention since foley removed. Recent back surgery. No fevers. Hx of similar retention/ BPH. Pt has urology and surgery fup. No weakness, pt has numbness prior to surgery. No stool incontinence. 5+ strength with F/E at major LE joints, sensation intact but less medial LE. Well appearing otherwise. Mild fullness suprapubic.  Bedside US showed urinary retention approx 800 ml.  Nursing unable to place foley, urology consulted for placement. Limited Ultrasound of bladder  Performed by Dr. Reather Converse  Indication: to assess for urinary retention and/or bladder volume prior to urinary catheter  Technique: Low frequency probe utilized in two planes to assess bladder volume in real-time.  Findings: Bladder volume 800 cc approx  Images were archived electronically  Fup closely discussed. Foley placed, sxs resolved..  Urinary retention, post op   Mariea Clonts, MD 03/27/13 2002

## 2013-03-28 DIAGNOSIS — Z4789 Encounter for other orthopedic aftercare: Secondary | ICD-10-CM | POA: Diagnosis not present

## 2013-03-28 DIAGNOSIS — Z4801 Encounter for change or removal of surgical wound dressing: Secondary | ICD-10-CM | POA: Diagnosis not present

## 2013-03-28 DIAGNOSIS — G609 Hereditary and idiopathic neuropathy, unspecified: Secondary | ICD-10-CM | POA: Diagnosis not present

## 2013-03-28 DIAGNOSIS — R269 Unspecified abnormalities of gait and mobility: Secondary | ICD-10-CM | POA: Diagnosis not present

## 2013-03-28 DIAGNOSIS — Z466 Encounter for fitting and adjustment of urinary device: Secondary | ICD-10-CM | POA: Diagnosis not present

## 2013-03-28 DIAGNOSIS — J441 Chronic obstructive pulmonary disease with (acute) exacerbation: Secondary | ICD-10-CM | POA: Diagnosis not present

## 2013-03-28 LAB — URINE CULTURE
COLONY COUNT: NO GROWTH
Culture: NO GROWTH

## 2013-03-30 DIAGNOSIS — Z466 Encounter for fitting and adjustment of urinary device: Secondary | ICD-10-CM | POA: Diagnosis not present

## 2013-03-30 DIAGNOSIS — Z4801 Encounter for change or removal of surgical wound dressing: Secondary | ICD-10-CM | POA: Diagnosis not present

## 2013-03-30 DIAGNOSIS — J441 Chronic obstructive pulmonary disease with (acute) exacerbation: Secondary | ICD-10-CM | POA: Diagnosis not present

## 2013-03-30 DIAGNOSIS — R269 Unspecified abnormalities of gait and mobility: Secondary | ICD-10-CM | POA: Diagnosis not present

## 2013-03-30 DIAGNOSIS — Z4789 Encounter for other orthopedic aftercare: Secondary | ICD-10-CM | POA: Diagnosis not present

## 2013-03-30 DIAGNOSIS — G609 Hereditary and idiopathic neuropathy, unspecified: Secondary | ICD-10-CM | POA: Diagnosis not present

## 2013-03-31 DIAGNOSIS — Z466 Encounter for fitting and adjustment of urinary device: Secondary | ICD-10-CM | POA: Diagnosis not present

## 2013-03-31 DIAGNOSIS — Z4801 Encounter for change or removal of surgical wound dressing: Secondary | ICD-10-CM | POA: Diagnosis not present

## 2013-03-31 DIAGNOSIS — G609 Hereditary and idiopathic neuropathy, unspecified: Secondary | ICD-10-CM | POA: Diagnosis not present

## 2013-03-31 DIAGNOSIS — R269 Unspecified abnormalities of gait and mobility: Secondary | ICD-10-CM | POA: Diagnosis not present

## 2013-03-31 DIAGNOSIS — J441 Chronic obstructive pulmonary disease with (acute) exacerbation: Secondary | ICD-10-CM | POA: Diagnosis not present

## 2013-03-31 DIAGNOSIS — Z4789 Encounter for other orthopedic aftercare: Secondary | ICD-10-CM | POA: Diagnosis not present

## 2013-04-03 DIAGNOSIS — M4714 Other spondylosis with myelopathy, thoracic region: Secondary | ICD-10-CM | POA: Diagnosis not present

## 2013-04-03 DIAGNOSIS — M545 Low back pain, unspecified: Secondary | ICD-10-CM | POA: Diagnosis not present

## 2013-04-03 DIAGNOSIS — R339 Retention of urine, unspecified: Secondary | ICD-10-CM | POA: Diagnosis not present

## 2013-04-03 DIAGNOSIS — IMO0002 Reserved for concepts with insufficient information to code with codable children: Secondary | ICD-10-CM | POA: Diagnosis not present

## 2013-04-03 DIAGNOSIS — M4120 Other idiopathic scoliosis, site unspecified: Secondary | ICD-10-CM | POA: Diagnosis not present

## 2013-04-04 DIAGNOSIS — R269 Unspecified abnormalities of gait and mobility: Secondary | ICD-10-CM | POA: Diagnosis not present

## 2013-04-04 DIAGNOSIS — Z466 Encounter for fitting and adjustment of urinary device: Secondary | ICD-10-CM | POA: Diagnosis not present

## 2013-04-04 DIAGNOSIS — G609 Hereditary and idiopathic neuropathy, unspecified: Secondary | ICD-10-CM | POA: Diagnosis not present

## 2013-04-04 DIAGNOSIS — Z4801 Encounter for change or removal of surgical wound dressing: Secondary | ICD-10-CM | POA: Diagnosis not present

## 2013-04-04 DIAGNOSIS — J441 Chronic obstructive pulmonary disease with (acute) exacerbation: Secondary | ICD-10-CM | POA: Diagnosis not present

## 2013-04-04 DIAGNOSIS — Z4789 Encounter for other orthopedic aftercare: Secondary | ICD-10-CM | POA: Diagnosis not present

## 2013-04-06 ENCOUNTER — Other Ambulatory Visit: Payer: Self-pay | Admitting: Urology

## 2013-04-06 DIAGNOSIS — Z4801 Encounter for change or removal of surgical wound dressing: Secondary | ICD-10-CM | POA: Diagnosis not present

## 2013-04-06 DIAGNOSIS — Z4789 Encounter for other orthopedic aftercare: Secondary | ICD-10-CM | POA: Diagnosis not present

## 2013-04-06 DIAGNOSIS — J441 Chronic obstructive pulmonary disease with (acute) exacerbation: Secondary | ICD-10-CM | POA: Diagnosis not present

## 2013-04-06 DIAGNOSIS — G609 Hereditary and idiopathic neuropathy, unspecified: Secondary | ICD-10-CM | POA: Diagnosis not present

## 2013-04-06 DIAGNOSIS — R269 Unspecified abnormalities of gait and mobility: Secondary | ICD-10-CM | POA: Diagnosis not present

## 2013-04-06 DIAGNOSIS — Z466 Encounter for fitting and adjustment of urinary device: Secondary | ICD-10-CM | POA: Diagnosis not present

## 2013-04-10 DIAGNOSIS — Z4789 Encounter for other orthopedic aftercare: Secondary | ICD-10-CM | POA: Diagnosis not present

## 2013-04-10 DIAGNOSIS — Z4801 Encounter for change or removal of surgical wound dressing: Secondary | ICD-10-CM | POA: Diagnosis not present

## 2013-04-10 DIAGNOSIS — R269 Unspecified abnormalities of gait and mobility: Secondary | ICD-10-CM | POA: Diagnosis not present

## 2013-04-10 DIAGNOSIS — Z466 Encounter for fitting and adjustment of urinary device: Secondary | ICD-10-CM | POA: Diagnosis not present

## 2013-04-10 DIAGNOSIS — G609 Hereditary and idiopathic neuropathy, unspecified: Secondary | ICD-10-CM | POA: Diagnosis not present

## 2013-04-10 DIAGNOSIS — J441 Chronic obstructive pulmonary disease with (acute) exacerbation: Secondary | ICD-10-CM | POA: Diagnosis not present

## 2013-04-13 DIAGNOSIS — Z466 Encounter for fitting and adjustment of urinary device: Secondary | ICD-10-CM | POA: Diagnosis not present

## 2013-04-13 DIAGNOSIS — R269 Unspecified abnormalities of gait and mobility: Secondary | ICD-10-CM | POA: Diagnosis not present

## 2013-04-13 DIAGNOSIS — G609 Hereditary and idiopathic neuropathy, unspecified: Secondary | ICD-10-CM | POA: Diagnosis not present

## 2013-04-13 DIAGNOSIS — Z4789 Encounter for other orthopedic aftercare: Secondary | ICD-10-CM | POA: Diagnosis not present

## 2013-04-13 DIAGNOSIS — Z4801 Encounter for change or removal of surgical wound dressing: Secondary | ICD-10-CM | POA: Diagnosis not present

## 2013-04-13 DIAGNOSIS — J441 Chronic obstructive pulmonary disease with (acute) exacerbation: Secondary | ICD-10-CM | POA: Diagnosis not present

## 2013-04-18 DIAGNOSIS — Z4789 Encounter for other orthopedic aftercare: Secondary | ICD-10-CM | POA: Diagnosis not present

## 2013-04-18 DIAGNOSIS — J441 Chronic obstructive pulmonary disease with (acute) exacerbation: Secondary | ICD-10-CM | POA: Diagnosis not present

## 2013-04-18 DIAGNOSIS — G609 Hereditary and idiopathic neuropathy, unspecified: Secondary | ICD-10-CM | POA: Diagnosis not present

## 2013-04-18 DIAGNOSIS — Z4801 Encounter for change or removal of surgical wound dressing: Secondary | ICD-10-CM | POA: Diagnosis not present

## 2013-04-18 DIAGNOSIS — R269 Unspecified abnormalities of gait and mobility: Secondary | ICD-10-CM | POA: Diagnosis not present

## 2013-04-18 DIAGNOSIS — Z466 Encounter for fitting and adjustment of urinary device: Secondary | ICD-10-CM | POA: Diagnosis not present

## 2013-04-19 DIAGNOSIS — R269 Unspecified abnormalities of gait and mobility: Secondary | ICD-10-CM | POA: Diagnosis not present

## 2013-04-19 DIAGNOSIS — G609 Hereditary and idiopathic neuropathy, unspecified: Secondary | ICD-10-CM | POA: Diagnosis not present

## 2013-04-19 DIAGNOSIS — Z4789 Encounter for other orthopedic aftercare: Secondary | ICD-10-CM | POA: Diagnosis not present

## 2013-04-19 DIAGNOSIS — J441 Chronic obstructive pulmonary disease with (acute) exacerbation: Secondary | ICD-10-CM | POA: Diagnosis not present

## 2013-04-19 DIAGNOSIS — Z4801 Encounter for change or removal of surgical wound dressing: Secondary | ICD-10-CM | POA: Diagnosis not present

## 2013-04-19 DIAGNOSIS — Z466 Encounter for fitting and adjustment of urinary device: Secondary | ICD-10-CM | POA: Diagnosis not present

## 2013-04-21 DIAGNOSIS — G609 Hereditary and idiopathic neuropathy, unspecified: Secondary | ICD-10-CM | POA: Diagnosis not present

## 2013-04-21 DIAGNOSIS — R269 Unspecified abnormalities of gait and mobility: Secondary | ICD-10-CM | POA: Diagnosis not present

## 2013-04-21 DIAGNOSIS — Z4801 Encounter for change or removal of surgical wound dressing: Secondary | ICD-10-CM | POA: Diagnosis not present

## 2013-04-21 DIAGNOSIS — Z466 Encounter for fitting and adjustment of urinary device: Secondary | ICD-10-CM | POA: Diagnosis not present

## 2013-04-21 DIAGNOSIS — J441 Chronic obstructive pulmonary disease with (acute) exacerbation: Secondary | ICD-10-CM | POA: Diagnosis not present

## 2013-04-21 DIAGNOSIS — Z4789 Encounter for other orthopedic aftercare: Secondary | ICD-10-CM | POA: Diagnosis not present

## 2013-04-25 DIAGNOSIS — R269 Unspecified abnormalities of gait and mobility: Secondary | ICD-10-CM | POA: Diagnosis not present

## 2013-04-25 DIAGNOSIS — Z466 Encounter for fitting and adjustment of urinary device: Secondary | ICD-10-CM | POA: Diagnosis not present

## 2013-04-25 DIAGNOSIS — J441 Chronic obstructive pulmonary disease with (acute) exacerbation: Secondary | ICD-10-CM | POA: Diagnosis not present

## 2013-04-25 DIAGNOSIS — Z4801 Encounter for change or removal of surgical wound dressing: Secondary | ICD-10-CM | POA: Diagnosis not present

## 2013-04-25 DIAGNOSIS — Z4789 Encounter for other orthopedic aftercare: Secondary | ICD-10-CM | POA: Diagnosis not present

## 2013-04-25 DIAGNOSIS — G609 Hereditary and idiopathic neuropathy, unspecified: Secondary | ICD-10-CM | POA: Diagnosis not present

## 2013-04-26 ENCOUNTER — Encounter: Payer: Self-pay | Admitting: Physical Medicine & Rehabilitation

## 2013-04-26 ENCOUNTER — Encounter (INDEPENDENT_AMBULATORY_CARE_PROVIDER_SITE_OTHER): Payer: Self-pay

## 2013-04-26 ENCOUNTER — Encounter: Payer: Medicare Other | Attending: Physical Medicine & Rehabilitation | Admitting: Physical Medicine & Rehabilitation

## 2013-04-26 VITALS — BP 101/55 | HR 84 | Resp 14 | Ht 72.0 in | Wt 186.0 lb

## 2013-04-26 DIAGNOSIS — Z860100 Personal history of colon polyps, unspecified: Secondary | ICD-10-CM | POA: Insufficient documentation

## 2013-04-26 DIAGNOSIS — K219 Gastro-esophageal reflux disease without esophagitis: Secondary | ICD-10-CM | POA: Insufficient documentation

## 2013-04-26 DIAGNOSIS — M48061 Spinal stenosis, lumbar region without neurogenic claudication: Secondary | ICD-10-CM | POA: Insufficient documentation

## 2013-04-26 DIAGNOSIS — R339 Retention of urine, unspecified: Secondary | ICD-10-CM

## 2013-04-26 DIAGNOSIS — J449 Chronic obstructive pulmonary disease, unspecified: Secondary | ICD-10-CM | POA: Diagnosis not present

## 2013-04-26 DIAGNOSIS — E785 Hyperlipidemia, unspecified: Secondary | ICD-10-CM | POA: Diagnosis not present

## 2013-04-26 DIAGNOSIS — M4310 Spondylolisthesis, site unspecified: Secondary | ICD-10-CM

## 2013-04-26 DIAGNOSIS — I10 Essential (primary) hypertension: Secondary | ICD-10-CM | POA: Insufficient documentation

## 2013-04-26 DIAGNOSIS — M4306 Spondylolysis, lumbar region: Secondary | ICD-10-CM

## 2013-04-26 DIAGNOSIS — Z87442 Personal history of urinary calculi: Secondary | ICD-10-CM | POA: Diagnosis not present

## 2013-04-26 DIAGNOSIS — M4714 Other spondylosis with myelopathy, thoracic region: Secondary | ICD-10-CM | POA: Insufficient documentation

## 2013-04-26 DIAGNOSIS — I2510 Atherosclerotic heart disease of native coronary artery without angina pectoris: Secondary | ICD-10-CM | POA: Diagnosis not present

## 2013-04-26 DIAGNOSIS — I252 Old myocardial infarction: Secondary | ICD-10-CM | POA: Insufficient documentation

## 2013-04-26 DIAGNOSIS — J4489 Other specified chronic obstructive pulmonary disease: Secondary | ICD-10-CM | POA: Insufficient documentation

## 2013-04-26 DIAGNOSIS — M419 Scoliosis, unspecified: Secondary | ICD-10-CM

## 2013-04-26 DIAGNOSIS — M4120 Other idiopathic scoliosis, site unspecified: Secondary | ICD-10-CM

## 2013-04-26 NOTE — Patient Instructions (Signed)
PLEASE CALL ME WITH ANY PROBLEMS OR QUESTIONS (#[phone]).      

## 2013-04-26 NOTE — Progress Notes (Signed)
Subjective:    Patient ID: Jason Cross, male    DOB: Mar 06, 1928, 80 y.o.   MRN: [id number]  HPI  Jason Cross is back regarding his lumbar stenosis s/p decompression with associated myelopathy/radiculopathy. He has completed [place] therapy which worked well for him. He still walks with a cane. His wife has some concerns still with his balance and coordination. He finds it difficult sometimes to stand after sitting a prolonged time.   His pain levels have been good. He uses an occasional advil for pain control  His bladder is improving. He is seeing urology presently. He had difficulites with urine retention at the end of his rehab admit. He went to the ED in early January for urine retention as well.  Pain Inventory Average Pain 3 Pain Right Now 0 My pain is intermittent, burning, stabbing and tingling  In the last 24 hours, has pain interfered with the following? General activity 1 Relation with others 0 Enjoyment of life 8 What TIME of day is your pain at its worst? daytime Sleep (in general) Good  Pain is worse with: unsure Pain improves with: rest Relief from Meds: 9  Mobility walk without assistance use a cane how many minutes can you walk? 10 ability to climb steps?  no do you drive?  no  Function retired I need assistance with the following:  bathing  Neuro/Psych weakness numbness tremor tingling confusion  Prior Studies Any changes since last visit?  no  Physicians involved in your care Dr Jeananne Rama, Dr Vertell Limber   History reviewed. No pertinent family history. History   Social History  . Marital Status: Married    Spouse Name: N/A    Number of Children: N/A  . Years of Education: N/A   Social History Main Topics  . Smoking status: Former Research scientist (life sciences)  . Smokeless tobacco: None     Comment: quit smoking 15 yrs ago  . Alcohol Use: No  . Drug Use: No  . Sexual Activity: None   Other Topics Concern  . None   Social History Narrative  . None    Past Surgical History  Procedure Laterality Date  . Neck surgery  1962  . Back surgery  2007  . Microthermo prostate  2009  . Tonsillectomy      at age 84  . Colonoscopy    . Eye surgery      implants  . Coronary artery bypass graft  2009    LIMA to LAD, SVG to D1 07/2007  . Thoracic discectomy N/A 03/10/2013    Procedure: Thoracic twelve-Lumbar one Laminectomy;  Surgeon: Erline Levine, MD;  Location: [facility];  Service: Neurosurgery;  Laterality: N/A;  Thoracic twelve-Lumbar one Laminectomy   Past Medical History  Diagnosis Date  . Hyperlipidemia     takes Simvastatin daily  . Hypertension     takes HCTZ daily  . Coronary artery disease   . Peripheral neuropathy     takes Gabapentin daily  . Myocardial infarction   . COPD (chronic obstructive pulmonary disease)   . Pneumonia     hx of in the 60's  . Dizziness   . Arthritis   . Chronic back pain   . GERD (gastroesophageal reflux disease)     takes Omeprazole every other day  . Gastric ulcer     in the 60's  . History of colon polyps   . History of kidney stones   . Kidney stone     now lodged on left  side but so low that it won't move per pt and wife  . Cataracts, bilateral   . Hard of hearing     doesn't wear hearing aids  . Sleep apnea    BP 101/55  Pulse 84  Resp 14  Ht 6' (1.829 m)  Wt 186 lb (84.369 kg)  BMI 25.22 kg/m2  SpO2 97%  Opioid Risk Score:   Fall Risk Score: High Fall Risk (>13 points) (Patient educated handout given)   Review of Systems  Neurological: Positive for tremors, weakness and numbness.       Tingling  Psychiatric/Behavioral: Positive for confusion.  All other systems reviewed and are negative.       Objective:   Physical Exam  Constitutional: He is oriented to person, place, and time. He appears well-developed and well-nourished.  HENT:  Head: Normocephalic and atraumatic.  Right Ear: External ear normal.  Left Ear: External ear normal.  Eyes: Conjunctivae and EOM  are normal. Pupils are equal, round, and reactive to light.  Neck: Normal range of motion. Neck supple.  Cardiovascular: Normal rate, regular rhythm and normal heart sounds.  Respiratory: Effort normal and breath sounds normal.  GI: Soft. Bowel sounds are normal. He exhibits no distension. There is no tenderness. There is no rebound and no guarding.  Musculoskeletal:  Back incision healed. Neurological: He is alert and oriented to person, place, and time. He has normal reflexes. No cranial nerve deficit. He exhibits normal muscle tone. HOH 5/5 bilateral deltoid, bicep, tricep, grip 4+/5 bilateral hip flexor knee extensor and, ankle dorsiflexion and plantarflexion.  Sensation 1+ RLE, 2- LLE. Decreased fine movements R>L LE.  Walks with a steppage pattern on right. Sometimes drags foot.  Skin: Skin is warm and dry.    Psychiatric: He has a normal mood and affect. His behavior is normal    Assessment/Plan:  1. Functional deficits secondary to thoraco-lumbar stenosis with myelopathy.  -made a referral to outpt PT to further work on balance, coordination, strength, and to establish a HEP.  2. Pain Management: tylenol or advil prn 3. Urine retention: per urology  15 minutes of face to face patient care time were spent during this visit. All questions were encouraged and answered.  I will see him back prn

## 2013-04-28 DIAGNOSIS — Z4801 Encounter for change or removal of surgical wound dressing: Secondary | ICD-10-CM | POA: Diagnosis not present

## 2013-04-28 DIAGNOSIS — Z466 Encounter for fitting and adjustment of urinary device: Secondary | ICD-10-CM | POA: Diagnosis not present

## 2013-04-28 DIAGNOSIS — Z4789 Encounter for other orthopedic aftercare: Secondary | ICD-10-CM | POA: Diagnosis not present

## 2013-04-28 DIAGNOSIS — J441 Chronic obstructive pulmonary disease with (acute) exacerbation: Secondary | ICD-10-CM | POA: Diagnosis not present

## 2013-04-28 DIAGNOSIS — G609 Hereditary and idiopathic neuropathy, unspecified: Secondary | ICD-10-CM | POA: Diagnosis not present

## 2013-04-28 DIAGNOSIS — R269 Unspecified abnormalities of gait and mobility: Secondary | ICD-10-CM | POA: Diagnosis not present

## 2013-05-01 DIAGNOSIS — M4120 Other idiopathic scoliosis, site unspecified: Secondary | ICD-10-CM | POA: Diagnosis not present

## 2013-05-01 DIAGNOSIS — M4714 Other spondylosis with myelopathy, thoracic region: Secondary | ICD-10-CM | POA: Diagnosis not present

## 2013-05-01 DIAGNOSIS — M48061 Spinal stenosis, lumbar region without neurogenic claudication: Secondary | ICD-10-CM | POA: Diagnosis not present

## 2013-05-01 DIAGNOSIS — IMO0002 Reserved for concepts with insufficient information to code with codable children: Secondary | ICD-10-CM | POA: Diagnosis not present

## 2013-05-02 ENCOUNTER — Encounter (HOSPITAL_COMMUNITY): Payer: Self-pay | Admitting: Pharmacy Technician

## 2013-05-04 ENCOUNTER — Other Ambulatory Visit (HOSPITAL_COMMUNITY): Payer: Self-pay | Admitting: Orthopedic Surgery

## 2013-05-04 NOTE — Progress Notes (Signed)
ekg 03-10-13 epic Chest ct 12-19-12 epic

## 2013-05-04 NOTE — Patient Instructions (Addendum)
[place]  05/04/2013   Your procedure is scheduled on: Wednesday March 18th  Report to [facility] at 630  AM.  Call this number if you have problems the morning of surgery [phone]   Remember:  Do not eat food or drink liquids :After Midnight.     Take these medicines the morning of surgery with A SIP OF WATER: gabapentin, prilosec                                SEE [place]             You may not have any metal on your body including hair pins and piercings  Do not wear jewelry, make-up.  Do not wear lotions, powders, or perfumes. No  Deodorant is to be worn.   Men may shave face and neck.  Do not bring valuables to the hospital. [place].  Contacts, dentures or bridgework may not be worn into surgery.     Patients discharged the day of surgery will not be allowed to drive home.  Name and phone number of your driver: Jackelyn Poling [phone]   Please read over the following fact sheets that you were given: [facility] Preparing for surgery sheet.  Call Paulette Blanch RN pre op nurse if needed [phone]    [place] IN THE CANCELLATION OF YOUR SURGERY.  PATIENT SIGNATURE___________________________________________  NURSE SIGNATURE_____________________________________________

## 2013-05-05 ENCOUNTER — Encounter (HOSPITAL_COMMUNITY)
Admission: RE | Admit: 2013-05-05 | Discharge: 2013-05-05 | Disposition: A | Discharge status: Home / Self Care | Payer: Medicare Other | Source: Ambulatory Visit | Attending: Urology | Admitting: Urology

## 2013-05-05 ENCOUNTER — Encounter (HOSPITAL_COMMUNITY): Payer: Self-pay

## 2013-05-05 DIAGNOSIS — Z01812 Encounter for preprocedural laboratory examination: Secondary | ICD-10-CM | POA: Insufficient documentation

## 2013-05-05 HISTORY — DX: Other specified forms of tremor: G25.2

## 2013-05-05 HISTORY — DX: Benign prostatic hyperplasia without lower urinary tract symptoms: N40.0

## 2013-05-05 LAB — CBC
HCT: 37.4 % — ABNORMAL LOW (ref 39.0–52.0)
Hemoglobin: 12.8 g/dL — ABNORMAL LOW (ref 13.0–17.0)
MCH: 30.1 pg (ref 26.0–34.0)
MCHC: 34.2 g/dL (ref 30.0–36.0)
MCV: 88 fL (ref 78.0–100.0)
Platelets: 235 K/uL (ref 150–400)
RBC: 4.25 MIL/uL (ref 4.22–5.81)
RDW: 14.3 % (ref 11.5–15.5)
WBC: 8.3 K/uL (ref 4.0–10.5)

## 2013-05-05 LAB — BASIC METABOLIC PANEL WITH GFR
BUN: 21 mg/dL (ref 6–23)
CO2: 26 meq/L (ref 19–32)
Calcium: 9.8 mg/dL (ref 8.4–10.5)
Chloride: 104 meq/L (ref 96–112)
Creatinine, Ser: 1.4 mg/dL — ABNORMAL HIGH (ref 0.50–1.35)
GFR calc Af Amer: 52 mL/min — ABNORMAL LOW
GFR calc non Af Amer: 45 mL/min — ABNORMAL LOW
Glucose, Bld: 95 mg/dL (ref 70–99)
Potassium: 4.9 meq/L (ref 3.7–5.3)
Sodium: 140 meq/L (ref 137–147)

## 2013-05-06 LAB — URINE CULTURE

## 2013-05-10 ENCOUNTER — Ambulatory Visit (HOSPITAL_COMMUNITY): Payer: Medicare Other | Admitting: Anesthesiology

## 2013-05-10 ENCOUNTER — Encounter (HOSPITAL_COMMUNITY): Payer: Self-pay | Admitting: *Deleted

## 2013-05-10 ENCOUNTER — Encounter (HOSPITAL_COMMUNITY): Payer: Medicare Other | Admitting: Anesthesiology

## 2013-05-10 ENCOUNTER — Ambulatory Visit (HOSPITAL_COMMUNITY)
Admission: RE | Admit: 2013-05-10 | Discharge: 2013-05-10 | Disposition: A | Discharge status: Home / Self Care | Payer: Medicare Other | Source: Ambulatory Visit | Attending: Urology | Admitting: Urology

## 2013-05-10 ENCOUNTER — Encounter (HOSPITAL_COMMUNITY)
Admission: RE | Disposition: A | Discharge status: Home / Self Care | Payer: Self-pay | Source: Ambulatory Visit | Attending: Urology

## 2013-05-10 DIAGNOSIS — G4730 Sleep apnea, unspecified: Secondary | ICD-10-CM | POA: Diagnosis not present

## 2013-05-10 DIAGNOSIS — I252 Old myocardial infarction: Secondary | ICD-10-CM | POA: Insufficient documentation

## 2013-05-10 DIAGNOSIS — N289 Disorder of kidney and ureter, unspecified: Secondary | ICD-10-CM | POA: Insufficient documentation

## 2013-05-10 DIAGNOSIS — I10 Essential (primary) hypertension: Secondary | ICD-10-CM | POA: Diagnosis not present

## 2013-05-10 DIAGNOSIS — K219 Gastro-esophageal reflux disease without esophagitis: Secondary | ICD-10-CM | POA: Insufficient documentation

## 2013-05-10 DIAGNOSIS — R339 Retention of urine, unspecified: Secondary | ICD-10-CM | POA: Insufficient documentation

## 2013-05-10 DIAGNOSIS — N400 Enlarged prostate without lower urinary tract symptoms: Secondary | ICD-10-CM | POA: Diagnosis not present

## 2013-05-10 DIAGNOSIS — J4489 Other specified chronic obstructive pulmonary disease: Secondary | ICD-10-CM | POA: Insufficient documentation

## 2013-05-10 DIAGNOSIS — N401 Enlarged prostate with lower urinary tract symptoms: Secondary | ICD-10-CM | POA: Diagnosis not present

## 2013-05-10 DIAGNOSIS — N138 Other obstructive and reflux uropathy: Secondary | ICD-10-CM | POA: Insufficient documentation

## 2013-05-10 DIAGNOSIS — I2510 Atherosclerotic heart disease of native coronary artery without angina pectoris: Secondary | ICD-10-CM | POA: Diagnosis not present

## 2013-05-10 DIAGNOSIS — Z951 Presence of aortocoronary bypass graft: Secondary | ICD-10-CM | POA: Diagnosis not present

## 2013-05-10 DIAGNOSIS — J449 Chronic obstructive pulmonary disease, unspecified: Secondary | ICD-10-CM | POA: Insufficient documentation

## 2013-05-10 DIAGNOSIS — N320 Bladder-neck obstruction: Secondary | ICD-10-CM | POA: Insufficient documentation

## 2013-05-10 HISTORY — PX: GREEN LIGHT LASER TURP (TRANSURETHRAL RESECTION OF PROSTATE: SHX6260

## 2013-05-10 SURGERY — GREEN LIGHT LASER TURP (TRANSURETHRAL RESECTION OF PROSTATE
Anesthesia: General

## 2013-05-10 MED ORDER — FENTANYL CITRATE 0.05 MG/ML IJ SOLN
INTRAMUSCULAR | Status: AC
Start: 2013-05-10 — End: 2013-05-10
  Filled 2013-05-10: qty 2

## 2013-05-10 MED ORDER — HYDROMORPHONE HCL PF 1 MG/ML IJ SOLN
INTRAMUSCULAR | Status: AC
  Filled 2013-05-10: qty 1

## 2013-05-10 MED ORDER — PROMETHAZINE HCL 25 MG/ML IJ SOLN: 6.2500 mg | INTRAMUSCULAR | Status: DC | PRN

## 2013-05-10 MED ORDER — BELLADONNA ALKALOIDS-OPIUM 16.2-60 MG RE SUPP: 1.0000 | Freq: Every day | RECTAL | Status: DC

## 2013-05-10 MED ORDER — ONDANSETRON HCL 4 MG/2ML IJ SOLN
INTRAMUSCULAR | Status: AC
  Filled 2013-05-10: qty 2

## 2013-05-10 MED ORDER — HYDROMORPHONE HCL PF 1 MG/ML IJ SOLN
0.2500 mg | INTRAMUSCULAR | Status: DC | PRN
  Administered 2013-05-10: 0.25 mg via INTRAVENOUS

## 2013-05-10 MED ORDER — ONDANSETRON HCL 4 MG/2ML IJ SOLN
INTRAMUSCULAR | Status: DC | PRN
  Administered 2013-05-10: 4 mg via INTRAVENOUS

## 2013-05-10 MED ORDER — DOCUSATE SODIUM 100 MG PO CAPS: 100.0000 mg | ORAL_CAPSULE | Freq: Two times a day (BID) | ORAL | Status: DC | PRN

## 2013-05-10 MED ORDER — LIDOCAINE HCL (CARDIAC) 20 MG/ML IV SOLN
INTRAVENOUS | Status: DC | PRN
  Administered 2013-05-10: 50 mg via INTRAVENOUS

## 2013-05-10 MED ORDER — LIDOCAINE HCL 2 % EX GEL
CUTANEOUS | Status: AC
  Filled 2013-05-10: qty 10

## 2013-05-10 MED ORDER — EPHEDRINE SULFATE 50 MG/ML IJ SOLN
INTRAMUSCULAR | Status: AC
  Filled 2013-05-10: qty 1

## 2013-05-10 MED ORDER — ETOMIDATE 2 MG/ML IV SOLN
INTRAVENOUS | Status: DC | PRN
  Administered 2013-05-10: 10 mg via INTRAVENOUS

## 2013-05-10 MED ORDER — PHENYLEPHRINE HCL 10 MG/ML IJ SOLN
INTRAMUSCULAR | Status: DC | PRN
  Administered 2013-05-10: 60 ug via INTRAVENOUS
  Administered 2013-05-10 (×3): 40 ug via INTRAVENOUS

## 2013-05-10 MED ORDER — MEPERIDINE HCL 50 MG/ML IJ SOLN: 6.2500 mg | INTRAMUSCULAR | Status: DC | PRN

## 2013-05-10 MED ORDER — ETOMIDATE 2 MG/ML IV SOLN
INTRAVENOUS | Status: AC
  Filled 2013-05-10: qty 10

## 2013-05-10 MED ORDER — LACTATED RINGERS IV SOLN
INTRAVENOUS | Status: DC | PRN
  Administered 2013-05-10: 08:00:00 via INTRAVENOUS

## 2013-05-10 MED ORDER — SODIUM CHLORIDE 0.9 % IR SOLN
Status: DC | PRN
  Administered 2013-05-10: 13000 mL

## 2013-05-10 MED ORDER — PROPOFOL 10 MG/ML IV BOLUS
INTRAVENOUS | Status: DC | PRN
  Administered 2013-05-10: 20 mg via INTRAVENOUS
  Administered 2013-05-10: 100 mg via INTRAVENOUS

## 2013-05-10 MED ORDER — FENTANYL CITRATE 0.05 MG/ML IJ SOLN
INTRAMUSCULAR | Status: AC
  Filled 2013-05-10: qty 2

## 2013-05-10 MED ORDER — PHENYLEPHRINE 40 MCG/ML (10ML) SYRINGE FOR IV PUSH (FOR BLOOD PRESSURE SUPPORT)
PREFILLED_SYRINGE | INTRAVENOUS | Status: AC
  Filled 2013-05-10: qty 10

## 2013-05-10 MED ORDER — FENTANYL CITRATE 0.05 MG/ML IJ SOLN
INTRAMUSCULAR | Status: DC | PRN
  Administered 2013-05-10 (×2): 25 ug via INTRAVENOUS
  Administered 2013-05-10: 50 ug via INTRAVENOUS
  Administered 2013-05-10: 25 ug via INTRAVENOUS

## 2013-05-10 MED ORDER — PROPOFOL 10 MG/ML IV BOLUS
INTRAVENOUS | Status: AC
  Filled 2013-05-10: qty 20

## 2013-05-10 MED ORDER — CIPROFLOXACIN IN D5W 400 MG/200ML IV SOLN
INTRAVENOUS | Status: DC | PRN
  Administered 2013-05-10: 400 mg via INTRAVENOUS

## 2013-05-10 MED ORDER — SODIUM CHLORIDE 0.9 % IJ SOLN
INTRAMUSCULAR | Status: AC
  Filled 2013-05-10: qty 10

## 2013-05-10 MED ORDER — CIPROFLOXACIN IN D5W 400 MG/200ML IV SOLN
INTRAVENOUS | Status: AC
  Filled 2013-05-10: qty 200

## 2013-05-10 MED ORDER — ACETAMINOPHEN-CODEINE #3 300-30 MG PO TABS: 1.0000 | ORAL_TABLET | ORAL | Status: DC | PRN

## 2013-05-10 MED ORDER — LIDOCAINE HCL (CARDIAC) 20 MG/ML IV SOLN
INTRAVENOUS | Status: AC
  Filled 2013-05-10: qty 5

## 2013-05-10 SURGICAL SUPPLY — 23 items
BAG URINE DRAINAGE (UROLOGICAL SUPPLIES) ×2 IMPLANT
BAG URO CATCHER STRL LF (DRAPE) ×2 IMPLANT
CATH FOLEY 2WAY SLVR  5CC 18FR (CATHETERS)
CATH FOLEY 2WAY SLVR 18FR 30CC (CATHETERS) ×1 IMPLANT
CATH FOLEY 2WAY SLVR 5CC 18FR (CATHETERS) IMPLANT
CLOTH BEACON ORANGE TIMEOUT ST (SAFETY) ×2 IMPLANT
DRAPE CAMERA CLOSED 9X96 (DRAPES) ×2 IMPLANT
ELECT BUTTON HF 24-28F 2 30DE (ELECTRODE) IMPLANT
ELECT LOOP MED HF 24F 12D (CUTTING LOOP) IMPLANT
ELECT LOOP MED HF 24F 12D CBL (CLIP) ×1 IMPLANT
ELECT RESECT VAPORIZE 12D CBL (ELECTRODE) IMPLANT
FEE RENTAL LASER GREENLIGHT (Laser) ×1 IMPLANT
GLOVE BIO SURGEON STRL SZ7.5 (GLOVE) ×4 IMPLANT
GOWN STRL REIN XL XLG (GOWN DISPOSABLE) ×3 IMPLANT
HOLDER FOLEY CATH W/STRAP (MISCELLANEOUS) ×1 IMPLANT
IV NS 1000ML (IV SOLUTION)
IV NS 1000ML BAXH (IV SOLUTION) ×1 IMPLANT
IV NS IRRIG 3000ML ARTHROMATIC (IV SOLUTION) ×1 IMPLANT
LASER FIBER /GREENLIGHT LASER (Laser) ×2 IMPLANT
LASER GREENLIGHT RENTAL P/PROC (Laser) ×2 IMPLANT
PACK CYSTOSCOPY (CUSTOM PROCEDURE TRAY) ×2 IMPLANT
SYR 30ML LL (SYRINGE) ×1 IMPLANT
SYRINGE IRR TOOMEY STRL 70CC (SYRINGE) IMPLANT

## 2013-05-10 NOTE — Op Note (Signed)
Preoperative diagnosis:  1. Bladder outlet obstruction secondary to enlarged prostate 2. Lower urinary tract symptoms 3. Urinary retention  Postoperative diagnosis:  1. As above   Procedure: 1. Cystoscopy 2. Greenlight laser prostate vaporization  Surgeon: Ardis Hughs, MD  Anesthesia: General  Complications: None  Intraoperative findings: Patient had a large median lobe which was contiguous with his left lateral lobe. At the end of the case a large fossa had been created within the prostatic urethra. An 43 French two-way Foley catheter with a 30 cc balloon was placed at the end of the case with clear reflux. Lasing time: 44:37  Total joules: 318, 570 Watts: 80-160  EBL: Minimal  Specimens: None  Indication: Jason Cross is a 80 y.o. patient with history of lower urinary tract symptoms. The patient has developed retention several times. He underwent a microwave ablation several years ago and his symptoms have progressed again.  After reviewing the management options for treatment, he elected to proceed with the above surgical procedure(s). We have discussed the potential benefits and risks of the procedure, side effects of the proposed treatment, the likelihood of the patient achieving the goals of the procedure, and any potential problems that might occur during the procedure or recuperation. Informed consent has been obtained.  Description of procedure:  The patient was taken to the operating room and general anesthesia was induced.  The patient was placed in the dorsal lithotomy position, prepped and draped in the usual sterile fashion, and preoperative antibiotics were administered. A preoperative time-out was performed.   The 30 cystoscopic lens was used with the greenlight laser sheath and gently inserted into the patient's urethra under visual guidance using the visual obturator. A 360 cystoscopic evaluation with performed with the above findings. The right  ureteral orifice was easily visualized away from the bladder neck. The left ureteral orifice was retracted into the median lobe somewhat.  The laser bridge was then inserted into the scope and exchanged for the visual obturator and the starting at 54 W the median lobe was proved patent the 5 and 7:00 position. This was starting at the bladder neck and working down to the verumontanum. I then spent a significant amount of time vaporizing the median lobe using a range of 120 W to 140 W. Once the median lobe was complete from his my attention on the left lateral lobe. This was a vaporized in a systematic fashion from approximately 2:00 to 5:00 position, the tissue was vaporized to the prostatic capsule. The right lateral lobe was then vaporized in a similar fashion. Once satisfactory vaporization had been completed pulled the scope back to the verumontanum and noted a large prostatic fossa wide-open bladder neck. This point I turned off all the irrigation and there was no significant bleeding. I then irrigated the bladder several times through the scope to remove the large pieces of tissue. An 14 French 30 cc 2-way Foley catheter was then inserted into the patient's urethra. 30 cc of sterile water was inserted into the balloon. The catheter was then irrigated multiple times to get the small ablated fragments removed. Reflux remained clear.  The patient was subsequently awoken and returned to the PACU in stable condition.  Disposition: The patient will be discharged home with a Foley catheter. Followup has been arranged for 2 days for a voiding trial.      Ardis Hughs, M.D.

## 2013-05-10 NOTE — Interval H&P Note (Signed)
History and Physical Interval Note:  05/10/2013 6:12 AM  Jason Cross  has presented today for surgery, with the diagnosis of BENIGN PROSTATIC HYPERPLASIA  The various methods of treatment have been discussed with the patient and family. After consideration of risks, benefits and other options for treatment, the patient has consented to  Procedure(s): GREEN LIGHT LASER TURP (TRANSURETHRAL RESECTION OF PROSTATE (N/A) as a surgical intervention .  The patient's history has been reviewed, patient examined, no change in status, stable for surgery.  I have reviewed the patient's chart and labs.  Questions were answered to the patient's satisfaction.     Louis Meckel W

## 2013-05-10 NOTE — Anesthesia Postprocedure Evaluation (Signed)
Anesthesia Post Note  Patient: Jason Cross  Procedure(s) Performed: Procedure(s) (LRB): GREEN LIGHT LASER TURP (TRANSURETHRAL RESECTION OF PROSTATE (N/A)  Anesthesia type: General  Patient location: PACU  Post pain: Pain level controlled  Post assessment: Post-op Vital signs reviewed  Last Vitals: BP 124/70  Pulse 66  Temp(Src) 36.4 C (Oral)  Resp 12  SpO2 98%  Post vital signs: Reviewed  Level of consciousness: sedated  Complications: No apparent anesthesia complications

## 2013-05-10 NOTE — Anesthesia Preprocedure Evaluation (Addendum)
Anesthesia Evaluation  Patient identified by MRN, date of birth, ID band Patient awake    Reviewed: Allergy & Precautions, H&P , NPO status , Patient's Chart, lab work & pertinent test results, reviewed documented beta blocker date and time   Airway Mallampati: II TM Distance: >3 FB Neck ROM: Full    Dental  (+) Edentulous Upper, Edentulous Lower   Pulmonary sleep apnea and Continuous Positive Airway Pressure Ventilation , pneumonia -, COPDformer smoker,  breath sounds clear to auscultation  Pulmonary exam normal       Cardiovascular hypertension, Pt. on medications + CAD, + Past MI and + CABG Rhythm:Regular Rate:Normal     Neuro/Psych  Neuromuscular disease negative psych ROS   GI/Hepatic Neg liver ROS, PUD, GERD-  Medicated and Controlled,  Endo/Other    Renal/GU Renal InsufficiencyRenal disease     Musculoskeletal negative musculoskeletal ROS (+)   Abdominal Normal abdominal exam  (+)   Peds  Hematology negative hematology ROS (+)   Anesthesia Other Findings   Reproductive/Obstetrics                          Anesthesia Physical  Anesthesia Plan  ASA: III  Anesthesia Plan: General   Post-op Pain Management:    Induction: Intravenous  Airway Management Planned: LMA  Additional Equipment:   Intra-op Plan:   Post-operative Plan: Extubation in OR  Informed Consent: I have reviewed the patients History and Physical, chart, labs and discussed the procedure including the risks, benefits and alternatives for the proposed anesthesia with the patient or authorized representative who has indicated his/her understanding and acceptance.   Dental advisory given  Plan Discussed with: CRNA  Anesthesia Plan Comments:         Anesthesia Quick Evaluation

## 2013-05-10 NOTE — H&P (Signed)
Reason For Visit Follow-up from urinary retention consult in [facility] emergency department   History of Present Illness This an 80 year old male with a history of lower urinary tract symptoms. 2009 he had a micro-wave procedure done in the office by Dr. Eliberto Ivory in [facility]. His voiding symptoms improved initially but have progressed the last several years. He has a history of urinary retention following surgeries. He recently had a back surgery and had urinary retention while in the hospital. After the Foley was removed he was placed on clean intermittent catheterization. He continued to fail voiding trials and subsequently a Foley catheter was placed. His Foley was removed while in rehab and he didn't void again for 21/2 days. In Emergency department the patient denied any dysuria fevers or chills. He did have suprapubic pain. Bladder scan was performed and 800 cc of urine was recorded. Several attempts at placing a catheter were unsuccessful by the nursing staff. I was able to pass the catheter quite easily in the emergency department.   Since his most recent operation the patient has been on Flomax, however, he has not been on a prior to this. He is also been on ciprofloxacin as empiric therapy for urinary tract infection. He gets up several times at night, had has frequency during the day, does not feel like he empties his bladder well. The patient relates that he often has constipation and his urinary symptoms are worse when he is constipated. Denies any bone pain or back pain. He denies any constitutional symptoms such as weight loss or night sweats. Denies any weakness in his extremities or peripheral edema.    Interval: The patient presents today for a voiding trial. He has not had any issues with his Foley catheter. He took his last dose of ciprofloxacin yesterday. Patient is off his pain medication. He is not taking any narcotics for over a week. He is otherwise doing very well. Denies  constipation.    Past Medical History Problems  1. History of arthritis (V13.4) 2. History of cardiac disorder (V12.50) 3. History of esophageal reflux (V12.79) 4. History of hypercholesterolemia (V12.29) 5. History of hypertension (V12.59) 6. History of myocardial infarction (412) 7. History of sleep apnea (V13.89)  Surgical History Problems  1. History of Back Surgery 2. History of Heart Surgery 3. History of Neck Surgery 4. History of Prostate Surgery  Current Meds 1. Aspirin 81 MG Oral Tablet;  Therapy: (Recorded:09Feb2015) to Recorded 2. Hydrocodone-Acetaminophen 5-325 MG Oral Tablet;  Therapy: (Recorded:09Feb2015) to Recorded 3. Methocarbamol 500 MG Oral Tablet;  Therapy: (Recorded:09Feb2015) to Recorded 4. Neurontin 300 MG Oral Capsule;  Therapy: (Recorded:09Feb2015) to Recorded 5. Omeprazole 20 MG Oral Capsule Delayed Release;  Therapy: (Recorded:09Feb2015) to Recorded 6. Senna-Docusate Sodium 8.6-50 MG Oral Tablet;  Therapy: (Recorded:09Feb2015) to Recorded 7. Simvastatin 40 MG Oral Tablet;  Therapy: (Recorded:09Feb2015) to Recorded 8. Tamsulosin HCl - 0.4 MG Oral Capsule;  Therapy: (Recorded:09Feb2015) to Recorded  Allergies Medication  1. Codeine Derivatives Non-Medication  2. Contrast Dye  Family History Problems  1. Family history of arthritis (V17.7) : Mother 2. Family history of malignant neoplasm of breast (V16.3) : Sister 3. Family history of myocardial infarction (V17.3) : Sister, Father 76. Family history of renal failure (V18.69) : Sister  Social History Problems  1. Denied: History of Alcohol use 2. Caffeine use (V49.89) 3. Death in the family, father   age 47 heart attack 4. Death in the family, mother   age 8 5. Former smoker (V15.82) 6. Married 7.  Retired 8. Two children  Review of Systems No changes in pts bowel habits, neurological changes, or progressive lower urinary tract symptoms.   Genitourinary: urinary frequency,  feelings of urinary urgency, nocturia, incontinence, difficulty starting the urinary stream, weak urinary stream, urinary stream starts and stops, post-void dribbling, cloudy urine and initiating urination requires straining.  Constitutional: feeling tired (fatigue).  ENT: no sinus problems.  Respiratory: shortness of breath.  Musculoskeletal: back pain and joint pain.    Vitals Vital Signs [Data Includes: Last 1 Day]  Recorded: 09Feb2015 08:33AM  Height: 6 ft  Weight: 175 lb  BMI Calculated: 23.73 BSA Calculated: 2.01 Blood Pressure: 145 / 80 Temperature: 98.2 F Heart Rate: 80  Physical Exam Constitutional: Well nourished and well developed . No acute distress.  Abdomen: The abdomen is soft and nontender. No masses are palpated. No CVA tenderness. No hernias are palpable. No hepatosplenomegaly noted.  Genitourinary: Examination of the penis demonstrates an indwelling catheter, but no lesions and a normal meatus. The scrotum is normal in appearance. The right testis is palpably normal. The left testis is normal.  Lymphatics: The femoral and inguinal nodes are not enlarged or tender.  Skin: Normal skin turgor, no visible rash and no visible skin lesions.  Neuro/Psych:. Mood and affect are appropriate.    Procedure  Procedure: Cystoscopy  Chaperone Present: Marchelle Folks.  Indication: Lower Urinary Tract Symptoms.  Informed Consent: from the patient . Specific risks including, but not limited to bleeding, infection, pain, allergic reaction etc. were explained.  Prep: The patient was prepped with hibiclens.  Anesthesia:. Local anesthesia was administered intraurethrally with 2% lidocaine jelly.  Antibiotic prophylaxis: Ciprofloxacin.  Procedure Note:  Urethral meatus:. No abnormalities.  Anterior urethra: No abnormalities.  Prostatic urethra:. Estimated length was 3-4cm cm. The lateral prostatic lobes were enlarged.  Bladder: Visulization was clear. The ureteral orifices were in the  normal anatomic position bilaterally. Examination of the bladder demonstrated trabeculation. The patient tolerated the procedure well.  Complications: None.    Assessment Assessed  1. Urinary retention (788.20)  Urinary retention in the post-operative setting, in patient with history of lower urinary tract symptoms. After removing the Foley catheter the patient was able to void approximately 100 cc with a reasonable stream. However, the patient is interested and proceeding with more aggressive interventions to help facilitate better voiding. Based on cystoscopy, the patient has bilateral lobar obstructing prostate. I suspect the patient would do well following a bladder outlet procedure.   Plan Urinary retention  1. Follow-up Schedule Surgery Office  Follow-up  Status: Complete  Done: 09Feb2015 2. Cysto; Status:Complete;   Done: 09Feb2015  Discussion/Summary We discussed the options of management ranging from continued watchful waiting. Medical management minimally invasive procedures including TURP and laser ablative and enucleation procedures.    Based on the patient's symptoms and degree of bother on medical therapy I recommended surgical management of the patient's symptoms. I outlined both TURP and the laser ablative prostatectomy. I explained to him the advantages of TURP including the ability to obtain tissue to rule out prostate cancer as well as the proven long-term efficacy of this treatment. Further, I outlined the risk and benefits of the operation, and he understands the need to stay overnight for catheter irrigation. I also explained to him the laser ablative or greenlight laser prostatectomy. I explained to him that this technology is often more effective in smaller prostates as this procedure is less ideal for large resections. In addition, I explained that the laser ablation prostatectomy  does not allow for tissue diagnosis. I also explained to him that most patients can go home  the day of surgery with the Foley catheter and return to the office in 2 days for a voiding trial.    Based on this discussion the patient has agreed to proceed with a greenlight laser prostate ablation.

## 2013-05-10 NOTE — Transfer of Care (Signed)
Immediate Anesthesia Transfer of Care Note  Patient: Jason Cross  Procedure(s) Performed: Procedure(s): GREEN LIGHT LASER TURP (TRANSURETHRAL RESECTION OF PROSTATE (N/A)  Patient Location: PACU  Anesthesia Type:General  Level of Consciousness: awake, alert  and oriented  Airway & Oxygen Therapy: Patient Spontanous Breathing and Patient connected to face mask oxygen  Post-op Assessment: Report given to PACU RN and Post -op Vital signs reviewed and stable  Post vital signs: Reviewed and stable  Complications: No apparent anesthesia complications

## 2013-05-10 NOTE — Discharge Instructions (Signed)
Foley Catheter Care, Adult A soft, flexible tube (Foley catheter) has been placed in your bladder. This may be done to temporarily help with urine drainage after an operation or to relieve blockage from an enlarged prostate gland. HOME CARE INSTRUCTIONS  If you are going home with a Foley catheter in place, follow these instructions: Taking Care of the Catheter: Keep the area where the catheter leaves your body clean. Attach the catheter to the leg so there is no tension on the catheter. Keep the drainage bag below the level of the bladder, but keep it OFF the floor. Do not take long soaking baths.  Wash your hands before touching ANYTHING related to the catheter or bag. Using mild soap and warm water on a washcloth: Clean the area closest to the catheter insertion site using a circular motion around the catheter. Clean the catheter itself by wiping AWAY from the insertion site for several inches down the tube. NEVER wipe upward as this could sweep bacteria up into the urethra (tube in your body that normally drains the bladder) and cause infection. Taking Care of the Drainage Bags: Two drainage bags will be taken home: a large overnight drainage bag, and a smaller leg bag which fits underneath clothing. It is okay to wear the overnight bag at any time, but NEVER wear the smaller leg bag at night. Keep the drainage bag well below the level of your bladder. This prevents backflow of urine into the bladder and allows the urine to drain freely. Anchor the tubing to your leg to prevent pulling or tension on the catheter. Use tape or a leg strap provided by the hospital. Empty the drainage bag when it is  to  full. Wash your hands before and after touching the bag. Periodically check the tubing for kinks to make sure there is no pressure on the tubing which could restrict the flow of urine. Changing the Drainage Bags: Cleanse both ends of the clean bag with alcohol before changing. Pinch off the  rubber catheter to avoid urine spillage during the disconnection. Disconnect the dirty bag and connect the clean one. Empty the dirty bag carefully to avoid a urine spill. Attach the new bag to the leg with tape or a leg strap. Cleaning the Drainage Bags: Whenever a drainage bag is disconnected, it must be cleaned quickly so it is ready for the next use. Wash the bag in warm, soapy water. Rinse the bag thoroughly with warm water. Soak the bag for 30 minutes in a solution of white vinegar and water (1 cup vinegar to 1 quart warm water). Rinse with warm water. SEEK MEDICAL CARE IF:  Some pain develops in the kidney (lower back) area. The urine is cloudy or smells bad. There is some blood in the urine. The catheter becomes clogged and/or there is no urine drainage. SEEK IMMEDIATE MEDICAL CARE IF:  You have moderate or severe pain in the kidney region. You start to throw up (vomit). Blood fills the tube. Worsening belly (abdominal) pain develops. You have a fever. MAKE SURE YOU:  Understand these instructions. Will watch your condition. Will get help right away if you are not doing well or get worse.  Call [facility] if you have any questions or concerns: [phone]    

## 2013-05-10 NOTE — Preoperative (Signed)
Beta Blockers   Reason not to administer Beta Blockers:Not Applicable 

## 2013-05-16 DIAGNOSIS — M48062 Spinal stenosis, lumbar region with neurogenic claudication: Secondary | ICD-10-CM | POA: Diagnosis not present

## 2013-05-16 DIAGNOSIS — R262 Difficulty in walking, not elsewhere classified: Secondary | ICD-10-CM | POA: Diagnosis not present

## 2013-05-17 DIAGNOSIS — M48062 Spinal stenosis, lumbar region with neurogenic claudication: Secondary | ICD-10-CM | POA: Diagnosis not present

## 2013-05-17 DIAGNOSIS — R262 Difficulty in walking, not elsewhere classified: Secondary | ICD-10-CM | POA: Diagnosis not present

## 2013-05-22 DIAGNOSIS — M48062 Spinal stenosis, lumbar region with neurogenic claudication: Secondary | ICD-10-CM | POA: Diagnosis not present

## 2013-05-22 DIAGNOSIS — R262 Difficulty in walking, not elsewhere classified: Secondary | ICD-10-CM | POA: Diagnosis not present

## 2013-05-24 DIAGNOSIS — M48062 Spinal stenosis, lumbar region with neurogenic claudication: Secondary | ICD-10-CM | POA: Diagnosis not present

## 2013-05-24 DIAGNOSIS — R262 Difficulty in walking, not elsewhere classified: Secondary | ICD-10-CM | POA: Diagnosis not present

## 2013-06-07 DIAGNOSIS — I10 Essential (primary) hypertension: Secondary | ICD-10-CM | POA: Diagnosis not present

## 2013-06-07 DIAGNOSIS — I519 Heart disease, unspecified: Secondary | ICD-10-CM | POA: Diagnosis not present

## 2013-06-07 DIAGNOSIS — I059 Rheumatic mitral valve disease, unspecified: Secondary | ICD-10-CM | POA: Diagnosis not present

## 2013-06-07 DIAGNOSIS — E785 Hyperlipidemia, unspecified: Secondary | ICD-10-CM | POA: Diagnosis not present

## 2013-06-22 DIAGNOSIS — R339 Retention of urine, unspecified: Secondary | ICD-10-CM | POA: Diagnosis not present

## 2013-06-23 DIAGNOSIS — R262 Difficulty in walking, not elsewhere classified: Secondary | ICD-10-CM | POA: Diagnosis not present

## 2013-06-23 DIAGNOSIS — M48062 Spinal stenosis, lumbar region with neurogenic claudication: Secondary | ICD-10-CM | POA: Diagnosis not present

## 2013-06-27 DIAGNOSIS — I10 Essential (primary) hypertension: Secondary | ICD-10-CM | POA: Diagnosis not present

## 2013-06-27 DIAGNOSIS — Z0000 Encounter for general adult medical examination without abnormal findings: Secondary | ICD-10-CM | POA: Diagnosis not present

## 2013-06-27 DIAGNOSIS — E785 Hyperlipidemia, unspecified: Secondary | ICD-10-CM | POA: Diagnosis not present

## 2013-06-27 DIAGNOSIS — N138 Other obstructive and reflux uropathy: Secondary | ICD-10-CM | POA: Diagnosis not present

## 2013-06-27 DIAGNOSIS — I2510 Atherosclerotic heart disease of native coronary artery without angina pectoris: Secondary | ICD-10-CM | POA: Diagnosis not present

## 2013-06-27 DIAGNOSIS — N401 Enlarged prostate with lower urinary tract symptoms: Secondary | ICD-10-CM | POA: Diagnosis not present

## 2013-06-28 DIAGNOSIS — R262 Difficulty in walking, not elsewhere classified: Secondary | ICD-10-CM | POA: Diagnosis not present

## 2013-06-28 DIAGNOSIS — M48062 Spinal stenosis, lumbar region with neurogenic claudication: Secondary | ICD-10-CM | POA: Diagnosis not present

## 2013-06-29 DIAGNOSIS — IMO0002 Reserved for concepts with insufficient information to code with codable children: Secondary | ICD-10-CM | POA: Diagnosis not present

## 2013-06-29 DIAGNOSIS — M4120 Other idiopathic scoliosis, site unspecified: Secondary | ICD-10-CM | POA: Diagnosis not present

## 2013-06-29 DIAGNOSIS — R269 Unspecified abnormalities of gait and mobility: Secondary | ICD-10-CM | POA: Diagnosis not present

## 2013-06-29 DIAGNOSIS — R262 Difficulty in walking, not elsewhere classified: Secondary | ICD-10-CM | POA: Diagnosis not present

## 2013-06-29 DIAGNOSIS — M545 Low back pain, unspecified: Secondary | ICD-10-CM | POA: Diagnosis not present

## 2013-06-29 DIAGNOSIS — M48062 Spinal stenosis, lumbar region with neurogenic claudication: Secondary | ICD-10-CM | POA: Diagnosis not present

## 2013-07-05 ENCOUNTER — Ambulatory Visit: Payer: Self-pay | Admitting: Specialist

## 2013-07-05 DIAGNOSIS — J984 Other disorders of lung: Secondary | ICD-10-CM | POA: Diagnosis not present

## 2013-07-06 DIAGNOSIS — M48062 Spinal stenosis, lumbar region with neurogenic claudication: Secondary | ICD-10-CM | POA: Diagnosis not present

## 2013-07-06 DIAGNOSIS — R262 Difficulty in walking, not elsewhere classified: Secondary | ICD-10-CM | POA: Diagnosis not present

## 2013-07-20 DIAGNOSIS — J449 Chronic obstructive pulmonary disease, unspecified: Secondary | ICD-10-CM | POA: Diagnosis not present

## 2013-07-20 DIAGNOSIS — R918 Other nonspecific abnormal finding of lung field: Secondary | ICD-10-CM | POA: Diagnosis not present

## 2013-07-20 DIAGNOSIS — G4733 Obstructive sleep apnea (adult) (pediatric): Secondary | ICD-10-CM | POA: Diagnosis not present

## 2013-08-31 DIAGNOSIS — R262 Difficulty in walking, not elsewhere classified: Secondary | ICD-10-CM | POA: Diagnosis not present

## 2013-08-31 DIAGNOSIS — M549 Dorsalgia, unspecified: Secondary | ICD-10-CM | POA: Diagnosis not present

## 2013-08-31 DIAGNOSIS — R42 Dizziness and giddiness: Secondary | ICD-10-CM | POA: Diagnosis not present

## 2013-08-31 DIAGNOSIS — R259 Unspecified abnormal involuntary movements: Secondary | ICD-10-CM | POA: Diagnosis not present

## 2013-10-02 DIAGNOSIS — R262 Difficulty in walking, not elsewhere classified: Secondary | ICD-10-CM | POA: Diagnosis not present

## 2013-10-02 DIAGNOSIS — R259 Unspecified abnormal involuntary movements: Secondary | ICD-10-CM | POA: Diagnosis not present

## 2013-10-02 DIAGNOSIS — M549 Dorsalgia, unspecified: Secondary | ICD-10-CM | POA: Diagnosis not present

## 2013-10-02 DIAGNOSIS — R42 Dizziness and giddiness: Secondary | ICD-10-CM | POA: Diagnosis not present

## 2013-10-04 DIAGNOSIS — E785 Hyperlipidemia, unspecified: Secondary | ICD-10-CM | POA: Diagnosis not present

## 2013-10-04 DIAGNOSIS — G4730 Sleep apnea, unspecified: Secondary | ICD-10-CM | POA: Diagnosis not present

## 2013-10-04 DIAGNOSIS — I10 Essential (primary) hypertension: Secondary | ICD-10-CM | POA: Diagnosis not present

## 2013-10-04 DIAGNOSIS — I38 Endocarditis, valve unspecified: Secondary | ICD-10-CM | POA: Diagnosis not present

## 2013-11-16 ENCOUNTER — Other Ambulatory Visit (HOSPITAL_COMMUNITY): Payer: Self-pay | Admitting: Urology

## 2013-11-16 DIAGNOSIS — N2889 Other specified disorders of kidney and ureter: Secondary | ICD-10-CM

## 2013-11-22 DIAGNOSIS — G939 Disorder of brain, unspecified: Secondary | ICD-10-CM | POA: Diagnosis not present

## 2013-11-28 ENCOUNTER — Ambulatory Visit (INDEPENDENT_AMBULATORY_CARE_PROVIDER_SITE_OTHER): Payer: Medicare Other

## 2013-11-28 ENCOUNTER — Ambulatory Visit (INDEPENDENT_AMBULATORY_CARE_PROVIDER_SITE_OTHER): Payer: Medicare Other | Admitting: Podiatry

## 2013-11-28 ENCOUNTER — Encounter: Payer: Self-pay | Admitting: Podiatry

## 2013-11-28 VITALS — BP 139/82 | HR 73 | Resp 16 | Ht 72.0 in | Wt 180.6 lb

## 2013-11-28 DIAGNOSIS — G629 Polyneuropathy, unspecified: Secondary | ICD-10-CM | POA: Diagnosis not present

## 2013-11-28 DIAGNOSIS — M779 Enthesopathy, unspecified: Secondary | ICD-10-CM

## 2013-11-28 DIAGNOSIS — M2040 Other hammer toe(s) (acquired), unspecified foot: Secondary | ICD-10-CM

## 2013-11-28 NOTE — Progress Notes (Signed)
   Subjective:    Patient ID: Jason Cross, male    DOB: March 31, 1928, 80 y.o.   MRN: [id number]  HPI Comments: Jason Cross, 80 year old male, presents the office today with bilateral foot pain. He said that his feet feel numb and "mushy" when he walks. Also states that his ankles feel like "rubber bands" and do not bend like they use to, however he has no pain associated with it. He also states that his feet feel cool, and that he has poor circulation in his feet. This all has been ongoing for about 2-3 years and is worse with standing/walking. Some of his symptoms worsened after having back surgery in January of this year. He states he has talked to his surgeon about this and he was told it will take time to recover. He does relate that the symptoms have been improving over the last year. He currently is on gabapentin 300mg  BID but does not want to increase his dose. No longer going to physical therapy. He should injury or trauma to the feet. No other complaints at this time.   Foot Pain Associated symptoms include fatigue, headaches and numbness.      Review of Systems  Constitutional: Positive for fatigue.  HENT: Positive for hearing loss.        Ringing in ears  Cardiovascular:       Calf pain  Endocrine: Positive for cold intolerance and heat intolerance.  Genitourinary: Positive for frequency.  Musculoskeletal: Positive for back pain.       Joint pain Difficulty walking Muscle pain  Skin:       Change in nails  Neurological: Positive for numbness and headaches.  Hematological: Bruises/bleeds easily.  All other systems reviewed and are negative.      Objective:   Physical Exam AAO x3, NAD DP/PT pulses palpable b/l. CRT < 3 sec. No edema Protective sensation decreased with Simms Weinstein monofilament, decreased vibratory sensation. No open lesions or pre-ulcerative lesions. Hammertoe contractures bilateral lesser digits. Mild HAV. Decrease in medial arch height upon  weightbearing. MMT 5/5, ROM WNL.  No pinpoint bony tenderness or pain with vibratory sensation.  No calf pain/swelling/warmth    Assessment & Plan:  80 year old male with peripheral neuropathy, bilateral foot pain. -Patient can reviewed the patient. No acute fractures. -Patient started on gabapentin. He is discussed increasing the dose was primary care physician however it is not one increase this time as it is likely makes it feel. -He does state he has had some improvement in symptoms of the last year. I recommend followup at his Spine surgeon. -To help alleviate some the localized symptoms in his feet prescribed compound cream for neuropathy. -Discussed supportive shoe gear to help accommodate his foot type. -Discussed importance of daily foot inspection given the neuropathy. -Followup in 6 weeks or sooner if any problems are to arise or any changes symptoms. In the meantime call the office with any questions, concerns, change in symptoms. Followup with PCP for other issues mentioned in the review of systems.

## 2013-11-29 ENCOUNTER — Encounter: Payer: Self-pay | Admitting: Podiatry

## 2013-11-29 DIAGNOSIS — I6782 Cerebral ischemia: Secondary | ICD-10-CM | POA: Diagnosis not present

## 2013-11-29 DIAGNOSIS — G939 Disorder of brain, unspecified: Secondary | ICD-10-CM | POA: Diagnosis not present

## 2013-11-29 NOTE — Patient Instructions (Signed)
See written instructions

## 2013-12-11 ENCOUNTER — Ambulatory Visit (HOSPITAL_COMMUNITY)
Admission: RE | Admit: 2013-12-11 | Discharge: 2013-12-11 | Disposition: A | Discharge status: Home / Self Care | Payer: Medicare Other | Source: Ambulatory Visit | Attending: Urology | Admitting: Urology

## 2013-12-11 DIAGNOSIS — N289 Disorder of kidney and ureter, unspecified: Secondary | ICD-10-CM | POA: Diagnosis not present

## 2013-12-11 DIAGNOSIS — D1803 Hemangioma of intra-abdominal structures: Secondary | ICD-10-CM | POA: Diagnosis not present

## 2013-12-11 DIAGNOSIS — N2889 Other specified disorders of kidney and ureter: Secondary | ICD-10-CM | POA: Insufficient documentation

## 2013-12-11 DIAGNOSIS — N281 Cyst of kidney, acquired: Secondary | ICD-10-CM | POA: Diagnosis not present

## 2013-12-11 LAB — POCT I-STAT CREATININE: CREATININE: 1.4 mg/dL — AB (ref 0.50–1.35)

## 2013-12-11 MED ORDER — GADOBENATE DIMEGLUMINE 529 MG/ML IV SOLN
16.0000 mL | Freq: Once | INTRAVENOUS | Status: AC | PRN
Start: 2013-12-11 — End: 2013-12-11
  Administered 2013-12-11: 16 mL via INTRAVENOUS

## 2013-12-27 DIAGNOSIS — R3911 Hesitancy of micturition: Secondary | ICD-10-CM | POA: Diagnosis not present

## 2013-12-27 DIAGNOSIS — Q6100 Congenital renal cyst, unspecified: Secondary | ICD-10-CM | POA: Diagnosis not present

## 2014-01-08 DIAGNOSIS — D1722 Benign lipomatous neoplasm of skin and subcutaneous tissue of left arm: Secondary | ICD-10-CM | POA: Diagnosis not present

## 2014-01-08 DIAGNOSIS — E785 Hyperlipidemia, unspecified: Secondary | ICD-10-CM | POA: Diagnosis not present

## 2014-01-08 DIAGNOSIS — I10 Essential (primary) hypertension: Secondary | ICD-10-CM | POA: Diagnosis not present

## 2014-01-09 ENCOUNTER — Ambulatory Visit: Payer: Medicare Other | Admitting: Podiatry

## 2014-01-15 ENCOUNTER — Ambulatory Visit (INDEPENDENT_AMBULATORY_CARE_PROVIDER_SITE_OTHER): Payer: Medicare Other | Admitting: General Surgery

## 2014-01-15 ENCOUNTER — Encounter: Payer: Self-pay | Admitting: General Surgery

## 2014-01-15 VITALS — BP 130/74 | HR 72 | Resp 14 | Ht 72.0 in | Wt 176.0 lb

## 2014-01-15 DIAGNOSIS — R229 Localized swelling, mass and lump, unspecified: Secondary | ICD-10-CM | POA: Diagnosis not present

## 2014-01-15 NOTE — Progress Notes (Signed)
Patient ID: Jason Cross., male   DOB: 04-Aug-1928, 80 y.o.   MRN: [id number]  Chief Complaint  Patient presents with  . Lipoma    HPI Jason Cross. is a 80 y.o. male here today for a lipoma on his left arm. Patient states he has had this lump for two to three years. It has got bigger and he states there is some pain and arm aches at times  HPI  Past Medical History  Diagnosis Date  . Hyperlipidemia     takes Simvastatin daily  . Coronary artery disease   . Peripheral neuropathy     takes Gabapentin daily  . Pneumonia     hx of in the 60's  . Dizziness     occasional  . Arthritis   . GERD (gastroesophageal reflux disease)     takes Omeprazole every other day  . Gastric ulcer     in the 60's  . History of colon polyps   . History of kidney stones     still one on the left side, low  . Cataracts, bilateral   . Hard of hearing     doesn't wear hearing aids  . Sleep apnea   . Myocardial infarction 2009  . Hypertension     stopped BP meds, BP under control  . COPD (chronic obstructive pulmonary disease)     first stages  . Coarse tremors     L>R hands  . Benign prostate hyperplasia     Past Surgical History  Procedure Laterality Date  . Neck surgery  1962  . Back surgery  2007  . Microthermo prostate  2009  . Tonsillectomy      at age 20  . Colonoscopy    . Coronary artery bypass graft  2009    LIMA to LAD, SVG to D1 07/2007  . Thoracic discectomy N/A 03/10/2013    Procedure: Thoracic twelve-Lumbar one Laminectomy;  Surgeon: Erline Levine, MD;  Location: [facility];  Service: Neurosurgery;  Laterality: N/A;  Thoracic twelve-Lumbar one Laminectomy  . Eye surgery Bilateral 2004, 2005    implants  . Green light laser turp (transurethral resection of prostate N/A 05/10/2013    Procedure: GREEN LIGHT LASER TURP (TRANSURETHRAL RESECTION OF PROSTATE;  Surgeon: Ardis Hughs, MD;  Location: [facility];  Service: Urology;  Laterality: N/A;  . Prostate surgery   10/14  . Lipoma removed      chest wall    History reviewed. No pertinent family history.  Social History History  Substance Use Topics  . Smoking status: Former Smoker -- 40 years    Types: Cigarettes    Quit date: 02/23/1998  . Smokeless tobacco: Never Used  . Alcohol Use: No    Allergies  Allergen Reactions  . Contrast Media [Iodinated Diagnostic Agents] Hives, Itching and Rash  . Codeine Nausea And Vomiting    Current Outpatient Prescriptions  Medication Sig Dispense Refill  . aspirin EC 81 MG tablet Take 81 mg by mouth every morning.    . gabapentin (NEURONTIN) 300 MG capsule Take 1 capsule (300 mg total) by mouth 2 (two) times daily.    Marland Kitchen ibuprofen (ADVIL,MOTRIN) 200 MG tablet Take 400 mg by mouth every 6 (six) hours as needed for moderate pain.    Marland Kitchen omeprazole (PRILOSEC OTC) 20 MG tablet Take 10 mg by mouth every other day.    . simvastatin (ZOCOR) 40 MG tablet Take by mouth.     No current  facility-administered medications for this visit.    Review of Systems Review of Systems  Constitutional: Negative.   Respiratory: Negative.   Cardiovascular: Negative.     Blood pressure 130/74, pulse 72, resp. rate 14, height 6' (1.829 m), weight 176 lb (79.833 kg).  Physical Exam Physical Exam  Constitutional: He is oriented to person, place, and time. He appears well-developed and well-nourished.  Eyes: Conjunctivae are normal. No scleral icterus.  Neck: Neck supple.  Cardiovascular: Normal rate, regular rhythm and normal heart sounds.   Pulmonary/Chest: Effort normal and breath sounds normal.  Lymphadenopathy:    He has no cervical adenopathy.    He has no axillary adenopathy.  Neurological: He is alert and oriented to person, place, and time.  Skin: Skin is warm and dry.       Data Reviewed Notes reviewed  Assessment    Mass in left upper arm, not sure if it is a cyst or a cutaneous lipoma. Symptomatic.    Plan   Discussed removal of skin mass.  Explained in full and he is agreeable. It is still possible his pain may be from other reasons. Pt is agreeable.        Jason Cross 01/15/2014, 9:46 AM

## 2014-01-15 NOTE — Patient Instructions (Signed)
Patient to return for a excision skin mass left arm.

## 2014-01-24 ENCOUNTER — Ambulatory Visit (INDEPENDENT_AMBULATORY_CARE_PROVIDER_SITE_OTHER): Payer: Medicare Other | Admitting: General Surgery

## 2014-01-24 ENCOUNTER — Encounter: Payer: Self-pay | Admitting: General Surgery

## 2014-01-24 VITALS — BP 130/80 | HR 84 | Resp 14 | Ht 72.0 in | Wt 180.0 lb

## 2014-01-24 DIAGNOSIS — D1722 Benign lipomatous neoplasm of skin and subcutaneous tissue of left arm: Secondary | ICD-10-CM | POA: Diagnosis not present

## 2014-01-24 DIAGNOSIS — R229 Localized swelling, mass and lump, unspecified: Secondary | ICD-10-CM

## 2014-01-24 NOTE — Progress Notes (Signed)
Here today for excision left arm lesion.  Procedure: excision mass left upper arm Anesthetic: 66ml of 1% xylocaine mixed with 0.5% marcaine Prep: chloro Prep  After prep and draping, vertical elliptical incision made over the palpable 2 cm mass.  The mass was a lipoma. Excised out in full. Blleding points cauterized. Deeper tissue closed with 3-0 Vicryl and skin with interrupted 4-0 Nylon. Dressed with Neosporin, telfa and tegaderm. No immediate problems from procedure. Pt tolerated procedure with minimal discomfort.    Follow up in 10 days for suture removal.

## 2014-01-24 NOTE — Patient Instructions (Addendum)
Keep area clean 10 day follow up for suture removal.

## 2014-01-30 LAB — PATHOLOGY

## 2014-01-31 ENCOUNTER — Telehealth: Payer: Self-pay | Admitting: *Deleted

## 2014-01-31 NOTE — Telephone Encounter (Signed)
Notified patient as instructed, patient pleased. Discussed follow-up appointments, patient agrees  

## 2014-01-31 NOTE — Telephone Encounter (Signed)
-----   Message from Christene Lye, MD sent at 01/31/2014  3:15 PM EST ----- Please let pt pt know the pathology was normal.

## 2014-02-02 ENCOUNTER — Ambulatory Visit (INDEPENDENT_AMBULATORY_CARE_PROVIDER_SITE_OTHER): Payer: Self-pay | Admitting: *Deleted

## 2014-02-02 DIAGNOSIS — R229 Localized swelling, mass and lump, unspecified: Secondary | ICD-10-CM

## 2014-02-02 NOTE — Patient Instructions (Signed)
The patient is aware to call back for any questions or concerns.  

## 2014-02-02 NOTE — Progress Notes (Signed)
Patient came in today for a wound check.  The wound is clean, with no signs of infection noted. Sutures were removed and steri strips applied. Patient was notified of pathology results. Follow up as scheduled.

## 2014-03-15 DIAGNOSIS — I10 Essential (primary) hypertension: Secondary | ICD-10-CM | POA: Diagnosis not present

## 2014-03-15 DIAGNOSIS — I25708 Atherosclerosis of coronary artery bypass graft(s), unspecified, with other forms of angina pectoris: Secondary | ICD-10-CM | POA: Diagnosis not present

## 2014-03-15 DIAGNOSIS — R0782 Intercostal pain: Secondary | ICD-10-CM | POA: Diagnosis not present

## 2014-03-15 DIAGNOSIS — R0789 Other chest pain: Secondary | ICD-10-CM | POA: Diagnosis not present

## 2014-03-15 DIAGNOSIS — E782 Mixed hyperlipidemia: Secondary | ICD-10-CM | POA: Diagnosis not present

## 2014-04-10 DIAGNOSIS — Z961 Presence of intraocular lens: Secondary | ICD-10-CM | POA: Diagnosis not present

## 2014-04-12 DIAGNOSIS — I25709 Atherosclerosis of coronary artery bypass graft(s), unspecified, with unspecified angina pectoris: Secondary | ICD-10-CM | POA: Diagnosis not present

## 2014-04-12 DIAGNOSIS — I10 Essential (primary) hypertension: Secondary | ICD-10-CM | POA: Diagnosis not present

## 2014-04-12 DIAGNOSIS — E782 Mixed hyperlipidemia: Secondary | ICD-10-CM | POA: Diagnosis not present

## 2014-04-12 DIAGNOSIS — G4733 Obstructive sleep apnea (adult) (pediatric): Secondary | ICD-10-CM | POA: Diagnosis not present

## 2014-07-04 DIAGNOSIS — E785 Hyperlipidemia, unspecified: Secondary | ICD-10-CM | POA: Diagnosis not present

## 2014-07-04 DIAGNOSIS — N183 Chronic kidney disease, stage 3 (moderate): Secondary | ICD-10-CM | POA: Diagnosis not present

## 2014-07-04 DIAGNOSIS — Z23 Encounter for immunization: Secondary | ICD-10-CM | POA: Diagnosis not present

## 2014-07-04 DIAGNOSIS — I10 Essential (primary) hypertension: Secondary | ICD-10-CM | POA: Diagnosis not present

## 2014-07-04 DIAGNOSIS — R001 Bradycardia, unspecified: Secondary | ICD-10-CM | POA: Diagnosis not present

## 2014-07-04 DIAGNOSIS — I2510 Atherosclerotic heart disease of native coronary artery without angina pectoris: Secondary | ICD-10-CM | POA: Diagnosis not present

## 2014-07-04 DIAGNOSIS — G64 Other disorders of peripheral nervous system: Secondary | ICD-10-CM | POA: Diagnosis not present

## 2014-07-04 DIAGNOSIS — Z0000 Encounter for general adult medical examination without abnormal findings: Secondary | ICD-10-CM | POA: Diagnosis not present

## 2014-07-12 DIAGNOSIS — R790 Abnormal level of blood mineral: Secondary | ICD-10-CM | POA: Diagnosis not present

## 2014-09-16 IMAGING — CT CT CHEST W/ CM
4 of 5 series · 11 of 46 positions shown, 17 images · IV contrast (omnipaque)
Comparison: Brain MR 12/13/2012. Chest radiograph 02/20/2005.
Cervical and lumbar spine MRIs of [DATE].

CLINICAL DATA: Brain lesion. Evaluate for metastasis. Weight loss.
History of renal stones.

EXAM:
CT CHEST, ABDOMEN, AND PELVIS WITH CONTRAST
TECHNIQUE: Multidetector CT imaging of the chest, abdomen and pelvis was
performed following the standard protocol during bolus
administration of intravenous contrast.
CONTRAST:  100mL OMNIPAQUE IOHEXOL 300 MG/ML  SOLN

[Series 2: chest/abd/pelvis · axial · 0.78mm/px · z∈[-574,-344]mm · 4 of 131 slices shown]
[im 16/131  soft-tissue]
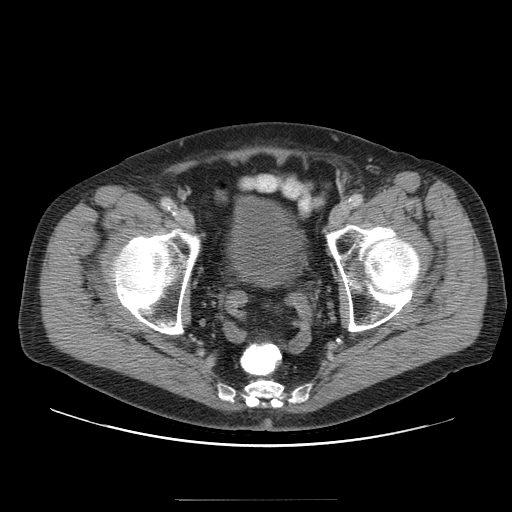
[im 31/131  soft-tissue]
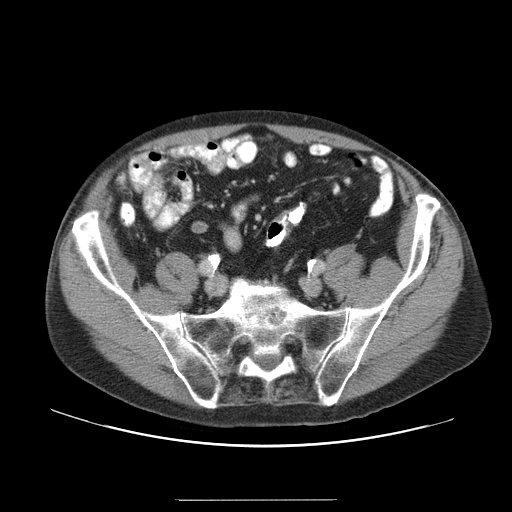
[im 46/131  soft-tissue]
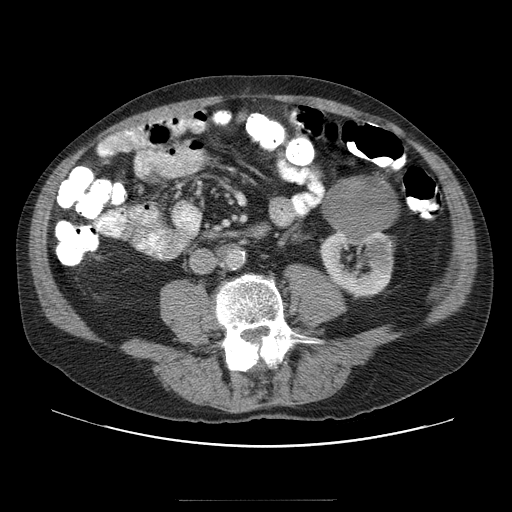
[im 62/131  soft-tissue]
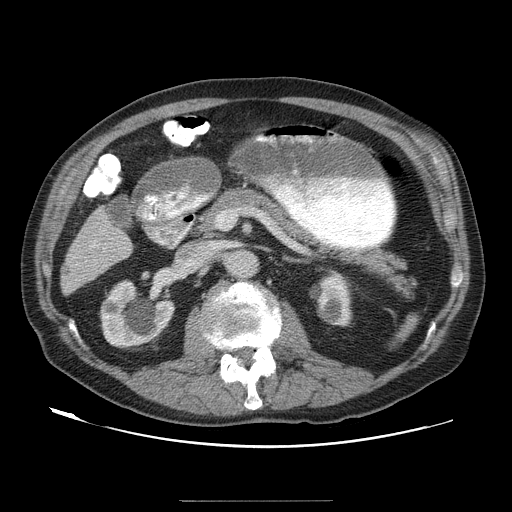

[Series 5: renal delays · axial · 0.78mm/px · z∈[-429,-349]mm · 3 of 33 slices shown, 7 images]
[im 9/33  soft-tissue]
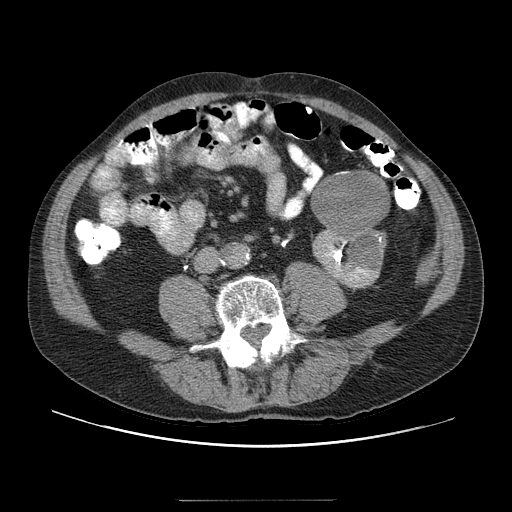
[im 9/33  lung]
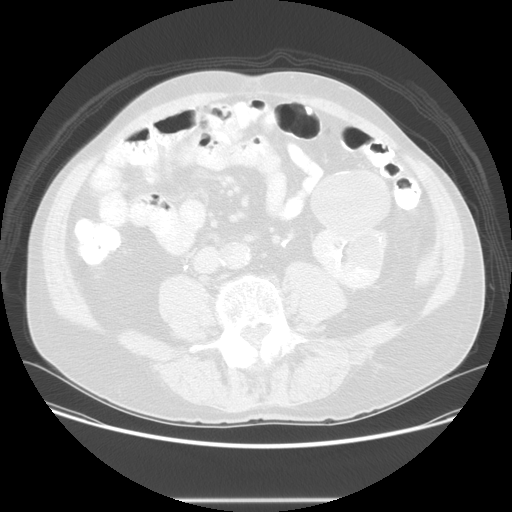
[im 9/33  bone]
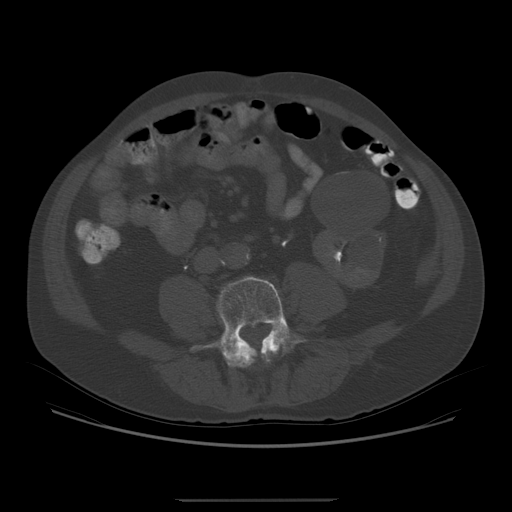
[im 17/33  soft-tissue]
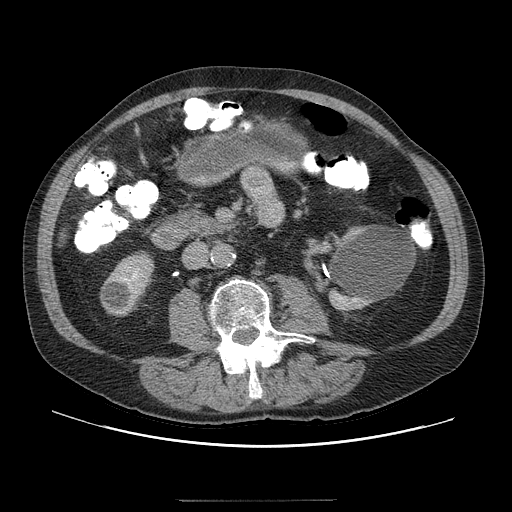
[im 17/33  lung]
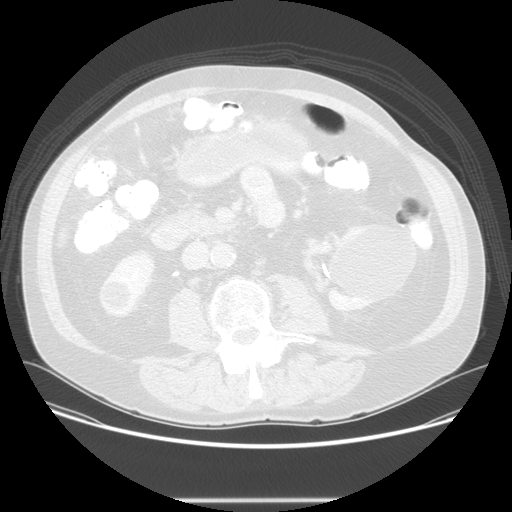
[im 25/33  soft-tissue]
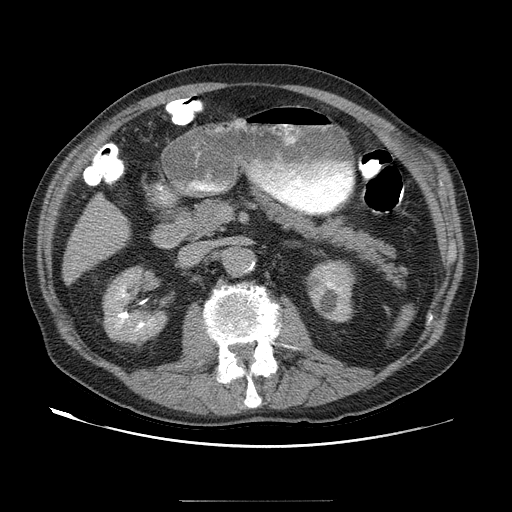
[im 25/33  lung]
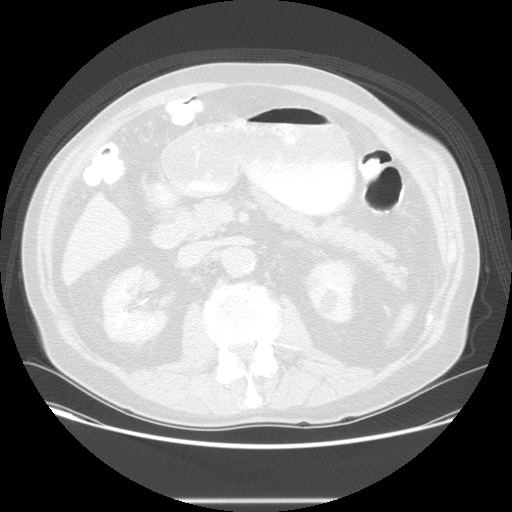

[Series 400: coronal · coronal · 1.34mm/px · 3 of 129 slices shown, 4 images]
[im 43/129  soft-tissue]
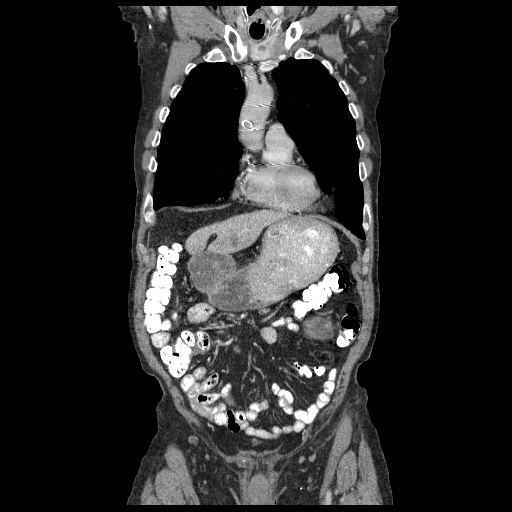
[im 57/129  soft-tissue]
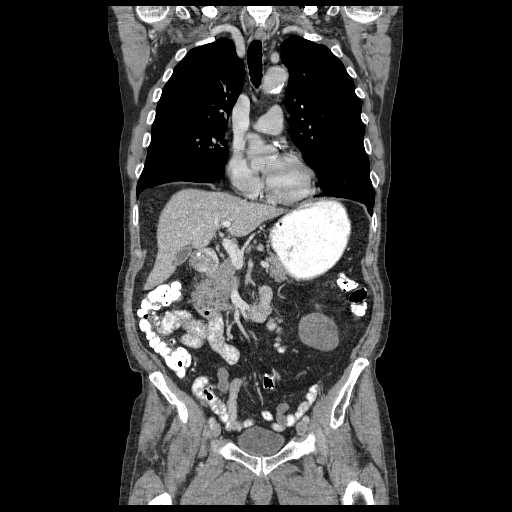
[im 57/129  bone]
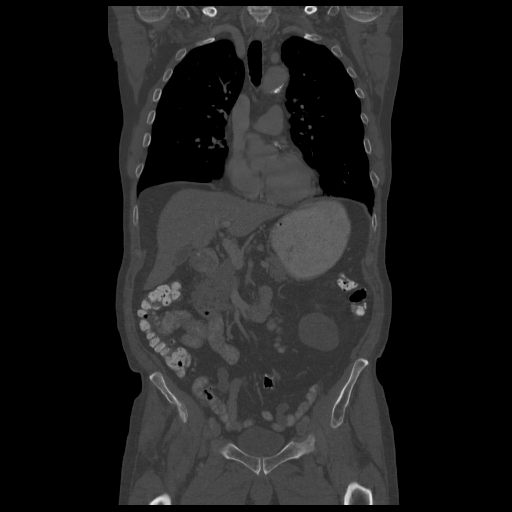
[im 72/129  soft-tissue]
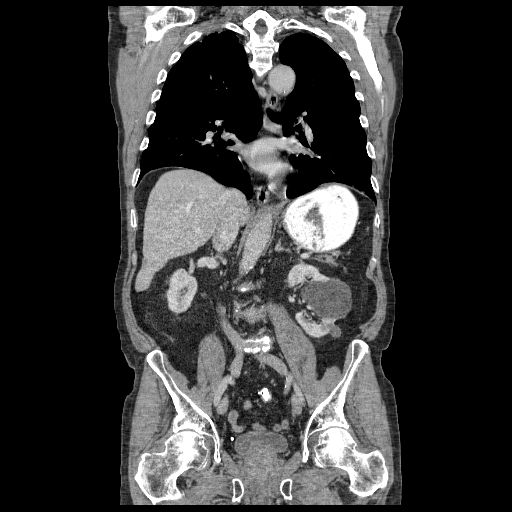

[Series 401: sagittal · sagittal · 1.34mm/px · 1 of 161 slices shown, 2 images]
[im 54/161  soft-tissue]
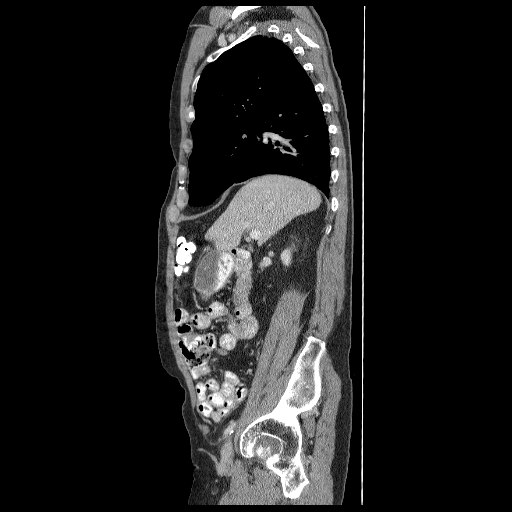
[im 54/161  bone]
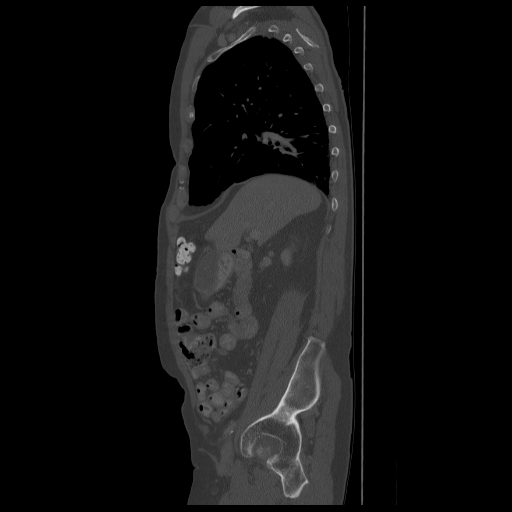

[11 of 46 positions shown; findings below may reference images not displayed]

FINDINGS: CT CHEST FINDINGS

Lungs/Pleura: Mild centrilobular emphysema.

Minimal right lower lobe nodularity at 5 mm on image 41/series 3.

Minimal subpleural nodularity at the left lower lobe on image 48/
series 3. 4 mm.

No pleural fluid.

Heart/Mediastinum: No supraclavicular adenopathy.

Enlargement of the left lobe of the thyroid with multiple bilateral
nodules. A 1.7 cm left-sided nodule on image 13/series 2.

Prior median sternotomy. Ascending aorta upper normal in size ;
cm. Normal heart size, without pericardial effusion. No central
pulmonary embolism, on this non-dedicated study. No mediastinal or
hilar adenopathy.

CT ABDOMEN AND PELVIS FINDINGS

Abdomen/Pelvis: Possible too small to characterize right liver lobe
lesion on image 61. Right liver lobe too small to characterize
lesions on images 70 and 77/series 2.

Left hepatic lobe 1.6 cm lesion on image 64 demonstrates a
suggestion of peripheral nodular enhancement and may represent a
hemangioma.

Normal spleen, stomach, pancreas, gallbladder, biliary tract,
adrenal glands.

Upper pole right renal lesion of 1.8 cm which measures slightly
greater than fluid density on image 68/series 2.

Bilateral simple renal cysts.

An upper pole left-sided renal lesion which measures 1.3 cm and
greater than fluid density on image 70.

A multi septated cystic process with calcification (versus adjacent
more simple cysts complicated by prior hemorrhage) is identified in
the lower pole left kidney. This measures on the order of 8.7 x
cm on image 89/series 2. Cystic components greater than fluid
density, measuring on the order of 38 HU.

Left renal vein patent. Advanced aortic atherosclerosis. No
retroperitoneal or retrocrural adenopathy.

Normal colon, appendix, and terminal ileum. Normal small bowel
without abdominal ascites.

No pelvic adenopathy. Bilateral fat containing inguinal hernias.
Normal urinary bladder. Moderate prostatomegaly. No significant free
fluid.

Bones/Musculoskeletal: Mild osteopenia. Right ischial sclerotic
lesion on image 126/ series 2 at 1.0 cm. Indeterminate. Vague
sclerosis in the right iliac on image 98/series 2. No typical
findings of osseous metastasis or multiple myeloma.
IMPRESSION: CT CHEST IMPRESSION

1. No convincing evidence of primary malignancy or metastatic
disease within the chest.
2. Left-sided thyroid enlargement with bilateral thyroid nodules.
Consider further characterization with thyroid ultrasound.
3. Minimal nodularity at the lung bases, of doubtful clinical
significance.

CT ABDOMEN AND PELVIS IMPRESSION

1. Indeterminate bilateral renal lesions, including a multi cystic
complex lower pole left-sided lesion. Consider definitive
characterization with pre and post contrast abdominal MRI.
2. Liver lesions which are also indeterminate. A left-sided liver
lesion may represent a hemangioma. These would also be better
evaluated with MRI.
3. No other evidence of primary malignancy within the abdomen or
pelvis. No typical findings of osseous metastasis or multiple
myeloma. Scattered indeterminate sclerotic foci are seen.
4. Prostatomegaly with bilateral small inguinal hernias.

## 2014-10-11 DIAGNOSIS — R42 Dizziness and giddiness: Secondary | ICD-10-CM | POA: Diagnosis not present

## 2014-10-11 DIAGNOSIS — I429 Cardiomyopathy, unspecified: Secondary | ICD-10-CM | POA: Diagnosis not present

## 2014-10-11 DIAGNOSIS — I38 Endocarditis, valve unspecified: Secondary | ICD-10-CM | POA: Diagnosis not present

## 2014-10-11 DIAGNOSIS — I25810 Atherosclerosis of coronary artery bypass graft(s) without angina pectoris: Secondary | ICD-10-CM | POA: Diagnosis not present

## 2014-12-06 IMAGING — DX DG LUMBAR SPINE 2-3V
1 series · 1 of 1 positions shown · non-contrast
Comparison: MRI lumbar spine 12/12/2012.

CLINICAL DATA: L3-5 decompression.  T12-L1 laminectomy.

EXAM:
LUMBAR SPINE - 2-3 VIEW

[lat]
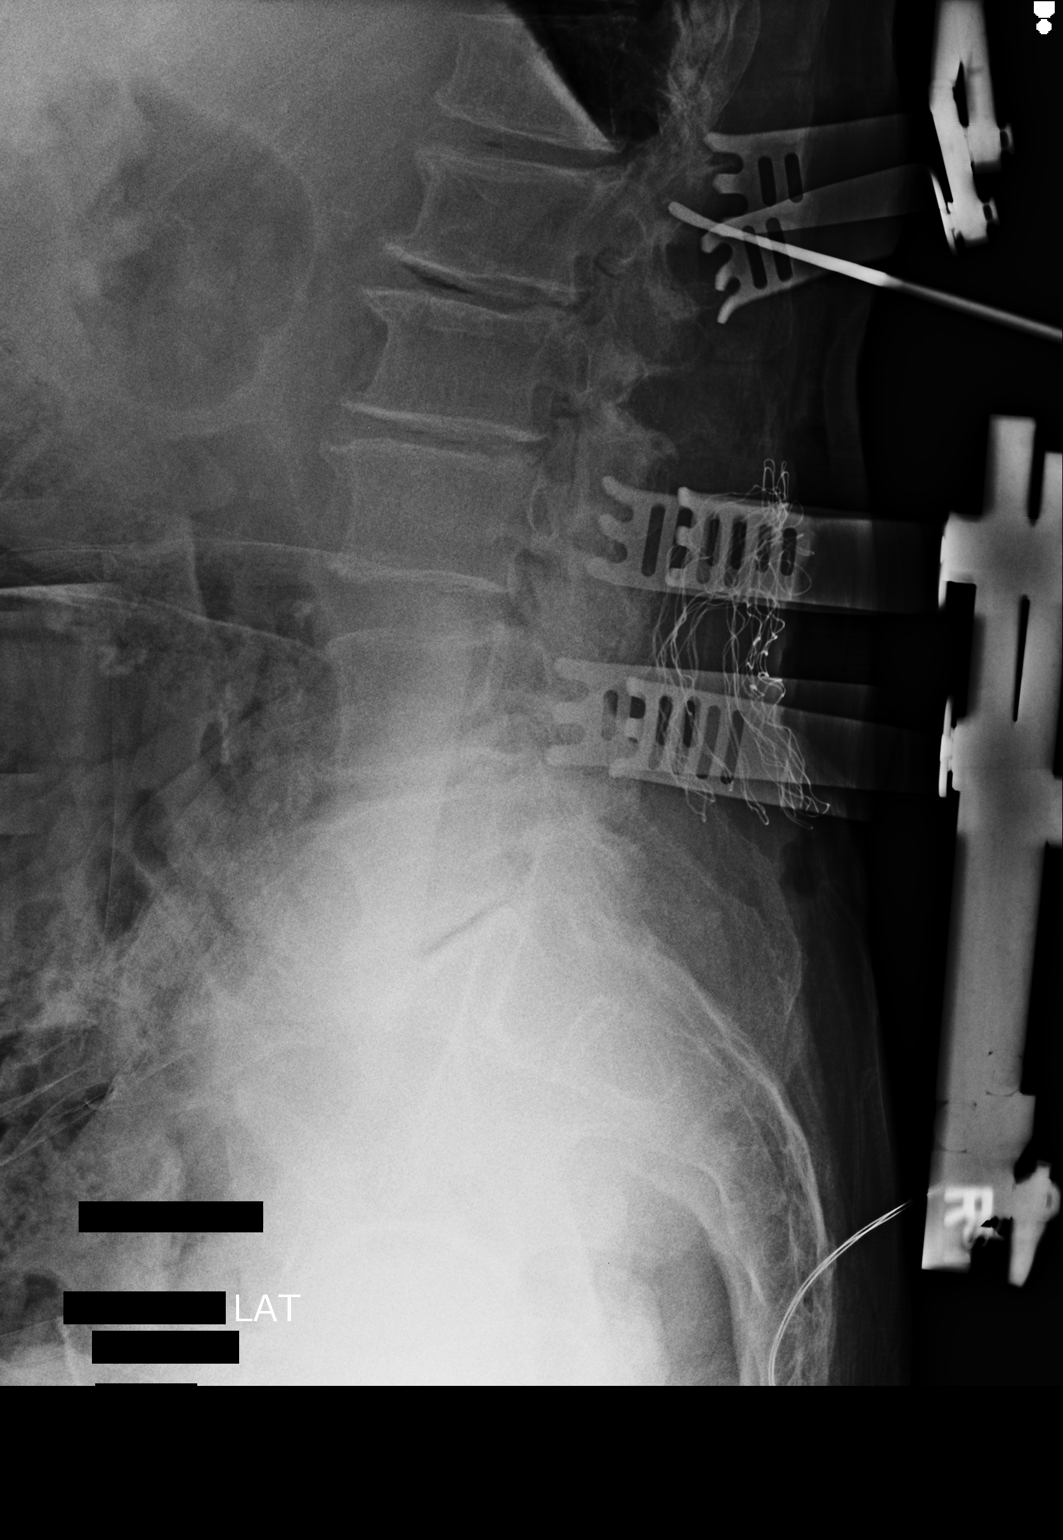

[1 of 1 positions shown; findings below may reference images not displayed]

FINDINGS: We are provided with 2 intraoperative views of the lumbar spine in
the lateral projection. On film labeled number 1, the superior most
probe is at the level of the T12-L1 foramina. Probes are also seen
at the level of the L3, L4 and L5 pedicles. On the second image, a
probe is at the level of the superior endplate of L1. The other
probes have been removed.
IMPRESSION: Localization as above.

## 2014-12-10 ENCOUNTER — Other Ambulatory Visit: Payer: Self-pay | Admitting: Urology

## 2014-12-10 DIAGNOSIS — N2889 Other specified disorders of kidney and ureter: Secondary | ICD-10-CM

## 2014-12-19 ENCOUNTER — Other Ambulatory Visit (HOSPITAL_COMMUNITY): Payer: Medicare Other

## 2014-12-26 ENCOUNTER — Ambulatory Visit (HOSPITAL_COMMUNITY)
Admission: RE | Admit: 2014-12-26 | Discharge: 2014-12-26 | Disposition: A | Discharge status: Home / Self Care | Payer: Medicare Other | Source: Ambulatory Visit | Attending: Urology | Admitting: Urology

## 2014-12-26 DIAGNOSIS — D1803 Hemangioma of intra-abdominal structures: Secondary | ICD-10-CM | POA: Diagnosis not present

## 2014-12-26 DIAGNOSIS — N2889 Other specified disorders of kidney and ureter: Secondary | ICD-10-CM | POA: Diagnosis present

## 2014-12-26 DIAGNOSIS — N289 Disorder of kidney and ureter, unspecified: Secondary | ICD-10-CM | POA: Insufficient documentation

## 2014-12-26 DIAGNOSIS — N281 Cyst of kidney, acquired: Secondary | ICD-10-CM | POA: Diagnosis not present

## 2014-12-26 LAB — POCT I-STAT CREATININE: CREATININE: 1.7 mg/dL — AB (ref 0.61–1.24)

## 2014-12-26 MED ORDER — GADOBENATE DIMEGLUMINE 529 MG/ML IV SOLN: 10.0000 mL | Freq: Once | INTRAVENOUS | Status: DC | PRN

## 2014-12-31 DIAGNOSIS — N281 Cyst of kidney, acquired: Secondary | ICD-10-CM | POA: Diagnosis not present

## 2015-02-09 DIAGNOSIS — J209 Acute bronchitis, unspecified: Secondary | ICD-10-CM | POA: Diagnosis not present

## 2015-03-20 ENCOUNTER — Ambulatory Visit: Payer: Medicare Other | Admitting: Unknown Physician Specialty

## 2015-03-21 ENCOUNTER — Encounter: Payer: Self-pay | Admitting: Family Medicine

## 2015-03-21 ENCOUNTER — Ambulatory Visit (INDEPENDENT_AMBULATORY_CARE_PROVIDER_SITE_OTHER): Payer: Medicare Other | Admitting: Family Medicine

## 2015-03-21 VITALS — BP 147/75 | HR 79 | Temp 97.5°F | Ht 69.1 in | Wt 178.0 lb

## 2015-03-21 DIAGNOSIS — B078 Other viral warts: Secondary | ICD-10-CM | POA: Diagnosis not present

## 2015-03-21 DIAGNOSIS — L858 Other specified epidermal thickening: Secondary | ICD-10-CM | POA: Diagnosis not present

## 2015-03-21 NOTE — Progress Notes (Signed)
BP 147/75 mmHg  Pulse 79  Temp(Src) 97.5 F (36.4 C)  Ht 5' 9.1" (1.755 m)  Wt 178 lb (80.74 kg)  BMI 26.21 kg/m2  SpO2 99%   Subjective:    Patient ID: Jason Azucena Fallen., male    DOB: [birth year], 82 y.o.   MRN: [id number]  HPI: Jason W Ladarian Tur. is a 82 y.o. male  Chief Complaint  Patient presents with  . skin tag removal    in front of left ear   SKIN LESION Duration: chronic Location: anterior L ear Painful: no Itching: no Onset: gradual Context: bigger Associated signs and symptoms: It has gotten bigger over the past couple of weeks History of skin cancer: no History of precancerous skin lesions: no Family history of skin cancer: no   Relevant past medical, surgical, family and social history reviewed and updated as indicated. Interim medical history since our last visit reviewed. Allergies and medications reviewed and updated.  Review of Systems  Constitutional: Negative.   Respiratory: Negative.   Skin: Negative.   Psychiatric/Behavioral: Negative.     Per HPI unless specifically indicated above     Objective:    BP 147/75 mmHg  Pulse 79  Temp(Src) 97.5 F (36.4 C)  Ht 5' 9.1" (1.755 m)  Wt 178 lb (80.74 kg)  BMI 26.21 kg/m2  SpO2 99%  Wt Readings from Last 3 Encounters:  03/21/15 178 lb (80.74 kg)  07/04/14 177 lb (80.287 kg)  01/24/14 180 lb (81.647 kg)    Physical Exam  Constitutional: He is oriented to person, place, and time. He appears well-developed and well-nourished. No distress.  HENT:  Head: Normocephalic and atraumatic.  Right Ear: Hearing normal.  Left Ear: Hearing normal.  Nose: Nose normal.  Eyes: Conjunctivae and lids are normal. Right eye exhibits no discharge. Left eye exhibits no discharge. No scleral icterus.  Pulmonary/Chest: Effort normal. No respiratory distress.  Musculoskeletal: Normal range of motion.  Neurological: He is alert and oriented to person, place, and time.  Skin: Skin is warm, dry and intact. No  rash noted. No erythema. No pallor.  cutaenous horn anterior L ear  Psychiatric: He has a normal mood and affect. His speech is normal and behavior is normal. Judgment and thought content normal. Cognition and memory are normal.    Results for orders placed or performed during the hospital encounter of 12/26/14  I-STAT creatinine  Result Value Ref Range   Creatinine, Ser 1.70 (H) 0.61 - 1.24 mg/dL      Assessment & Plan:   Problem List Items Addressed This Visit    None    Visit Diagnoses    Cutaneous horn    -  Primary    Removed as discussed below. Patient is concerned about it and would like it sent for pathology. Sent. Will let him know the results.     Relevant Orders    Pathology Report       Skin Procedure  Procedure: Informed consent given.  Area infiltrated with lidocaine without epinephrine.  Lesioncompletely excised and sent for pathology     Diagnosis:   ICD-9-CM ICD-10-CM   1. Cutaneous horn 702.8 L85.8 Pathology Report   Removed as discussed below. Patient is concerned about it and would like it sent for pathology. Sent. Will let him know the results.     Lesion Location/Size: Anterior L ear, 2cm x 0.3cm Physician: MJ Consent:  Risks, benefits, and alternative treatments discussed and all questions were answered.  Patient elected  to proceed and verbal consent obtained.  Description: Area prepped and draped using semi-sterile technique. Area locally anesthetized using 0.5 cc's of lidocaine 1% plain. Lesion biopsied using a #15 curved blade at the base.  Adequate hemostastis achieved using Silver Nitrate. Wound dressed after application of bacitracin ointment.  Post Procedure Instructions: Wound care instructions discussed and patient was instructed to keep area clean and dry.  Signs and symptoms of infection discussed, patient agrees to contact the office ASAP should they occur.  Dressing change recommended every other day   Follow up plan: Return if symptoms  worsen or fail to improve.

## 2015-03-25 LAB — PATHOLOGY

## 2015-03-26 ENCOUNTER — Telehealth: Payer: Self-pay | Admitting: Family Medicine

## 2015-03-26 NOTE — Telephone Encounter (Signed)
Patient notified

## 2015-03-26 NOTE — Telephone Encounter (Signed)
Please let him know that his skin lesion was nothing to worry about.

## 2015-03-30 DIAGNOSIS — J399 Disease of upper respiratory tract, unspecified: Secondary | ICD-10-CM | POA: Diagnosis not present

## 2015-05-07 ENCOUNTER — Ambulatory Visit (INDEPENDENT_AMBULATORY_CARE_PROVIDER_SITE_OTHER): Payer: Medicare Other | Admitting: Family Medicine

## 2015-05-07 ENCOUNTER — Encounter: Payer: Self-pay | Admitting: Family Medicine

## 2015-05-07 VITALS — BP 110/71 | HR 77 | Temp 97.9°F | Ht 70.0 in | Wt 176.0 lb

## 2015-05-07 DIAGNOSIS — J069 Acute upper respiratory infection, unspecified: Secondary | ICD-10-CM | POA: Diagnosis not present

## 2015-05-07 DIAGNOSIS — H6121 Impacted cerumen, right ear: Secondary | ICD-10-CM

## 2015-05-07 NOTE — Progress Notes (Signed)
   BP 110/71 mmHg  Pulse 77  Temp(Src) 97.9 F (36.6 C)  Ht 5\' 10"  (1.778 m)  Wt 176 lb (79.833 kg)  BMI 25.25 kg/m2  SpO2 99%   Subjective:    Patient ID: Jason Cross., male    DOB: 09-13-1928, 82 y.o.   MRN: [id number]  HPI: Jason Cross. is a 82 y.o. male  Chief Complaint  Patient presents with  . ears stopped up    x 2 weeks, popping   patient with head cold some congestion cough and right ear markedly stopped up left ear somewhat stopped up with decreased hearing in the right ear Patient's cough is nonproductive and cold is getting somewhat better. No fever chills   Relevant past medical, surgical, family and social history reviewed and updated as indicated. Interim medical history since our last visit reviewed. Allergies and medications reviewed and updated.  Review of Systems  Constitutional: Negative.   HENT: Positive for hearing loss.   Respiratory: Positive for cough. Negative for shortness of breath.   Cardiovascular: Negative.     Per HPI unless specifically indicated above     Objective:    BP 110/71 mmHg  Pulse 77  Temp(Src) 97.9 F (36.6 C)  Ht 5\' 10"  (1.778 m)  Wt 176 lb (79.833 kg)  BMI 25.25 kg/m2  SpO2 99%  Wt Readings from Last 3 Encounters:  05/07/15 176 lb (79.833 kg)  03/21/15 178 lb (80.74 kg)  07/04/14 177 lb (80.287 kg)    Physical Exam  Constitutional: He is oriented to person, place, and time. He appears well-developed and well-nourished. No distress.  HENT:  Head: Normocephalic and atraumatic.  Right Ear: Hearing normal.  Left Ear: Hearing and external ear normal.  Nose: Nose normal.  Right ear blocked with cerumen removed with instruments and water revealing normal canal and TM with relief of patient's symptoms  Eyes: Conjunctivae and lids are normal. Right eye exhibits no discharge. Left eye exhibits no discharge. No scleral icterus.  Pulmonary/Chest: Effort normal. No respiratory distress.  Musculoskeletal:  Normal range of motion.  Neurological: He is alert and oriented to person, place, and time.  Skin: Skin is intact. No rash noted.  Psychiatric: He has a normal mood and affect. His speech is normal and behavior is normal. Judgment and thought content normal. Cognition and memory are normal.        Assessment & Plan:   Problem List Items Addressed This Visit    None    Visit Diagnoses    Upper respiratory infection    -  Primary    Discuss URI care and treatment with over-the-counter medications Tylenol, sinus medications cough medications as needed    Cerumen impaction, right        Discussed ear care impaction and avoidance of Q-tips        Follow up plan: Return for Physical Exam  May.

## 2015-05-13 DIAGNOSIS — I25810 Atherosclerosis of coronary artery bypass graft(s) without angina pectoris: Secondary | ICD-10-CM | POA: Diagnosis not present

## 2015-05-13 DIAGNOSIS — I428 Other cardiomyopathies: Secondary | ICD-10-CM | POA: Diagnosis not present

## 2015-05-13 DIAGNOSIS — E782 Mixed hyperlipidemia: Secondary | ICD-10-CM | POA: Diagnosis not present

## 2015-05-13 DIAGNOSIS — R42 Dizziness and giddiness: Secondary | ICD-10-CM | POA: Diagnosis not present

## 2015-05-13 DIAGNOSIS — I38 Endocarditis, valve unspecified: Secondary | ICD-10-CM | POA: Diagnosis not present

## 2015-05-13 DIAGNOSIS — G4733 Obstructive sleep apnea (adult) (pediatric): Secondary | ICD-10-CM | POA: Diagnosis not present

## 2015-06-05 DIAGNOSIS — J399 Disease of upper respiratory tract, unspecified: Secondary | ICD-10-CM | POA: Diagnosis not present

## 2015-07-17 ENCOUNTER — Encounter: Payer: Self-pay | Admitting: Family Medicine

## 2015-07-17 ENCOUNTER — Ambulatory Visit (INDEPENDENT_AMBULATORY_CARE_PROVIDER_SITE_OTHER): Payer: Medicare Other | Admitting: Family Medicine

## 2015-07-17 VITALS — BP 109/74 | HR 76 | Temp 98.1°F | Ht 70.0 in | Wt 173.0 lb

## 2015-07-17 DIAGNOSIS — R5383 Other fatigue: Secondary | ICD-10-CM

## 2015-07-17 DIAGNOSIS — I2510 Atherosclerotic heart disease of native coronary artery without angina pectoris: Secondary | ICD-10-CM

## 2015-07-17 DIAGNOSIS — G629 Polyneuropathy, unspecified: Secondary | ICD-10-CM | POA: Insufficient documentation

## 2015-07-17 DIAGNOSIS — N179 Acute kidney failure, unspecified: Secondary | ICD-10-CM

## 2015-07-17 DIAGNOSIS — F0390 Unspecified dementia without behavioral disturbance: Secondary | ICD-10-CM

## 2015-07-17 DIAGNOSIS — I951 Orthostatic hypotension: Secondary | ICD-10-CM

## 2015-07-17 DIAGNOSIS — Z0000 Encounter for general adult medical examination without abnormal findings: Secondary | ICD-10-CM

## 2015-07-17 DIAGNOSIS — F0281 Dementia in other diseases classified elsewhere with behavioral disturbance: Secondary | ICD-10-CM | POA: Insufficient documentation

## 2015-07-17 DIAGNOSIS — G20 Parkinson's disease: Secondary | ICD-10-CM | POA: Insufficient documentation

## 2015-07-17 DIAGNOSIS — F0280 Dementia in other diseases classified elsewhere without behavioral disturbance: Secondary | ICD-10-CM | POA: Insufficient documentation

## 2015-07-17 DIAGNOSIS — G6289 Other specified polyneuropathies: Secondary | ICD-10-CM | POA: Diagnosis not present

## 2015-07-17 DIAGNOSIS — I25810 Atherosclerosis of coronary artery bypass graft(s) without angina pectoris: Secondary | ICD-10-CM | POA: Insufficient documentation

## 2015-07-17 DIAGNOSIS — I10 Essential (primary) hypertension: Secondary | ICD-10-CM | POA: Insufficient documentation

## 2015-07-17 DIAGNOSIS — F02818 Dementia in other diseases classified elsewhere, unspecified severity, with other behavioral disturbance: Secondary | ICD-10-CM | POA: Insufficient documentation

## 2015-07-17 LAB — URINALYSIS, ROUTINE W REFLEX MICROSCOPIC
BILIRUBIN UA: NEGATIVE
Glucose, UA: NEGATIVE
Ketones, UA: NEGATIVE
Leukocytes, UA: NEGATIVE
Nitrite, UA: NEGATIVE
PH UA: 5 (ref 5.0–7.5)
PROTEIN UA: NEGATIVE
RBC, UA: NEGATIVE
Specific Gravity, UA: 1.01 (ref 1.005–1.030)
UUROB: 0.2 mg/dL (ref 0.2–1.0)

## 2015-07-17 MED ORDER — DONEPEZIL HCL 5 MG PO TBDP: 5.0000 mg | ORAL_TABLET | Freq: Every day | ORAL | Status: DC

## 2015-07-17 NOTE — Assessment & Plan Note (Signed)
Discussed orthostatic hypotension and same as per neurology note from 2014 and cardiology note will discontinue hydrochlorothiazide observe blood pressure if getting too high and may use low-dose hydrochlorothiazide otherwise will observe patient will increase salt and fluids

## 2015-07-17 NOTE — Assessment & Plan Note (Signed)
Mini-Mental Status score of 28 reviewed report with patient and wife will begin Aricept 5 mg

## 2015-07-17 NOTE — Progress Notes (Signed)
BP 109/74 mmHg  Pulse 76  Temp(Src) 98.1 F (36.7 C)  Ht 5\' 10"  (1.778 m)  Wt 173 lb (78.472 kg)  BMI 24.82 kg/m2  SpO2 95%   Subjective:    Patient ID: Jason Cross., male    DOB: 17-Nov-1928, 82 y.o.   MRN: [id number]  HPI: Jason W Muse Brincefield. is a 82 y.o. male  Chief Complaint  Patient presents with  . Annual Exam  Patient with concerns about right flank pain is been ongoing off and on for 4 years seemed to develop worse after lifting a heavy battery with his right hand and up high into a boat. This pain comes on especially with be in active goes away with rest no noticed change with diet bowels activity other than above no blood in stool or urine  also noticed some orthostatic hypotension changes consistently from stooping position to standing.  Patient accompanied by wife who assists with history Both patient and wife have noticed increasing forgetfulness  Relevant past medical, surgical, family and social history reviewed and updated as indicated. Interim medical history since our last visit reviewed. Allergies and medications reviewed and updated.  Review of Systems  Constitutional: Negative.   HENT: Negative.   Eyes: Negative.   Respiratory: Negative.   Cardiovascular: Negative.   Gastrointestinal: Negative.   Endocrine: Negative.   Genitourinary: Negative.   Musculoskeletal: Negative.   Skin: Negative.   Allergic/Immunologic: Negative.   Neurological: Negative.   Hematological: Negative.   Psychiatric/Behavioral: Negative.     Per HPI unless specifically indicated above     Objective:    BP 109/74 mmHg  Pulse 76  Temp(Src) 98.1 F (36.7 C)  Ht 5\' 10"  (1.778 m)  Wt 173 lb (78.472 kg)  BMI 24.82 kg/m2  SpO2 95%  Wt Readings from Last 3 Encounters:  07/17/15 173 lb (78.472 kg)  05/07/15 176 lb (79.833 kg)  03/21/15 178 lb (80.74 kg)    Physical Exam  Constitutional: He is oriented to person, place, and time. He appears well-developed  and well-nourished.  HENT:  Head: Normocephalic and atraumatic.  Right Ear: External ear normal.  Left Ear: External ear normal.  Eyes: Conjunctivae and EOM are normal. Pupils are equal, round, and reactive to light.  Neck: Normal range of motion. Neck supple.  Cardiovascular: Normal rate, regular rhythm, normal heart sounds and intact distal pulses.   Pulmonary/Chest: Effort normal and breath sounds normal.  Abdominal: Soft. Bowel sounds are normal. There is no splenomegaly or hepatomegaly.  Genitourinary: Rectum normal, prostate normal and penis normal.  Musculoskeletal: Normal range of motion.  Neurological: He is alert and oriented to person, place, and time. He has normal reflexes.  Skin: No rash noted. No erythema.  Psychiatric: He has a normal mood and affect. His behavior is normal. Judgment and thought content normal.        Assessment & Plan:   Problem List Items Addressed This Visit      Cardiovascular and Mediastinum   Orthostatic hypotension    Discussed orthostatic hypotension and same as per neurology note from 2014 and cardiology note will discontinue hydrochlorothiazide observe blood pressure if getting too high and may use low-dose hydrochlorothiazide otherwise will observe patient will increase salt and fluids      Hypertension   Coronary artery disease     Nervous and Auditory   Peripheral neuropathy (HCC)   Relevant Medications   donepezil (ARICEPT ODT) 5 MG disintegrating tablet   Dementia  Mini-Mental Status score of 28 reviewed report with patient and wife will begin Aricept 5 mg       Relevant Medications   donepezil (ARICEPT ODT) 5 MG disintegrating tablet    Other Visit Diagnoses    Routine general medical examination at a health care facility    -  Primary    Relevant Orders    CBC with Differential/Platelet    Comprehensive metabolic panel    Lipid Panel w/o Chol/HDL Ratio    TSH    Urinalysis, Routine w reflex microscopic (not at [facility])  (Completed)    Acute kidney failure, unspecified ([place])        Relevant Orders    CBC with Differential/Platelet    Other fatigue        Relevant Orders    TSH        Follow up plan: Return in about 3 months (around 10/17/2015), or if symptoms worsen or fail to improve, for Med check.

## 2015-07-18 ENCOUNTER — Encounter: Payer: Self-pay | Admitting: Family Medicine

## 2015-07-18 LAB — COMPREHENSIVE METABOLIC PANEL
ALBUMIN: 4.3 g/dL (ref 3.5–4.7)
ALT: 16 IU/L (ref 0–44)
AST: 22 IU/L (ref 0–40)
Albumin/Globulin Ratio: 1.9 (ref 1.2–2.2)
Alkaline Phosphatase: 86 IU/L (ref 39–117)
BUN/Creatinine Ratio: 15 (ref 10–24)
BUN: 26 mg/dL (ref 8–27)
Bilirubin Total: 1.3 mg/dL — ABNORMAL HIGH (ref 0.0–1.2)
CO2: 23 mmol/L (ref 18–29)
Calcium: 9.2 mg/dL (ref 8.6–10.2)
Chloride: 100 mmol/L (ref 96–106)
Creatinine, Ser: 1.68 mg/dL — ABNORMAL HIGH (ref 0.76–1.27)
GFR calc Af Amer: 42 mL/min/{1.73_m2} — ABNORMAL LOW (ref >59)
GFR calc non Af Amer: 37 mL/min/{1.73_m2} — ABNORMAL LOW (ref >59)
Globulin, Total: 2.3 g/dL (ref 1.5–4.5)
Glucose: 84 mg/dL (ref 65–99)
Potassium: 4.5 mmol/L (ref 3.5–5.2)
Sodium: 139 mmol/L (ref 134–144)
Total Protein: 6.6 g/dL (ref 6.0–8.5)

## 2015-07-18 LAB — CBC WITH DIFFERENTIAL/PLATELET
BASOS: 1 %
Basophils Absolute: 0.1 10*3/uL (ref 0.0–0.2)
EOS (ABSOLUTE): 0.1 10*3/uL (ref 0.0–0.4)
Eos: 2 %
HEMATOCRIT: 41.5 % (ref 37.5–51.0)
HEMOGLOBIN: 14.1 g/dL (ref 12.6–17.7)
Immature Grans (Abs): 0 10*3/uL (ref 0.0–0.1)
Immature Granulocytes: 0 %
LYMPHS ABS: 1.7 10*3/uL (ref 0.7–3.1)
Lymphs: 27 %
MCH: 29.9 pg (ref 26.6–33.0)
MCHC: 34 g/dL (ref 31.5–35.7)
MCV: 88 fL (ref 79–97)
MONOS ABS: 0.6 10*3/uL (ref 0.1–0.9)
Monocytes: 10 %
Neutrophils Absolute: 3.7 10*3/uL (ref 1.4–7.0)
Neutrophils: 60 %
Platelets: 258 10*3/uL (ref 150–379)
RBC: 4.72 x10E6/uL (ref 4.14–5.80)
RDW: 14 % (ref 12.3–15.4)
WBC: 6.2 10*3/uL (ref 3.4–10.8)

## 2015-07-18 LAB — TSH: TSH: 0.813 u[IU]/mL (ref 0.450–4.500)

## 2015-07-18 LAB — LIPID PANEL W/O CHOL/HDL RATIO
Cholesterol, Total: 131 mg/dL (ref 100–199)
HDL: 37 mg/dL — ABNORMAL LOW (ref >39)
LDL CALC: 71 mg/dL (ref 0–99)
TRIGLYCERIDES: 113 mg/dL (ref 0–149)
VLDL Cholesterol Cal: 23 mg/dL (ref 5–40)

## 2015-11-13 DIAGNOSIS — G4733 Obstructive sleep apnea (adult) (pediatric): Secondary | ICD-10-CM | POA: Diagnosis not present

## 2015-11-13 DIAGNOSIS — I38 Endocarditis, valve unspecified: Secondary | ICD-10-CM | POA: Diagnosis not present

## 2015-11-13 DIAGNOSIS — E782 Mixed hyperlipidemia: Secondary | ICD-10-CM | POA: Diagnosis not present

## 2015-11-13 DIAGNOSIS — I25810 Atherosclerosis of coronary artery bypass graft(s) without angina pectoris: Secondary | ICD-10-CM | POA: Diagnosis not present

## 2015-11-13 DIAGNOSIS — R42 Dizziness and giddiness: Secondary | ICD-10-CM | POA: Diagnosis not present

## 2015-11-13 DIAGNOSIS — R0782 Intercostal pain: Secondary | ICD-10-CM | POA: Diagnosis not present

## 2015-11-13 DIAGNOSIS — I428 Other cardiomyopathies: Secondary | ICD-10-CM | POA: Diagnosis not present

## 2015-12-30 ENCOUNTER — Encounter: Payer: Self-pay | Admitting: Family Medicine

## 2015-12-30 ENCOUNTER — Ambulatory Visit (INDEPENDENT_AMBULATORY_CARE_PROVIDER_SITE_OTHER): Payer: Medicare Other | Admitting: Family Medicine

## 2015-12-30 VITALS — BP 177/96 | HR 74 | Temp 98.0°F | Wt 175.4 lb

## 2015-12-30 DIAGNOSIS — K219 Gastro-esophageal reflux disease without esophagitis: Secondary | ICD-10-CM | POA: Diagnosis not present

## 2015-12-30 DIAGNOSIS — Z23 Encounter for immunization: Secondary | ICD-10-CM

## 2015-12-30 DIAGNOSIS — I10 Essential (primary) hypertension: Secondary | ICD-10-CM | POA: Diagnosis not present

## 2015-12-30 DIAGNOSIS — I2510 Atherosclerotic heart disease of native coronary artery without angina pectoris: Secondary | ICD-10-CM | POA: Diagnosis not present

## 2015-12-30 NOTE — Assessment & Plan Note (Signed)
Stable with nonspecific chest symptoms which we will observe and if any change will further evaluate.

## 2015-12-30 NOTE — Assessment & Plan Note (Signed)
Discuss reflux propping his bed up medication use

## 2015-12-30 NOTE — Progress Notes (Signed)
BP (!) 177/96 (BP Location: Left Arm, Patient Position: Sitting, Cuff Size: Normal)   Pulse 74   Temp 98 F (36.7 C)   Wt 175 lb 6.4 oz (79.6 kg)   SpO2 96%   BMI 25.17 kg/m    Subjective:    Patient ID: Jason Azucena Fallen., male    DOB: [birth year], 82 y.o.   MRN: [id number]  HPI: Jason W Aithan Baccam. is a 82 y.o. male  Chief Complaint  Patient presents with  . Gastroesophageal Reflux    Patient states that sometimes at night he has bile come up in his throat while he is sleeping.   Marland Kitchen FYI    Unable to reconcile patient's medication, he did not bring them or a list with him.   Patient is unaccompanied and drove himself to the office visit. This problem of reflux doesn't happen every night patient does take Prilosec daily. Propped his bed up her diet and other preventative measures. Patient does eat several hours before he goes to bed.  Of concern the patient does note a change in chest symptoms. Over the last month or so since his last visit with cardiology has developed chest pressure with no radiation no description of pain lasts last for a undetermined amount of time comes on pretty much after each meal reviewed patient's able to walk and go without chest pain chest tightness. There is no PND orthopnea or leg edema.    Relevant past medical, surgical, family and social history reviewed and updated as indicated. Interim medical history since our last visit reviewed. Allergies and medications reviewed and updated.  Review of Systems  Constitutional: Negative for activity change, appetite change, chills, diaphoresis, fatigue and fever.  Respiratory: Positive for chest tightness. Negative for cough, choking and shortness of breath.   Cardiovascular: Negative for chest pain, palpitations and leg swelling.    Per HPI unless specifically indicated above     Objective:    BP (!) 177/96 (BP Location: Left Arm, Patient Position: Sitting, Cuff Size: Normal)   Pulse 74   Temp  98 F (36.7 C)   Wt 175 lb 6.4 oz (79.6 kg)   SpO2 96%   BMI 25.17 kg/m   Wt Readings from Last 3 Encounters:  12/30/15 175 lb 6.4 oz (79.6 kg)  07/17/15 173 lb (78.5 kg)  05/07/15 176 lb (79.8 kg)    Physical Exam  Constitutional: He is oriented to person, place, and time. He appears well-developed and well-nourished. No distress.  HENT:  Head: Normocephalic and atraumatic.  Right Ear: Hearing normal.  Left Ear: Hearing normal.  Nose: Nose normal.  Eyes: Conjunctivae and lids are normal. Right eye exhibits no discharge. Left eye exhibits no discharge. No scleral icterus.  Neck: Normal range of motion. Neck supple.  Cardiovascular: Normal rate, regular rhythm and normal heart sounds.   Pulmonary/Chest: Effort normal and breath sounds normal. No respiratory distress.  Abdominal: Soft. Bowel sounds are normal. He exhibits no distension and no mass. There is no tenderness. There is no rebound and no guarding.  Musculoskeletal: Normal range of motion.  Neurological: He is alert and oriented to person, place, and time.  Skin: Skin is intact. No rash noted.  Psychiatric: He has a normal mood and affect. His speech is normal and behavior is normal. Judgment and thought content normal. Cognition and memory are normal.  EKG no acute changes no change from previous EKG  Results for orders placed or performed in visit on 07/17/15  CBC with Differential/Platelet  Result Value Ref Range   WBC 6.2 3.4 - 10.8 x10E3/uL   RBC 4.72 4.14 - 5.80 x10E6/uL   Hemoglobin 14.1 12.6 - 17.7 g/dL   Hematocrit 41.5 37.5 - 51.0 %   MCV 88 79 - 97 fL   MCH 29.9 26.6 - 33.0 pg   MCHC 34.0 31.5 - 35.7 g/dL   RDW 14.0 12.3 - 15.4 %   Platelets 258 150 - 379 x10E3/uL   Neutrophils 60 %   Lymphs 27 %   Monocytes 10 %   Eos 2 %   Basos 1 %   Neutrophils Absolute 3.7 1.4 - 7.0 x10E3/uL   Lymphocytes Absolute 1.7 0.7 - 3.1 x10E3/uL   Monocytes Absolute 0.6 0.1 - 0.9 x10E3/uL   EOS (ABSOLUTE) 0.1 0.0 - 0.4  x10E3/uL   Basophils Absolute 0.1 0.0 - 0.2 x10E3/uL   Immature Granulocytes 0 %   Immature Grans (Abs) 0.0 0.0 - 0.1 x10E3/uL  Comprehensive metabolic panel  Result Value Ref Range   Glucose 84 65 - 99 mg/dL   BUN 26 8 - 27 mg/dL   Creatinine, Ser 1.68 (H) 0.76 - 1.27 mg/dL   GFR calc non Af Amer 37 (L) >59 mL/min/1.73   GFR calc Af Amer 42 (L) >59 mL/min/1.73   BUN/Creatinine Ratio 15 10 - 24   Sodium 139 134 - 144 mmol/L   Potassium 4.5 3.5 - 5.2 mmol/L   Chloride 100 96 - 106 mmol/L   CO2 23 18 - 29 mmol/L   Calcium 9.2 8.6 - 10.2 mg/dL   Total Protein 6.6 6.0 - 8.5 g/dL   Albumin 4.3 3.5 - 4.7 g/dL   Globulin, Total 2.3 1.5 - 4.5 g/dL   Albumin/Globulin Ratio 1.9 1.2 - 2.2   Bilirubin Total 1.3 (H) 0.0 - 1.2 mg/dL   Alkaline Phosphatase 86 39 - 117 IU/L   AST 22 0 - 40 IU/L   ALT 16 0 - 44 IU/L  Lipid Panel w/o Chol/HDL Ratio  Result Value Ref Range   Cholesterol, Total 131 100 - 199 mg/dL   Triglycerides 113 0 - 149 mg/dL   HDL 37 (L) >39 mg/dL   VLDL Cholesterol Cal 23 5 - 40 mg/dL   LDL Calculated 71 0 - 99 mg/dL  TSH  Result Value Ref Range   TSH 0.813 0.450 - 4.500 uIU/mL  Urinalysis, Routine w reflex microscopic (not at [facility])  Result Value Ref Range   Specific Gravity, UA 1.010 1.005 - 1.030   pH, UA 5.0 5.0 - 7.5   Color, UA Yellow Yellow   Appearance Ur Clear Clear   Leukocytes, UA Negative Negative   Protein, UA Negative Negative/Trace   Glucose, UA Negative Negative   Ketones, UA Negative Negative   RBC, UA Negative Negative   Bilirubin, UA Negative Negative   Urobilinogen, Ur 0.2 0.2 - 1.0 mg/dL   Nitrite, UA Negative Negative      Assessment & Plan:   Problem List Items Addressed This Visit      Cardiovascular and Mediastinum   Hypertension    Discuss reflux propping his bed up medication use      Coronary artery disease    Stable with nonspecific chest symptoms which we will observe and if any change will further evaluate.       Relevant Orders   EKG 12-Lead (Completed)     Digestive   GERD (gastroesophageal reflux disease)    Other Visit Diagnoses  Immunization due    -  Primary       Follow up plan: Return in about 6 months (around 06/28/2016) for Physical Exam.

## 2016-01-01 ENCOUNTER — Other Ambulatory Visit: Payer: Self-pay

## 2016-01-01 MED ORDER — GABAPENTIN 300 MG PO CAPS: 300.0000 mg | ORAL_CAPSULE | Freq: Two times a day (BID) | ORAL | Status: DC

## 2016-01-01 NOTE — Telephone Encounter (Signed)
Request for refill on Gabapentin 300mg  capsule.

## 2016-01-22 ENCOUNTER — Ambulatory Visit: Payer: Medicare Other | Admitting: Family Medicine

## 2016-04-08 DIAGNOSIS — R251 Tremor, unspecified: Secondary | ICD-10-CM | POA: Diagnosis not present

## 2016-04-08 DIAGNOSIS — R42 Dizziness and giddiness: Secondary | ICD-10-CM | POA: Diagnosis not present

## 2016-04-08 DIAGNOSIS — R262 Difficulty in walking, not elsewhere classified: Secondary | ICD-10-CM | POA: Diagnosis not present

## 2016-04-10 DIAGNOSIS — M545 Low back pain: Secondary | ICD-10-CM | POA: Diagnosis not present

## 2016-04-10 DIAGNOSIS — Z87891 Personal history of nicotine dependence: Secondary | ICD-10-CM | POA: Diagnosis not present

## 2016-04-10 DIAGNOSIS — R2681 Unsteadiness on feet: Secondary | ICD-10-CM | POA: Diagnosis not present

## 2016-04-10 DIAGNOSIS — G4730 Sleep apnea, unspecified: Secondary | ICD-10-CM | POA: Diagnosis not present

## 2016-04-10 DIAGNOSIS — Z951 Presence of aortocoronary bypass graft: Secondary | ICD-10-CM | POA: Diagnosis not present

## 2016-04-10 DIAGNOSIS — N400 Enlarged prostate without lower urinary tract symptoms: Secondary | ICD-10-CM | POA: Diagnosis not present

## 2016-04-10 DIAGNOSIS — M6281 Muscle weakness (generalized): Secondary | ICD-10-CM | POA: Diagnosis not present

## 2016-04-10 DIAGNOSIS — K219 Gastro-esophageal reflux disease without esophagitis: Secondary | ICD-10-CM | POA: Diagnosis not present

## 2016-04-10 DIAGNOSIS — I10 Essential (primary) hypertension: Secondary | ICD-10-CM | POA: Diagnosis not present

## 2016-04-10 DIAGNOSIS — E785 Hyperlipidemia, unspecified: Secondary | ICD-10-CM | POA: Diagnosis not present

## 2016-04-14 DIAGNOSIS — E785 Hyperlipidemia, unspecified: Secondary | ICD-10-CM | POA: Diagnosis not present

## 2016-04-14 DIAGNOSIS — M545 Low back pain: Secondary | ICD-10-CM | POA: Diagnosis not present

## 2016-04-14 DIAGNOSIS — G4730 Sleep apnea, unspecified: Secondary | ICD-10-CM | POA: Diagnosis not present

## 2016-04-14 DIAGNOSIS — K219 Gastro-esophageal reflux disease without esophagitis: Secondary | ICD-10-CM | POA: Diagnosis not present

## 2016-04-14 DIAGNOSIS — R2681 Unsteadiness on feet: Secondary | ICD-10-CM | POA: Diagnosis not present

## 2016-04-14 DIAGNOSIS — M6281 Muscle weakness (generalized): Secondary | ICD-10-CM | POA: Diagnosis not present

## 2016-04-14 DIAGNOSIS — Z87891 Personal history of nicotine dependence: Secondary | ICD-10-CM | POA: Diagnosis not present

## 2016-04-14 DIAGNOSIS — N400 Enlarged prostate without lower urinary tract symptoms: Secondary | ICD-10-CM | POA: Diagnosis not present

## 2016-04-14 DIAGNOSIS — Z951 Presence of aortocoronary bypass graft: Secondary | ICD-10-CM | POA: Diagnosis not present

## 2016-04-14 DIAGNOSIS — I10 Essential (primary) hypertension: Secondary | ICD-10-CM | POA: Diagnosis not present

## 2016-04-15 DIAGNOSIS — K219 Gastro-esophageal reflux disease without esophagitis: Secondary | ICD-10-CM | POA: Diagnosis not present

## 2016-04-15 DIAGNOSIS — G4730 Sleep apnea, unspecified: Secondary | ICD-10-CM | POA: Diagnosis not present

## 2016-04-15 DIAGNOSIS — Z951 Presence of aortocoronary bypass graft: Secondary | ICD-10-CM | POA: Diagnosis not present

## 2016-04-15 DIAGNOSIS — M545 Low back pain: Secondary | ICD-10-CM | POA: Diagnosis not present

## 2016-04-15 DIAGNOSIS — N400 Enlarged prostate without lower urinary tract symptoms: Secondary | ICD-10-CM | POA: Diagnosis not present

## 2016-04-15 DIAGNOSIS — M6281 Muscle weakness (generalized): Secondary | ICD-10-CM | POA: Diagnosis not present

## 2016-04-15 DIAGNOSIS — E785 Hyperlipidemia, unspecified: Secondary | ICD-10-CM | POA: Diagnosis not present

## 2016-04-15 DIAGNOSIS — Z87891 Personal history of nicotine dependence: Secondary | ICD-10-CM | POA: Diagnosis not present

## 2016-04-15 DIAGNOSIS — R2681 Unsteadiness on feet: Secondary | ICD-10-CM | POA: Diagnosis not present

## 2016-04-15 DIAGNOSIS — I10 Essential (primary) hypertension: Secondary | ICD-10-CM | POA: Diagnosis not present

## 2016-04-17 DIAGNOSIS — K219 Gastro-esophageal reflux disease without esophagitis: Secondary | ICD-10-CM | POA: Diagnosis not present

## 2016-04-17 DIAGNOSIS — E785 Hyperlipidemia, unspecified: Secondary | ICD-10-CM | POA: Diagnosis not present

## 2016-04-17 DIAGNOSIS — Z87891 Personal history of nicotine dependence: Secondary | ICD-10-CM | POA: Diagnosis not present

## 2016-04-17 DIAGNOSIS — M545 Low back pain: Secondary | ICD-10-CM | POA: Diagnosis not present

## 2016-04-17 DIAGNOSIS — N400 Enlarged prostate without lower urinary tract symptoms: Secondary | ICD-10-CM | POA: Diagnosis not present

## 2016-04-17 DIAGNOSIS — M6281 Muscle weakness (generalized): Secondary | ICD-10-CM | POA: Diagnosis not present

## 2016-04-17 DIAGNOSIS — G4730 Sleep apnea, unspecified: Secondary | ICD-10-CM | POA: Diagnosis not present

## 2016-04-17 DIAGNOSIS — R2681 Unsteadiness on feet: Secondary | ICD-10-CM | POA: Diagnosis not present

## 2016-04-17 DIAGNOSIS — Z951 Presence of aortocoronary bypass graft: Secondary | ICD-10-CM | POA: Diagnosis not present

## 2016-04-17 DIAGNOSIS — I10 Essential (primary) hypertension: Secondary | ICD-10-CM | POA: Diagnosis not present

## 2016-04-20 DIAGNOSIS — E785 Hyperlipidemia, unspecified: Secondary | ICD-10-CM | POA: Diagnosis not present

## 2016-04-20 DIAGNOSIS — M6281 Muscle weakness (generalized): Secondary | ICD-10-CM | POA: Diagnosis not present

## 2016-04-20 DIAGNOSIS — I10 Essential (primary) hypertension: Secondary | ICD-10-CM | POA: Diagnosis not present

## 2016-04-20 DIAGNOSIS — Z87891 Personal history of nicotine dependence: Secondary | ICD-10-CM | POA: Diagnosis not present

## 2016-04-20 DIAGNOSIS — K219 Gastro-esophageal reflux disease without esophagitis: Secondary | ICD-10-CM | POA: Diagnosis not present

## 2016-04-20 DIAGNOSIS — M545 Low back pain: Secondary | ICD-10-CM | POA: Diagnosis not present

## 2016-04-20 DIAGNOSIS — R2681 Unsteadiness on feet: Secondary | ICD-10-CM | POA: Diagnosis not present

## 2016-04-20 DIAGNOSIS — Z951 Presence of aortocoronary bypass graft: Secondary | ICD-10-CM | POA: Diagnosis not present

## 2016-04-20 DIAGNOSIS — G4730 Sleep apnea, unspecified: Secondary | ICD-10-CM | POA: Diagnosis not present

## 2016-04-20 DIAGNOSIS — N400 Enlarged prostate without lower urinary tract symptoms: Secondary | ICD-10-CM | POA: Diagnosis not present

## 2016-04-22 DIAGNOSIS — I10 Essential (primary) hypertension: Secondary | ICD-10-CM | POA: Diagnosis not present

## 2016-04-22 DIAGNOSIS — M6281 Muscle weakness (generalized): Secondary | ICD-10-CM | POA: Diagnosis not present

## 2016-04-22 DIAGNOSIS — Z951 Presence of aortocoronary bypass graft: Secondary | ICD-10-CM | POA: Diagnosis not present

## 2016-04-22 DIAGNOSIS — K219 Gastro-esophageal reflux disease without esophagitis: Secondary | ICD-10-CM | POA: Diagnosis not present

## 2016-04-22 DIAGNOSIS — Z87891 Personal history of nicotine dependence: Secondary | ICD-10-CM | POA: Diagnosis not present

## 2016-04-22 DIAGNOSIS — M545 Low back pain: Secondary | ICD-10-CM | POA: Diagnosis not present

## 2016-04-22 DIAGNOSIS — G4730 Sleep apnea, unspecified: Secondary | ICD-10-CM | POA: Diagnosis not present

## 2016-04-22 DIAGNOSIS — N400 Enlarged prostate without lower urinary tract symptoms: Secondary | ICD-10-CM | POA: Diagnosis not present

## 2016-04-22 DIAGNOSIS — R2681 Unsteadiness on feet: Secondary | ICD-10-CM | POA: Diagnosis not present

## 2016-04-22 DIAGNOSIS — E785 Hyperlipidemia, unspecified: Secondary | ICD-10-CM | POA: Diagnosis not present

## 2016-04-23 ENCOUNTER — Ambulatory Visit (INDEPENDENT_AMBULATORY_CARE_PROVIDER_SITE_OTHER): Payer: Medicare HMO | Admitting: Family Medicine

## 2016-04-23 ENCOUNTER — Encounter: Payer: Self-pay | Admitting: Family Medicine

## 2016-04-23 VITALS — BP 156/80 | HR 73 | Temp 98.2°F | Wt 181.0 lb

## 2016-04-23 DIAGNOSIS — J069 Acute upper respiratory infection, unspecified: Secondary | ICD-10-CM | POA: Diagnosis not present

## 2016-04-23 DIAGNOSIS — B9789 Other viral agents as the cause of diseases classified elsewhere: Secondary | ICD-10-CM

## 2016-04-23 DIAGNOSIS — R6889 Other general symptoms and signs: Secondary | ICD-10-CM | POA: Diagnosis not present

## 2016-04-23 LAB — VERITOR FLU A/B WAIVED
Influenza A: NEGATIVE
Influenza B: NEGATIVE

## 2016-04-23 MED ORDER — FLUTICASONE PROPIONATE 50 MCG/ACT NA SUSP: 2.0000 | Freq: Every day | NASAL | 0 refills | Status: DC

## 2016-04-23 MED ORDER — ALBUTEROL SULFATE HFA 108 (90 BASE) MCG/ACT IN AERS: 2.0000 | INHALATION_SPRAY | Freq: Four times a day (QID) | RESPIRATORY_TRACT | 0 refills | Status: DC | PRN

## 2016-04-23 MED ORDER — GUAIFENESIN ER 600 MG PO TB12
600.0000 mg | ORAL_TABLET | Freq: Two times a day (BID) | ORAL | 0 refills | Status: DC
Start: 2016-04-23 — End: 2016-07-21

## 2016-04-23 MED ORDER — AZITHROMYCIN 250 MG PO TABS: ORAL_TABLET | ORAL | 0 refills | Status: DC

## 2016-04-23 NOTE — Progress Notes (Signed)
   BP (!) 156/80   Pulse 73   Temp 98.2 F (36.8 C)   Wt 181 lb (82.1 kg)   SpO2 98%   BMI 25.97 kg/m    Subjective:    Patient ID: Jason Cross., male    DOB: [birth year], 83 y.o.   MRN: [id number]  HPI: Jason Cross. is a 83 y.o. male  Chief Complaint  Patient presents with  . URI    chest congestion, productive cough, runny nose, some body aches. No fever. No sore throat.   Productive cough, congestion, rhinorrhea, aches x 3-4 days. Denies fever, chills, Has tried tylenol so far for sxs. Wife has also been sick.   Relevant past medical, surgical, family and social history reviewed and updated as indicated. Interim medical history since our last visit reviewed. Allergies and medications reviewed and updated.  Review of Systems  Constitutional: Negative.   HENT: Positive for congestion and rhinorrhea.   Eyes: Negative.   Respiratory: Positive for cough.   Cardiovascular: Negative.   Gastrointestinal: Negative.   Genitourinary: Negative.   Musculoskeletal: Positive for myalgias.  Neurological: Negative.   Psychiatric/Behavioral: Negative.     Per HPI unless specifically indicated above     Objective:    BP (!) 156/80   Pulse 73   Temp 98.2 F (36.8 C)   Wt 181 lb (82.1 kg)   SpO2 98%   BMI 25.97 kg/m   Wt Readings from Last 3 Encounters:  04/23/16 181 lb (82.1 kg)  12/30/15 175 lb 6.4 oz (79.6 kg)  07/17/15 173 lb (78.5 kg)    Physical Exam  Constitutional: He appears well-developed and well-nourished.  HENT:  Head: Atraumatic.  Nose: Nose normal.  Eyes: Conjunctivae are normal. Pupils are equal, round, and reactive to light.  Neck: Normal range of motion. Neck supple.  Cardiovascular: Normal rate, regular rhythm and normal heart sounds.   Pulmonary/Chest: Effort normal and breath sounds normal. No respiratory distress.  Musculoskeletal: Normal range of motion.  Neurological: He is alert. No cranial nerve deficit.  Skin: Skin is warm  and dry.  Psychiatric: He has a normal mood and affect. His behavior is normal.      Assessment & Plan:   Problem List Items Addressed This Visit    None    Visit Diagnoses    Viral URI with cough    -  Primary   Discussed trying plain mucinex and flonase, and if worsening or no improvement over weekend take zpak. Albuterol inhaler prn.    Relevant Medications   azithromycin (ZITHROMAX) 250 MG tablet   Other Relevant Orders   Influenza A & B (STAT)       Follow up plan: Return if symptoms worsen or fail to improve.

## 2016-04-23 NOTE — Patient Instructions (Signed)
Follow up as needed

## 2016-04-24 DIAGNOSIS — N400 Enlarged prostate without lower urinary tract symptoms: Secondary | ICD-10-CM | POA: Diagnosis not present

## 2016-04-24 DIAGNOSIS — Z87891 Personal history of nicotine dependence: Secondary | ICD-10-CM | POA: Diagnosis not present

## 2016-04-24 DIAGNOSIS — G4730 Sleep apnea, unspecified: Secondary | ICD-10-CM | POA: Diagnosis not present

## 2016-04-24 DIAGNOSIS — I10 Essential (primary) hypertension: Secondary | ICD-10-CM | POA: Diagnosis not present

## 2016-04-24 DIAGNOSIS — M6281 Muscle weakness (generalized): Secondary | ICD-10-CM | POA: Diagnosis not present

## 2016-04-24 DIAGNOSIS — M545 Low back pain: Secondary | ICD-10-CM | POA: Diagnosis not present

## 2016-04-24 DIAGNOSIS — Z951 Presence of aortocoronary bypass graft: Secondary | ICD-10-CM | POA: Diagnosis not present

## 2016-04-24 DIAGNOSIS — E785 Hyperlipidemia, unspecified: Secondary | ICD-10-CM | POA: Diagnosis not present

## 2016-04-24 DIAGNOSIS — R2681 Unsteadiness on feet: Secondary | ICD-10-CM | POA: Diagnosis not present

## 2016-04-24 DIAGNOSIS — K219 Gastro-esophageal reflux disease without esophagitis: Secondary | ICD-10-CM | POA: Diagnosis not present

## 2016-04-28 DIAGNOSIS — Z87891 Personal history of nicotine dependence: Secondary | ICD-10-CM | POA: Diagnosis not present

## 2016-04-28 DIAGNOSIS — M6281 Muscle weakness (generalized): Secondary | ICD-10-CM | POA: Diagnosis not present

## 2016-04-28 DIAGNOSIS — I10 Essential (primary) hypertension: Secondary | ICD-10-CM | POA: Diagnosis not present

## 2016-04-28 DIAGNOSIS — E785 Hyperlipidemia, unspecified: Secondary | ICD-10-CM | POA: Diagnosis not present

## 2016-04-28 DIAGNOSIS — N400 Enlarged prostate without lower urinary tract symptoms: Secondary | ICD-10-CM | POA: Diagnosis not present

## 2016-04-28 DIAGNOSIS — M545 Low back pain: Secondary | ICD-10-CM | POA: Diagnosis not present

## 2016-04-28 DIAGNOSIS — R2681 Unsteadiness on feet: Secondary | ICD-10-CM | POA: Diagnosis not present

## 2016-04-28 DIAGNOSIS — K219 Gastro-esophageal reflux disease without esophagitis: Secondary | ICD-10-CM | POA: Diagnosis not present

## 2016-04-28 DIAGNOSIS — Z951 Presence of aortocoronary bypass graft: Secondary | ICD-10-CM | POA: Diagnosis not present

## 2016-04-28 DIAGNOSIS — G4730 Sleep apnea, unspecified: Secondary | ICD-10-CM | POA: Diagnosis not present

## 2016-04-30 DIAGNOSIS — M545 Low back pain: Secondary | ICD-10-CM | POA: Diagnosis not present

## 2016-04-30 DIAGNOSIS — E785 Hyperlipidemia, unspecified: Secondary | ICD-10-CM | POA: Diagnosis not present

## 2016-04-30 DIAGNOSIS — K219 Gastro-esophageal reflux disease without esophagitis: Secondary | ICD-10-CM | POA: Diagnosis not present

## 2016-04-30 DIAGNOSIS — M6281 Muscle weakness (generalized): Secondary | ICD-10-CM | POA: Diagnosis not present

## 2016-04-30 DIAGNOSIS — I10 Essential (primary) hypertension: Secondary | ICD-10-CM | POA: Diagnosis not present

## 2016-04-30 DIAGNOSIS — R2681 Unsteadiness on feet: Secondary | ICD-10-CM | POA: Diagnosis not present

## 2016-04-30 DIAGNOSIS — Z951 Presence of aortocoronary bypass graft: Secondary | ICD-10-CM | POA: Diagnosis not present

## 2016-04-30 DIAGNOSIS — G4730 Sleep apnea, unspecified: Secondary | ICD-10-CM | POA: Diagnosis not present

## 2016-04-30 DIAGNOSIS — Z87891 Personal history of nicotine dependence: Secondary | ICD-10-CM | POA: Diagnosis not present

## 2016-04-30 DIAGNOSIS — N400 Enlarged prostate without lower urinary tract symptoms: Secondary | ICD-10-CM | POA: Diagnosis not present

## 2016-05-04 DIAGNOSIS — M545 Low back pain: Secondary | ICD-10-CM | POA: Diagnosis not present

## 2016-05-04 DIAGNOSIS — G4730 Sleep apnea, unspecified: Secondary | ICD-10-CM | POA: Diagnosis not present

## 2016-05-04 DIAGNOSIS — R2681 Unsteadiness on feet: Secondary | ICD-10-CM | POA: Diagnosis not present

## 2016-05-04 DIAGNOSIS — N400 Enlarged prostate without lower urinary tract symptoms: Secondary | ICD-10-CM | POA: Diagnosis not present

## 2016-05-04 DIAGNOSIS — I10 Essential (primary) hypertension: Secondary | ICD-10-CM | POA: Diagnosis not present

## 2016-05-04 DIAGNOSIS — E785 Hyperlipidemia, unspecified: Secondary | ICD-10-CM | POA: Diagnosis not present

## 2016-05-04 DIAGNOSIS — Z87891 Personal history of nicotine dependence: Secondary | ICD-10-CM | POA: Diagnosis not present

## 2016-05-04 DIAGNOSIS — Z951 Presence of aortocoronary bypass graft: Secondary | ICD-10-CM | POA: Diagnosis not present

## 2016-05-04 DIAGNOSIS — M6281 Muscle weakness (generalized): Secondary | ICD-10-CM | POA: Diagnosis not present

## 2016-05-04 DIAGNOSIS — K219 Gastro-esophageal reflux disease without esophagitis: Secondary | ICD-10-CM | POA: Diagnosis not present

## 2016-05-05 DIAGNOSIS — R0782 Intercostal pain: Secondary | ICD-10-CM | POA: Diagnosis not present

## 2016-05-05 DIAGNOSIS — E782 Mixed hyperlipidemia: Secondary | ICD-10-CM | POA: Diagnosis not present

## 2016-05-05 DIAGNOSIS — I38 Endocarditis, valve unspecified: Secondary | ICD-10-CM | POA: Diagnosis not present

## 2016-05-05 DIAGNOSIS — G4733 Obstructive sleep apnea (adult) (pediatric): Secondary | ICD-10-CM | POA: Diagnosis not present

## 2016-05-05 DIAGNOSIS — I428 Other cardiomyopathies: Secondary | ICD-10-CM | POA: Diagnosis not present

## 2016-05-05 DIAGNOSIS — I25810 Atherosclerosis of coronary artery bypass graft(s) without angina pectoris: Secondary | ICD-10-CM | POA: Diagnosis not present

## 2016-05-05 DIAGNOSIS — R42 Dizziness and giddiness: Secondary | ICD-10-CM | POA: Diagnosis not present

## 2016-05-06 DIAGNOSIS — I10 Essential (primary) hypertension: Secondary | ICD-10-CM | POA: Diagnosis not present

## 2016-05-06 DIAGNOSIS — Z87891 Personal history of nicotine dependence: Secondary | ICD-10-CM | POA: Diagnosis not present

## 2016-05-06 DIAGNOSIS — Z951 Presence of aortocoronary bypass graft: Secondary | ICD-10-CM | POA: Diagnosis not present

## 2016-05-06 DIAGNOSIS — M6281 Muscle weakness (generalized): Secondary | ICD-10-CM | POA: Diagnosis not present

## 2016-05-06 DIAGNOSIS — E785 Hyperlipidemia, unspecified: Secondary | ICD-10-CM | POA: Diagnosis not present

## 2016-05-06 DIAGNOSIS — R2681 Unsteadiness on feet: Secondary | ICD-10-CM | POA: Diagnosis not present

## 2016-05-06 DIAGNOSIS — K219 Gastro-esophageal reflux disease without esophagitis: Secondary | ICD-10-CM | POA: Diagnosis not present

## 2016-05-06 DIAGNOSIS — M545 Low back pain: Secondary | ICD-10-CM | POA: Diagnosis not present

## 2016-05-06 DIAGNOSIS — G4730 Sleep apnea, unspecified: Secondary | ICD-10-CM | POA: Diagnosis not present

## 2016-05-06 DIAGNOSIS — N400 Enlarged prostate without lower urinary tract symptoms: Secondary | ICD-10-CM | POA: Diagnosis not present

## 2016-05-19 ENCOUNTER — Encounter: Payer: Self-pay | Admitting: *Deleted

## 2016-05-19 ENCOUNTER — Emergency Department: Payer: Medicare HMO

## 2016-05-19 ENCOUNTER — Emergency Department
Admission: EM | Admit: 2016-05-19 | Discharge: 2016-05-19 | Disposition: A | Discharge status: Home / Self Care | Payer: Medicare HMO | Attending: Emergency Medicine | Admitting: Emergency Medicine

## 2016-05-19 DIAGNOSIS — Z79899 Other long term (current) drug therapy: Secondary | ICD-10-CM | POA: Insufficient documentation

## 2016-05-19 DIAGNOSIS — Z951 Presence of aortocoronary bypass graft: Secondary | ICD-10-CM | POA: Diagnosis not present

## 2016-05-19 DIAGNOSIS — R079 Chest pain, unspecified: Secondary | ICD-10-CM | POA: Diagnosis not present

## 2016-05-19 DIAGNOSIS — R0789 Other chest pain: Secondary | ICD-10-CM | POA: Diagnosis not present

## 2016-05-19 DIAGNOSIS — I2510 Atherosclerotic heart disease of native coronary artery without angina pectoris: Secondary | ICD-10-CM | POA: Insufficient documentation

## 2016-05-19 DIAGNOSIS — Z87891 Personal history of nicotine dependence: Secondary | ICD-10-CM | POA: Insufficient documentation

## 2016-05-19 DIAGNOSIS — R0602 Shortness of breath: Secondary | ICD-10-CM | POA: Insufficient documentation

## 2016-05-19 DIAGNOSIS — Z7982 Long term (current) use of aspirin: Secondary | ICD-10-CM | POA: Diagnosis not present

## 2016-05-19 DIAGNOSIS — J449 Chronic obstructive pulmonary disease, unspecified: Secondary | ICD-10-CM | POA: Insufficient documentation

## 2016-05-19 DIAGNOSIS — I10 Essential (primary) hypertension: Secondary | ICD-10-CM | POA: Diagnosis not present

## 2016-05-19 LAB — BASIC METABOLIC PANEL
Anion gap: 6 (ref 5–15)
BUN: 21 mg/dL — ABNORMAL HIGH (ref 6–20)
CALCIUM: 9.5 mg/dL (ref 8.9–10.3)
CO2: 27 mmol/L (ref 22–32)
CREATININE: 1.55 mg/dL — AB (ref 0.61–1.24)
Chloride: 106 mmol/L (ref 101–111)
GFR calc non Af Amer: 39 mL/min — ABNORMAL LOW (ref >60)
GFR, EST AFRICAN AMERICAN: 45 mL/min — AB (ref >60)
Glucose, Bld: 94 mg/dL (ref 65–99)
Potassium: 4 mmol/L (ref 3.5–5.1)
Sodium: 139 mmol/L (ref 135–145)

## 2016-05-19 LAB — CBC
HCT: 45.2 % (ref 40.0–52.0)
Hemoglobin: 14.7 g/dL (ref 13.0–18.0)
MCH: 28.7 pg (ref 26.0–34.0)
MCHC: 32.6 g/dL (ref 32.0–36.0)
MCV: 88.1 fL (ref 80.0–100.0)
Platelets: 228 10*3/uL (ref 150–440)
RBC: 5.14 MIL/uL (ref 4.40–5.90)
RDW: 15.1 % — ABNORMAL HIGH (ref 11.5–14.5)
WBC: 7.5 10*3/uL (ref 3.8–10.6)

## 2016-05-19 LAB — TROPONIN I: Troponin I: <0.03 ng/mL (ref <0.03)

## 2016-05-19 NOTE — ED Provider Notes (Signed)
Time Seen: Approximately 1104  I have reviewed the triage notes  Chief Complaint: Chest Pain and Shortness of Breath   History of Present Illness: Jason W Dossie Swor. is a 83 y.o. male who states he had some substernal chest discomfort earlier this afternoon approximately 4:00 he states it was intermittent over several hours and seemed to be relieved by his "" stretching "". He denies any nausea, vomiting, arm, jaw pain. He denies any nausea, vomiting, shortness of breath to this historian. He denies any diaphoresis. He states he's recently recovering from a cold and denies any productive cough or wheezing. Patient gone to the fire department due to his blood pressure being elevated and had told them he was having some chest discomfort and was transported here by EMS  Past Medical History:  Diagnosis Date  . Arthritis   . Benign prostate hyperplasia   . Cataracts, bilateral   . Coarse tremors    L>R hands  . COPD (chronic obstructive pulmonary disease) ([place])    first stages  . Coronary artery disease   . Dizziness    occasional  . Gastric ulcer    in the 60's  . GERD (gastroesophageal reflux disease)    takes Omeprazole every other day  . Hard of hearing    doesn't wear hearing aids  . History of colon polyps   . History of kidney stones    still one on the left side, low  . Hyperlipidemia    takes Simvastatin daily  . Hypertension    stopped BP meds, BP under control  . Myocardial infarction 2009  . Peripheral neuropathy (HCC)    takes Gabapentin daily  . Pneumonia    hx of in the 60's  . Sleep apnea     Patient Active Problem List   Diagnosis Date Noted  . GERD (gastroesophageal reflux disease) 12/30/2015  . Peripheral neuropathy ([place]) 07/17/2015  . Dementia 07/17/2015  . Orthostatic hypotension 07/17/2015  . Hypertension 07/17/2015  . Coronary artery disease 07/17/2015  . Urinary retention with incomplete bladder emptying 03/20/2013  . Renal failure, acute  on chronic (HCC) 03/20/2013  . Unspecified constipation 03/20/2013  . Lumbar spondylolysis 03/14/2013  . Lumbar scoliosis 03/10/2013    Past Surgical History:  Procedure Laterality Date  . BACK SURGERY  2007  . COLONOSCOPY    . CORONARY ARTERY BYPASS GRAFT  2009   LIMA to LAD, SVG to D1 07/2007  . EYE SURGERY Bilateral 2004, 2005   implants  . GREEN LIGHT LASER TURP (TRANSURETHRAL RESECTION OF PROSTATE N/A 05/10/2013   Procedure: GREEN LIGHT LASER TURP (TRANSURETHRAL RESECTION OF PROSTATE;  Surgeon: Ardis Hughs, MD;  Location: [facility];  Service: Urology;  Laterality: N/A;  . lipoma removed     chest wall  . microthermo prostate  2009  . [place]  . PROSTATE SURGERY  10/14  . THORACIC DISCECTOMY N/A 03/10/2013   Procedure: Thoracic twelve-Lumbar one Laminectomy;  Surgeon: Erline Levine, MD;  Location: [facility];  Service: Neurosurgery;  Laterality: N/A;  Thoracic twelve-Lumbar one Laminectomy  . TONSILLECTOMY     at age 29    Past Surgical History:  Procedure Laterality Date  . BACK SURGERY  2007  . COLONOSCOPY    . CORONARY ARTERY BYPASS GRAFT  2009   LIMA to LAD, SVG to D1 07/2007  . EYE SURGERY Bilateral 2004, 2005   implants  . GREEN LIGHT LASER TURP (TRANSURETHRAL RESECTION OF PROSTATE N/A 05/10/2013  Procedure: GREEN LIGHT LASER TURP (TRANSURETHRAL RESECTION OF PROSTATE;  Surgeon: Ardis Hughs, MD;  Location: [facility];  Service: Urology;  Laterality: N/A;  . lipoma removed     chest wall  . microthermo prostate  2009  . [place]  . PROSTATE SURGERY  10/14  . THORACIC DISCECTOMY N/A 03/10/2013   Procedure: Thoracic twelve-Lumbar one Laminectomy;  Surgeon: Erline Levine, MD;  Location: [facility];  Service: Neurosurgery;  Laterality: N/A;  Thoracic twelve-Lumbar one Laminectomy  . TONSILLECTOMY     at age 19    Current Outpatient Rx  . Order #: [id number] Class: Normal  . Order #: [id number] Class: Historical Med  . Order #:  [id number] Class: Normal  . Order #: [id number] Class: Normal  . Order #: [id number] Class: Normal  . Order #: [id number] Class: No Print  . Order #: [id number] Class: Normal  . Order #: [id number] Class: Historical Med  . Order #: [id number] Class: Historical Med    Allergies:  Contrast media [iodinated diagnostic agents] and Codeine  Family History: Family History  Problem Relation Age of Onset  . Heart disease Father   . Hypertension Father   . Heart attack Father   . Thyroid disease Father   . Cancer Sister     breast    Social History: Social History  Substance Use Topics  . Smoking status: Former Smoker    Years: 40.00    Types: Cigarettes    Quit date: 02/23/1998  . Smokeless tobacco: Never Used  . Alcohol use No     Review of Systems:   10 point review of systems was performed and was otherwise negative:  Constitutional: No fever Eyes: No visual disturbances ENT: No sore throat, ear pain Cardiac: He points primarily to the mid sternal region where he has an old scar from his cardiothoracic surgery previously 9 years ago and states it still somewhat tender. Respiratory: No shortness of breath, wheezing, or stridor Abdomen: No abdominal pain, no vomiting, No diarrhea Endocrine: No weight loss, No night sweats Extremities: No peripheral edema, cyanosis Skin: No rashes, easy bruising Neurologic: No focal weakness, trouble with speech or swollowing Urologic: No dysuria, Hematuria, or urinary frequency   Physical Exam:  ED Triage Vitals [05/19/16 1840]  Enc Vitals Group     BP (!) 191/76     Pulse Rate 63     Resp 16     Temp 97.9 F (36.6 C)     Temp Source Oral     SpO2 96 %     Weight 180 lb (81.6 kg)     Height 5\' 11"  (1.803 m)     Head Circumference      Peak Flow      Pain Score 5     Pain Loc      Pain Edu?      Excl. in [place]?     General: Awake , Alert , and Oriented times 3; GCS 15 Head: Normal cephalic , atraumatic Eyes: Pupils equal , round,  reactive to light Nose/Throat: No nasal drainage, patent upper airway without erythema or exudate.  Neck: Supple, Full range of motion, No anterior adenopathy or palpable thyroid masses Lungs: Clear to ascultation without wheezes , rhonchi, or rales Heart: Regular rate, regular rhythm without murmurs , gallops , or rubs Abdomen: Soft, non tender without rebound, guarding , or rigidity; bowel sounds positive and symmetric in all 4 quadrants. No organomegaly .        Extremities: 2 plus symmetric pulses.  No edema, clubbing or cyanosis Neurologic: normal ambulation, Motor symmetric without deficits, sensory intact Skin: warm, dry, no rashes Mild reproducible pain with palpation across the sternal region no crepitus or step-off noted.  Labs:   All laboratory work was reviewed including any pertinent negatives or positives listed below:  Labs Reviewed  BASIC METABOLIC PANEL - Abnormal; Notable for the following:       Result Value   BUN 21 (*)    Creatinine, Ser 1.55 (*)    GFR calc non Af Amer 39 (*)    GFR calc Af Amer 45 (*)    All other components within normal limits  CBC - Abnormal; Notable for the following:    RDW 15.1 (*)    All other components within normal limits  TROPONIN I  TROPONIN I   Initial and repeat troponin was negative EKG: *  ED ECG REPORT I, Daymon Larsen, the attending physician, personally viewed and interpreted this ECG.  Date: 05/19/2016 EKG Time: 1835Rate: 68Rhythm: normal sinus rhythm QRS Axis: normal Intervals: normal ST/T Wave abnormalities: normal Conduction Disturbances: none Narrative Interpretation: unremarkable Normal EKG  Radiology:  "Dg Chest 2 View  Result Date: 05/19/2016 CLINICAL DATA:  Acute onset of left-sided chest pain. Initial encounter. EXAM: CHEST  2 VIEW COMPARISON:  CT of the chest performed 07/05/2013 FINDINGS: The lungs are well-aerated. Mild vascular congestion is noted. There is no evidence of focal opacification,  pleural effusion or pneumothorax. The heart is normal in size; the patient is status post median sternotomy. No acute osseous abnormalities are seen. IMPRESSION: Mild vascular congestion noted.  Lungs remain grossly clear. Electronically Signed   By: Garald Balding M.D.   On: 05/19/2016 19:18  "  I personally reviewed the radiologic studies    ED Course:  Patient's stay here was uneventful and I felt given 2 negative troponins with a story that sounds more like musculoskeletal chest pain along with a normal EKG this was unlikely to be a life-threatening cause for his chest discomfort at this time. He does have a cardiologist and he was advised to contact them in the morning for follow-up as soon as possible. Spent approximately 5 years according to his family members with him since he's had his last evaluation of his heart. The patient was advised to return here if he has any return of pain, arm, jaw pain or any other associated symptoms or new symptoms such as fever or productive cough etc.     Assessment Acute Unspecified chest pain Final Clinical Impression:  * Final diagnoses:  Chest wall pain     Plan: * Outpatient Patient was advised to return immediately if condition worsens. Patient was advised to follow up with their primary care physician or other specialized physicians involved in their outpatient care. The patient and/or family member/power of attorney had laboratory results reviewed at the bedside. All questions and concerns were addressed and appropriate discharge instructions were distributed by the nursing staff.            Daymon Larsen, MD 05/19/16 [phone]

## 2016-05-19 NOTE — ED Triage Notes (Signed)
States left sided chest pain that began this afternoon, states pain comes and goes, at present he denies any pain, denies any SOB, pain tight in nature, awake and alert

## 2016-05-19 NOTE — ED Notes (Addendum)
Pt refusing to be put on cardiac monitor at this time.  Pt stating that he is ready to go.  Pt states that the chest pain still comes and goes, but that it is relieved when he stretches.  Pt states he probably lifted something heaving and strained some muscles.

## 2016-05-19 NOTE — Discharge Instructions (Signed)
Please return immediately if condition worsens. Please contact her primary physician or the physician you were given for referral. If you have any specialist physicians involved in her treatment and plan please also contact them. Thank you for using [facility]. ° °

## 2016-05-19 NOTE — ED Notes (Signed)
Pt sitting in lobby in w/c with wife; pt denies any c/o at present and is wanting to leave; pt encouraged to stay & be evaluated further; pt voices understanding

## 2016-06-03 ENCOUNTER — Other Ambulatory Visit: Payer: Self-pay | Admitting: Family Medicine

## 2016-06-10 ENCOUNTER — Telehealth: Payer: Self-pay

## 2016-06-10 MED ORDER — FLUTICASONE PROPIONATE 50 MCG/ACT NA SUSP: 2.0000 | Freq: Every day | NASAL | 3 refills | Status: DC

## 2016-06-10 NOTE — Telephone Encounter (Signed)
90day supply sent.

## 2016-06-10 NOTE — Telephone Encounter (Signed)
Pharmacy requests that his Flonase be switched to a 90 day supply.

## 2016-07-21 ENCOUNTER — Encounter: Payer: Self-pay | Admitting: Family Medicine

## 2016-07-21 ENCOUNTER — Ambulatory Visit (INDEPENDENT_AMBULATORY_CARE_PROVIDER_SITE_OTHER): Payer: Medicare HMO | Admitting: Family Medicine

## 2016-07-21 VITALS — BP 144/78 | HR 62 | Ht 69.75 in | Wt 180.0 lb

## 2016-07-21 DIAGNOSIS — N179 Acute kidney failure, unspecified: Secondary | ICD-10-CM

## 2016-07-21 DIAGNOSIS — I2510 Atherosclerotic heart disease of native coronary artery without angina pectoris: Secondary | ICD-10-CM | POA: Diagnosis not present

## 2016-07-21 DIAGNOSIS — I10 Essential (primary) hypertension: Secondary | ICD-10-CM

## 2016-07-21 DIAGNOSIS — N189 Chronic kidney disease, unspecified: Secondary | ICD-10-CM | POA: Diagnosis not present

## 2016-07-21 DIAGNOSIS — F0390 Unspecified dementia without behavioral disturbance: Secondary | ICD-10-CM

## 2016-07-21 DIAGNOSIS — Z1329 Encounter for screening for other suspected endocrine disorder: Secondary | ICD-10-CM

## 2016-07-21 DIAGNOSIS — R69 Illness, unspecified: Secondary | ICD-10-CM | POA: Diagnosis not present

## 2016-07-21 DIAGNOSIS — Z0000 Encounter for general adult medical examination without abnormal findings: Secondary | ICD-10-CM

## 2016-07-21 DIAGNOSIS — Z7189 Other specified counseling: Secondary | ICD-10-CM | POA: Diagnosis not present

## 2016-07-21 DIAGNOSIS — R339 Retention of urine, unspecified: Secondary | ICD-10-CM | POA: Diagnosis not present

## 2016-07-21 DIAGNOSIS — Z125 Encounter for screening for malignant neoplasm of prostate: Secondary | ICD-10-CM | POA: Diagnosis not present

## 2016-07-21 LAB — URINALYSIS, ROUTINE W REFLEX MICROSCOPIC
BILIRUBIN UA: NEGATIVE
Glucose, UA: NEGATIVE
KETONES UA: NEGATIVE
LEUKOCYTES UA: NEGATIVE
Nitrite, UA: NEGATIVE
PH UA: 5.5 (ref 5.0–7.5)
PROTEIN UA: NEGATIVE
RBC UA: NEGATIVE
Specific Gravity, UA: 1.01 (ref 1.005–1.030)
Urobilinogen, Ur: 0.2 mg/dL (ref 0.2–1.0)

## 2016-07-21 LAB — MICROSCOPIC EXAMINATION: BACTERIA UA: NONE SEEN

## 2016-07-21 MED ORDER — DONEPEZIL HCL 5 MG PO TBDP: 5.0000 mg | ORAL_TABLET | Freq: Every day | ORAL | 12 refills | Status: DC

## 2016-07-21 MED ORDER — GABAPENTIN 300 MG PO CAPS: 300.0000 mg | ORAL_CAPSULE | Freq: Two times a day (BID) | ORAL | 12 refills | Status: DC

## 2016-07-21 NOTE — Assessment & Plan Note (Addendum)
A voluntary discussion about advance care planning including the explanation and discussion of advance directives was extensively discussed  with the patient.  Explanation about the health care proxy and Living will was reviewed and packet with forms with explanation of how to fill them out was given.  Pt has completed forms.

## 2016-07-21 NOTE — Assessment & Plan Note (Signed)
The current medical regimen is effective;  continue present plan and medications.  

## 2016-07-21 NOTE — Progress Notes (Signed)
BP (!) 144/78   Pulse 62   Ht 5' 9.75" (1.772 m)   Wt 180 lb (81.6 kg)   SpO2 97%   BMI 26.01 kg/m    Subjective:    Patient ID: Jason Azucena Fallen., male    DOB: Jan 19, 1929, 83 y.o.   MRN: [id number]  HPI: Jason W Duriel Deery. is a 83 y.o. male  Chief Complaint  Patient presents with  . Annual Exam  AWV metrics met Patient with a lots of medical issues going on which were reviewed including coronary artery disease tremor and neurologic conditions followed by neurology. Patient's primary concern today is some leg weakness which is been ongoing without really any change doesn't use a cane discussed risks benefits of using a cane. Patient indicates he will give it consideration. Taking Aricept without problems seems to be doing okay. Relevant past medical, surgical, family and social history reviewed and updated as indicated. Interim medical history since our last visit reviewed. Allergies and medications reviewed and updated.  Review of Systems  Constitutional: Negative.   HENT: Negative.   Eyes: Negative.   Respiratory: Negative.   Cardiovascular: Negative.   Gastrointestinal: Negative.   Endocrine: Negative.   Genitourinary: Negative.   Musculoskeletal: Negative.   Skin: Negative.   Allergic/Immunologic: Negative.   Neurological: Negative.   Hematological: Negative.   Psychiatric/Behavioral: Negative.     Per HPI unless specifically indicated above     Objective:    BP (!) 144/78   Pulse 62   Ht 5' 9.75" (1.772 m)   Wt 180 lb (81.6 kg)   SpO2 97%   BMI 26.01 kg/m   Wt Readings from Last 3 Encounters:  07/21/16 180 lb (81.6 kg)  05/19/16 180 lb (81.6 kg)  04/23/16 181 lb (82.1 kg)    Physical Exam  Constitutional: He is oriented to person, place, and time. He appears well-developed and well-nourished.  HENT:  Head: Normocephalic and atraumatic.  Right Ear: External ear normal.  Left Ear: External ear normal.  Nose: Nose normal.  Eyes:  Conjunctivae and EOM are normal. Pupils are equal, round, and reactive to light.  Neck: Normal range of motion. Neck supple. No thyromegaly present.  Cardiovascular: Normal rate, regular rhythm, normal heart sounds and intact distal pulses.   Pulmonary/Chest: Effort normal and breath sounds normal.  Abdominal: Soft. Bowel sounds are normal. There is no splenomegaly or hepatomegaly.  Genitourinary:  Genitourinary Comments: Deferred pt request  Musculoskeletal: Normal range of motion.  Lymphadenopathy:    He has no cervical adenopathy.  Neurological: He is alert and oriented to person, place, and time. He has normal reflexes.  Skin: Skin is warm and dry. No erythema.  Psychiatric: He has a normal mood and affect. His behavior is normal. Judgment and thought content normal.    Results for orders placed or performed during the hospital encounter of 28/36/62  Basic metabolic panel  Result Value Ref Range   Sodium 139 135 - 145 mmol/L   Potassium 4.0 3.5 - 5.1 mmol/L   Chloride 106 101 - 111 mmol/L   CO2 27 22 - 32 mmol/L   Glucose, Bld 94 65 - 99 mg/dL   BUN 21 (H) 6 - 20 mg/dL   Creatinine, Ser 1.55 (H) 0.61 - 1.24 mg/dL   Calcium 9.5 8.9 - 10.3 mg/dL   GFR calc non Af Amer 39 (L) >60 mL/min   GFR calc Af Amer 45 (L) >60 mL/min   Anion gap 6 5 -  15  CBC  Result Value Ref Range   WBC 7.5 3.8 - 10.6 K/uL   RBC 5.14 4.40 - 5.90 MIL/uL   Hemoglobin 14.7 13.0 - 18.0 g/dL   HCT 45.2 40.0 - 52.0 %   MCV 88.1 80.0 - 100.0 fL   MCH 28.7 26.0 - 34.0 pg   MCHC 32.6 32.0 - 36.0 g/dL   RDW 15.1 (H) 11.5 - 14.5 %   Platelets 228 150 - 440 K/uL  Troponin I  Result Value Ref Range   Troponin I <0.03 <0.03 ng/mL  Troponin I  Result Value Ref Range   Troponin I <0.03 <0.03 ng/mL      Assessment & Plan:   Problem List Items Addressed This Visit      Cardiovascular and Mediastinum   Hypertension    The current medical regimen is effective;  continue present plan and medications.         Relevant Orders   Comprehensive metabolic panel   Lipid panel   Coronary artery disease   Relevant Orders   Comprehensive metabolic panel   Lipid panel     Nervous and Auditory   Dementia    The current medical regimen is effective;  continue present plan and medications.       Relevant Medications   donepezil (ARICEPT ODT) 5 MG disintegrating tablet   gabapentin (NEURONTIN) 300 MG capsule     Genitourinary   Renal failure, acute on chronic (HCC)    Stable labs pending      Relevant Orders   Comprehensive metabolic panel     Other   Urinary retention with incomplete bladder emptying   Relevant Orders   Urinalysis, Routine w reflex microscopic   Advanced care planning/counseling discussion    A voluntary discussion about advance care planning including the explanation and discussion of advance directives was extensively discussed  with the patient.  Explanation about the health care proxy and Living will was reviewed and packet with forms with explanation of how to fill them out was given.  Pt has completed forms.        Other Visit Diagnoses    Annual physical exam    -  Primary   Relevant Orders   CBC with Differential/Platelet   Thyroid disorder screen       Relevant Orders   TSH       Follow up plan: Return in about 6 months (around 01/21/2017) for BMP.

## 2016-07-21 NOTE — Assessment & Plan Note (Signed)
Stable;  labs pending

## 2016-07-22 ENCOUNTER — Encounter: Payer: Self-pay | Admitting: Family Medicine

## 2016-07-22 LAB — PSA: PROSTATE SPECIFIC AG, SERUM: 0.5 ng/mL (ref 0.0–4.0)

## 2016-07-23 LAB — CBC WITH DIFFERENTIAL/PLATELET
BASOS: 1 %
Basophils Absolute: 0.1 10*3/uL (ref 0.0–0.2)
EOS (ABSOLUTE): 0.1 10*3/uL (ref 0.0–0.4)
Eos: 2 %
Hematocrit: 43 % (ref 37.5–51.0)
Hemoglobin: 14.1 g/dL (ref 13.0–17.7)
Immature Grans (Abs): 0 10*3/uL (ref 0.0–0.1)
Immature Granulocytes: 0 %
LYMPHS ABS: 1.5 10*3/uL (ref 0.7–3.1)
Lymphs: 27 %
MCH: 28.7 pg (ref 26.6–33.0)
MCHC: 32.8 g/dL (ref 31.5–35.7)
MCV: 88 fL (ref 79–97)
MONOS ABS: 0.4 10*3/uL (ref 0.1–0.9)
Monocytes: 8 %
NEUTROS ABS: 3.5 10*3/uL (ref 1.4–7.0)
Neutrophils: 62 %
PLATELETS: 232 10*3/uL (ref 150–379)
RBC: 4.91 x10E6/uL (ref 4.14–5.80)
RDW: 14.3 % (ref 12.3–15.4)
WBC: 5.6 10*3/uL (ref 3.4–10.8)

## 2016-07-23 LAB — LIPID PANEL
Chol/HDL Ratio: 4.4 ratio (ref 0.0–5.0)
Cholesterol, Total: 157 mg/dL (ref 100–199)
HDL: 36 mg/dL — ABNORMAL LOW (ref >39)
LDL Calculated: 99 mg/dL (ref 0–99)
Triglycerides: 109 mg/dL (ref 0–149)
VLDL Cholesterol Cal: 22 mg/dL (ref 5–40)

## 2016-07-23 LAB — COMPREHENSIVE METABOLIC PANEL
A/G RATIO: 1.5 (ref 1.2–2.2)
ALT: 16 IU/L (ref 0–44)
AST: 20 IU/L (ref 0–40)
Albumin: 3.9 g/dL (ref 3.5–4.7)
Alkaline Phosphatase: 97 IU/L (ref 39–117)
BILIRUBIN TOTAL: 0.8 mg/dL (ref 0.0–1.2)
BUN/Creatinine Ratio: 14 (ref 10–24)
BUN: 20 mg/dL (ref 8–27)
CHLORIDE: 104 mmol/L (ref 96–106)
CO2: 25 mmol/L (ref 18–29)
Calcium: 9.2 mg/dL (ref 8.6–10.2)
Creatinine, Ser: 1.48 mg/dL — ABNORMAL HIGH (ref 0.76–1.27)
GFR calc non Af Amer: 43 mL/min/{1.73_m2} — ABNORMAL LOW (ref >59)
GFR, EST AFRICAN AMERICAN: 49 mL/min/{1.73_m2} — AB (ref >59)
Globulin, Total: 2.6 g/dL (ref 1.5–4.5)
Glucose: 81 mg/dL (ref 65–99)
POTASSIUM: 4.9 mmol/L (ref 3.5–5.2)
Sodium: 141 mmol/L (ref 134–144)
Total Protein: 6.5 g/dL (ref 6.0–8.5)

## 2016-07-23 LAB — TSH: TSH: 0.753 u[IU]/mL (ref 0.450–4.500)

## 2016-08-16 ENCOUNTER — Other Ambulatory Visit: Payer: Self-pay | Admitting: Family Medicine

## 2016-09-07 DIAGNOSIS — R262 Difficulty in walking, not elsewhere classified: Secondary | ICD-10-CM | POA: Diagnosis not present

## 2016-09-07 DIAGNOSIS — R251 Tremor, unspecified: Secondary | ICD-10-CM | POA: Diagnosis not present

## 2016-09-07 DIAGNOSIS — R42 Dizziness and giddiness: Secondary | ICD-10-CM | POA: Diagnosis not present

## 2016-10-13 ENCOUNTER — Ambulatory Visit: Payer: Medicare HMO

## 2016-10-16 ENCOUNTER — Ambulatory Visit: Payer: Medicare HMO

## 2016-10-20 ENCOUNTER — Ambulatory Visit: Payer: Medicare HMO

## 2016-10-22 ENCOUNTER — Ambulatory Visit: Payer: Medicare HMO

## 2016-10-27 ENCOUNTER — Ambulatory Visit: Payer: Medicare HMO

## 2016-10-29 ENCOUNTER — Ambulatory Visit: Payer: Medicare HMO

## 2016-10-30 DIAGNOSIS — H43813 Vitreous degeneration, bilateral: Secondary | ICD-10-CM | POA: Diagnosis not present

## 2016-11-01 ENCOUNTER — Other Ambulatory Visit: Payer: Self-pay | Admitting: Family Medicine

## 2016-11-03 ENCOUNTER — Ambulatory Visit: Payer: Medicare HMO

## 2016-11-05 ENCOUNTER — Ambulatory Visit: Payer: PRIVATE HEALTH INSURANCE

## 2016-11-09 DIAGNOSIS — E782 Mixed hyperlipidemia: Secondary | ICD-10-CM | POA: Diagnosis not present

## 2016-11-09 DIAGNOSIS — I25810 Atherosclerosis of coronary artery bypass graft(s) without angina pectoris: Secondary | ICD-10-CM | POA: Diagnosis not present

## 2016-11-09 DIAGNOSIS — G4733 Obstructive sleep apnea (adult) (pediatric): Secondary | ICD-10-CM | POA: Diagnosis not present

## 2016-11-12 ENCOUNTER — Ambulatory Visit: Payer: PRIVATE HEALTH INSURANCE

## 2016-11-17 ENCOUNTER — Ambulatory Visit: Payer: PRIVATE HEALTH INSURANCE

## 2016-11-19 ENCOUNTER — Ambulatory Visit: Payer: PRIVATE HEALTH INSURANCE

## 2017-01-26 ENCOUNTER — Encounter: Payer: Self-pay | Admitting: Family Medicine

## 2017-01-26 ENCOUNTER — Ambulatory Visit (INDEPENDENT_AMBULATORY_CARE_PROVIDER_SITE_OTHER): Payer: Medicare HMO | Admitting: Family Medicine

## 2017-01-26 VITALS — BP 110/70 | HR 82 | Temp 98.2°F | Wt 173.0 lb

## 2017-01-26 DIAGNOSIS — R8290 Unspecified abnormal findings in urine: Secondary | ICD-10-CM

## 2017-01-26 DIAGNOSIS — I10 Essential (primary) hypertension: Secondary | ICD-10-CM

## 2017-01-26 LAB — MICROSCOPIC EXAMINATION: Bacteria, UA: NONE SEEN

## 2017-01-26 LAB — URINALYSIS, ROUTINE W REFLEX MICROSCOPIC
BILIRUBIN UA: NEGATIVE
Glucose, UA: NEGATIVE
KETONES UA: NEGATIVE
NITRITE UA: NEGATIVE
PH UA: 5.5 (ref 5.0–7.5)
SPEC GRAV UA: 1.025 (ref 1.005–1.030)
UUROB: 1 mg/dL (ref 0.2–1.0)

## 2017-01-26 NOTE — Progress Notes (Signed)
BP 110/70 (BP Location: Left Arm)   Pulse 82   Temp 98.2 F (36.8 C) (Oral)   Wt 173 lb (78.5 kg)   SpO2 98%   BMI 25.00 kg/m    Subjective:    Patient ID: Jason Cross., male    DOB: 05/30/1928, 83 y.o.   MRN: [id number]  HPI: Safwan W Tayler Lassen. is a 83 y.o. male  Chief Complaint  Patient presents with  . Follow-up  . URI  . Urinary Retention  . Headache    Sore on head  patient here with his wife who assists with history.Has multiple issues Some urinary retention with urinating then 30 seconds or so later maybe a tablespoon ofurine coming out again. His urine also has a foul  Smell Patient 4 days ago had some bilateral weakness  Fumbling with his buttons which persists to today.Had some slurred speech which cleared up. His wife wanted to go to the emergency room and patient refused. After a nap patient woke up a little better and is gradually improved with no further weakness  Of his legs. But continued fumbling. Memory doesn't seem to change.   Relevant past medical, surgical, family and social history reviewed and updated as indicated. Interim medical history since our last visit reviewed. Allergies and medications reviewed and updated.  Review of Systems  Constitutional: Negative for chills, diaphoresis, fatigue and fever.  Respiratory: Negative.   Cardiovascular: Negative.     Per HPI unless specifically indicated above     Objective:    BP 110/70 (BP Location: Left Arm)   Pulse 82   Temp 98.2 F (36.8 C) (Oral)   Wt 173 lb (78.5 kg)   SpO2 98%   BMI 25.00 kg/m   Wt Readings from Last 3 Encounters:  01/26/17 173 lb (78.5 kg)  07/21/16 180 lb (81.6 kg)  05/19/16 180 lb (81.6 kg)    Physical Exam  Constitutional: He is oriented to person, place, and time. He appears well-developed and well-nourished.  HENT:  Head: Normocephalic and atraumatic.  Eyes: Conjunctivae and EOM are normal.  Neck: Normal range of motion.  Cardiovascular: Normal  rate, regular rhythm and normal heart sounds.  Pulmonary/Chest: Effort normal and breath sounds normal.  Musculoskeletal: Normal range of motion.  Neurological: He is alert and oriented to person, place, and time.  Skin: No erythema.  Psychiatric: He has a normal mood and affect. His behavior is normal.    Results for orders placed or performed in visit on 07/21/16  Microscopic Examination  Result Value Ref Range   WBC, UA 0-5 0 - 5 /hpf   RBC, UA 0-2 0 - 2 /hpf   Epithelial Cells (non renal) CANCELED    Bacteria, UA None seen None seen/Few  CBC with Differential/Platelet  Result Value Ref Range   WBC 5.6 3.4 - 10.8 x10E3/uL   RBC 4.91 4.14 - 5.80 x10E6/uL   Hemoglobin 14.1 13.0 - 17.7 g/dL   Hematocrit 43.0 37.5 - 51.0 %   MCV 88 79 - 97 fL   MCH 28.7 26.6 - 33.0 pg   MCHC 32.8 31.5 - 35.7 g/dL   RDW 14.3 12.3 - 15.4 %   Platelets 232 150 - 379 x10E3/uL   Neutrophils 62 Not Estab. %   Lymphs 27 Not Estab. %   Monocytes 8 Not Estab. %   Eos 2 Not Estab. %   Basos 1 Not Estab. %   Neutrophils Absolute 3.5 1.4 - 7.0 x10E3/uL  Lymphocytes Absolute 1.5 0.7 - 3.1 x10E3/uL   Monocytes Absolute 0.4 0.1 - 0.9 x10E3/uL   EOS (ABSOLUTE) 0.1 0.0 - 0.4 x10E3/uL   Basophils Absolute 0.1 0.0 - 0.2 x10E3/uL   Immature Granulocytes 0 Not Estab. %   Immature Grans (Abs) 0.0 0.0 - 0.1 x10E3/uL  Comprehensive metabolic panel  Result Value Ref Range   Glucose 81 65 - 99 mg/dL   BUN 20 8 - 27 mg/dL   Creatinine, Ser 1.48 (H) 0.76 - 1.27 mg/dL   GFR calc non Af Amer 43 (L) >59 mL/min/1.73   GFR calc Af Amer 49 (L) >59 mL/min/1.73   BUN/Creatinine Ratio 14 10 - 24   Sodium 141 134 - 144 mmol/L   Potassium 4.9 3.5 - 5.2 mmol/L   Chloride 104 96 - 106 mmol/L   CO2 25 18 - 29 mmol/L   Calcium 9.2 8.6 - 10.2 mg/dL   Total Protein 6.5 6.0 - 8.5 g/dL   Albumin 3.9 3.5 - 4.7 g/dL   Globulin, Total 2.6 1.5 - 4.5 g/dL   Albumin/Globulin Ratio 1.5 1.2 - 2.2   Bilirubin Total 0.8 0.0 - 1.2  mg/dL   Alkaline Phosphatase 97 39 - 117 IU/L   AST 20 0 - 40 IU/L   ALT 16 0 - 44 IU/L  Lipid panel  Result Value Ref Range   Cholesterol, Total 157 100 - 199 mg/dL   Triglycerides 109 0 - 149 mg/dL   HDL 36 (L) >39 mg/dL   VLDL Cholesterol Cal 22 5 - 40 mg/dL   LDL Calculated 99 0 - 99 mg/dL   Chol/HDL Ratio 4.4 0.0 - 5.0 ratio  TSH  Result Value Ref Range   TSH 0.753 0.450 - 4.500 uIU/mL  Urinalysis, Routine w reflex microscopic  Result Value Ref Range   Specific Gravity, UA 1.010 1.005 - 1.030   pH, UA 5.5 5.0 - 7.5   Color, UA Yellow Yellow   Appearance Ur Clear Clear   Leukocytes, UA Negative Negative   Protein, UA Negative Negative/Trace   Glucose, UA Negative Negative   Ketones, UA Negative Negative   RBC, UA Negative Negative   Bilirubin, UA Negative Negative   Urobilinogen, Ur 0.2 0.2 - 1.0 mg/dL   Nitrite, UA Negative Negative   Microscopic Examination See below:   PSA  Result Value Ref Range   Prostate Specific Ag, Serum 0.5 0.0 - 4.0 ng/mL      Assessment & Plan:   Problem List Items Addressed This Visit      Cardiovascular and Mediastinum   Hypertension - Primary   Relevant Orders   Basic metabolic panel   CBC with Differential/Platelet   EKG 12-Lead (Completed)    Other Visit Diagnoses    Abnormal urine odor       Relevant Orders   Urinalysis, Routine w reflex microscopic     reviewed urinalysis normal except for very concentrated urine. Patient's symptoms may be related to dehydration and low blood pressure. EKG was normal with 1 PAC. Discussed hydration and salt use something like Gatorade. We'll observe symptoms.. Will amend pending blood work reports.  Follow up plan: Return if symptoms worsen or fail to improve, for As scheduled.

## 2017-01-27 ENCOUNTER — Encounter: Payer: Self-pay | Admitting: Family Medicine

## 2017-01-27 LAB — CBC WITH DIFFERENTIAL/PLATELET
BASOS ABS: 0 10*3/uL (ref 0.0–0.2)
Basos: 0 %
EOS (ABSOLUTE): 0.1 10*3/uL (ref 0.0–0.4)
Eos: 1 %
HEMOGLOBIN: 13.2 g/dL (ref 13.0–17.7)
Hematocrit: 40.8 % (ref 37.5–51.0)
Immature Grans (Abs): 0 10*3/uL (ref 0.0–0.1)
Immature Granulocytes: 0 %
LYMPHS ABS: 1.8 10*3/uL (ref 0.7–3.1)
Lymphs: 16 %
MCH: 28.9 pg (ref 26.6–33.0)
MCHC: 32.4 g/dL (ref 31.5–35.7)
MCV: 89 fL (ref 79–97)
Monocytes Absolute: 0.8 10*3/uL (ref 0.1–0.9)
Monocytes: 7 %
NEUTROS ABS: 8.9 10*3/uL — AB (ref 1.4–7.0)
Neutrophils: 76 %
Platelets: 338 10*3/uL (ref 150–379)
RBC: 4.57 x10E6/uL (ref 4.14–5.80)
RDW: 14.1 % (ref 12.3–15.4)
WBC: 11.7 10*3/uL — ABNORMAL HIGH (ref 3.4–10.8)

## 2017-01-27 LAB — BASIC METABOLIC PANEL
BUN / CREAT RATIO: 16 (ref 10–24)
BUN: 24 mg/dL (ref 8–27)
CALCIUM: 9.4 mg/dL (ref 8.6–10.2)
CHLORIDE: 103 mmol/L (ref 96–106)
CO2: 22 mmol/L (ref 20–29)
CREATININE: 1.51 mg/dL — AB (ref 0.76–1.27)
GFR calc non Af Amer: 41 mL/min/{1.73_m2} — ABNORMAL LOW (ref >59)
GFR, EST AFRICAN AMERICAN: 48 mL/min/{1.73_m2} — AB (ref >59)
GLUCOSE: 102 mg/dL — AB (ref 65–99)
POTASSIUM: 4.2 mmol/L (ref 3.5–5.2)
Sodium: 143 mmol/L (ref 134–144)

## 2017-02-04 ENCOUNTER — Ambulatory Visit (INDEPENDENT_AMBULATORY_CARE_PROVIDER_SITE_OTHER): Payer: Medicare HMO | Admitting: Family Medicine

## 2017-02-04 ENCOUNTER — Ambulatory Visit: Payer: Self-pay | Admitting: *Deleted

## 2017-02-04 ENCOUNTER — Encounter: Payer: Self-pay | Admitting: Family Medicine

## 2017-02-04 VITALS — BP 170/91 | HR 74 | Temp 98.1°F | Wt 174.0 lb

## 2017-02-04 DIAGNOSIS — I10 Essential (primary) hypertension: Secondary | ICD-10-CM | POA: Diagnosis not present

## 2017-02-04 DIAGNOSIS — J019 Acute sinusitis, unspecified: Secondary | ICD-10-CM

## 2017-02-04 MED ORDER — AMOXICILLIN-POT CLAVULANATE 875-125 MG PO TABS: 1.0000 | ORAL_TABLET | Freq: Two times a day (BID) | ORAL | 0 refills | Status: DC

## 2017-02-04 NOTE — Progress Notes (Signed)
BP (!) 170/91   Pulse 74   Temp 98.1 F (36.7 C) (Oral)   Wt 174 lb (78.9 kg)   SpO2 99%   BMI 25.15 kg/m    Subjective:    Patient ID: Jason Azucena Fallen., male    DOB: 1928-03-09, 83 y.o.   MRN: [id number]  HPI: Jason W Vonn Sliger. is a 83 y.o. male  Chief Complaint  Patient presents with  . URI  . Shaking  . Dehydration  atient accompanied by his wife who assists with history Patient with URI congestion drainage cold symptoms getting worse over this last week and a half to 2 weekss had some disequilibrium and even fell this morning with difficulty getting up.Patient hasn't been eating very well due to some nausea and drainage with slight cough. Patient's had some low-grade fever. On review  No chest pain chest tightness  Or other cardiovascular type symptoms no palpitations. Reviewed falling spell with patient and was more a stagger fall no real syncopal or seizure type activities. Has been taking some Mucinex and Tylenol.  Relevant past medical, surgical, family and social history reviewed and updated as indicated. Interim medical history since our last visit reviewed. Allergies and medications reviewed and updated.  Review of Systems  Constitutional: Positive for appetite change, chills, diaphoresis, fatigue and fever.  HENT: Positive for congestion, postnasal drip, rhinorrhea, sinus pressure and sinus pain.   Respiratory: Positive for cough. Negative for apnea and chest tightness.   Cardiovascular: Negative for chest pain, palpitations and leg swelling.    Per HPI unless specifically indicated above     Objective:    BP (!) 170/91   Pulse 74   Temp 98.1 F (36.7 C) (Oral)   Wt 174 lb (78.9 kg)   SpO2 99%   BMI 25.15 kg/m   Wt Readings from Last 3 Encounters:  02/04/17 174 lb (78.9 kg)  01/26/17 173 lb (78.5 kg)  07/21/16 180 lb (81.6 kg)    Physical Exam  Constitutional: He is oriented to person, place, and time. He appears well-developed and  well-nourished.  HENT:  Head: Normocephalic and atraumatic.  Right Ear: External ear normal.  Left Ear: External ear normal.  Mouth/Throat: Oropharyngeal exudate present.  Eyes: Conjunctivae and EOM are normal.  Neck: Normal range of motion.  Cardiovascular: Normal rate, regular rhythm and normal heart sounds.  Pulmonary/Chest: Effort normal and breath sounds normal.  Musculoskeletal: Normal range of motion.  Neurological: He is alert and oriented to person, place, and time.  Skin: No erythema.  Psychiatric: He has a normal mood and affect. His behavior is normal. Judgment and thought content normal.    Results for orders placed or performed in visit on 01/26/17  Microscopic Examination  Result Value Ref Range   WBC, UA 0-5 0 - 5 /hpf   RBC, UA 0-2 0 - 2 /hpf   Epithelial Cells (non renal) 0-10 0 - 10 /hpf   Casts Present None seen /lpf   Cast Type Granular casts (A) N/A   Crystals Present N/A   Crystal Type Calcium Oxalate N/A   Mucus, UA Present Not Estab.   Bacteria, UA None seen None seen/Few  Basic metabolic panel  Result Value Ref Range   Glucose 102 (H) 65 - 99 mg/dL   BUN 24 8 - 27 mg/dL   Creatinine, Ser 1.51 (H) 0.76 - 1.27 mg/dL   GFR calc non Af Amer 41 (L) >59 mL/min/1.73   GFR calc Af Amer 48 (  L) >59 mL/min/1.73   BUN/Creatinine Ratio 16 10 - 24   Sodium 143 134 - 144 mmol/L   Potassium 4.2 3.5 - 5.2 mmol/L   Chloride 103 96 - 106 mmol/L   CO2 22 20 - 29 mmol/L   Calcium 9.4 8.6 - 10.2 mg/dL  Urinalysis, Routine w reflex microscopic  Result Value Ref Range   Specific Gravity, UA 1.025 1.005 - 1.030   pH, UA 5.5 5.0 - 7.5   Color, UA Yellow Yellow   Appearance Ur Hazy (A) Clear   Leukocytes, UA Trace (A) Negative   Protein, UA 1+ (A) Negative/Trace   Glucose, UA Negative Negative   Ketones, UA Negative Negative   RBC, UA Trace (A) Negative   Bilirubin, UA Negative Negative   Urobilinogen, Ur 1.0 0.2 - 1.0 mg/dL   Nitrite, UA Negative Negative    Microscopic Examination See below:   CBC with Differential/Platelet  Result Value Ref Range   WBC 11.7 (H) 3.4 - 10.8 x10E3/uL   RBC 4.57 4.14 - 5.80 x10E6/uL   Hemoglobin 13.2 13.0 - 17.7 g/dL   Hematocrit 40.8 37.5 - 51.0 %   MCV 89 79 - 97 fL   MCH 28.9 26.6 - 33.0 pg   MCHC 32.4 31.5 - 35.7 g/dL   RDW 14.1 12.3 - 15.4 %   Platelets 338 150 - 379 x10E3/uL   Neutrophils 76 Not Estab. %   Lymphs 16 Not Estab. %   Monocytes 7 Not Estab. %   Eos 1 Not Estab. %   Basos 0 Not Estab. %   Neutrophils Absolute 8.9 (H) 1.4 - 7.0 x10E3/uL   Lymphocytes Absolute 1.8 0.7 - 3.1 x10E3/uL   Monocytes Absolute 0.8 0.1 - 0.9 x10E3/uL   EOS (ABSOLUTE) 0.1 0.0 - 0.4 x10E3/uL   Basophils Absolute 0.0 0.0 - 0.2 x10E3/uL   Immature Granulocytes 0 Not Estab. %   Immature Grans (Abs) 0.0 0.0 - 0.1 x10E3/uL      Assessment & Plan:   Problem List Items Addressed This Visit      Cardiovascular and Mediastinum   Hypertension    Blood pressure elevated but patient does better according to wife with higher blood pressures       Other Visit Diagnoses    Acute sinusitis, recurrence not specified, unspecified location    -  Primary   Relevant Medications   amoxicillin-clavulanate (AUGMENTIN) 875-125 MG tablet     reviewed sinusitis care and treatment with use of Augmentin and cautions about side effects and prevention.  Also use Mucinex Tylenol etc.Discuss worsening symptoms change in symptoms,, to go to the emergency room.  Follow up plan: Return if symptoms worsen or fail to improve, for As scheduled.

## 2017-02-04 NOTE — Assessment & Plan Note (Signed)
Blood pressure elevated but patient does better according to wife with higher blood pressures

## 2017-02-04 NOTE — Telephone Encounter (Signed)
Pt  Was   Seen  By  Dr  Jeananne Rama   12/4 /2018    Had  Tests  Done  .  Pt continues  To  Be   Weak   With    Decreased  Fluid  Intake    Cough   And  Shortness of breath  With  Exertion.  Wife  Is giving  Information   And  Patient is  In  Background .  Pts  Wife  Wishes    Pt to be  Seen  At  Dr Jeananne Rama office   . Office  Called   Spoke  With  Christian  Who  Made  An appt today  With dr  Jeananne Rama  Today at  [facility]  . Wife  Was  Advised  To call  [phone]  If  Any   Worse  Before  The  appt. Reason for Disposition . [1] MODERATE weakness (i.e., interferes with work, school, normal activities) AND [2] cause unknown  (Exceptions: weakness with acute minor illness, or weakness from poor fluid intake) . [1] MODERATE weakness (i.e., interferes with work, school, normal activities) AND [2] persists > 3 days  Answer Assessment - Initial Assessment Questions 1. DESCRIPTION: "Describe how you are feeling."      Shakey   fair 2. SEVERITY: "How bad is it?"  "Can you stand and walk?"   - MILD - Feels weak or tired, but does not interfere with work, school or normal activities   - [place] to stand and walk; weakness interferes with work, school, or normal activities   - SEVERE - Unable to stand or walk     Moderate 3. ONSET:  "When did the weakness begin?"      2  Weeks   4. CAUSE: "What do you think is causing the weakness?"       Possibly  dehydrated     Urine  Is  Dark    And   Smells   5. MEDICINES: "Have you recently started a new medicine or had a change in the amount of a medicine?"        none 6. OTHER SYMPTOMS: "Do you have any other symptoms?" (e.g., chest pain, fever, cough, SOB, vomiting, diarrhea, bleeding)      Cough      Denies  Any  Pain   7. PREGNANCY: "Is there any chance you are pregnant?" "When was your last menstrual period?"     n/a  Protocols used: WEAKNESS (GENERALIZED) AND FATIGUE-A-AH

## 2017-05-05 DIAGNOSIS — I25810 Atherosclerosis of coronary artery bypass graft(s) without angina pectoris: Secondary | ICD-10-CM | POA: Diagnosis not present

## 2017-05-05 DIAGNOSIS — G4733 Obstructive sleep apnea (adult) (pediatric): Secondary | ICD-10-CM | POA: Diagnosis not present

## 2017-05-05 DIAGNOSIS — E782 Mixed hyperlipidemia: Secondary | ICD-10-CM | POA: Diagnosis not present

## 2017-05-31 DIAGNOSIS — I252 Old myocardial infarction: Secondary | ICD-10-CM | POA: Diagnosis not present

## 2017-05-31 DIAGNOSIS — R69 Illness, unspecified: Secondary | ICD-10-CM | POA: Diagnosis not present

## 2017-05-31 DIAGNOSIS — Z7982 Long term (current) use of aspirin: Secondary | ICD-10-CM | POA: Diagnosis not present

## 2017-05-31 DIAGNOSIS — I2510 Atherosclerotic heart disease of native coronary artery without angina pectoris: Secondary | ICD-10-CM | POA: Diagnosis not present

## 2017-05-31 DIAGNOSIS — G4730 Sleep apnea, unspecified: Secondary | ICD-10-CM | POA: Diagnosis not present

## 2017-05-31 DIAGNOSIS — K219 Gastro-esophageal reflux disease without esophagitis: Secondary | ICD-10-CM | POA: Diagnosis not present

## 2017-05-31 DIAGNOSIS — G629 Polyneuropathy, unspecified: Secondary | ICD-10-CM | POA: Diagnosis not present

## 2017-05-31 DIAGNOSIS — Z8249 Family history of ischemic heart disease and other diseases of the circulatory system: Secondary | ICD-10-CM | POA: Diagnosis not present

## 2017-05-31 DIAGNOSIS — G8929 Other chronic pain: Secondary | ICD-10-CM | POA: Diagnosis not present

## 2017-05-31 DIAGNOSIS — Z803 Family history of malignant neoplasm of breast: Secondary | ICD-10-CM | POA: Diagnosis not present

## 2017-06-11 ENCOUNTER — Telehealth: Payer: Self-pay | Admitting: Family Medicine

## 2017-06-11 NOTE — Telephone Encounter (Signed)
Copied from [id number] [phone] [id number]. Topic: Medicare AWV >> Jun 11, 2017 12:32 PM Leo Rod wrote: Called to schedule Medicare Annual Wellness Visit with Nurse Health Advisor. If patient returns call please note: their last AWV was on 07/17/15 please schedule AWV with [facility] any date   Thank you! For any questions please contact: Jill Alexanders [phone]  Skype [email or web address]

## 2017-06-21 ENCOUNTER — Telehealth: Payer: Self-pay | Admitting: Family Medicine

## 2017-06-21 DIAGNOSIS — Z85828 Personal history of other malignant neoplasm of skin: Secondary | ICD-10-CM | POA: Diagnosis not present

## 2017-06-21 DIAGNOSIS — D0422 Carcinoma in situ of skin of left ear and external auricular canal: Secondary | ICD-10-CM | POA: Diagnosis not present

## 2017-06-21 DIAGNOSIS — D485 Neoplasm of uncertain behavior of skin: Secondary | ICD-10-CM | POA: Diagnosis not present

## 2017-06-21 DIAGNOSIS — L821 Other seborrheic keratosis: Secondary | ICD-10-CM | POA: Diagnosis not present

## 2017-06-21 DIAGNOSIS — L578 Other skin changes due to chronic exposure to nonionizing radiation: Secondary | ICD-10-CM | POA: Diagnosis not present

## 2017-06-21 NOTE — Telephone Encounter (Signed)
Called and spoke with wife. She stated that she felt the patient was slurring his words. She stated she thought he would be fine to keep the June appointment instead of coming in earlier, unless we thought necessary. Will allow provider to decide if June is okay. Wife stated if she noticed it worsening she would call back to schedule immediately. Please advise.

## 2017-06-21 NOTE — Telephone Encounter (Signed)
ok 

## 2017-06-21 NOTE — Telephone Encounter (Signed)
Spoke with wife to sched pt for awv with Tiffany, she wanted to sched a follow up appt with Dr. Jeananne Rama because she said she has noticed some differences in him lately with responding concerned he may have had a stroke. Jason Cross will reach out to see if pt can be seen sooner.  Thank you, knb

## 2017-06-21 NOTE — Telephone Encounter (Signed)
awv scheduled for May

## 2017-06-28 ENCOUNTER — Ambulatory Visit (INDEPENDENT_AMBULATORY_CARE_PROVIDER_SITE_OTHER): Payer: Medicare HMO

## 2017-06-28 VITALS — BP 138/72 | HR 82 | Temp 98.3°F | Resp 15 | Ht 69.0 in | Wt 172.1 lb

## 2017-06-28 DIAGNOSIS — Z0000 Encounter for general adult medical examination without abnormal findings: Secondary | ICD-10-CM | POA: Diagnosis not present

## 2017-06-28 NOTE — Progress Notes (Signed)
Subjective:   Jason Cross. is a 84 y.o. male who presents for Medicare Annual/Subsequent preventive examination.  Review of Systems:   Cardiac Risk Factors include: advanced age (>32men, >26 women);hypertension;dyslipidemia;male gender     Objective:    Vitals: BP 138/72 (BP Location: Left Arm, Patient Position: Sitting)   Pulse 82   Temp 98.3 F (36.8 C) (Temporal)   Resp 15   Ht 5\' 9"  (1.753 m)   Wt 172 lb 1.6 oz (78.1 kg)   SpO2 98%   BMI 25.41 kg/m   Body mass index is 25.41 kg/m.  Advanced Directives 06/28/2017 05/19/2016 05/05/2013 03/10/2013 03/02/2013  Does Patient Have a Medical Advance Directive? No No Patient does not have advance directive;Patient would not like information Patient does not have advance directive Patient does not have advance directive  Would patient like information on creating a medical advance directive? No - Patient declined No - Patient declined - - -  Pre-existing out of facility DNR order (yellow form or pink MOST form) - - No No No    Tobacco Social History   Tobacco Use  Smoking Status Former Smoker  . Years: 40.00  . Types: Cigarettes  . Last attempt to quit: 02/23/1993  . Years since quitting: 24.3  Smokeless Tobacco Never Used     Counseling given: Not Answered   Clinical Intake:  Pre-visit preparation completed: Yes  Pain : No/denies pain     Nutritional Status: BMI 25 -29 Overweight Nutritional Risks: None Diabetes: No  How often do you need to have someone help you when you read instructions, pamphlets, or other written materials from your doctor or pharmacy?: 3 - Sometimes What is the last grade level you completed in school?: 11th grade  Interpreter Needed?: No  Information entered by :: Graysen Depaula,LPN   Past Medical History:  Diagnosis Date  . Arthritis   . Benign prostate hyperplasia   . Cataracts, bilateral   . Coarse tremors    L>R hands  . COPD (chronic obstructive pulmonary disease) ([place])    first stages  . Coronary artery disease   . Dizziness    occasional  . Gastric ulcer    in the 60's  . GERD (gastroesophageal reflux disease)    takes Omeprazole every other day  . Hard of hearing    doesn't wear hearing aids  . History of colon polyps   . History of kidney stones    still one on the left side, low  . Hyperlipidemia    takes Simvastatin daily  . Hypertension    stopped BP meds, BP under control  . Myocardial infarction ([place]) 2009  . Peripheral neuropathy    takes Gabapentin daily  . Pneumonia    hx of in the 60's  . Sleep apnea    Past Surgical History:  Procedure Laterality Date  . BACK SURGERY  2007  . COLONOSCOPY    . CORONARY ARTERY BYPASS GRAFT  2009   LIMA to LAD, SVG to D1 07/2007  . EYE SURGERY Bilateral 2004, 2005   implants  . GREEN LIGHT LASER TURP (TRANSURETHRAL RESECTION OF PROSTATE N/A 05/10/2013   Procedure: GREEN LIGHT LASER TURP (TRANSURETHRAL RESECTION OF PROSTATE;  Surgeon: Ardis Hughs, MD;  Location: [facility];  Service: Urology;  Laterality: N/A;  . lipoma removed     chest wall  . microthermo prostate  2009  . [place]  . PROSTATE SURGERY  10/14  . THORACIC DISCECTOMY  N/A 03/10/2013   Procedure: Thoracic twelve-Lumbar one Laminectomy;  Surgeon: Erline Levine, MD;  Location: [facility];  Service: Neurosurgery;  Laterality: N/A;  Thoracic twelve-Lumbar one Laminectomy  . TONSILLECTOMY     at age 42   Family History  Problem Relation Age of Onset  . Heart disease Father   . Hypertension Father   . Heart attack Father   . Thyroid disease Father   . Cancer Sister        breast   Social History   Socioeconomic History  . Marital status: Married    Spouse name: Not on file  . Number of children: Not on file  . Years of education: Not on file  . Highest education level: Not on file  Occupational History  . Not on file  Social Needs  . Financial resource strain: Not hard at all  . Food insecurity:    Worry:  Never true    Inability: Never true  . Transportation needs:    Medical: No    Non-medical: No  Tobacco Use  . Smoking status: Former Smoker    Years: 40.00    Types: Cigarettes    Last attempt to quit: 02/23/1993    Years since quitting: 24.3  . Smokeless tobacco: Never Used  Substance and Sexual Activity  . Alcohol use: No  . Drug use: No  . Sexual activity: Not on file  Lifestyle  . Physical activity:    Days per week: 0 days    Minutes per session: 0 min  . Stress: Not at all  Relationships  . Social connections:    Talks on phone: Never    Gets together: Once a week    Attends religious service: More than 4 times per year    Active member of club or organization: Yes    Attends meetings of clubs or organizations: More than 4 times per year    Relationship status: Married  Other Topics Concern  . Not on file  Social History Narrative  . Not on file    Outpatient Encounter Medications as of 06/28/2017  Medication Sig  . aspirin EC 81 MG tablet Take 81 mg by mouth every morning.  . donepezil (ARICEPT) 5 MG tablet TAKE 1 TABLET BY MOUTH AT BEDTIME  . gabapentin (NEURONTIN) 300 MG capsule Take 1 capsule (300 mg total) by mouth 2 (two) times daily.  Marland Kitchen ibuprofen (ADVIL,MOTRIN) 200 MG tablet Take 400 mg by mouth every 6 (six) hours as needed for moderate pain.  Marland Kitchen albuterol (PROVENTIL HFA;VENTOLIN HFA) 108 (90 Base) MCG/ACT inhaler Inhale 2 puffs into the lungs every 6 (six) hours as needed for wheezing or shortness of breath. (Patient not taking: Reported on 06/28/2017)  . omeprazole (PRILOSEC OTC) 20 MG tablet Take 10 mg by mouth every other day. As needed  . [DISCONTINUED] amoxicillin-clavulanate (AUGMENTIN) 875-125 MG tablet Take 1 tablet by mouth 2 (two) times daily. (Patient not taking: Reported on 06/28/2017)  . [DISCONTINUED] donepezil (ARICEPT ODT) 5 MG disintegrating tablet Take 1 tablet (5 mg total) by mouth at bedtime. (Patient not taking: Reported on 06/28/2017)   No  facility-administered encounter medications on file as of 06/28/2017.     Activities of Daily Living In your present state of health, do you have any difficulty performing the following activities: 06/28/2017  Hearing? Y  Vision? Y  Difficulty concentrating or making decisions? Y  Walking or climbing stairs? Y  Comment SOB and balance   Dressing or bathing?  Y  Comment needs help buttoning shirts  Doing errands, shopping? Y  Comment wife drives  Conservation officer, nature and eating ? N  Using the Toilet? N  In the past six months, have you accidently leaked urine? N  Do you have problems with loss of bowel control? Y  Comment occasional   Managing your Medications? N  Managing your Finances? N  Housekeeping or managing your Housekeeping? N  Some recent data might be hidden    Patient Care Team: Guadalupe Maple, MD as PCP - General (Family Medicine) Christene Lye, MD (General Surgery) Valerie Roys, DO as Referring Physician (Family Medicine) Corey Skains, MD as Consulting Physician (Cardiology) Ralene Bathe, MD (Dermatology)   Assessment:   This is a routine wellness examination for Jason Cross.  Exercise Activities and Dietary recommendations Current Exercise Habits: The patient does not participate in regular exercise at present, Exercise limited by: None identified  Goals    . DIET - INCREASE WATER INTAKE     Recommend drinking at least 6-8 glasses of water a day        Fall Risk Fall Risk  06/28/2017 02/04/2017 01/26/2017 07/21/2016 07/17/2015  Falls in the past year? Yes Yes No No Yes  Number falls in past yr: 2 or more 1 - - 2 or more  Injury with Fall? No No - - No  Risk Factor Category  High Fall Risk - - - -  Risk for fall due to : Impaired balance/gait History of fall(s) - - -  Follow up Falls prevention discussed Falls evaluation completed - - -   Is the patient's home free of loose throw rugs in walkways, pet beds, electrical cords, etc?   yes      Grab  bars in the bathroom? no      Handrails on the stairs?   yes      Adequate lighting?   yes  Timed Get Up and Go Performed: Completed in 9 seconds with no use of assistive devices, steady gait. Has cane at home for unsteady balance when needed.  No intervention needed at this time.   Depression Screen PHQ 2/9 Scores 06/28/2017 01/26/2017 07/21/2016 07/17/2015  PHQ - 2 Score 1 0 0 0  PHQ- 9 Score 13 - - 4    Cognitive Function     6CIT Screen 06/28/2017  What Year? 0 points  What month? 0 points  What time? 0 points  Count back from 20 0 points  Months in reverse 0 points  Repeat phrase 0 points  Total Score 0    Immunization History  Administered Date(s) Administered  . Pneumococcal Conjugate-13 07/04/2014  . Pneumococcal Polysaccharide-23 12/30/2015  . Zoster 11/15/2006    Qualifies for Shingles Vaccine?  Yes, discussed shingrix vaccine   Screening Tests Health Maintenance  Topic Date Due  . TETANUS/TDAP  10/08/1947  . INFLUENZA VACCINE  09/23/2017  . PNA vac Low Risk Adult  Completed   Cancer Screenings: Lung: Low Dose CT Chest recommended if Age 20-80 years, 30 pack-year currently smoking OR have quit w/in 15years. Patient does not qualify. Colorectal: no longer required  Additional Screenings:  Hepatitis C Screening: not indicated       Plan:    I have personally reviewed and addressed the Medicare Annual Wellness questionnaire and have noted the following in the patient's chart:  A. Medical and social history B. Use of alcohol, tobacco or illicit drugs  C. Current medications and supplements D. Functional  ability and status E.  Nutritional status F.  Physical activity G. Advance directives H. List of other physicians I.  Hospitalizations, surgeries, and ER visits in previous 12 months J.  [place] such as hearing and vision if needed, cognitive and depression L. Referrals and appointments   In addition, I have reviewed and discussed with  patient certain preventive protocols, quality metrics, and best practice recommendations. A written personalized care plan for preventive services as well as general preventive health recommendations were provided to patient.   Signed,  Tyler Aas, LPN Nurse Health Advisor   Nurse Notes: patients wife concerned with cognitive function. - states she feels his memory is not better with the Aricept and he seems to be mumbling his words a lot lately. States it started when she called about a week ago when she thought it could've been a stroke but states it doesn't seem to be slurring but more of him just mumbling. Denies any other symptoms. Discussed stroke symptoms and to call 911 if she noticed any new symptoms and to keep follow up appt with Dr.Crissman in June and to call interm if needed.

## 2017-06-28 NOTE — Patient Instructions (Signed)
Jason Cross , Thank you for taking time to come for your Medicare Wellness Visit. I appreciate your ongoing commitment to your health goals. Please review the following plan we discussed and let me know if I can assist you in the future.   Screening recommendations/referrals: Colonoscopy: no longer required Recommended yearly ophthalmology/optometry visit for glaucoma screening and checkup Recommended yearly dental visit for hygiene and checkup  Vaccinations: Influenza vaccine: due 10/2017 Pneumococcal vaccine: up to date Tdap vaccine: due, check with your insurance company for coverage information Shingles vaccine: shingrix eligible, check with your insurance company for coverage     Advanced directives: Advance directive discussed with you today. Even though you declined this today please call our office should you change your mind and we can give you the proper paperwork for you to fill out.  Conditions/risks identified: Recommend drinking at least 6-8 glasses of water a day   Next appointment: Follow up on 07/28/2017 at 11:00am with Dr.Crissman. Follow up in one year for your annual wellness exam.  Preventive Care 82 Years and Older, Male Preventive care refers to lifestyle choices and visits with your health care provider that can promote health and wellness. What does preventive care include?  A yearly physical exam. This is also called an annual well check.  Dental exams once or twice a year.  Routine eye exams. Ask your health care provider how often you should have your eyes checked.  Personal lifestyle choices, including:  Daily care of your teeth and gums.  Regular physical activity.  Eating a healthy diet.  Avoiding tobacco and drug use.  Limiting alcohol use.  Practicing safe sex.  Taking low doses of aspirin every day.  Taking vitamin and mineral supplements as recommended by your health care provider. What happens during an annual well check? The services  and screenings done by your health care provider during your annual well check will depend on your age, overall health, lifestyle risk factors, and family history of disease. Counseling  Your health care provider may ask you questions about your:  Alcohol use.  Tobacco use.  Drug use.  Emotional well-being.  Home and relationship well-being.  Sexual activity.  Eating habits.  History of falls.  Memory and ability to understand (cognition).  Work and work Statistician. Screening  You may have the following tests or measurements:  Height, weight, and BMI.  Blood pressure.  Lipid and cholesterol levels. These may be checked every 5 years, or more frequently if you are over 82 years old.  Skin check.  Lung cancer screening. You may have this screening every year starting at age 82 if you have a 30-pack-year history of smoking and currently smoke or have quit within the past 15 years.  Fecal occult blood test (FOBT) of the stool. You may have this test every year starting at age 82.  Flexible sigmoidoscopy or colonoscopy. You may have a sigmoidoscopy every 5 years or a colonoscopy every 10 years starting at age 82.  Prostate cancer screening. Recommendations will vary depending on your family history and other risks.  Hepatitis C blood test.  Hepatitis B blood test.  Sexually transmitted disease (STD) testing.  Diabetes screening. This is done by checking your blood sugar (glucose) after you have not eaten for a while (fasting). You may have this done every 1-3 years.  Abdominal aortic aneurysm (AAA) screening. You may need this if you are a current or former smoker.  Osteoporosis. You may be screened starting at age 46  if you are at high risk. Talk with your health care provider about your test results, treatment options, and if necessary, the need for more tests. Vaccines  Your health care provider may recommend certain vaccines, such as:  Influenza vaccine. This  is recommended every year.  Tetanus, diphtheria, and acellular pertussis (Tdap, Td) vaccine. You may need a Td booster every 10 years.  Zoster vaccine. You may need this after age 40.  Pneumococcal 13-valent conjugate (PCV13) vaccine. One dose is recommended after age 82.  Pneumococcal polysaccharide (PPSV23) vaccine. One dose is recommended after age 82. Talk to your health care provider about which screenings and vaccines you need and how often you need them. This information is not intended to replace advice given to you by your health care provider. Make sure you discuss any questions you have with your health care provider. Document Released: 03/08/2015 Document Revised: 10/30/2015 Document Reviewed: 12/11/2014 Elsevier Interactive Patient Education  2017 [place] Prevention in the Home Falls can cause injuries. They can happen to people of all ages. There are many things you can do to make your home safe and to help prevent falls. What can I do on the outside of my home?  Regularly fix the edges of walkways and driveways and fix any cracks.  Remove anything that might make you trip as you walk through a door, such as a raised step or threshold.  Trim any bushes or trees on the path to your home.  Use bright outdoor lighting.  Clear any walking paths of anything that might make someone trip, such as rocks or tools.  Regularly check to see if handrails are loose or broken. Make sure that both sides of any steps have handrails.  Any raised decks and porches should have guardrails on the edges.  Have any leaves, snow, or ice cleared regularly.  Use sand or salt on walking paths during winter.  Clean up any spills in your garage right away. This includes oil or grease spills. What can I do in the bathroom?  Use night lights.  Install grab bars by the toilet and in the tub and shower. Do not use towel bars as grab bars.  Use non-skid mats or decals in the tub or  shower.  If you need to sit down in the shower, use a plastic, non-slip stool.  Keep the floor dry. Clean up any water that spills on the floor as soon as it happens.  Remove soap buildup in the tub or shower regularly.  Attach bath mats securely with double-sided non-slip rug tape.  Do not have throw rugs and other things on the floor that can make you trip. What can I do in the bedroom?  Use night lights.  Make sure that you have a light by your bed that is easy to reach.  Do not use any sheets or blankets that are too big for your bed. They should not hang down onto the floor.  Have a firm chair that has side arms. You can use this for support while you get dressed.  Do not have throw rugs and other things on the floor that can make you trip. What can I do in the kitchen?  Clean up any spills right away.  Avoid walking on wet floors.  Keep items that you use a lot in easy-to-reach places.  If you need to reach something above you, use a strong step stool that has a grab bar.  Keep electrical  cords out of the way.  Do not use floor polish or wax that makes floors slippery. If you must use wax, use non-skid floor wax.  Do not have throw rugs and other things on the floor that can make you trip. What can I do with my stairs?  Do not leave any items on the stairs.  Make sure that there are handrails on both sides of the stairs and use them. Fix handrails that are broken or loose. Make sure that handrails are as long as the stairways.  Check any carpeting to make sure that it is firmly attached to the stairs. Fix any carpet that is loose or worn.  Avoid having throw rugs at the top or bottom of the stairs. If you do have throw rugs, attach them to the floor with carpet tape.  Make sure that you have a light switch at the top of the stairs and the bottom of the stairs. If you do not have them, ask someone to add them for you. What else can I do to help prevent  falls?  Wear shoes that:  Do not have high heels.  Have rubber bottoms.  Are comfortable and fit you well.  Are closed at the toe. Do not wear sandals.  If you use a stepladder:  Make sure that it is fully opened. Do not climb a closed stepladder.  Make sure that both sides of the stepladder are locked into place.  Ask someone to hold it for you, if possible.  Clearly mark and make sure that you can see:  Any grab bars or handrails.  First and last steps.  Where the edge of each step is.  Use tools that help you move around (mobility aids) if they are needed. These include:  Canes.  Walkers.  Scooters.  Crutches.  Turn on the lights when you go into a dark area. Replace any light bulbs as soon as they burn out.  Set up your furniture so you have a clear path. Avoid moving your furniture around.  If any of your floors are uneven, fix them.  If there are any pets around you, be aware of where they are.  Review your medicines with your doctor. Some medicines can make you feel dizzy. This can increase your chance of falling. Ask your doctor what other things that you can do to help prevent falls. This information is not intended to replace advice given to you by your health care provider. Make sure you discuss any questions you have with your health care provider. Document Released: 12/06/2008 Document Revised: 07/18/2015 Document Reviewed: 03/16/2014 Elsevier Interactive Patient Education  2017 [employer].

## 2017-07-01 ENCOUNTER — Other Ambulatory Visit: Payer: Self-pay | Admitting: Family Medicine

## 2017-07-01 NOTE — Telephone Encounter (Signed)
Donepezil (Aricept) refill Last OV: 06/28/17 Last Refill:11/02/16 #30 tab 6 RF Pharmacy:[facility] [address] PCP: Dr Jeananne Rama

## 2017-07-28 ENCOUNTER — Ambulatory Visit (INDEPENDENT_AMBULATORY_CARE_PROVIDER_SITE_OTHER): Payer: Medicare HMO | Admitting: Family Medicine

## 2017-07-28 ENCOUNTER — Encounter: Payer: Self-pay | Admitting: Family Medicine

## 2017-07-28 VITALS — BP 176/97 | HR 74 | Wt 173.0 lb

## 2017-07-28 DIAGNOSIS — F0390 Unspecified dementia without behavioral disturbance: Secondary | ICD-10-CM | POA: Diagnosis not present

## 2017-07-28 DIAGNOSIS — R490 Dysphonia: Secondary | ICD-10-CM | POA: Insufficient documentation

## 2017-07-28 DIAGNOSIS — R69 Illness, unspecified: Secondary | ICD-10-CM | POA: Diagnosis not present

## 2017-07-28 NOTE — Assessment & Plan Note (Signed)
Worsening hoarseness-like symptoms with enlarging larynx will refer to ear nose and throat to further evaluate.

## 2017-07-28 NOTE — Assessment & Plan Note (Addendum)
Discussed issues of memory loss and possible strokelike symptoms patient has been seeing Dr. Melrose Nakayama at [facility] clinic will refer back to Dr. Melrose Nakayama to further evaluate. Check labs

## 2017-07-28 NOTE — Progress Notes (Signed)
BP (!) 176/97   Pulse 74   Wt 173 lb (78.5 kg)   SpO2 98%   BMI 25.55 kg/m    Subjective:    Patient ID: Jason Azucena Fallen., male    DOB: 01/16/1929, 84 y.o.   MRN: [id number]  HPI: Jason W Tarron Krolak. is a 84 y.o. male  Chief Complaint  Patient presents with  . Cognitive    Wife concerns about slurring, mumbling, memory issues. Lazy vs TIA/Stroke  . Cyst    on throat? Noticed by hairdresser  . Lipoma    Left side, removed x 3 years ago, causing pain now.    Patient discussion and primarily with wife regarding history patient with memory issues and some speech slurring drooling type behaviors and more mumbling and becoming more difficult to understand. Mini-Mental status exam done here was normal. The symptoms have been becoming more prominent over the last month.  Patient also with complaints of hoarseness getting worse over the last month or so more clearing his throat and is noticed an enlargement of his larynx area.  Relevant past medical, surgical, family and social history reviewed and updated as indicated. Interim medical history since our last visit reviewed. Allergies and medications reviewed and updated.  Review of Systems  Constitutional: Negative.   Respiratory: Negative.   Cardiovascular: Negative.     Per HPI unless specifically indicated above     Objective:    BP (!) 176/97   Pulse 74   Wt 173 lb (78.5 kg)   SpO2 98%   BMI 25.55 kg/m   Wt Readings from Last 3 Encounters:  07/28/17 173 lb (78.5 kg)  06/28/17 172 lb 1.6 oz (78.1 kg)  02/04/17 174 lb (78.9 kg)    Physical Exam  Constitutional: He is oriented to person, place, and time. He appears well-developed and well-nourished.  HENT:  Head: Normocephalic and atraumatic.  Right Ear: External ear normal.  Left Ear: External ear normal.  Nose: Nose normal.  Mouth/Throat: Oropharynx is clear and moist.  Eyes: Conjunctivae and EOM are normal.  Neck: Normal range of motion.    Cardiovascular: Normal rate, regular rhythm and normal heart sounds.  Pulmonary/Chest: Effort normal and breath sounds normal.  Musculoskeletal: Normal range of motion.  Neurological: He is alert and oriented to person, place, and time.  Skin: No erythema.  Psychiatric: He has a normal mood and affect. His behavior is normal. Judgment and thought content normal.    Results for orders placed or performed in visit on 01/26/17  Microscopic Examination  Result Value Ref Range   WBC, UA 0-5 0 - 5 /hpf   RBC, UA 0-2 0 - 2 /hpf   Epithelial Cells (non renal) 0-10 0 - 10 /hpf   Casts Present None seen /lpf   Cast Type Granular casts (A) N/A   Crystals Present N/A   Crystal Type Calcium Oxalate N/A   Mucus, UA Present Not Estab.   Bacteria, UA None seen None seen/Few  Basic metabolic panel  Result Value Ref Range   Glucose 102 (H) 65 - 99 mg/dL   BUN 24 8 - 27 mg/dL   Creatinine, Ser 1.51 (H) 0.76 - 1.27 mg/dL   GFR calc non Af Amer 41 (L) >59 mL/min/1.73   GFR calc Af Amer 48 (L) >59 mL/min/1.73   BUN/Creatinine Ratio 16 10 - 24   Sodium 143 134 - 144 mmol/L   Potassium 4.2 3.5 - 5.2 mmol/L   Chloride 103 96 -  106 mmol/L   CO2 22 20 - 29 mmol/L   Calcium 9.4 8.6 - 10.2 mg/dL  Urinalysis, Routine w reflex microscopic  Result Value Ref Range   Specific Gravity, UA 1.025 1.005 - 1.030   pH, UA 5.5 5.0 - 7.5   Color, UA Yellow Yellow   Appearance Ur Hazy (A) Clear   Leukocytes, UA Trace (A) Negative   Protein, UA 1+ (A) Negative/Trace   Glucose, UA Negative Negative   Ketones, UA Negative Negative   RBC, UA Trace (A) Negative   Bilirubin, UA Negative Negative   Urobilinogen, Ur 1.0 0.2 - 1.0 mg/dL   Nitrite, UA Negative Negative   Microscopic Examination See below:   CBC with Differential/Platelet  Result Value Ref Range   WBC 11.7 (H) 3.4 - 10.8 x10E3/uL   RBC 4.57 4.14 - 5.80 x10E6/uL   Hemoglobin 13.2 13.0 - 17.7 g/dL   Hematocrit 40.8 37.5 - 51.0 %   MCV 89 79 - 97 fL    MCH 28.9 26.6 - 33.0 pg   MCHC 32.4 31.5 - 35.7 g/dL   RDW 14.1 12.3 - 15.4 %   Platelets 338 150 - 379 x10E3/uL   Neutrophils 76 Not Estab. %   Lymphs 16 Not Estab. %   Monocytes 7 Not Estab. %   Eos 1 Not Estab. %   Basos 0 Not Estab. %   Neutrophils Absolute 8.9 (H) 1.4 - 7.0 x10E3/uL   Lymphocytes Absolute 1.8 0.7 - 3.1 x10E3/uL   Monocytes Absolute 0.8 0.1 - 0.9 x10E3/uL   EOS (ABSOLUTE) 0.1 0.0 - 0.4 x10E3/uL   Basophils Absolute 0.0 0.0 - 0.2 x10E3/uL   Immature Granulocytes 0 Not Estab. %   Immature Grans (Abs) 0.0 0.0 - 0.1 x10E3/uL      Assessment & Plan:   Problem List Items Addressed This Visit      Nervous and Auditory   Dementia - Primary    Discussed issues of memory loss and possible strokelike symptoms patient has been seeing Dr. Melrose Nakayama at [facility] will refer back to Dr. Melrose Nakayama to further evaluate. Check labs      Relevant Orders   Comprehensive metabolic panel   CBC with Differential/Platelet   TSH   Vitamin B12   Ambulatory referral to Neurology     Other   Hoarseness    Worsening hoarseness-like symptoms with enlarging larynx will refer to ear nose and throat to further evaluate.      Relevant Orders   Ambulatory referral to ENT       Follow up plan: Return in about 2 months (around 09/27/2017) for Physical Exam.

## 2017-07-29 ENCOUNTER — Telehealth: Payer: Self-pay | Admitting: Family Medicine

## 2017-07-29 DIAGNOSIS — E876 Hypokalemia: Secondary | ICD-10-CM

## 2017-07-29 LAB — COMPREHENSIVE METABOLIC PANEL
ALK PHOS: 98 IU/L (ref 39–117)
ALT: 16 IU/L (ref 0–44)
AST: 19 IU/L (ref 0–40)
Albumin/Globulin Ratio: 1.6 (ref 1.2–2.2)
Albumin: 4.2 g/dL (ref 3.5–4.7)
BUN/Creatinine Ratio: 13 (ref 10–24)
BUN: 19 mg/dL (ref 8–27)
Bilirubin Total: 1 mg/dL (ref 0.0–1.2)
CO2: 24 mmol/L (ref 20–29)
CREATININE: 1.45 mg/dL — AB (ref 0.76–1.27)
Calcium: 9.5 mg/dL (ref 8.6–10.2)
Chloride: 105 mmol/L (ref 96–106)
GFR calc Af Amer: 50 mL/min/{1.73_m2} — ABNORMAL LOW (ref >59)
GFR calc non Af Amer: 43 mL/min/{1.73_m2} — ABNORMAL LOW (ref >59)
GLUCOSE: 82 mg/dL (ref 65–99)
Globulin, Total: 2.6 g/dL (ref 1.5–4.5)
Potassium: 5.9 mmol/L — ABNORMAL HIGH (ref 3.5–5.2)
Sodium: 141 mmol/L (ref 134–144)
Total Protein: 6.8 g/dL (ref 6.0–8.5)

## 2017-07-29 LAB — CBC WITH DIFFERENTIAL/PLATELET
Basophils Absolute: 0 10*3/uL (ref 0.0–0.2)
Basos: 1 %
EOS (ABSOLUTE): 0.1 10*3/uL (ref 0.0–0.4)
EOS: 2 %
HEMATOCRIT: 43.2 % (ref 37.5–51.0)
Hemoglobin: 14 g/dL (ref 13.0–17.7)
IMMATURE GRANULOCYTES: 0 %
Immature Grans (Abs): 0 10*3/uL (ref 0.0–0.1)
LYMPHS: 28 %
Lymphocytes Absolute: 1.8 10*3/uL (ref 0.7–3.1)
MCH: 28.2 pg (ref 26.6–33.0)
MCHC: 32.4 g/dL (ref 31.5–35.7)
MCV: 87 fL (ref 79–97)
MONOS ABS: 0.4 10*3/uL (ref 0.1–0.9)
Monocytes: 7 %
NEUTROS PCT: 62 %
Neutrophils Absolute: 4.1 10*3/uL (ref 1.4–7.0)
Platelets: 228 10*3/uL (ref 150–450)
RBC: 4.96 x10E6/uL (ref 4.14–5.80)
RDW: 15.3 % (ref 12.3–15.4)
WBC: 6.5 10*3/uL (ref 3.4–10.8)

## 2017-07-29 LAB — VITAMIN B12: VITAMIN B 12: 394 pg/mL (ref 232–1245)

## 2017-07-29 LAB — TSH: TSH: 0.808 u[IU]/mL (ref 0.450–4.500)

## 2017-07-29 NOTE — Telephone Encounter (Signed)
Phone call with patient's wife no difficult stick for high potassium will recheck BMP in a couple of weeks.

## 2017-08-04 DIAGNOSIS — R413 Other amnesia: Secondary | ICD-10-CM | POA: Insufficient documentation

## 2017-08-04 DIAGNOSIS — R410 Disorientation, unspecified: Secondary | ICD-10-CM | POA: Diagnosis not present

## 2017-08-04 DIAGNOSIS — R251 Tremor, unspecified: Secondary | ICD-10-CM | POA: Diagnosis not present

## 2017-08-04 DIAGNOSIS — K117 Disturbances of salivary secretion: Secondary | ICD-10-CM | POA: Insufficient documentation

## 2017-08-04 DIAGNOSIS — G20 Parkinson's disease: Secondary | ICD-10-CM | POA: Insufficient documentation

## 2017-08-04 DIAGNOSIS — R4701 Aphasia: Secondary | ICD-10-CM | POA: Diagnosis not present

## 2017-08-04 DIAGNOSIS — R262 Difficulty in walking, not elsewhere classified: Secondary | ICD-10-CM | POA: Insufficient documentation

## 2017-08-04 DIAGNOSIS — G20A1 Parkinson's disease without dyskinesia, without mention of fluctuations: Secondary | ICD-10-CM | POA: Insufficient documentation

## 2017-08-04 DIAGNOSIS — R42 Dizziness and giddiness: Secondary | ICD-10-CM | POA: Diagnosis not present

## 2017-08-06 ENCOUNTER — Other Ambulatory Visit: Payer: Self-pay | Admitting: Neurology

## 2017-08-06 ENCOUNTER — Other Ambulatory Visit (HOSPITAL_COMMUNITY): Payer: Self-pay | Admitting: Neurology

## 2017-08-06 DIAGNOSIS — K117 Disturbances of salivary secretion: Secondary | ICD-10-CM

## 2017-08-06 DIAGNOSIS — R4701 Aphasia: Secondary | ICD-10-CM

## 2017-08-06 DIAGNOSIS — R410 Disorientation, unspecified: Secondary | ICD-10-CM

## 2017-08-11 ENCOUNTER — Other Ambulatory Visit: Payer: Medicare HMO

## 2017-08-11 DIAGNOSIS — E876 Hypokalemia: Secondary | ICD-10-CM

## 2017-08-12 ENCOUNTER — Ambulatory Visit (HOSPITAL_COMMUNITY)
Admission: RE | Admit: 2017-08-12 | Discharge: 2017-08-12 | Disposition: A | Discharge status: Home / Self Care | Payer: Medicare HMO | Source: Ambulatory Visit | Attending: Neurology | Admitting: Neurology

## 2017-08-12 DIAGNOSIS — R410 Disorientation, unspecified: Secondary | ICD-10-CM | POA: Diagnosis not present

## 2017-08-12 DIAGNOSIS — K117 Disturbances of salivary secretion: Secondary | ICD-10-CM

## 2017-08-12 DIAGNOSIS — R4701 Aphasia: Secondary | ICD-10-CM | POA: Insufficient documentation

## 2017-08-12 LAB — BASIC METABOLIC PANEL
BUN/Creatinine Ratio: 14 (ref 10–24)
BUN: 20 mg/dL (ref 8–27)
CALCIUM: 9.3 mg/dL (ref 8.6–10.2)
CHLORIDE: 104 mmol/L (ref 96–106)
CO2: 21 mmol/L (ref 20–29)
Creatinine, Ser: 1.45 mg/dL — ABNORMAL HIGH (ref 0.76–1.27)
GFR calc Af Amer: 50 mL/min/{1.73_m2} — ABNORMAL LOW (ref >59)
GFR calc non Af Amer: 43 mL/min/{1.73_m2} — ABNORMAL LOW (ref >59)
GLUCOSE: 94 mg/dL (ref 65–99)
POTASSIUM: 4.2 mmol/L (ref 3.5–5.2)
Sodium: 142 mmol/L (ref 134–144)

## 2017-08-13 DIAGNOSIS — E041 Nontoxic single thyroid nodule: Secondary | ICD-10-CM | POA: Diagnosis not present

## 2017-08-13 DIAGNOSIS — R490 Dysphonia: Secondary | ICD-10-CM | POA: Diagnosis not present

## 2017-09-07 ENCOUNTER — Encounter: Payer: Self-pay | Admitting: Family Medicine

## 2017-09-07 ENCOUNTER — Ambulatory Visit (INDEPENDENT_AMBULATORY_CARE_PROVIDER_SITE_OTHER): Payer: Medicare HMO | Admitting: Family Medicine

## 2017-09-07 VITALS — BP 132/76 | HR 73 | Ht 69.25 in | Wt 171.0 lb

## 2017-09-07 DIAGNOSIS — I10 Essential (primary) hypertension: Secondary | ICD-10-CM | POA: Diagnosis not present

## 2017-09-07 DIAGNOSIS — Z7189 Other specified counseling: Secondary | ICD-10-CM | POA: Diagnosis not present

## 2017-09-07 DIAGNOSIS — N189 Chronic kidney disease, unspecified: Secondary | ICD-10-CM | POA: Diagnosis not present

## 2017-09-07 DIAGNOSIS — N179 Acute kidney failure, unspecified: Secondary | ICD-10-CM

## 2017-09-07 DIAGNOSIS — I2510 Atherosclerotic heart disease of native coronary artery without angina pectoris: Secondary | ICD-10-CM

## 2017-09-07 DIAGNOSIS — F0390 Unspecified dementia without behavioral disturbance: Secondary | ICD-10-CM

## 2017-09-07 DIAGNOSIS — Z0001 Encounter for general adult medical examination with abnormal findings: Secondary | ICD-10-CM | POA: Diagnosis not present

## 2017-09-07 DIAGNOSIS — M4306 Spondylolysis, lumbar region: Secondary | ICD-10-CM | POA: Diagnosis not present

## 2017-09-07 DIAGNOSIS — G6289 Other specified polyneuropathies: Secondary | ICD-10-CM

## 2017-09-07 DIAGNOSIS — K219 Gastro-esophageal reflux disease without esophagitis: Secondary | ICD-10-CM | POA: Diagnosis not present

## 2017-09-07 DIAGNOSIS — R69 Illness, unspecified: Secondary | ICD-10-CM | POA: Diagnosis not present

## 2017-09-07 MED ORDER — DONEPEZIL HCL 5 MG PO TABS: 5.0000 mg | ORAL_TABLET | Freq: Every day | ORAL | 12 refills | Status: DC

## 2017-09-07 MED ORDER — GABAPENTIN 300 MG PO CAPS: 300.0000 mg | ORAL_CAPSULE | Freq: Two times a day (BID) | ORAL | 12 refills | Status: DC

## 2017-09-07 NOTE — Assessment & Plan Note (Signed)
A voluntary discussion about advanced care planning including explanation and discussion of advanced directives was extentively discussed with the patient.  Explained about the healthcare proxy and living will was reviewed and packet with forms with expiration of how to fill them out was given.  Time spent: Encounter 16+ min individuals present: Patient 

## 2017-09-07 NOTE — Assessment & Plan Note (Signed)
See note above stable on 300 twice a day not interested in other medications

## 2017-09-07 NOTE — Assessment & Plan Note (Signed)
Followed by neurology and stable

## 2017-09-07 NOTE — Progress Notes (Signed)
BP 132/76   Pulse 73   Ht 5' 9.25" (1.759 m)   Wt 171 lb (77.6 kg)   SpO2 98%   BMI 25.07 kg/m    Subjective:    Patient ID: Jason Cross., male    DOB: [birth year], 84 y.o.   MRN: [id number]  HPI: Jason Cross. is a 84 y.o. male  Chief Complaint  Patient presents with  . Annual Exam  Patient accompanied by his wife who assists with history.  All in all doing well no real specific complaints.  Did review MRI report which is essentially normal except for changes of dementia. No complaints of chest pain cardiac issues. Blood pressure doing well. Claims from breathing or reflux. Dementia followed by neurology. Tried high-dose gabapentin 600 twice daily for neuropathy of legs with no relief or change in symptoms discussed going back to 300 twice a day as having some drowsiness side effect with higher dose gabapentin  Relevant past medical, surgical, family and social history reviewed and updated as indicated. Interim medical history since our last visit reviewed. Allergies and medications reviewed and updated.  Review of Systems  Per HPI unless specifically indicated above     Objective:    BP 132/76   Pulse 73   Ht 5' 9.25" (1.759 m)   Wt 171 lb (77.6 kg)   SpO2 98%   BMI 25.07 kg/m   Wt Readings from Last 3 Encounters:  09/07/17 171 lb (77.6 kg)  07/28/17 173 lb (78.5 kg)  06/28/17 172 lb 1.6 oz (78.1 kg)    Physical Exam  Results for orders placed or performed in visit on 08/23/14  Basic metabolic panel  Result Value Ref Range   Glucose 94 65 - 99 mg/dL   BUN 20 8 - 27 mg/dL   Creatinine, Ser 1.45 (H) 0.76 - 1.27 mg/dL   GFR calc non Af Amer 43 (L) >59 mL/min/1.73   GFR calc Af Amer 50 (L) >59 mL/min/1.73   BUN/Creatinine Ratio 14 10 - 24   Sodium 142 134 - 144 mmol/L   Potassium 4.2 3.5 - 5.2 mmol/L   Chloride 104 96 - 106 mmol/L   CO2 21 20 - 29 mmol/L   Calcium 9.3 8.6 - 10.2 mg/dL      Assessment & Plan:   Problem List Items  Addressed This Visit      Cardiovascular and Mediastinum   Hypertension - Primary    The current medical regimen is effective;  continue present plan and medications.       Coronary artery disease     Digestive   GERD (gastroesophageal reflux disease)    The current medical regimen is effective;  continue present plan and medications.         Nervous and Auditory   Peripheral neuropathy    See note above stable on 300 twice a day not interested in other medications      Relevant Medications   gabapentin (NEURONTIN) 300 MG capsule   donepezil (ARICEPT) 5 MG tablet   Dementia    Followed by neurology and stable      Relevant Medications   gabapentin (NEURONTIN) 300 MG capsule   donepezil (ARICEPT) 5 MG tablet     Musculoskeletal and Integument   Lumbar spondylolysis     Genitourinary   Renal failure, acute on chronic (HCC)    The current medical regimen is effective;  continue present plan and medications.  Other   Advanced care planning/counseling discussion    A voluntary discussion about advanced care planning including explanation and discussion of advanced directives was extentively discussed with the patient.  Explained about the healthcare proxy and living will was reviewed and packet with forms with expiration of how to fill them out was given.  Time spent: Encounter 16+ min individuals present: Patient          Follow up plan: Return in about 6 months (around 03/10/2018) for BMP.

## 2017-09-07 NOTE — Assessment & Plan Note (Signed)
The current medical regimen is effective;  continue present plan and medications.  

## 2017-09-09 DIAGNOSIS — L570 Actinic keratosis: Secondary | ICD-10-CM | POA: Diagnosis not present

## 2017-09-09 DIAGNOSIS — L821 Other seborrheic keratosis: Secondary | ICD-10-CM | POA: Diagnosis not present

## 2017-09-09 DIAGNOSIS — L578 Other skin changes due to chronic exposure to nonionizing radiation: Secondary | ICD-10-CM | POA: Diagnosis not present

## 2017-09-09 DIAGNOSIS — Z85828 Personal history of other malignant neoplasm of skin: Secondary | ICD-10-CM | POA: Diagnosis not present

## 2017-10-05 DIAGNOSIS — G8929 Other chronic pain: Secondary | ICD-10-CM | POA: Diagnosis not present

## 2017-10-05 DIAGNOSIS — I10 Essential (primary) hypertension: Secondary | ICD-10-CM | POA: Diagnosis not present

## 2017-10-05 DIAGNOSIS — R42 Dizziness and giddiness: Secondary | ICD-10-CM | POA: Diagnosis not present

## 2017-10-05 DIAGNOSIS — R251 Tremor, unspecified: Secondary | ICD-10-CM | POA: Diagnosis not present

## 2017-10-05 DIAGNOSIS — R262 Difficulty in walking, not elsewhere classified: Secondary | ICD-10-CM | POA: Diagnosis not present

## 2017-10-05 DIAGNOSIS — R2689 Other abnormalities of gait and mobility: Secondary | ICD-10-CM | POA: Diagnosis not present

## 2017-10-05 DIAGNOSIS — R413 Other amnesia: Secondary | ICD-10-CM | POA: Diagnosis not present

## 2017-10-05 DIAGNOSIS — M545 Low back pain: Secondary | ICD-10-CM | POA: Diagnosis not present

## 2017-10-05 DIAGNOSIS — I69320 Aphasia following cerebral infarction: Secondary | ICD-10-CM | POA: Diagnosis not present

## 2017-10-12 ENCOUNTER — Other Ambulatory Visit: Payer: Self-pay

## 2017-10-12 ENCOUNTER — Ambulatory Visit: Payer: Medicare HMO | Attending: Family Medicine | Admitting: Physical Therapy

## 2017-10-12 ENCOUNTER — Encounter: Payer: Self-pay | Admitting: Physical Therapy

## 2017-10-12 DIAGNOSIS — R262 Difficulty in walking, not elsewhere classified: Secondary | ICD-10-CM | POA: Insufficient documentation

## 2017-10-12 DIAGNOSIS — M6281 Muscle weakness (generalized): Secondary | ICD-10-CM | POA: Diagnosis not present

## 2017-10-12 DIAGNOSIS — R2681 Unsteadiness on feet: Secondary | ICD-10-CM | POA: Diagnosis not present

## 2017-10-12 NOTE — Patient Instructions (Signed)
Access Code: [email or web address]  URL: [email or web address]  Date: 10/12/2017  Prepared by: Collie Siad   Exercises  Standing Tandem Balance with Counter Support - 5 reps - 1 sets - 30 hold - 2x daily - 7x weekly  Side Step Overs with Cones and Counter Support - 10 reps - 2 sets - 2x daily - 7x weekly  Heel Raises with Unilateral Counter Support - 15 reps - 2 sets - 2x daily - 7x weekly  Standing March with Counter Support - 15 reps - 2 sets - 2x daily - 7x weekly

## 2017-10-12 NOTE — Therapy (Addendum)
[place] MAIN [facility] SERVICES [address] [place], [place], [address] Phone: [phone]   Fax:  [phone]  Physical Therapy Evaluation  Patient Details  Name: Jason Cross. MRN: [id number] Date of Birth: 1928-07-02 Referring Provider: Dr. Melrose Nakayama   Encounter Date: 10/12/2017  PT End of Session - 10/12/17 1648    Visit Number  1    Number of Visits  5    Date for PT Re-Evaluation  11/09/17    Authorization Type  1/10 progress note (start of reporting period 8/20)    PT Start Time  1402    PT Stop Time  1506    PT Time Calculation (min)  64 min    Equipment Utilized During Treatment  Gait belt    Activity Tolerance  Patient tolerated treatment well    Behavior During Therapy  WFL for tasks assessed/performed       Past Medical History:  Diagnosis Date  . Arthritis   . Benign prostate hyperplasia   . Cataracts, bilateral   . Coarse tremors    L>R hands  . COPD (chronic obstructive pulmonary disease) ([place])    first stages  . Coronary artery disease   . Dizziness    occasional  . Gastric ulcer    in the 60's  . GERD (gastroesophageal reflux disease)    takes Omeprazole every other day  . Hard of hearing    doesn't wear hearing aids  . History of colon polyps   . History of kidney stones    still one on the left side, low  . Hyperlipidemia    takes Simvastatin daily  . Hypertension    stopped BP meds, BP under control  . Myocardial infarction ([place]) 2009  . Peripheral neuropathy    takes Gabapentin daily  . Pneumonia    hx of in the 60's  . Sleep apnea     Past Surgical History:  Procedure Laterality Date  . BACK SURGERY  2007  . COLONOSCOPY    . CORONARY ARTERY BYPASS GRAFT  2009   LIMA to LAD, SVG to D1 07/2007  . EYE SURGERY Bilateral 2004, 2005   implants  . GREEN LIGHT LASER TURP (TRANSURETHRAL RESECTION OF PROSTATE N/A 05/10/2013   Procedure: GREEN LIGHT LASER TURP (TRANSURETHRAL RESECTION OF  PROSTATE;  Surgeon: Ardis Hughs, MD;  Location: [facility]S;  Service: Urology;  Laterality: N/A;  . lipoma removed     chest wall  . microthermo prostate  2009  . [place]  . PROSTATE SURGERY  10/14  . THORACIC DISCECTOMY N/A 03/10/2013   Procedure: Thoracic twelve-Lumbar one Laminectomy;  Surgeon: Erline Levine, MD;  Location: [facility];  Service: Neurosurgery;  Laterality: N/A;  Thoracic twelve-Lumbar one Laminectomy  . TONSILLECTOMY     at age 80    There were no vitals filed for this visit.   Subjective Assessment - 10/12/17 1417    Subjective  Patient states he had back surgery several years ago which resulted in a pinched nerve that caused numbness all the way down the R leg and partially some in the L leg; states "I do not think PT can help a pinched nerve."     Patient is accompained by:  Family member   Wife   Pertinent History  Patient is a 84 year old male with significant PMH of hypertension, CAD, GERD, peripheral neuropathy, dementia, tremor, aphasia, and dizziness. Pt has a history of back  surgery in 2007 and CABG in 2009. Pt presents with current complaints of some unsteadiness while walking which pt attributes to back surgery and pinched nerve in 2007. Pt does state he has had some difficulties with hearing and feels as though his balance has decreased since his hearing has been impaired. Pt's wife states she has noted unsteadiness and imbalance as well since about 2007 but reports seeing improvement in his endurance and balance when he goes with her to the gym to walk. Pt states he does not really have a reason he has not been walking with his wife at the gym but states he would be willing to get back to it.     Limitations  Standing;Walking    How long can you stand comfortably?  about 10 minutes or so     How long can you walk comfortably?  about 10 minutes at a time    Diagnostic tests  MRI of brain and lumbar regions.  MRI brain 6/20 neg for acute  abnormality.      Patient Stated Goals  "no pain, everything normal"    Currently in Pain?  Yes    Pain Score  2     Pain Location  Leg    Pain Orientation  Right    Pain Descriptors / Indicators  Other (Comment)   Stinging   Pain Type  Chronic pain    Pain Onset  More than a month ago    Pain Frequency  Constant    Aggravating Factors   Getting out of bed and putting foot on the floor    Pain Relieving Factors  Walking makes the pain better    Effect of Pain on Daily Activities  does not prevent any activity     Multiple Pain Sites  Yes    Pain Score  3    Pain Location  Shoulder    Pain Orientation  Left    Pain Descriptors / Indicators  Aching;Dull    Pain Type  Chronic pain    Pain Onset  More than a month ago    Pain Frequency  Intermittent    Aggravating Factors   unsure    Pain Relieving Factors  rubbing it    Effect of Pain on Daily Activities  does not prevent him from any activity                     Objective measurements completed on examination: See above findings.   Treatment HEP:  Standing Tandem Balance with Mat table support x10 sec holds x1 rep each foot in rear Side Step Overs with Cones and mat table support x3 reps each direction  Heel Raises with mat table support x5 reps bilaterally       Standing March with mat table support x5 reps bilaterally       PT Education - 10/12/17 1647    Education Details  plan of care, HEP    Person(s) Educated  Patient;Spouse    Methods  Explanation;Demonstration    Comprehension  Verbalized understanding;Returned demonstration       PT Short Term Goals - 10/12/17 1700      PT SHORT TERM GOAL #1   Title  Patient will be independent in home exercise program to improve strength/mobility for better functional independence with ADLs.    Time  2    Period  Weeks    Status  New    Target Date  10/26/17  PT Long Term Goals - 10/12/17 1700      PT LONG TERM GOAL #1   Title  Patient will  increase gait speed to >1.69m/s as to improve gait speed for better community ambulation and to reduce fall risk.    Baseline  8/20: 0.81 m/s with SPC    Time  4    Period  Weeks    Status  New    Target Date  11/09/17      PT LONG TERM GOAL #2   Title  Pt will decrease 5 times sit-to-stand time to less than 15 sec without UE support to demonstrate decreased fall risk and increased LE strength and endurance.    Baseline  8/20: 14.4 sec with BUE support    Time  4    Period  Weeks    Status  New    Target Date  11/09/17      PT LONG TERM GOAL #3   Title  Pt will improve Dynamic Gait Index score by 5 points to decrease fall risk in home and community environments.     Baseline  8/20: 14/24    Time  4    Period  Weeks    Status  New    Target Date  11/09/17             Plan - 10/12/17 1648    Clinical Impression Statement  Jason Cross is a pleasant 84 yo male with chief complaint of unsteadiness and imbalance with difficulty walking. Pt presents with some weakness in hip musculature and ankle musculature, specifically hip flexors and ankle DFs. Pt demonstrated increased fall risk with 14.4 sec with BUE time for 5xSTS, 0.81 m/s gait speed, and 14/24 DGI score. Pt verbalized concern with copay amount for therapy session and desire to continue with therapy beyond today. Pt agreeable to HEP to work on balance and LE strengthening at home. Pt would benefit from skilled PT intervention for improvements in balance, strength, and gait safety.     History and Personal Factors relevant to plan of care:  (+) family support (-) dementia, comorbidities, not sure PT will be beneficial for him    Clinical Presentation  Evolving    Clinical Presentation due to:  dementia, comorbidities    Clinical Decision Making  Moderate    Rehab Potential  Fair    Clinical Impairments Affecting Rehab Potential  (+) family support (-) dementia, comorbidities, decreased motivation     PT Frequency  1x / week    PT  Duration  4 weeks    PT Treatment/Interventions  [place] Repostioning;Cryotherapy;Electrical Stimulation;Moist Heat;Ultrasound;Traction;DME Instruction;Gait training;Stair training;Functional mobility training;Therapeutic activities;Therapeutic exercise;Balance training;Neuromuscular re-education;Patient/family education;Manual techniques;Energy conservation;Vestibular    PT Next Visit Plan  continue HEP    PT Home Exercise Plan  tandem stance, side stepping over cones, marching on pillow, heel raises all at counter with BUE support    Consulted and Agree with Plan of Care  Patient;Family member/caregiver    Family Member Consulted  Wife       Patient will benefit from skilled therapeutic intervention in order to improve the following deficits and impairments:  Abnormal gait, Decreased activity tolerance, Decreased balance, Decreased coordination, Decreased endurance, Decreased mobility, Decreased safety awareness, Decreased strength, Difficulty walking, Dizziness, Postural dysfunction, Pain  Visit Diagnosis: Difficulty in walking, not elsewhere classified  Unsteadiness on feet  Muscle weakness (generalized)     Problem List Patient Active Problem List   Diagnosis Date Noted  . Dizziness 08/04/2017  .  Difficulty walking 08/04/2017  . Drooling 08/04/2017  . Expressive aphasia 08/04/2017  . Tremor 08/04/2017  . Loss of memory 08/04/2017  . Hoarseness 07/28/2017  . Advanced care planning/counseling discussion 07/21/2016  . GERD (gastroesophageal reflux disease) 12/30/2015  . Peripheral neuropathy 07/17/2015  . Dementia 07/17/2015  . Orthostatic hypotension 07/17/2015  . Hypertension 07/17/2015  . Coronary artery disease 07/17/2015  . Urinary retention with incomplete bladder emptying 03/20/2013  . Renal failure, acute on chronic (HCC) 03/20/2013  . Unspecified constipation 03/20/2013  . Lumbar spondylolysis 03/14/2013  . Lumbar scoliosis 03/10/2013   Harriet Masson,  SPT  This entire session was performed under direct supervision and direction of a licensed therapist/therapist assistant . I have personally read, edited and approve of the note as written. Collie Siad PT, DPT 10/13/2017, 9:58 AM  [place] MAIN [facility] SERVICES [address] [place], [place], [address] Phone: [phone]   Fax:  [phone]  Name: Jason Cross. MRN: [id number] Date of Birth: [birth year]

## 2017-10-18 ENCOUNTER — Ambulatory Visit: Payer: Medicare HMO | Admitting: Physical Therapy

## 2017-10-18 ENCOUNTER — Encounter: Payer: Self-pay | Admitting: Physical Therapy

## 2017-10-18 DIAGNOSIS — R262 Difficulty in walking, not elsewhere classified: Secondary | ICD-10-CM

## 2017-10-18 DIAGNOSIS — M6281 Muscle weakness (generalized): Secondary | ICD-10-CM

## 2017-10-18 DIAGNOSIS — R2681 Unsteadiness on feet: Secondary | ICD-10-CM

## 2017-10-18 NOTE — Therapy (Signed)
[place] MAIN [facility] SERVICES [address] [place], [place], [address] Phone: [phone]   Fax:  [phone]  October 18, 2017   _0 @  Physical Therapy Discharge Summary  Patient: Jason Cross.  MRN: [id number]  Date of Birth: [birth year]   Diagnosis: Unsteadiness on feet  Difficulty in walking, not elsewhere classified  Muscle weakness (generalized) Referring Provider: Dr. Melrose Nakayama   The above patient had been seen in Physical Therapy 1 times of 4 treatments scheduled with 0 no shows and 3 cancellations.  The treatment consisted of patient only attended evaluation; he called and cancelled remaining appointments. No reason given;  The patient is: Unchanged   No Goals Met    Sincerely,   Trotter,Margaret, PT DPT   CC _1 @  [place] [address] [place], [place], [address] Phone: [phone]   Fax:  [phone]  Patient: Jason Cross.  MRN: [id number]  Date of Birth: [birth year]

## 2017-10-20 ENCOUNTER — Ambulatory Visit: Payer: Medicare HMO | Admitting: Physical Therapy

## 2017-10-21 ENCOUNTER — Ambulatory Visit: Payer: Medicare HMO | Admitting: Physical Therapy

## 2017-10-26 ENCOUNTER — Ambulatory Visit: Payer: Medicare HMO | Admitting: Physical Therapy

## 2017-10-28 ENCOUNTER — Ambulatory Visit: Payer: Medicare HMO | Admitting: Physical Therapy

## 2017-11-01 DIAGNOSIS — I428 Other cardiomyopathies: Secondary | ICD-10-CM | POA: Diagnosis not present

## 2017-11-01 DIAGNOSIS — G4733 Obstructive sleep apnea (adult) (pediatric): Secondary | ICD-10-CM | POA: Diagnosis not present

## 2017-11-01 DIAGNOSIS — E782 Mixed hyperlipidemia: Secondary | ICD-10-CM | POA: Diagnosis not present

## 2017-11-01 DIAGNOSIS — I25810 Atherosclerosis of coronary artery bypass graft(s) without angina pectoris: Secondary | ICD-10-CM | POA: Diagnosis not present

## 2017-11-01 DIAGNOSIS — I10 Essential (primary) hypertension: Secondary | ICD-10-CM | POA: Diagnosis not present

## 2017-11-02 ENCOUNTER — Ambulatory Visit: Payer: Medicare HMO | Admitting: Physical Therapy

## 2017-11-08 ENCOUNTER — Ambulatory Visit: Payer: Medicare HMO | Admitting: Physical Therapy

## 2017-11-10 ENCOUNTER — Ambulatory Visit: Payer: Medicare HMO | Admitting: Physical Therapy

## 2017-11-16 ENCOUNTER — Ambulatory Visit: Payer: Medicare HMO | Admitting: Physical Therapy

## 2017-11-18 ENCOUNTER — Ambulatory Visit: Payer: Medicare HMO | Admitting: Physical Therapy

## 2017-11-23 ENCOUNTER — Ambulatory Visit: Payer: Medicare HMO | Admitting: Physical Therapy

## 2017-11-25 ENCOUNTER — Ambulatory Visit: Payer: Medicare HMO | Admitting: Physical Therapy

## 2017-11-29 ENCOUNTER — Ambulatory Visit: Payer: Medicare HMO | Admitting: Physical Therapy

## 2017-12-01 ENCOUNTER — Ambulatory Visit: Payer: Medicare HMO | Admitting: Physical Therapy

## 2017-12-06 ENCOUNTER — Ambulatory Visit: Payer: Medicare HMO | Admitting: Physical Therapy

## 2017-12-08 ENCOUNTER — Ambulatory Visit: Payer: Medicare HMO | Admitting: Physical Therapy

## 2017-12-21 ENCOUNTER — Encounter: Payer: Self-pay | Admitting: Family Medicine

## 2018-01-06 DIAGNOSIS — H43813 Vitreous degeneration, bilateral: Secondary | ICD-10-CM | POA: Diagnosis not present

## 2018-02-14 IMAGING — CR DG CHEST 2V
1 series · 2 of 2 positions shown · non-contrast
Comparison: CT of the chest performed 07/05/2013

CLINICAL DATA: Acute onset of left-sided chest pain. Initial
encounter.

EXAM:
CHEST  2 VIEW

[Series 1: dg chest 2 view · 0.14mm/px · 2 of 2 slices shown]
[im 1/2]
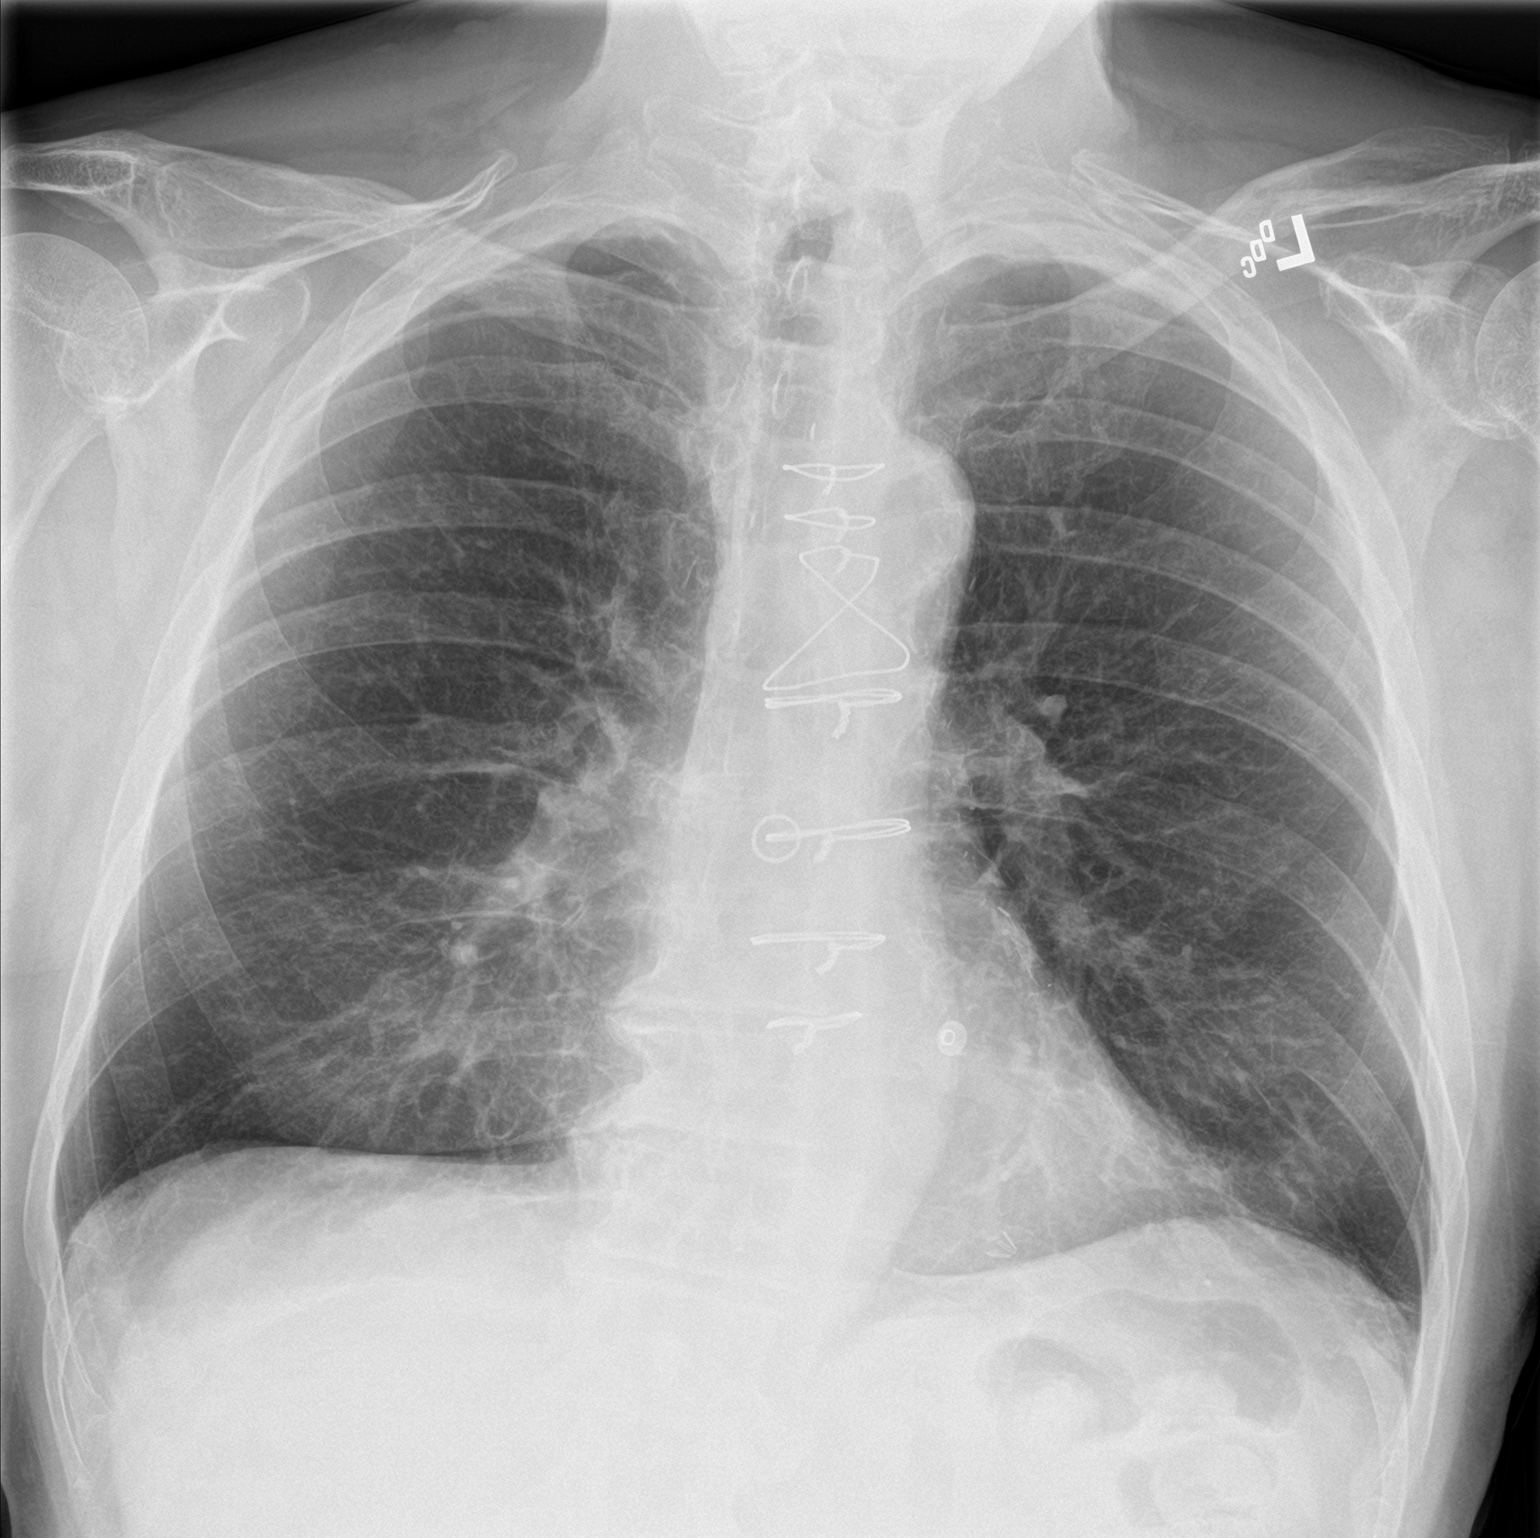
[im 2/2]
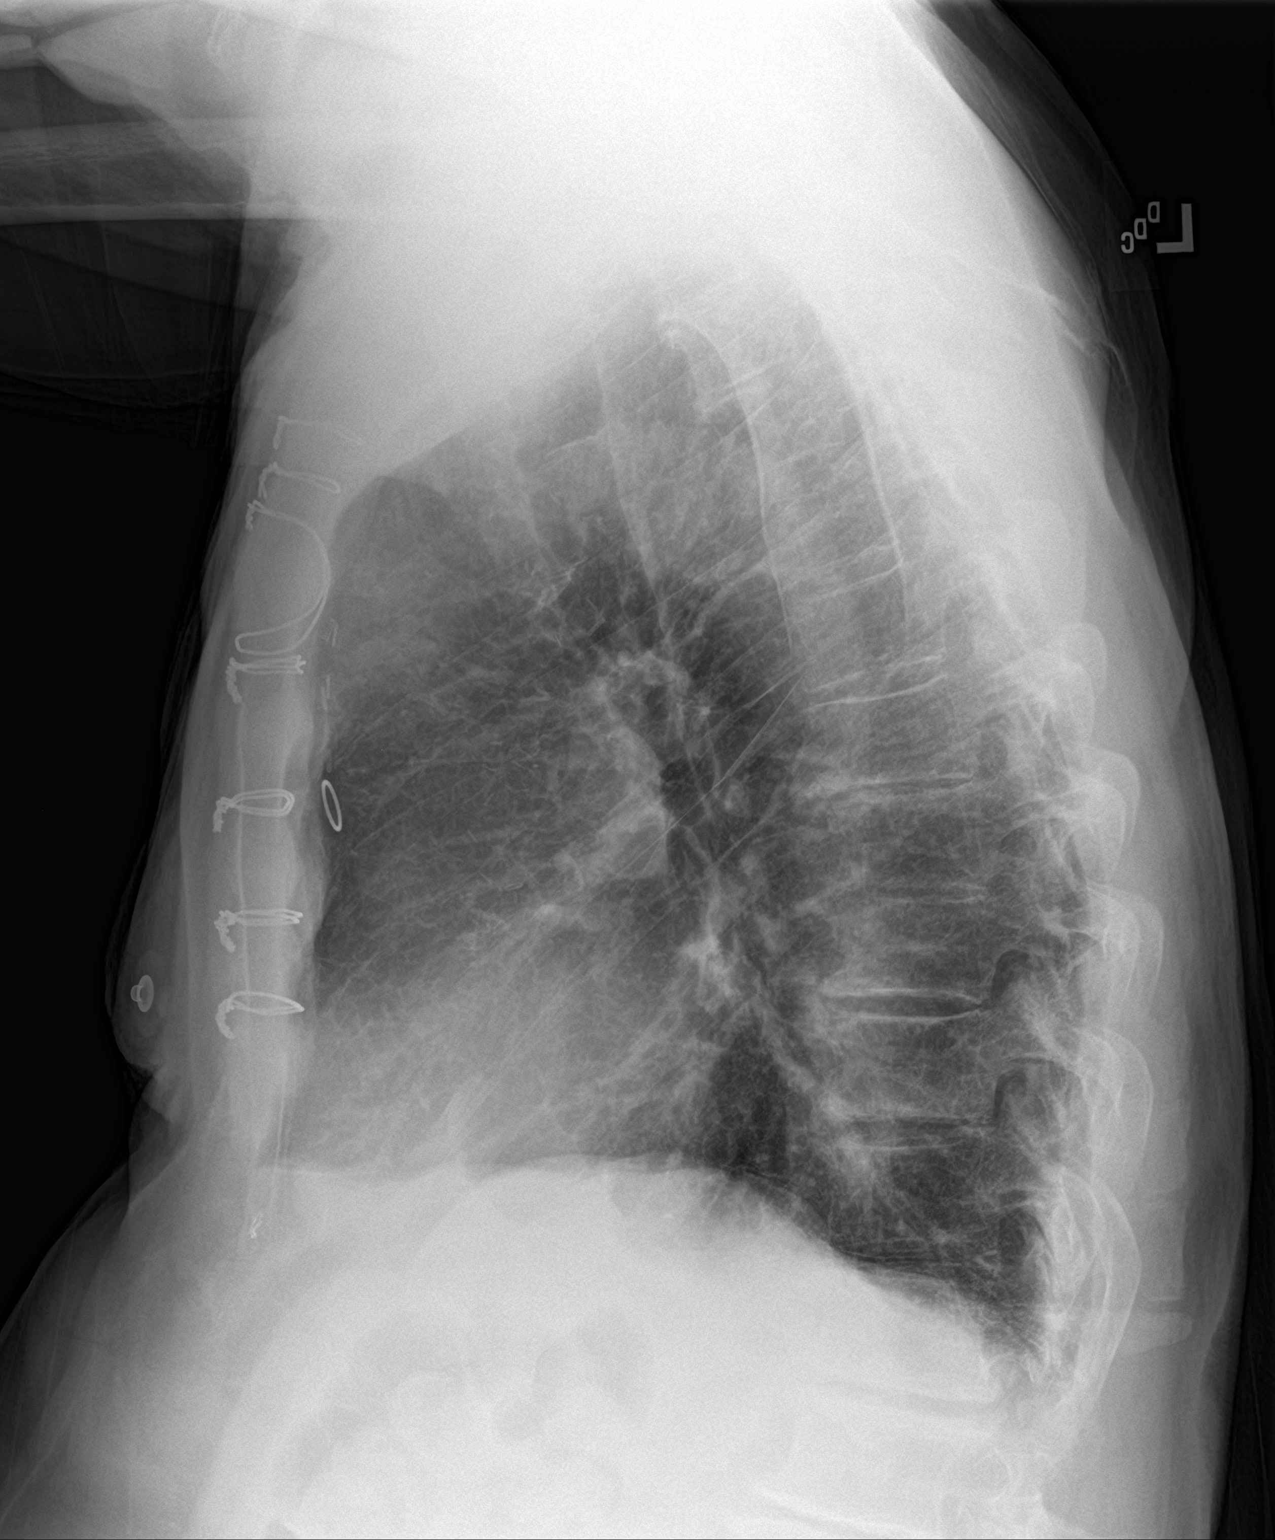

[2 of 2 positions shown; findings below may reference images not displayed]

FINDINGS: The lungs are well-aerated. Mild vascular congestion is noted. There
is no evidence of focal opacification, pleural effusion or
pneumothorax.

The heart is normal in size; the patient is status post median
sternotomy. No acute osseous abnormalities are seen.
IMPRESSION: Mild vascular congestion noted.  Lungs remain grossly clear.

## 2018-03-10 ENCOUNTER — Ambulatory Visit: Payer: Medicare HMO | Admitting: Family Medicine

## 2018-03-10 DIAGNOSIS — R413 Other amnesia: Secondary | ICD-10-CM | POA: Diagnosis not present

## 2018-03-10 DIAGNOSIS — K219 Gastro-esophageal reflux disease without esophagitis: Secondary | ICD-10-CM | POA: Diagnosis not present

## 2018-03-10 DIAGNOSIS — I2510 Atherosclerotic heart disease of native coronary artery without angina pectoris: Secondary | ICD-10-CM | POA: Diagnosis not present

## 2018-03-10 DIAGNOSIS — J309 Allergic rhinitis, unspecified: Secondary | ICD-10-CM | POA: Diagnosis not present

## 2018-03-10 DIAGNOSIS — G629 Polyneuropathy, unspecified: Secondary | ICD-10-CM | POA: Diagnosis not present

## 2018-03-10 DIAGNOSIS — N529 Male erectile dysfunction, unspecified: Secondary | ICD-10-CM | POA: Diagnosis not present

## 2018-03-10 DIAGNOSIS — I252 Old myocardial infarction: Secondary | ICD-10-CM | POA: Diagnosis not present

## 2018-03-10 DIAGNOSIS — R2681 Unsteadiness on feet: Secondary | ICD-10-CM | POA: Diagnosis not present

## 2018-03-10 DIAGNOSIS — Z803 Family history of malignant neoplasm of breast: Secondary | ICD-10-CM | POA: Diagnosis not present

## 2018-03-10 DIAGNOSIS — M810 Age-related osteoporosis without current pathological fracture: Secondary | ICD-10-CM | POA: Diagnosis not present

## 2018-03-15 ENCOUNTER — Encounter: Payer: Self-pay | Admitting: Family Medicine

## 2018-03-15 ENCOUNTER — Ambulatory Visit (INDEPENDENT_AMBULATORY_CARE_PROVIDER_SITE_OTHER): Payer: Medicare HMO | Admitting: Family Medicine

## 2018-03-15 VITALS — BP 142/80 | HR 74 | Temp 97.8°F | Wt 176.2 lb

## 2018-03-15 DIAGNOSIS — G6289 Other specified polyneuropathies: Secondary | ICD-10-CM

## 2018-03-15 DIAGNOSIS — I10 Essential (primary) hypertension: Secondary | ICD-10-CM

## 2018-03-15 DIAGNOSIS — R413 Other amnesia: Secondary | ICD-10-CM

## 2018-03-15 NOTE — Assessment & Plan Note (Signed)
Currently controlled with no medications and diet stable now.

## 2018-03-15 NOTE — Progress Notes (Signed)
BP (!) 142/80   Pulse 74   Temp 97.8 F (36.6 C) (Oral)   Wt 176 lb 3.2 oz (79.9 kg)   SpO2 92%   BMI 25.83 kg/m    Subjective:    Patient ID: Jason Azucena Fallen., male    DOB: [birth year], 85 y.o.   MRN: [id number]  HPI: Jason W Zeven Kocak. is a 85 y.o. male  Chief Complaint  Patient presents with  . Hypertension  Patient doing well with medications no complaints Not taking any blood pressure medicine currently.  Doing well Working with neurology on memory issues taking Aricept without problems. Taking gabapentin for legs restless legs kind of symptoms and not doing much good symptoms are mostly in the early morning.  Discussed changing his dosing time to taking 2 at bedtime to see if that helps. Patient is accompanied by his wife who assists with history.  Relevant past medical, surgical, family and social history reviewed and updated as indicated. Interim medical history since our last visit reviewed. Allergies and medications reviewed and updated.  Review of Systems  Constitutional: Negative.   Respiratory: Negative.   Cardiovascular: Negative.     Per HPI unless specifically indicated above     Objective:    BP (!) 142/80   Pulse 74   Temp 97.8 F (36.6 C) (Oral)   Wt 176 lb 3.2 oz (79.9 kg)   SpO2 92%   BMI 25.83 kg/m   Wt Readings from Last 3 Encounters:  03/15/18 176 lb 3.2 oz (79.9 kg)  09/07/17 171 lb (77.6 kg)  07/28/17 173 lb (78.5 kg)    Physical Exam Constitutional:      Appearance: He is well-developed.  HENT:     Head: Normocephalic and atraumatic.  Eyes:     Conjunctiva/sclera: Conjunctivae normal.  Neck:     Musculoskeletal: Normal range of motion.  Cardiovascular:     Rate and Rhythm: Normal rate and regular rhythm.     Heart sounds: Normal heart sounds.  Pulmonary:     Effort: Pulmonary effort is normal.     Breath sounds: Normal breath sounds.  Musculoskeletal: Normal range of motion.  Skin:    Findings: No erythema.    Neurological:     Mental Status: He is alert and oriented to person, place, and time.  Psychiatric:        Behavior: Behavior normal.        Thought Content: Thought content normal.        Judgment: Judgment normal.     Results for orders placed or performed in visit on 76/28/31  Basic metabolic panel  Result Value Ref Range   Glucose 94 65 - 99 mg/dL   BUN 20 8 - 27 mg/dL   Creatinine, Ser 1.45 (H) 0.76 - 1.27 mg/dL   GFR calc non Af Amer 43 (L) >59 mL/min/1.73   GFR calc Af Amer 50 (L) >59 mL/min/1.73   BUN/Creatinine Ratio 14 10 - 24   Sodium 142 134 - 144 mmol/L   Potassium 4.2 3.5 - 5.2 mmol/L   Chloride 104 96 - 106 mmol/L   CO2 21 20 - 29 mmol/L   Calcium 9.3 8.6 - 10.2 mg/dL      Assessment & Plan:   Problem List Items Addressed This Visit      Cardiovascular and Mediastinum   Hypertension - Primary    Currently controlled with no medications and diet stable now.      Relevant Orders  Basic metabolic panel     Nervous and Auditory   Peripheral neuropathy    Discussed gabapentin and to double up at bedtime        Other   Loss of memory    Followed by neurology and worsening slightly          Follow up plan: Return in about 6 months (around 09/13/2018) for Physical Exam.

## 2018-03-15 NOTE — Assessment & Plan Note (Signed)
Followed by neurology and worsening slightly

## 2018-03-15 NOTE — Assessment & Plan Note (Signed)
Discussed gabapentin and to double up at bedtime

## 2018-03-16 ENCOUNTER — Encounter: Payer: Self-pay | Admitting: Family Medicine

## 2018-03-16 LAB — BASIC METABOLIC PANEL
BUN / CREAT RATIO: 13 (ref 10–24)
BUN: 20 mg/dL (ref 8–27)
CO2: 22 mmol/L (ref 20–29)
CREATININE: 1.55 mg/dL — AB (ref 0.76–1.27)
Calcium: 9.5 mg/dL (ref 8.6–10.2)
Chloride: 103 mmol/L (ref 96–106)
GFR calc Af Amer: 46 mL/min/{1.73_m2} — ABNORMAL LOW (ref >59)
GFR calc non Af Amer: 40 mL/min/{1.73_m2} — ABNORMAL LOW (ref >59)
GLUCOSE: 96 mg/dL (ref 65–99)
Potassium: 4.5 mmol/L (ref 3.5–5.2)
SODIUM: 140 mmol/L (ref 134–144)

## 2018-03-17 DIAGNOSIS — L821 Other seborrheic keratosis: Secondary | ICD-10-CM | POA: Diagnosis not present

## 2018-03-17 DIAGNOSIS — Z85828 Personal history of other malignant neoplasm of skin: Secondary | ICD-10-CM | POA: Diagnosis not present

## 2018-03-17 DIAGNOSIS — L570 Actinic keratosis: Secondary | ICD-10-CM | POA: Diagnosis not present

## 2018-03-17 DIAGNOSIS — L578 Other skin changes due to chronic exposure to nonionizing radiation: Secondary | ICD-10-CM | POA: Diagnosis not present

## 2018-04-11 DIAGNOSIS — R251 Tremor, unspecified: Secondary | ICD-10-CM | POA: Diagnosis not present

## 2018-04-11 DIAGNOSIS — R4701 Aphasia: Secondary | ICD-10-CM | POA: Diagnosis not present

## 2018-04-11 DIAGNOSIS — R262 Difficulty in walking, not elsewhere classified: Secondary | ICD-10-CM | POA: Diagnosis not present

## 2018-04-11 DIAGNOSIS — R413 Other amnesia: Secondary | ICD-10-CM | POA: Diagnosis not present

## 2018-04-11 DIAGNOSIS — Z8679 Personal history of other diseases of the circulatory system: Secondary | ICD-10-CM | POA: Diagnosis not present

## 2018-04-11 DIAGNOSIS — R399 Unspecified symptoms and signs involving the genitourinary system: Secondary | ICD-10-CM | POA: Diagnosis not present

## 2018-05-02 DIAGNOSIS — I25810 Atherosclerosis of coronary artery bypass graft(s) without angina pectoris: Secondary | ICD-10-CM | POA: Diagnosis not present

## 2018-05-02 DIAGNOSIS — I10 Essential (primary) hypertension: Secondary | ICD-10-CM | POA: Diagnosis not present

## 2018-05-02 DIAGNOSIS — I428 Other cardiomyopathies: Secondary | ICD-10-CM | POA: Diagnosis not present

## 2018-05-02 DIAGNOSIS — E782 Mixed hyperlipidemia: Secondary | ICD-10-CM | POA: Diagnosis not present

## 2018-05-02 DIAGNOSIS — I38 Endocarditis, valve unspecified: Secondary | ICD-10-CM | POA: Diagnosis not present

## 2018-05-10 DIAGNOSIS — I428 Other cardiomyopathies: Secondary | ICD-10-CM | POA: Diagnosis not present

## 2018-05-10 DIAGNOSIS — I25810 Atherosclerosis of coronary artery bypass graft(s) without angina pectoris: Secondary | ICD-10-CM | POA: Diagnosis not present

## 2018-06-22 ENCOUNTER — Telehealth: Payer: Self-pay

## 2018-06-22 NOTE — Telephone Encounter (Signed)
Patient scheduled for an AWV on 06/30/2018 with [facility], Due to Covid-19 pandemic this is unable to be done in office, called patient to see if they are able to do this virtually/telephonically.  Move to 07/04/2018 anytime if he can do on phone.  Direct call back [phone]

## 2018-06-25 ENCOUNTER — Other Ambulatory Visit: Payer: Self-pay | Admitting: Family Medicine

## 2018-06-29 DIAGNOSIS — I10 Essential (primary) hypertension: Secondary | ICD-10-CM | POA: Diagnosis not present

## 2018-06-29 DIAGNOSIS — I25810 Atherosclerosis of coronary artery bypass graft(s) without angina pectoris: Secondary | ICD-10-CM | POA: Diagnosis not present

## 2018-06-29 DIAGNOSIS — I428 Other cardiomyopathies: Secondary | ICD-10-CM | POA: Diagnosis not present

## 2018-06-29 DIAGNOSIS — E782 Mixed hyperlipidemia: Secondary | ICD-10-CM | POA: Diagnosis not present

## 2018-06-30 ENCOUNTER — Telehealth: Payer: Self-pay | Admitting: Family Medicine

## 2018-06-30 ENCOUNTER — Ambulatory Visit: Payer: Self-pay

## 2018-06-30 NOTE — Telephone Encounter (Signed)
Patient showed at office for his AWV. I explained you had tried to reschedule it but was not able to reach patient.  Patients daughter was with him and states he cannot do over the phone visit because he doesn't hear well nor understand. I rescheduled it for 5/21 @ 1 but she said to call to change it if it has to be over the phone.   Thank you

## 2018-07-01 ENCOUNTER — Ambulatory Visit: Payer: Self-pay

## 2018-07-14 ENCOUNTER — Ambulatory Visit (INDEPENDENT_AMBULATORY_CARE_PROVIDER_SITE_OTHER): Payer: Medicare HMO

## 2018-07-14 ENCOUNTER — Other Ambulatory Visit: Payer: Self-pay

## 2018-07-14 VITALS — BP 128/64 | HR 78 | Temp 98.1°F | Resp 15 | Ht 69.0 in | Wt 165.2 lb

## 2018-07-14 DIAGNOSIS — Z0000 Encounter for general adult medical examination without abnormal findings: Secondary | ICD-10-CM | POA: Diagnosis not present

## 2018-07-14 NOTE — Addendum Note (Signed)
Addended by: Tyler Aas A on: 07/14/2018 02:53 PM   Modules accepted: Orders

## 2018-07-14 NOTE — Progress Notes (Signed)
Subjective:   Jason Cross. is a 85 y.o. male who presents for Medicare Annual/Subsequent preventive examination.  Review of Systems:  Cardiac Risk Factors include: advanced age (>60men, >62 women);male gender;hypertension;dyslipidemia     Objective:    Vitals: BP 128/64 (BP Location: Left Arm, Patient Position: Sitting, Cuff Size: Normal)    Pulse 78    Temp 98.1 F (36.7 C) (Temporal)    Resp 15    Ht 5\' 9"  (1.753 m)    Wt 165 lb 3.2 oz (74.9 kg)    SpO2 95%    BMI 24.40 kg/m   Body mass index is 24.4 kg/m.  Advanced Directives 10/12/2017 06/28/2017 05/19/2016 05/05/2013 03/10/2013 03/02/2013  Does Patient Have a Medical Advance Directive? No No No Patient does not have advance directive;Patient would not like information Patient does not have advance directive Patient does not have advance directive  Would patient like information on creating a medical advance directive? No - Patient declined No - Patient declined No - Patient declined - - -  Pre-existing out of facility DNR order (yellow form or pink MOST form) - - - No No No    Tobacco Social History   Tobacco Use  Smoking Status Former Smoker   Years: 40.00   Types: Cigarettes   Last attempt to quit: 02/23/1993   Years since quitting: 25.4  Smokeless Tobacco Never Used     Counseling given: Not Answered   Clinical Intake:  Pre-visit preparation completed: Yes  Pain : No/denies pain     Nutritional Status: BMI of 19-24  Normal Nutritional Risks: None Diabetes: No  How often do you need to have someone help you when you read instructions, pamphlets, or other written materials from your doctor or pharmacy?: 1 - Never  Interpreter Needed?: No  Information entered by :: Ayris Carano,LPN   Past Medical History:  Diagnosis Date   Arthritis    Benign prostate hyperplasia    Cataracts, bilateral    Coarse tremors    L>R hands   COPD (chronic obstructive pulmonary disease) ([place])    first stages    Coronary artery disease    Dizziness    occasional   Gastric ulcer    in the 60's   GERD (gastroesophageal reflux disease)    takes Omeprazole every other day   Hard of hearing    doesn't wear hearing aids   History of colon polyps    History of kidney stones    still one on the left side, low   Hyperlipidemia    takes Simvastatin daily   Hypertension    stopped BP meds, BP under control   Myocardial infarction ([facility]) 2009   Peripheral neuropathy    takes Gabapentin daily   Pneumonia    hx of in the 60's   Sleep apnea    Past Surgical History:  Procedure Laterality Date   BACK SURGERY  2007   COLONOSCOPY     CORONARY ARTERY BYPASS GRAFT  2009   LIMA to LAD, SVG to D1 07/2007   EYE SURGERY Bilateral 2004, 2005   implants   GREEN LIGHT LASER TURP (TRANSURETHRAL RESECTION OF PROSTATE N/A 05/10/2013   Procedure: GREEN LIGHT LASER TURP (TRANSURETHRAL RESECTION OF PROSTATE;  Surgeon: Ardis Hughs, MD;  Location: [facility];  Service: Urology;  Laterality: N/A;   lipoma removed     chest wall   microthermo prostate  2009   [place]   PROSTATE SURGERY  10/14   THORACIC DISCECTOMY N/A 03/10/2013   Procedure: Thoracic twelve-Lumbar one Laminectomy;  Surgeon: Erline Levine, MD;  Location: [facility];  Service: Neurosurgery;  Laterality: N/A;  Thoracic twelve-Lumbar one Laminectomy   TONSILLECTOMY     at age 25   Family History  Problem Relation Age of Onset   Heart disease Father    Hypertension Father    Heart attack Father    Thyroid disease Father    Cancer Sister        breast   Breast cancer Sister    Social History   Socioeconomic History   Marital status: Married    Spouse name: Not on file   Number of children: Not on file   Years of education: Not on file   Highest education level: 11th grade  Occupational History   Occupation: retired   Scientist, product/process development strain: Not hard at all   Food  insecurity:    Worry: Never true    Inability: Never true   Transportation needs:    Medical: No    Non-medical: No  Tobacco Use   Smoking status: Former Smoker    Years: 40.00    Types: Cigarettes    Last attempt to quit: 02/23/1993    Years since quitting: 25.4   Smokeless tobacco: Never Used  Substance and Sexual Activity   Alcohol use: No   Drug use: No   Sexual activity: Not on file  Lifestyle   Physical activity:    Days per week: 0 days    Minutes per session: 0 min   Stress: Not at all  Relationships   Social connections:    Talks on phone: Never    Gets together: Once a week    Attends religious service: More than 4 times per year    Active member of club or organization: Yes    Attends meetings of clubs or organizations: More than 4 times per year    Relationship status: Married  Other Topics Concern   Not on file  Social History Narrative   Not on file    Outpatient Encounter Medications as of 07/14/2018  Medication Sig   acetaminophen (TYLENOL) 500 MG tablet Take 500 mg by mouth every 6 (six) hours as needed.   aspirin EC 81 MG tablet Take 81 mg by mouth every morning.   donepezil (ARICEPT) 5 MG tablet TAKE 1 TABLET BY MOUTH EVERYDAY AT BEDTIME   gabapentin (NEURONTIN) 100 MG capsule 100 mg. Taking once in afternoon, takes 300mg  in am and pm   gabapentin (NEURONTIN) 300 MG capsule Take 1 capsule (300 mg total) by mouth 2 (two) times daily. (Patient taking differently: Take 300 mg by mouth 2 (two) times daily. Taking 100mg  at lunch time)   omeprazole (PRILOSEC OTC) 20 MG tablet Take 10 mg by mouth every other day. As needed   No facility-administered encounter medications on file as of 07/14/2018.     Activities of Daily Living In your present state of health, do you have any difficulty performing the following activities: 07/14/2018 09/07/2017  Hearing? Tempie Donning  Comment needs hearing aids  -  Vision? Y Y  Difficulty concentrating or making  decisions? Tempie Donning  Walking or climbing stairs? N Y  Dressing or bathing? N Y  Doing errands, shopping? N Y  Conservation officer, nature and eating ? N -  Using the Toilet? N -  In the past six months, have you accidently leaked urine? N -  Do you have problems with loss of bowel control? N -  Managing your Medications? N -  Managing your Finances? N -  Housekeeping or managing your Housekeeping? 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  Exercise Activities and Dietary recommendations Current Exercise Habits: The patient does not participate in regular exercise at present, Exercise limited by: None identified  Goals     DIET - INCREASE WATER INTAKE     Recommend drinking at least 6-8 glasses of water a day        Fall Risk: Fall Risk  09/07/2017 06/28/2017 02/04/2017 01/26/2017 07/21/2016  Falls in the past year? No Yes Yes No No  Number falls in past yr: - 2 or more 1 - -  Injury with Fall? - No No - -  Risk Factor Category  - High Fall Risk - - -  Risk for fall due to : - Impaired balance/gait History of fall(s) - -  Follow up - Falls prevention discussed Falls evaluation completed - -    FALL RISK PREVENTION PERTAINING TO THE HOME:  Any stairs in or around the home? Yes  If so, are there any without handrails? No   Home free of loose throw rugs in walkways, pet beds, electrical cords, etc? Yes  Adequate lighting in your home to reduce risk of falls? Yes   ASSISTIVE DEVICES UTILIZED TO PREVENT FALLS:  Life alert? No  Use of a cane, walker or w/c? Yes  cane  Grab bars in the bathroom? Yes  Shower chair or bench in shower? No  Elevated toilet  seat or a handicapped toilet? No   TIMED UP AND GO:  Was the test performed? Yes .  Length of time to ambulate 10 feet: 11 sec.   GAIT:  Appearance of gait: Gait steady and fast with the use of an assistive device.  Education: Fall risk prevention has been discussed.  Intervention(s) required? No  DME/home health order needed?  No   Depression Screen PHQ 2/9 Scores 06/28/2017 01/26/2017 07/21/2016 07/17/2015  PHQ - 2 Score 1 0 0 0  PHQ- 9 Score 13 - - 4    Cognitive Function MMSE - Mini Mental State Exam 07/28/2017  Orientation to time 4  Orientation to Place 5  Registration 3  Attention/ Calculation 0  Attention/Calculation-comments Wouldn't attempt  Recall 2  Language- name 2 objects 2  Language- repeat 1  Language- follow 3 step command 3  Language- read & follow direction 1  Write a sentence 1  Copy design 0  Total score 22     6CIT Screen 07/14/2018 06/28/2017  What Year? 0 points 0 points  What month? 0 points 0 points  What time? 0 points 0 points  Count back from 20 0 points 0 points  Months in reverse 0 points 0 points  Repeat phrase 0 points 0 points  Total Score 0 0    Immunization History  Administered Date(s) Administered   Pneumococcal Conjugate-13 07/04/2014   Pneumococcal Polysaccharide-23 12/30/2015   Zoster 11/15/2006    Qualifies for Shingles Vaccine? Yes  Zostavax completed 11/15/2006. Due for Shingrix. Education has been provided regarding the  importance of this vaccine. Pt has been advised to call insurance company to determine out of pocket expense. Advised may also receive vaccine at local pharmacy or Health Dept. Verbalized acceptance and understanding.  Tdap: patient reported in the last 10 years   Flu Vaccine: due 10/2018  Pneumococcal Vaccine: completed series   Screening Tests Health Maintenance  Topic Date Due   TETANUS/TDAP  03/16/2019 (Originally 10/08/1947)   INFLUENZA VACCINE  09/24/2018   PNA vac Low Risk Adult  Completed     Cancer Screenings:  Colorectal Screening: no longer required   Lung Cancer Screening: (Low Dose CT Chest recommended if Age 67-80 years, 30 pack-year currently smoking OR have quit w/in 15years.) does not qualify.    Additional Screening:  Hepatitis C Screening: does not qualify  Vision Screening: Recommended annual ophthalmology exams for early detection of glaucoma and other disorders of the eye. Is the patient up to date with their annual eye exam?  Yes  Who is the provider or what is the name of the office in which the pt attends annual eye exams? [facility]    Dental Screening: Recommended annual dental exams for proper oral hygiene  Community Resource Referral:  CRR required this visit?  Yes    Needs hearing aids- wanting to see if they can get them through [place]:  I have personally reviewed and addressed the Medicare Annual Wellness questionnaire and have noted the following in the patients chart:  A. Medical and social history B. Use of alcohol, tobacco or illicit drugs  C. Current medications and supplements D. Functional ability and status E.  Nutritional status F.  Physical activity G. Advance directives H. List of other physicians I.  Hospitalizations, surgeries, and ER visits in previous 12 months J.  [place] such as hearing and vision if needed, cognitive and depression L. Referrals and appointments   In addition, I have reviewed and discussed with patient certain preventive protocols, quality metrics, and best practice recommendations. A written personalized care plan for preventive services as well as general preventive health recommendations were provided to patient.   Signed,   Bevelyn Ngo, LPN  9/79/8921 Nurse Health Advisor   Nurse Notes: Patient and his wife feel memory is getting worse. 6cit done and scored 0 but it took about 4 minutes for patient to complete the months

## 2018-07-14 NOTE — Patient Instructions (Signed)
Jason Cross , Thank you for taking time to come for your Medicare Wellness Visit. I appreciate your ongoing commitment to your health goals. Please review the following plan we discussed and let me know if I can assist you in the future.   Screening recommendations/referrals: Colonoscopy: no longer required  Recommended yearly ophthalmology/optometry visit for glaucoma screening and checkup Recommended yearly dental visit for hygiene and checkup  Vaccinations: Influenza vaccine: due 10/2018 Pneumococcal vaccine: completed series  Tdap vaccine: up to date  Shingles vaccine: shingrix eligible, check with your insurance company for coverage     Advanced directives: Advance directive discussed with you today. I have provided a copy for you to complete at home and have notarized. Once this is complete please bring a copy in to our office so we can scan it into your chart.  Conditions/risks identified: Memory issues. Please let us know if this worsens before your next appointment.   Next appointment: Follow up in one year for your annual wellness exam.   Preventive Care 65 Years and Older, Male Preventive care refers to lifestyle choices and visits with your health care provider that can promote health and wellness. What does preventive care include?  A yearly physical exam. This is also called an annual well check.  Dental exams once or twice a year.  Routine eye exams. Ask your health care provider how often you should have your eyes checked.  Personal lifestyle choices, including:  Daily care of your teeth and gums.  Regular physical activity.  Eating a healthy diet.  Avoiding tobacco and drug use.  Limiting alcohol use.  Practicing safe sex.  Taking low doses of aspirin every day.  Taking vitamin and mineral supplements as recommended by your health care provider. What happens during an annual well check? The services and screenings done by your health care provider  during your annual well check will depend on your age, overall health, lifestyle risk factors, and family history of disease. Counseling  Your health care provider may ask you questions about your:  Alcohol use.  Tobacco use.  Drug use.  Emotional well-being.  Home and relationship well-being.  Sexual activity.  Eating habits.  History of falls.  Memory and ability to understand (cognition).  Work and work Statistician. Screening  You may have the following tests or measurements:  Height, weight, and BMI.  Blood pressure.  Lipid and cholesterol levels. These may be checked every 5 years, or more frequently if you are over 43 years old.  Skin check.  Lung cancer screening. You may have this screening every year starting at age 97 if you have a 30-pack-year history of smoking and currently smoke or have quit within the past 15 years.  Fecal occult blood test (FOBT) of the stool. You may have this test every year starting at age 47.  Flexible sigmoidoscopy or colonoscopy. You may have a sigmoidoscopy every 5 years or a colonoscopy every 10 years starting at age 72.  Prostate cancer screening. Recommendations will vary depending on your family history and other risks.  Hepatitis C blood test.  Hepatitis B blood test.  Sexually transmitted disease (STD) testing.  Diabetes screening. This is done by checking your blood sugar (glucose) after you have not eaten for a while (fasting). You may have this done every 1-3 years.  Abdominal aortic aneurysm (AAA) screening. You may need this if you are a current or former smoker.  Osteoporosis. You may be screened starting at age 59 if you  are at high risk. Talk with your health care provider about your test results, treatment options, and if necessary, the need for more tests. Vaccines  Your health care provider may recommend certain vaccines, such as:  Influenza vaccine. This is recommended every year.  Tetanus,  diphtheria, and acellular pertussis (Tdap, Td) vaccine. You may need a Td booster every 10 years.  Zoster vaccine. You may need this after age 38.  Pneumococcal 13-valent conjugate (PCV13) vaccine. One dose is recommended after age 3.  Pneumococcal polysaccharide (PPSV23) vaccine. One dose is recommended after age 79. Talk to your health care provider about which screenings and vaccines you need and how often you need them. This information is not intended to replace advice given to you by your health care provider. Make sure you discuss any questions you have with your health care provider. Document Released: 03/08/2015 Document Revised: 10/30/2015 Document Reviewed: 12/11/2014 Elsevier Interactive Patient Education  2017 [place] Prevention in the Home Falls can cause injuries. They can happen to people of all ages. There are many things you can do to make your home safe and to help prevent falls. What can I do on the outside of my home?  Regularly fix the edges of walkways and driveways and fix any cracks.  Remove anything that might make you trip as you walk through a door, such as a raised step or threshold.  Trim any bushes or trees on the path to your home.  Use bright outdoor lighting.  Clear any walking paths of anything that might make someone trip, such as rocks or tools.  Regularly check to see if handrails are loose or broken. Make sure that both sides of any steps have handrails.  Any raised decks and porches should have guardrails on the edges.  Have any leaves, snow, or ice cleared regularly.  Use sand or salt on walking paths during winter.  Clean up any spills in your garage right away. This includes oil or grease spills. What can I do in the bathroom?  Use night lights.  Install grab bars by the toilet and in the tub and shower. Do not use towel bars as grab bars.  Use non-skid mats or decals in the tub or shower.  If you need to sit down in  the shower, use a plastic, non-slip stool.  Keep the floor dry. Clean up any water that spills on the floor as soon as it happens.  Remove soap buildup in the tub or shower regularly.  Attach bath mats securely with double-sided non-slip rug tape.  Do not have throw rugs and other things on the floor that can make you trip. What can I do in the bedroom?  Use night lights.  Make sure that you have a light by your bed that is easy to reach.  Do not use any sheets or blankets that are too big for your bed. They should not hang down onto the floor.  Have a firm chair that has side arms. You can use this for support while you get dressed.  Do not have throw rugs and other things on the floor that can make you trip. What can I do in the kitchen?  Clean up any spills right away.  Avoid walking on wet floors.  Keep items that you use a lot in easy-to-reach places.  If you need to reach something above you, use a strong step stool that has a grab bar.  Keep electrical cords out  of the way.  Do not use floor polish or wax that makes floors slippery. If you must use wax, use non-skid floor wax.  Do not have throw rugs and other things on the floor that can make you trip. What can I do with my stairs?  Do not leave any items on the stairs.  Make sure that there are handrails on both sides of the stairs and use them. Fix handrails that are broken or loose. Make sure that handrails are as long as the stairways.  Check any carpeting to make sure that it is firmly attached to the stairs. Fix any carpet that is loose or worn.  Avoid having throw rugs at the top or bottom of the stairs. If you do have throw rugs, attach them to the floor with carpet tape.  Make sure that you have a light switch at the top of the stairs and the bottom of the stairs. If you do not have them, ask someone to add them for you. What else can I do to help prevent falls?  Wear shoes that:  Do not have high  heels.  Have rubber bottoms.  Are comfortable and fit you well.  Are closed at the toe. Do not wear sandals.  If you use a stepladder:  Make sure that it is fully opened. Do not climb a closed stepladder.  Make sure that both sides of the stepladder are locked into place.  Ask someone to hold it for you, if possible.  Clearly mark and make sure that you can see:  Any grab bars or handrails.  First and last steps.  Where the edge of each step is.  Use tools that help you move around (mobility aids) if they are needed. These include:  Canes.  Walkers.  Scooters.  Crutches.  Turn on the lights when you go into a dark area. Replace any light bulbs as soon as they burn out.  Set up your furniture so you have a clear path. Avoid moving your furniture around.  If any of your floors are uneven, fix them.  If there are any pets around you, be aware of where they are.  Review your medicines with your doctor. Some medicines can make you feel dizzy. This can increase your chance of falling. Ask your doctor what other things that you can do to help prevent falls. This information is not intended to replace advice given to you by your health care provider. Make sure you discuss any questions you have with your health care provider. Document Released: 12/06/2008 Document Revised: 07/18/2015 Document Reviewed: 03/16/2014 Elsevier Interactive Patient Education  2017 [place] [employer]  1. Use "WARM" strategy.  W= write it down  A= associate it  R= repeat it  M= make a mental note  2.   You can keep a Social worker.  Use a 3-ring notebook with sections for the following: calendar, important names and phone numbers,  medications, doctors' names/phone numbers, lists/reminders, and a section to journal what you did  each day.   3.    Use a calendar to write appointments down.  4.    Write yourself a schedule for the day.  This can be placed on  the calendar or in a separate section of the Memory Notebook.  Keeping a  regular schedule can help memory.  5.    Use medication organizer with sections for each day or morning/evening pills.  You may need help loading it  6.    Keep a basket, or pegboard by the door.  Place items that you need to take out with you in the basket or on the pegboard.  You may also want to  include a message board for reminders.  7.    Use sticky notes.  Place sticky notes with reminders in a place where the task is performed.  For example: " turn off the  stove" placed by the stove, "lock the door" placed on the door at eye level, " take your medications" on  the bathroom mirror or by the place where you normally take your medications.  8.    Use alarms/timers.  Use while cooking to remind yourself to check on food or as a reminder to take your medicine, or as a  reminder to make a call, or as a reminder to perform another task, etc.

## 2018-07-21 ENCOUNTER — Telehealth: Payer: Self-pay

## 2018-07-21 NOTE — Telephone Encounter (Signed)
Copied from [place] [id number]. Topic: Referral - Status >> Jul 21, 2018  3:71 PM Simone Curia D wrote: 0/62/6948 No answer at home phone no voicemail set-up, called cell phone and left message with my name and number to call back. MA

## 2018-07-26 ENCOUNTER — Telehealth: Payer: Self-pay

## 2018-07-26 NOTE — Telephone Encounter (Signed)
Copied from [place] [phone]. Topic: Referral - Status >> Jul 26, 2018  3:96 PM Simone Curia D wrote: 10/01/6482 Spoke with Arther Dames patient's wife about [employer] program for affordable hearing aides.MA

## 2018-07-29 DIAGNOSIS — Z961 Presence of intraocular lens: Secondary | ICD-10-CM | POA: Diagnosis not present

## 2018-07-29 DIAGNOSIS — H353132 Nonexudative age-related macular degeneration, bilateral, intermediate dry stage: Secondary | ICD-10-CM | POA: Diagnosis not present

## 2018-07-29 DIAGNOSIS — H35051 Retinal neovascularization, unspecified, right eye: Secondary | ICD-10-CM | POA: Diagnosis not present

## 2018-08-04 ENCOUNTER — Telehealth: Payer: Self-pay

## 2018-08-04 NOTE — Telephone Encounter (Signed)
08/04/2018 spoke with Claudie Fisherman at [employer] he will contact local club and put in a request for Jason Cross. The local office will call the patient's wife in the next week.  Left  message on wife's voicemail to expect call from the [facility].MA

## 2018-08-08 ENCOUNTER — Encounter (INDEPENDENT_AMBULATORY_CARE_PROVIDER_SITE_OTHER): Payer: Self-pay | Admitting: Ophthalmology

## 2018-08-15 ENCOUNTER — Encounter (INDEPENDENT_AMBULATORY_CARE_PROVIDER_SITE_OTHER): Payer: Medicare HMO | Admitting: Ophthalmology

## 2018-08-15 ENCOUNTER — Other Ambulatory Visit: Payer: Self-pay

## 2018-08-15 DIAGNOSIS — I10 Essential (primary) hypertension: Secondary | ICD-10-CM

## 2018-08-15 DIAGNOSIS — H353211 Exudative age-related macular degeneration, right eye, with active choroidal neovascularization: Secondary | ICD-10-CM

## 2018-08-15 DIAGNOSIS — H43813 Vitreous degeneration, bilateral: Secondary | ICD-10-CM

## 2018-08-15 DIAGNOSIS — H35033 Hypertensive retinopathy, bilateral: Secondary | ICD-10-CM | POA: Diagnosis not present

## 2018-08-15 DIAGNOSIS — H353122 Nonexudative age-related macular degeneration, left eye, intermediate dry stage: Secondary | ICD-10-CM | POA: Diagnosis not present

## 2018-08-19 ENCOUNTER — Telehealth: Payer: Self-pay

## 2018-08-19 NOTE — Telephone Encounter (Signed)
Copied from [place] [id number]. Topic: Referral - Status >> Aug 19, 2018  8:85 PM Simone Curia D wrote: 0/27/7412 Spoke with patient's wife Jazmin Vensel.  Debbie did get a response from the [employer] but they were unable to assist.  I gave Jackelyn Poling the number for the [place] [facility].  She will contact them and also contact the local [place] office again. She has my name and number if further assistance is needed. MA

## 2018-09-07 DIAGNOSIS — I10 Essential (primary) hypertension: Secondary | ICD-10-CM | POA: Diagnosis not present

## 2018-09-07 DIAGNOSIS — M545 Low back pain: Secondary | ICD-10-CM | POA: Diagnosis not present

## 2018-09-07 DIAGNOSIS — R262 Difficulty in walking, not elsewhere classified: Secondary | ICD-10-CM | POA: Diagnosis not present

## 2018-09-07 DIAGNOSIS — G8929 Other chronic pain: Secondary | ICD-10-CM | POA: Diagnosis not present

## 2018-09-07 DIAGNOSIS — R251 Tremor, unspecified: Secondary | ICD-10-CM | POA: Diagnosis not present

## 2018-09-07 DIAGNOSIS — R4701 Aphasia: Secondary | ICD-10-CM | POA: Diagnosis not present

## 2018-09-12 ENCOUNTER — Encounter (INDEPENDENT_AMBULATORY_CARE_PROVIDER_SITE_OTHER): Payer: Medicare HMO | Admitting: Ophthalmology

## 2018-09-12 ENCOUNTER — Other Ambulatory Visit: Payer: Self-pay

## 2018-09-12 DIAGNOSIS — H353211 Exudative age-related macular degeneration, right eye, with active choroidal neovascularization: Secondary | ICD-10-CM

## 2018-09-12 DIAGNOSIS — I10 Essential (primary) hypertension: Secondary | ICD-10-CM

## 2018-09-12 DIAGNOSIS — H353122 Nonexudative age-related macular degeneration, left eye, intermediate dry stage: Secondary | ICD-10-CM | POA: Diagnosis not present

## 2018-09-12 DIAGNOSIS — H35033 Hypertensive retinopathy, bilateral: Secondary | ICD-10-CM

## 2018-09-12 DIAGNOSIS — H43813 Vitreous degeneration, bilateral: Secondary | ICD-10-CM | POA: Diagnosis not present

## 2018-09-13 ENCOUNTER — Telehealth: Payer: Self-pay | Admitting: Family Medicine

## 2018-09-13 NOTE — Telephone Encounter (Signed)
Called pt to let her know appt tomorrow would be by telephone. Pt's wife Jackelyn Poling is wanting to send a message to provider because she doesn't want to mention it in front the pt. She states that when he sweats he gives off a bad odor. She wants to know if this is something she should be worried about. Please advise.

## 2018-09-14 ENCOUNTER — Ambulatory Visit (INDEPENDENT_AMBULATORY_CARE_PROVIDER_SITE_OTHER): Payer: Medicare HMO | Admitting: Family Medicine

## 2018-09-14 ENCOUNTER — Encounter: Payer: Self-pay | Admitting: Family Medicine

## 2018-09-14 ENCOUNTER — Other Ambulatory Visit: Payer: Self-pay

## 2018-09-14 DIAGNOSIS — I2510 Atherosclerotic heart disease of native coronary artery without angina pectoris: Secondary | ICD-10-CM | POA: Diagnosis not present

## 2018-09-14 DIAGNOSIS — F0390 Unspecified dementia without behavioral disturbance: Secondary | ICD-10-CM | POA: Diagnosis not present

## 2018-09-14 DIAGNOSIS — R69 Illness, unspecified: Secondary | ICD-10-CM | POA: Diagnosis not present

## 2018-09-14 DIAGNOSIS — I10 Essential (primary) hypertension: Secondary | ICD-10-CM

## 2018-09-14 DIAGNOSIS — Z7189 Other specified counseling: Secondary | ICD-10-CM

## 2018-09-14 MED ORDER — DONEPEZIL HCL 5 MG PO TABS: 5.0000 mg | ORAL_TABLET | Freq: Every day | ORAL | 4 refills | Status: DC

## 2018-09-14 NOTE — Assessment & Plan Note (Signed)
A voluntary discussion about advanced care planning including explanation and discussion of advanced directives was extentively discussed with the patient.  Explained about the healthcare proxy and living will was reviewed and packet with forms with expiration of how to fill them out was given.  Time spent: Encounter 16+ min individuals present: Patient 

## 2018-09-14 NOTE — Assessment & Plan Note (Addendum)
Discussed dementia will continue Aricept Recommended contacting [place] for resources

## 2018-09-14 NOTE — Progress Notes (Signed)
There were no vitals taken for this visit.   Subjective:    Patient ID: Jason Cobb., male    DOB: May 08, 1928, 85 y.o.   MRN: [id number]  HPI: Jason W Calin Fantroy. is a 85 y.o. male  Med check  Discussed primarily with wife because of patient's dementia is worsening. Patient followed by Dr. Melrose Nakayama for neurology who is prescribing gabapentin for peripheral neuropathy.  Patient otherwise stable does not have any respite care discussed respite care for patient and wife. Discussed perspiration and body odor various solutions. Discussed declining mental status.  Relevant past medical, surgical, family and social history reviewed and updated as indicated. Interim medical history since our last visit reviewed. Allergies and medications reviewed and updated.  Review of Systems  Constitutional: Negative.   HENT: Negative.   Eyes: Negative.   Respiratory: Negative.   Cardiovascular: Negative.   Gastrointestinal: Negative.   Endocrine: Negative.   Genitourinary: Negative.   Musculoskeletal: Negative.   Skin: Negative.   Allergic/Immunologic: Negative.   Neurological: Negative.   Hematological: Negative.   Psychiatric/Behavioral: Negative.     Per HPI unless specifically indicated above     Objective:    There were no vitals taken for this visit.  Wt Readings from Last 3 Encounters:  07/14/18 165 lb 3.2 oz (74.9 kg)  03/15/18 176 lb 3.2 oz (79.9 kg)  09/07/17 171 lb (77.6 kg)    Physical Exam  Results for orders placed or performed in visit on 64/40/34  Basic metabolic panel  Result Value Ref Range   Glucose 96 65 - 99 mg/dL   BUN 20 8 - 27 mg/dL   Creatinine, Ser 1.55 (H) 0.76 - 1.27 mg/dL   GFR calc non Af Amer 40 (L) >59 mL/min/1.73   GFR calc Af Amer 46 (L) >59 mL/min/1.73   BUN/Creatinine Ratio 13 10 - 24   Sodium 140 134 - 144 mmol/L   Potassium 4.5 3.5 - 5.2 mmol/L   Chloride 103 96 - 106 mmol/L   CO2 22 20 - 29 mmol/L   Calcium 9.5 8.6 - 10.2  mg/dL      Assessment & Plan:   Problem List Items Addressed This Visit      Cardiovascular and Mediastinum   Hypertension    The current medical regimen is effective;  continue present plan and medications.       Relevant Orders   Comprehensive metabolic panel   CBC with Differential/Platelet   TSH   Urinalysis, Routine w reflex microscopic   Coronary artery disease    The current medical regimen is effective;  continue present plan and medications.       Relevant Orders   Comprehensive metabolic panel   CBC with Differential/Platelet   TSH   Urinalysis, Routine w reflex microscopic   Lipid panel     Nervous and Auditory   Dementia ([place])    Discussed dementia will continue Aricept Recommended contacting [place] for resources      Relevant Medications   donepezil (ARICEPT) 5 MG tablet   Other Relevant Orders   Comprehensive metabolic panel   CBC with Differential/Platelet   TSH   Urinalysis, Routine w reflex microscopic     Other   Advanced care planning/counseling discussion    A voluntary discussion about advanced care planning including explanation and discussion of advanced directives was extentively discussed with the patient.  Explained about the healthcare proxy and living will was reviewed and packet with forms with expiration  of how to fill them out was given.  Time spent: Encounter 16+ min individuals present: Patient          Follow up plan: Return in about 6 months (around 03/17/2019).

## 2018-09-14 NOTE — Assessment & Plan Note (Signed)
The current medical regimen is effective;  continue present plan and medications.  

## 2018-09-27 ENCOUNTER — Other Ambulatory Visit: Payer: Self-pay

## 2018-09-27 ENCOUNTER — Other Ambulatory Visit: Payer: Medicare HMO

## 2018-09-27 DIAGNOSIS — I10 Essential (primary) hypertension: Secondary | ICD-10-CM

## 2018-09-27 DIAGNOSIS — R69 Illness, unspecified: Secondary | ICD-10-CM | POA: Diagnosis not present

## 2018-09-27 DIAGNOSIS — I2510 Atherosclerotic heart disease of native coronary artery without angina pectoris: Secondary | ICD-10-CM

## 2018-09-27 DIAGNOSIS — F0390 Unspecified dementia without behavioral disturbance: Secondary | ICD-10-CM

## 2018-09-27 LAB — URINALYSIS, ROUTINE W REFLEX MICROSCOPIC
Bilirubin, UA: NEGATIVE
Glucose, UA: NEGATIVE
Ketones, UA: NEGATIVE
Leukocytes,UA: NEGATIVE
Nitrite, UA: NEGATIVE
Protein,UA: NEGATIVE
RBC, UA: NEGATIVE
Specific Gravity, UA: 1.02 (ref 1.005–1.030)
Urobilinogen, Ur: 1 mg/dL (ref 0.2–1.0)
pH, UA: 7 (ref 5.0–7.5)

## 2018-09-28 LAB — CBC WITH DIFFERENTIAL/PLATELET
Basophils Absolute: 0.1 10*3/uL (ref 0.0–0.2)
Basos: 1 %
EOS (ABSOLUTE): 0.2 10*3/uL (ref 0.0–0.4)
Eos: 3 %
Hematocrit: 42.4 % (ref 37.5–51.0)
Hemoglobin: 14 g/dL (ref 13.0–17.7)
Immature Grans (Abs): 0 10*3/uL (ref 0.0–0.1)
Immature Granulocytes: 0 %
Lymphocytes Absolute: 1.9 10*3/uL (ref 0.7–3.1)
Lymphs: 31 %
MCH: 29.2 pg (ref 26.6–33.0)
MCHC: 33 g/dL (ref 31.5–35.7)
MCV: 89 fL (ref 79–97)
Monocytes Absolute: 0.5 10*3/uL (ref 0.1–0.9)
Monocytes: 8 %
Neutrophils Absolute: 3.5 10*3/uL (ref 1.4–7.0)
Neutrophils: 57 %
Platelets: 227 10*3/uL (ref 150–450)
RBC: 4.79 x10E6/uL (ref 4.14–5.80)
RDW: 13.6 % (ref 11.6–15.4)
WBC: 6.2 10*3/uL (ref 3.4–10.8)

## 2018-09-28 LAB — COMPREHENSIVE METABOLIC PANEL
ALT: 14 IU/L (ref 0–44)
AST: 13 IU/L (ref 0–40)
Albumin/Globulin Ratio: 1.6 (ref 1.2–2.2)
Albumin: 4.1 g/dL (ref 3.6–4.6)
Alkaline Phosphatase: 91 IU/L (ref 39–117)
BUN/Creatinine Ratio: 15 (ref 10–24)
BUN: 22 mg/dL (ref 8–27)
Bilirubin Total: 1.2 mg/dL (ref 0.0–1.2)
CO2: 24 mmol/L (ref 20–29)
Calcium: 9.1 mg/dL (ref 8.6–10.2)
Chloride: 106 mmol/L (ref 96–106)
Creatinine, Ser: 1.45 mg/dL — ABNORMAL HIGH (ref 0.76–1.27)
GFR calc Af Amer: 50 mL/min/{1.73_m2} — ABNORMAL LOW (ref >59)
GFR calc non Af Amer: 43 mL/min/{1.73_m2} — ABNORMAL LOW (ref >59)
Globulin, Total: 2.5 g/dL (ref 1.5–4.5)
Glucose: 87 mg/dL (ref 65–99)
Potassium: 4.6 mmol/L (ref 3.5–5.2)
Sodium: 141 mmol/L (ref 134–144)
Total Protein: 6.6 g/dL (ref 6.0–8.5)

## 2018-09-28 LAB — LIPID PANEL
Chol/HDL Ratio: 3.9 ratio (ref 0.0–5.0)
Cholesterol, Total: 177 mg/dL (ref 100–199)
HDL: 45 mg/dL (ref >39)
LDL Calculated: 112 mg/dL — ABNORMAL HIGH (ref 0–99)
Triglycerides: 99 mg/dL (ref 0–149)
VLDL Cholesterol Cal: 20 mg/dL (ref 5–40)

## 2018-09-28 LAB — TSH: TSH: 1.05 u[IU]/mL (ref 0.450–4.500)

## 2018-10-10 ENCOUNTER — Other Ambulatory Visit: Payer: Self-pay

## 2018-10-10 ENCOUNTER — Encounter (INDEPENDENT_AMBULATORY_CARE_PROVIDER_SITE_OTHER): Payer: Medicare HMO | Admitting: Ophthalmology

## 2018-10-10 DIAGNOSIS — H43813 Vitreous degeneration, bilateral: Secondary | ICD-10-CM | POA: Diagnosis not present

## 2018-10-10 DIAGNOSIS — H353122 Nonexudative age-related macular degeneration, left eye, intermediate dry stage: Secondary | ICD-10-CM

## 2018-10-10 DIAGNOSIS — I10 Essential (primary) hypertension: Secondary | ICD-10-CM

## 2018-10-10 DIAGNOSIS — H35033 Hypertensive retinopathy, bilateral: Secondary | ICD-10-CM | POA: Diagnosis not present

## 2018-10-10 DIAGNOSIS — H353211 Exudative age-related macular degeneration, right eye, with active choroidal neovascularization: Secondary | ICD-10-CM

## 2018-10-26 ENCOUNTER — Ambulatory Visit: Payer: Medicare HMO | Admitting: Nurse Practitioner

## 2018-11-04 DIAGNOSIS — H353212 Exudative age-related macular degeneration, right eye, with inactive choroidal neovascularization: Secondary | ICD-10-CM | POA: Diagnosis not present

## 2018-11-04 DIAGNOSIS — Z961 Presence of intraocular lens: Secondary | ICD-10-CM | POA: Diagnosis not present

## 2018-11-04 DIAGNOSIS — H353122 Nonexudative age-related macular degeneration, left eye, intermediate dry stage: Secondary | ICD-10-CM | POA: Diagnosis not present

## 2018-11-14 ENCOUNTER — Other Ambulatory Visit: Payer: Self-pay

## 2018-11-14 ENCOUNTER — Encounter (INDEPENDENT_AMBULATORY_CARE_PROVIDER_SITE_OTHER): Payer: Medicare HMO | Admitting: Ophthalmology

## 2018-11-14 DIAGNOSIS — I10 Essential (primary) hypertension: Secondary | ICD-10-CM | POA: Diagnosis not present

## 2018-11-14 DIAGNOSIS — H353122 Nonexudative age-related macular degeneration, left eye, intermediate dry stage: Secondary | ICD-10-CM

## 2018-11-14 DIAGNOSIS — H43813 Vitreous degeneration, bilateral: Secondary | ICD-10-CM

## 2018-11-14 DIAGNOSIS — H35033 Hypertensive retinopathy, bilateral: Secondary | ICD-10-CM

## 2018-11-14 DIAGNOSIS — H353211 Exudative age-related macular degeneration, right eye, with active choroidal neovascularization: Secondary | ICD-10-CM

## 2018-12-12 ENCOUNTER — Encounter (INDEPENDENT_AMBULATORY_CARE_PROVIDER_SITE_OTHER): Payer: Medicare HMO | Admitting: Ophthalmology

## 2018-12-12 ENCOUNTER — Other Ambulatory Visit: Payer: Self-pay

## 2018-12-12 DIAGNOSIS — H35033 Hypertensive retinopathy, bilateral: Secondary | ICD-10-CM | POA: Diagnosis not present

## 2018-12-12 DIAGNOSIS — H43813 Vitreous degeneration, bilateral: Secondary | ICD-10-CM | POA: Diagnosis not present

## 2018-12-12 DIAGNOSIS — H353122 Nonexudative age-related macular degeneration, left eye, intermediate dry stage: Secondary | ICD-10-CM | POA: Diagnosis not present

## 2018-12-12 DIAGNOSIS — I10 Essential (primary) hypertension: Secondary | ICD-10-CM | POA: Diagnosis not present

## 2018-12-12 DIAGNOSIS — H353211 Exudative age-related macular degeneration, right eye, with active choroidal neovascularization: Secondary | ICD-10-CM | POA: Diagnosis not present

## 2019-01-03 DIAGNOSIS — I25810 Atherosclerosis of coronary artery bypass graft(s) without angina pectoris: Secondary | ICD-10-CM | POA: Diagnosis not present

## 2019-01-03 DIAGNOSIS — I2510 Atherosclerotic heart disease of native coronary artery without angina pectoris: Secondary | ICD-10-CM | POA: Diagnosis not present

## 2019-01-03 DIAGNOSIS — E782 Mixed hyperlipidemia: Secondary | ICD-10-CM | POA: Diagnosis not present

## 2019-01-03 DIAGNOSIS — I428 Other cardiomyopathies: Secondary | ICD-10-CM | POA: Diagnosis not present

## 2019-01-03 DIAGNOSIS — I69320 Aphasia following cerebral infarction: Secondary | ICD-10-CM | POA: Diagnosis not present

## 2019-01-03 DIAGNOSIS — I491 Atrial premature depolarization: Secondary | ICD-10-CM | POA: Diagnosis not present

## 2019-01-30 DIAGNOSIS — G8929 Other chronic pain: Secondary | ICD-10-CM | POA: Diagnosis not present

## 2019-01-30 DIAGNOSIS — R262 Difficulty in walking, not elsewhere classified: Secondary | ICD-10-CM | POA: Diagnosis not present

## 2019-01-30 DIAGNOSIS — Z8679 Personal history of other diseases of the circulatory system: Secondary | ICD-10-CM | POA: Diagnosis not present

## 2019-01-30 DIAGNOSIS — I10 Essential (primary) hypertension: Secondary | ICD-10-CM | POA: Diagnosis not present

## 2019-01-30 DIAGNOSIS — M545 Low back pain: Secondary | ICD-10-CM | POA: Diagnosis not present

## 2019-01-30 DIAGNOSIS — R251 Tremor, unspecified: Secondary | ICD-10-CM | POA: Diagnosis not present

## 2019-01-30 DIAGNOSIS — R413 Other amnesia: Secondary | ICD-10-CM | POA: Diagnosis not present

## 2019-04-27 ENCOUNTER — Ambulatory Visit: Payer: Medicare HMO | Attending: Internal Medicine

## 2019-04-27 ENCOUNTER — Other Ambulatory Visit: Payer: Self-pay

## 2019-04-27 DIAGNOSIS — Z23 Encounter for immunization: Secondary | ICD-10-CM | POA: Insufficient documentation

## 2019-04-27 NOTE — Progress Notes (Signed)
   [id number]  Name:  Jason Cross.    MRN: [id number] DOB: 1929-01-30  04/27/2019  Jason Cross was observed post Covid-19 immunization for 15 minutes without incident. He was provided with Vaccine Information Sheet and instruction to access the V-Safe system.   Jason Cross was instructed to call 911 with any severe reactions post vaccine: Marland Kitchen Difficulty breathing  . Swelling of face and throat  . A fast heartbeat  . A bad rash all over body  . Dizziness and weakness   Immunizations Administered    Name Date Dose VIS Date Route   Pfizer COVID-19 Vaccine 04/27/2019  8:39 AM 0.3 mL 02/03/2019 Intramuscular   Manufacturer: [place]   Lot: [id number]   [place]: [id number]

## 2019-05-10 DIAGNOSIS — H353122 Nonexudative age-related macular degeneration, left eye, intermediate dry stage: Secondary | ICD-10-CM | POA: Diagnosis not present

## 2019-05-10 DIAGNOSIS — H353212 Exudative age-related macular degeneration, right eye, with inactive choroidal neovascularization: Secondary | ICD-10-CM | POA: Diagnosis not present

## 2019-05-10 DIAGNOSIS — Z961 Presence of intraocular lens: Secondary | ICD-10-CM | POA: Diagnosis not present

## 2019-05-20 ENCOUNTER — Ambulatory Visit: Payer: Medicare HMO

## 2019-05-23 ENCOUNTER — Ambulatory Visit: Payer: Medicare HMO | Attending: Internal Medicine

## 2019-05-23 DIAGNOSIS — Z23 Encounter for immunization: Secondary | ICD-10-CM

## 2019-05-23 NOTE — Progress Notes (Signed)
   [id number] Vaccination [facility]  Name:  Jason Cross.    MRN: [id number] DOB: 1928/09/22  05/23/2019  [name] was observed post Covid-19 immunization for 15 minutes without incident. He was provided with Vaccine Information Sheet and instruction to access the V-Safe system.   [name] was instructed to call 911 with any severe reactions post vaccine: Marland Kitchen Difficulty breathing  . Swelling of face and throat  . A fast heartbeat  . A bad rash all over body  . Dizziness and weakness   Immunizations Administered    Name Date Dose VIS Date Route   Pfizer COVID-19 Vaccine 05/23/2019  9:58 AM 0.3 mL 02/03/2019 Intramuscular   Manufacturer: [place]   Lot: [id number]   [place]: [id number]

## 2019-06-26 ENCOUNTER — Telehealth: Payer: Self-pay | Admitting: Family Medicine

## 2019-06-26 ENCOUNTER — Other Ambulatory Visit: Payer: Self-pay

## 2019-06-26 ENCOUNTER — Encounter: Payer: Self-pay | Admitting: Nurse Practitioner

## 2019-06-26 ENCOUNTER — Ambulatory Visit (INDEPENDENT_AMBULATORY_CARE_PROVIDER_SITE_OTHER): Payer: Medicare HMO | Admitting: Nurse Practitioner

## 2019-06-26 VITALS — BP 129/73 | HR 73 | Temp 98.2°F | Wt 165.6 lb

## 2019-06-26 DIAGNOSIS — R82998 Other abnormal findings in urine: Secondary | ICD-10-CM

## 2019-06-26 DIAGNOSIS — K5901 Slow transit constipation: Secondary | ICD-10-CM | POA: Diagnosis not present

## 2019-06-26 DIAGNOSIS — F0390 Unspecified dementia without behavioral disturbance: Secondary | ICD-10-CM

## 2019-06-26 DIAGNOSIS — R69 Illness, unspecified: Secondary | ICD-10-CM | POA: Diagnosis not present

## 2019-06-26 LAB — UA/M W/RFLX CULTURE, ROUTINE
Bilirubin, UA: NEGATIVE
Glucose, UA: NEGATIVE
Ketones, UA: NEGATIVE
Leukocytes,UA: NEGATIVE
Nitrite, UA: NEGATIVE
Protein,UA: NEGATIVE
RBC, UA: NEGATIVE
Specific Gravity, UA: 1.01 (ref 1.005–1.030)
Urobilinogen, Ur: 0.2 mg/dL (ref 0.2–1.0)
pH, UA: 5.5 (ref 5.0–7.5)

## 2019-06-26 MED ORDER — DONEPEZIL HCL 10 MG PO TABS: 10.0000 mg | ORAL_TABLET | Freq: Every day | ORAL | 3 refills | Status: AC

## 2019-06-26 NOTE — Patient Instructions (Signed)

## 2019-06-26 NOTE — Assessment & Plan Note (Signed)
Chronic, progressive.  Discussed with his wife progressive nature of disease process, will trial increase in Donepezil to 10 MG, but discussed with her that it may not offer a lot of benefit, that over time disease continues to progress.  Has follow-up with neurology on May 1st, recommend they maintain this follow-up.  Will place CCM referral to offer services, may benefit from this.  Monitor closely for falls and consider return of PT in home, although his wife reports he never participates with this.  Return in 2 months.

## 2019-06-26 NOTE — Chronic Care Management (AMB) (Signed)
  Chronic Care Management   Note  06/26/2019 Name: Jason Cross. MRN: [id number] DOB: December 29, 1928  [name]. is a 86 y.o. year old male who is a primary care patient of Crissman, Jeannette How, MD. I reached out to [place]. by phone today in response to a referral sent by Mr. Monti Ashok Cordia Jr.'s PCP, Marnee Guarneri NP     Mr. Sprinkle was given information about Chronic Care Management services today including:  1. CCM service includes personalized support from designated clinical staff supervised by his physician, including individualized plan of care and coordination with other care providers 2. 24/7 contact phone numbers for assistance for urgent and routine care needs. 3. Service will only be billed when office clinical staff spend 20 minutes or more in a month to coordinate care. 4. Only one practitioner may furnish and bill the service in a calendar month. 5. The patient may stop CCM services at any time (effective at the end of the month) by phone call to the office staff. 6. The patient will be responsible for cost sharing (co-pay) of up to 20% of the service fee (after annual deductible is met).  Patient's spouse did not agree to enrollment in care management services and does not wish to consider at this time.  Follow up plan: The care management team is available to follow up with the patient after provider conversation with the patient regarding recommendation for care management engagement and subsequent re-referral to the care management team.   Glenna Durand, LPN Health Advisor, [place] Management ??[email or web address] ??[phone]

## 2019-06-26 NOTE — Assessment & Plan Note (Signed)
Ongoing, on review of records has dealt with this issue on/off for some time.  Suspect partially due to poor hydration.  Recommend increase hydration during daytime hours, getting at least 8 oz in every couple hours.  Recommend trial of Miralax in morning, this will also offer fluid intake.  Increase fiber in diet at home in foods such as prunes or more deep green leafy vegetables.  Return to office in 2 months, sooner if any worsening symptoms.

## 2019-06-26 NOTE — Assessment & Plan Note (Addendum)
Recently noted last week, but improving at home with increased hydration.  UA today unremarkable.  Recommend continued increase fluid intake at home.  To avoid accidents or frequent trips to bathroom at night, recommend stopping fluid intake a couple hours before bed.  No prostate present per patient and wife report.  Obtain BMP today to check kidney function.  Continue to monitor at home and if return of symptoms immediately report to office.

## 2019-06-26 NOTE — Progress Notes (Signed)
BP 129/73 (BP Location: Left Arm, Cuff Size: Normal)   Pulse 73   Temp 98.2 F (36.8 C) (Oral)   Wt 165 lb 9.6 oz (75.1 kg)   SpO2 96%   BMI 24.45 kg/m    Subjective:    Patient ID: Jason Cross., male    DOB: Oct 03, 1928, 86 y.o.   MRN: [id number]  HPI: Jason Cross. is a 86 y.o. male  Chief Complaint  Patient presents with  . Constipation    pt states he has been having constipation issues for the last 2 weeks. last BM was 2 days ago, almost normal per patient. States he had a runny, pasty like BM last Tuesday   . Urinary Tract Infection    pt states his urine has been dark for the past week, states he has had urgency and burning for the last few days as well    Wife at bedside to assist with HPI due to patient with dementia.  URINARY SYMPTOMS Last week had urinary symptoms, "strong" and frequency.  Has had 3 falls, last one on Monday.  No injuries.  At baseline has gait issues due to neuropathy and is followed by neurology.  Has been drinking more fluids at home recently and urine is more clear.  At baseline he reports drinking less than 8 oz of water a day, does drink sweet tea with meals only.  His wife feels part of this is due to his dementia, she feels dementia is worsening and would like to increase his Donepezil.     They report he does not have a prostate, has history of removal. Dysuria: burning sometimes Urinary frequency: yes Urgency: yes Small volume voids: no Symptom severity: no Urinary incontinence: no Foul odor: yes Hematuria: no Abdominal pain: occasional Back pain: no Suprapubic pain/pressure: no Flank pain: no Fever:  no Vomiting: no Status: stable Previous urinary tract infection: no Recurrent urinary tract infection: no Sexual activity: No sexually active History of sexually transmitted disease: no Penile discharge: no Treatments attempted: increasing fluids   CONSTIPATION Started about 2-3 weeks ago.  If he eats a lot of  chocolate this constipates him and he had some chocolate.  Last Tuesday and Wednesday had diarrhea really bad per his wife's report.  It was pasty and sticky.  Since that time patient reports going on Saturday, he reports it was not too hard to pass that day and it was 1/2 normal formation.  Had another BM yesterday and this one was close to normal, did have to strain with this.   At baseline has a BM every 2-3 days -- patient reports straining with this every time.   Duration:weeks Status: stable Treatments attempted: none Fever: no Nausea: no Vomiting: no Weight loss: no Decreased appetite: not eating as much, has macular degeneration, hard to see food on plate per his wife report and this frustrates patient Diarrhea: no Constipation: yes Blood in stool: no Heartburn: no Jaundice: no Rash: no Dysuria/urinary frequency: yes Hematuria: no History of sexually transmitted disease: no Recurrent NSAID use: no  Relevant past medical, surgical, family and social history reviewed and updated as indicated. Interim medical history since our last visit reviewed. Allergies and medications reviewed and updated.  Review of Systems  Constitutional: Negative for activity change, diaphoresis, fatigue and fever.  Respiratory: Negative for cough, chest tightness, shortness of breath and wheezing.   Cardiovascular: Negative for chest pain, palpitations and leg swelling.  Gastrointestinal: Positive for constipation. Negative for  abdominal distention, abdominal pain, diarrhea, nausea and vomiting.  Musculoskeletal: Negative.   Skin: Negative.   Neurological: Negative.   Psychiatric/Behavioral: Negative.     Per HPI unless specifically indicated above     Objective:    BP 129/73 (BP Location: Left Arm, Cuff Size: Normal)   Pulse 73   Temp 98.2 F (36.8 C) (Oral)   Wt 165 lb 9.6 oz (75.1 kg)   SpO2 96%   BMI 24.45 kg/m   Wt Readings from Last 3 Encounters:  06/26/19 165 lb 9.6 oz (75.1 kg)    07/14/18 165 lb 3.2 oz (74.9 kg)  03/15/18 176 lb 3.2 oz (79.9 kg)    Physical Exam Vitals and nursing note reviewed.  Constitutional:      General: He is awake. He is not in acute distress.    Appearance: He is well-developed and well-groomed. He is not ill-appearing.  HENT:     Head: Normocephalic and atraumatic.     Right Ear: Hearing normal. No drainage.     Left Ear: Hearing normal. No drainage.  Eyes:     General: Lids are normal.        Right eye: No discharge.        Left eye: No discharge.     Conjunctiva/sclera: Conjunctivae normal.     Pupils: Pupils are equal, round, and reactive to light.  Neck:     Thyroid: No thyromegaly.     Vascular: No carotid bruit.  Cardiovascular:     Rate and Rhythm: Normal rate and regular rhythm.     Heart sounds: Normal heart sounds, S1 normal and S2 normal. No murmur. No gallop.   Pulmonary:     Effort: Pulmonary effort is normal. No accessory muscle usage or respiratory distress.     Breath sounds: Normal breath sounds.  Abdominal:     General: Bowel sounds are normal. There is no distension.     Palpations: Abdomen is soft. There is no hepatomegaly or splenomegaly.     Tenderness: There is no abdominal tenderness. There is no right CVA tenderness or left CVA tenderness.     Hernia: No hernia is present.  Musculoskeletal:        General: Normal range of motion.     Cervical back: Normal range of motion and neck supple.     Right lower leg: No edema.     Left lower leg: No edema.  Skin:    General: Skin is warm and dry.     Capillary Refill: Capillary refill takes less than 2 seconds.  Neurological:     Mental Status: He is alert.     Deep Tendon Reflexes: Reflexes are normal and symmetric.     Comments: Pleasantly confused (dementia).  Psychiatric:        Attention and Perception: Attention normal.        Mood and Affect: Mood normal.        Speech: Speech normal.        Behavior: Behavior normal. Behavior is cooperative.     Results for orders placed or performed in visit on 09/27/18  Lipid panel  Result Value Ref Range   Cholesterol, Total 177 100 - 199 mg/dL   Triglycerides 99 0 - 149 mg/dL   HDL 45 >39 mg/dL   VLDL Cholesterol Cal 20 5 - 40 mg/dL   LDL Calculated 112 (H) 0 - 99 mg/dL   Chol/HDL Ratio 3.9 0.0 - 5.0 ratio  Urinalysis, Routine w reflex microscopic  Result Value Ref Range   Specific Gravity, UA 1.020 1.005 - 1.030   pH, UA 7.0 5.0 - 7.5   Color, UA Yellow Yellow   Appearance Ur Clear Clear   Leukocytes,UA Negative Negative   Protein,UA Negative Negative/Trace   Glucose, UA Negative Negative   Ketones, UA Negative Negative   RBC, UA Negative Negative   Bilirubin, UA Negative Negative   Urobilinogen, Ur 1.0 0.2 - 1.0 mg/dL   Nitrite, UA Negative Negative  TSH  Result Value Ref Range   TSH 1.050 0.450 - 4.500 uIU/mL  CBC with Differential/Platelet  Result Value Ref Range   WBC 6.2 3.4 - 10.8 x10E3/uL   RBC 4.79 4.14 - 5.80 x10E6/uL   Hemoglobin 14.0 13.0 - 17.7 g/dL   Hematocrit 42.4 37.5 - 51.0 %   MCV 89 79 - 97 fL   MCH 29.2 26.6 - 33.0 pg   MCHC 33.0 31.5 - 35.7 g/dL   RDW 13.6 11.6 - 15.4 %   Platelets 227 150 - 450 x10E3/uL   Neutrophils 57 Not Estab. %   Lymphs 31 Not Estab. %   Monocytes 8 Not Estab. %   Eos 3 Not Estab. %   Basos 1 Not Estab. %   Neutrophils Absolute 3.5 1.4 - 7.0 x10E3/uL   Lymphocytes Absolute 1.9 0.7 - 3.1 x10E3/uL   Monocytes Absolute 0.5 0.1 - 0.9 x10E3/uL   EOS (ABSOLUTE) 0.2 0.0 - 0.4 x10E3/uL   Basophils Absolute 0.1 0.0 - 0.2 x10E3/uL   Immature Granulocytes 0 Not Estab. %   Immature Grans (Abs) 0.0 0.0 - 0.1 x10E3/uL  Comprehensive metabolic panel  Result Value Ref Range   Glucose 87 65 - 99 mg/dL   BUN 22 8 - 27 mg/dL   Creatinine, Ser 1.45 (H) 0.76 - 1.27 mg/dL   GFR calc non Af Amer 43 (L) >59 mL/min/1.73   GFR calc Af Amer 50 (L) >59 mL/min/1.73   BUN/Creatinine Ratio 15 10 - 24   Sodium 141 134 - 144 mmol/L   Potassium  4.6 3.5 - 5.2 mmol/L   Chloride 106 96 - 106 mmol/L   CO2 24 20 - 29 mmol/L   Calcium 9.1 8.6 - 10.2 mg/dL   Total Protein 6.6 6.0 - 8.5 g/dL   Albumin 4.1 3.6 - 4.6 g/dL   Globulin, Total 2.5 1.5 - 4.5 g/dL   Albumin/Globulin Ratio 1.6 1.2 - 2.2   Bilirubin Total 1.2 0.0 - 1.2 mg/dL   Alkaline Phosphatase 91 39 - 117 IU/L   AST 13 0 - 40 IU/L   ALT 14 0 - 44 IU/L      Assessment & Plan:   Problem List Items Addressed This Visit      Digestive   Constipation due to slow transit    Ongoing, on review of records has dealt with this issue on/off for some time.  Suspect partially due to poor hydration.  Recommend increase hydration during daytime hours, getting at least 8 oz in every couple hours.  Recommend trial of Miralax in morning, this will also offer fluid intake.  Increase fiber in diet at home in foods such as prunes or more deep green leafy vegetables.  Return to office in 2 months, sooner if any worsening symptoms.        Nervous and Auditory   Dementia ([place]) - Primary    Chronic, progressive.  Discussed with his wife progressive nature of disease process, will trial increase in Donepezil to 10 MG,  but discussed with her that it may not offer a lot of benefit, that over time disease continues to progress.  Has follow-up with neurology on May 1st, recommend they maintain this follow-up.  Will place CCM referral to offer services, may benefit from this.  Monitor closely for falls and consider return of PT in home, although his wife reports he never participates with this.  Return in 2 months.      Relevant Medications   gabapentin (NEURONTIN) 300 MG capsule   donepezil (ARICEPT) 10 MG tablet     Other   Dark urine    Recently noted last week, but improving at home with increased hydration.  UA today unremarkable.  Recommend continued increase fluid intake at home.  To avoid accidents or frequent trips to bathroom at night, recommend stopping fluid intake a couple hours before  bed.  No prostate present per patient and wife report.  Obtain BMP today to check kidney function.  Continue to monitor at home and if return of symptoms immediately report to office.      Relevant Orders   UA/M w/rflx Culture, Routine   Basic metabolic panel       Follow up plan: Return in about 2 months (around 08/26/2019) for Constipation and dementia.Marland Kitchen

## 2019-06-27 LAB — BASIC METABOLIC PANEL
BUN/Creatinine Ratio: 14 (ref 10–24)
BUN: 20 mg/dL (ref 8–27)
CO2: 23 mmol/L (ref 20–29)
Calcium: 9.2 mg/dL (ref 8.6–10.2)
Chloride: 105 mmol/L (ref 96–106)
Creatinine, Ser: 1.42 mg/dL — ABNORMAL HIGH (ref 0.76–1.27)
GFR calc Af Amer: 51 mL/min/{1.73_m2} — ABNORMAL LOW (ref >59)
GFR calc non Af Amer: 44 mL/min/{1.73_m2} — ABNORMAL LOW (ref >59)
Glucose: 78 mg/dL (ref 65–99)
Potassium: 4.8 mmol/L (ref 3.5–5.2)
Sodium: 141 mmol/L (ref 134–144)

## 2019-06-27 NOTE — Progress Notes (Signed)
Please let Jason Cross and his wife know electrolytes were all normal.  Kidney function continues to show some kidney disease stage 3, but no decline in this.  Overall staying stable.  Have a great day!!

## 2019-07-17 ENCOUNTER — Ambulatory Visit (INDEPENDENT_AMBULATORY_CARE_PROVIDER_SITE_OTHER): Payer: Medicare HMO

## 2019-07-17 ENCOUNTER — Other Ambulatory Visit: Payer: Self-pay

## 2019-07-17 VITALS — BP 124/87 | HR 69 | Temp 97.5°F | Wt 165.8 lb

## 2019-07-17 DIAGNOSIS — Z0000 Encounter for general adult medical examination without abnormal findings: Secondary | ICD-10-CM | POA: Diagnosis not present

## 2019-07-17 NOTE — Progress Notes (Signed)
Subjective:   Jason Cross. is a 86 y.o. male who presents for Medicare Annual/Subsequent preventive examination.  Review of Systems:   Cardiac Risk Factors include: advanced age (>45men, >41 women);dyslipidemia;hypertension     Objective:    Vitals: BP 124/87 (BP Location: Left Arm, Cuff Size: Normal)   Pulse 69   Temp (!) 97.5 F (36.4 C) (Temporal)   Wt 165 lb 12.8 oz (75.2 kg)   BMI 24.48 kg/m   Body mass index is 24.48 kg/m.  Advanced Directives 07/17/2019 07/17/2019 10/12/2017 06/28/2017 05/19/2016 05/05/2013 03/10/2013  Does Patient Have a Medical Advance Directive? No Yes No No No Patient does not have advance directive;Patient would not like information Patient does not have advance directive  Does patient want to make changes to medical advance directive? Yes (MAU/Ambulatory/Procedural Areas - Information given) Yes (MAU/Ambulatory/Procedural Areas - Information given) - - - - -  Would patient like information on creating a medical advance directive? - - No - Patient declined No - Patient declined No - Patient declined - -  Pre-existing out of facility DNR order (yellow form or pink MOST form) - - - - - No No    Tobacco Social History   Tobacco Use  Smoking Status Former Smoker  . Years: 40.00  . Types: Cigarettes  . Quit date: 02/23/1993  . Years since quitting: 26.4  Smokeless Tobacco Never Used     Counseling given: Not Answered   Clinical Intake:  Pre-visit preparation completed: Yes  Pain : No/denies pain     Nutritional Risks: None Diabetes: No  How often do you need to have someone help you when you read instructions, pamphlets, or other written materials from your doctor or pharmacy?: 1 - Never  Interpreter Needed?: No  Information entered by :: Zoeya Gramajo,LPN  Past Medical History:  Diagnosis Date  . Arthritis   . Benign prostate hyperplasia   . Cataracts, bilateral   . Coarse tremors    L>R hands  . COPD (chronic obstructive  pulmonary disease) ([place])    first stages  . Coronary artery disease   . Dizziness    occasional  . Gastric ulcer    in the 60's  . GERD (gastroesophageal reflux disease)    takes Omeprazole every other day  . Hard of hearing    doesn't wear hearing aids  . History of colon polyps   . History of kidney stones    still one on the left side, low  . Hyperlipidemia    takes Simvastatin daily  . Hypertension    stopped BP meds, BP under control  . Macular degeneration   . Myocardial infarction ([place]) 2009  . Peripheral neuropathy    takes Gabapentin daily  . Pneumonia    hx of in the 60's  . Sleep apnea    Past Surgical History:  Procedure Laterality Date  . BACK SURGERY  2007  . COLONOSCOPY    . CORONARY ARTERY BYPASS GRAFT  2009   LIMA to LAD, SVG to D1 07/2007  . EYE SURGERY Bilateral 2004, 2005   implants  . GREEN LIGHT LASER TURP (TRANSURETHRAL RESECTION OF PROSTATE N/A 05/10/2013   Procedure: GREEN LIGHT LASER TURP (TRANSURETHRAL RESECTION OF PROSTATE;  Surgeon: Ardis Hughs, MD;  Location: [facility];  Service: Urology;  Laterality: N/A;  . lipoma removed     chest wall  . microthermo prostate  2009  . [place]  . PROSTATE SURGERY  10/14  .  THORACIC DISCECTOMY N/A 03/10/2013   Procedure: Thoracic twelve-Lumbar one Laminectomy;  Surgeon: Erline Levine, MD;  Location: [facility];  Service: Neurosurgery;  Laterality: N/A;  Thoracic twelve-Lumbar one Laminectomy  . TONSILLECTOMY     at age 77   Family History  Problem Relation Age of Onset  . Heart disease Father   . Hypertension Father   . Heart attack Father   . Thyroid disease Father   . Cancer Sister        breast  . Breast cancer Sister    Social History   Socioeconomic History  . Marital status: Married    Spouse name: Not on file  . Number of children: Not on file  . Years of education: Not on file  . Highest education level: 11th grade  Occupational History  . Occupation: retired     Tobacco Use  . Smoking status: Former Smoker    Years: 40.00    Types: Cigarettes    Quit date: 02/23/1993    Years since quitting: 26.4  . Smokeless tobacco: Never Used  Substance and Sexual Activity  . Alcohol use: No  . Drug use: No  . Sexual activity: Not on file  Other Topics Concern  . Not on file  Social History Narrative  . Not on file   Social Determinants of Health   Financial Resource Strain:   . Difficulty of Paying Living Expenses:   Food Insecurity:   . Worried About Charity fundraiser in the Last Year:   . Arboriculturist in the Last Year:   Transportation Needs:   . Film/video editor (Medical):   Marland Kitchen Lack of Transportation (Non-Medical):   Physical Activity:   . Days of Exercise per Week:   . Minutes of Exercise per Session:   Stress:   . Feeling of Stress :   Social Connections:   . Frequency of Communication with Friends and Family:   . Frequency of Social Gatherings with Friends and Family:   . Attends Religious Services:   . Active Member of Clubs or Organizations:   . Attends Archivist Meetings:   Marland Kitchen Marital Status:     Outpatient Encounter Medications as of 07/17/2019  Medication Sig  . acetaminophen (TYLENOL) 500 MG tablet Take 500 mg by mouth every 6 (six) hours as needed.  Marland Kitchen aspirin EC 81 MG tablet Take 81 mg by mouth every morning.  . donepezil (ARICEPT) 10 MG tablet Take 1 tablet (10 mg total) by mouth at bedtime.  . gabapentin (NEURONTIN) 300 MG capsule Take 1 capsule by mouth 3 (three) times daily.  Marland Kitchen losartan (COZAAR) 25 MG tablet Take 25 mg by mouth daily.  . Multiple Vitamins-Minerals (PRESERVISION AREDS 2 PO) Take 2 tablets by mouth daily.  Marland Kitchen omeprazole (PRILOSEC OTC) 20 MG tablet Take 10 mg by mouth every other day. As needed  . [DISCONTINUED] donepezil (ARICEPT) 5 MG tablet Take 1 tablet (5 mg total) by mouth at bedtime.  . [DISCONTINUED] gabapentin (NEURONTIN) 100 MG capsule 100 mg. Taking once in afternoon, takes  300mg  in am and pm  . [DISCONTINUED] gabapentin (NEURONTIN) 300 MG capsule Take 1 capsule (300 mg total) by mouth 2 (two) times daily. (Patient not taking: Reported on 06/26/2019)   No facility-administered encounter medications on file as of 07/17/2019.    Activities of Daily Living In your present state of health, do you have any difficulty performing the following activities: 07/17/2019  Hearing? Y  Comment  no hearing aids  Vision? Y  Comment eyeglasses,  macular degenertation, Dr.Woodard, [facility].  Difficulty concentrating or making decisions? Y  Walking or climbing stairs? N  Dressing or bathing? N  Doing errands, shopping? N  Preparing Food and eating ? N  Using the Toilet? N  In the past six months, have you accidently leaked urine? N  Do you have problems with loss of bowel control? N  Managing your Medications? N  Managing your Finances? N  Housekeeping or managing your Housekeeping? N  Some recent data might be hidden    Patient Care Team: Guadalupe Maple, MD as PCP - General (Family Medicine) Christene Lye, MD (General Surgery) Valerie Roys, DO as Referring Physician (Family Medicine) Corey Skains, MD as Consulting Physician (Cardiology) Ralene Bathe, MD (Dermatology)   Assessment:   This is a routine wellness examination for [place].  Exercise Activities and Dietary recommendations Current Exercise Habits: Home exercise routine, Type of exercise: walking, Time (Minutes): 10, Frequency (Times/Week): 7, Weekly Exercise (Minutes/Week): 70, Intensity: Mild, Exercise limited by: None identified  Goals Addressed   None     Fall Risk: Fall Risk  07/17/2019 06/26/2019 09/07/2017 06/28/2017 02/04/2017  Falls in the past year? 1 1 No Yes Yes  Number falls in past yr: 1 1 - 2 or more 1  Comment fell outside in yard, coming into home. - - - -  Injury with Fall? 0 1 - No No  Risk Factor Category  - - - High Fall Risk -  Risk for fall due to :  Impaired balance/gait - - Impaired balance/gait History of fall(s)  Follow up - Falls evaluation completed - Falls prevention discussed Falls evaluation completed    FALL RISK PREVENTION PERTAINING TO THE HOME:  Any stairs in or around the home? Yes  If so, are there any without handrails? No   Home free of loose throw rugs in walkways, pet beds, electrical cords, etc? Yes  Adequate lighting in your home to reduce risk of falls? Yes   ASSISTIVE DEVICES UTILIZED TO PREVENT FALLS:  Life alert? No  Use of a cane, walker or w/c? No  Grab bars in the bathroom? No  Shower chair or bench in shower? No  Elevated toilet seat or a handicapped toilet? Yes   TIMED UP AND GO:  Was the test performed? Yes .  Length of time to ambulate 10 feet: 11 sec.    GAIT:  Appearance of gait:  Gait slow and steady without the use of an assistive device.  Education: Fall risk prevention has been discussed.  Intervention(s) required? Yes , education provided  DME/home health order needed?  No   Depression Screen PHQ 2/9 Scores 07/17/2019 06/28/2017 01/26/2017 07/21/2016  PHQ - 2 Score 1 1 0 0  PHQ- 9 Score - 13 - -    Cognitive Function MMSE - Mini Mental State Exam 07/28/2017  Orientation to time 4  Orientation to Place 5  Registration 3  Attention/ Calculation 0  Attention/Calculation-comments Wouldn't attempt  Recall 2  Language- name 2 objects 2  Language- repeat 1  Language- follow 3 step command 3  Language- read & follow direction 1  Write a sentence 1  Copy design 0  Total score 22     6CIT Screen 07/14/2018 06/28/2017  What Year? 0 points 0 points  What month? 0 points 0 points  What time? 0 points 0 points  Count back from 20 0 points 0  points  Months in reverse 0 points 0 points  Repeat phrase 0 points 0 points  Total Score 0 0    Immunization History  Administered Date(s) Administered  . PFIZER SARS-COV-2 Vaccination 04/27/2019, 05/23/2019  . Pneumococcal Conjugate-13  07/04/2014  . Pneumococcal Polysaccharide-23 12/30/2015  . Zoster 11/15/2006    Qualifies for Shingles Vaccine? Yes  Zostavax completed 2008. Due for Shingrix. Education has been provided regarding the importance of this vaccine. Pt has been advised to call insurance company to determine out of pocket expense. Advised may also receive vaccine at local pharmacy or Health Dept. Verbalized acceptance and understanding.   Tdap: pt reported in the last 10 years   Flu Vaccine: Due 10/2019  Pneumococcal Vaccine: completed series   Covid-19 Vaccine:  Completed vaccines  Screening Tests Health Maintenance  Topic Date Due  . TETANUS/TDAP  07/16/2020 (Originally 10/08/1947)  . INFLUENZA VACCINE  09/24/2019  . COVID-19 Vaccine  Completed  . PNA vac Low Risk Adult  Completed   Cancer Screenings:  Colorectal Screening: no longer required   Lung Cancer Screening: (Low Dose CT Chest recommended if Age 85-80 years, 30 pack-year currently smoking OR have quit w/in 15years.) does not qualify.     Additional Screening:  Hepatitis C Screening: does not qualify  Vision Screening: Recommended annual ophthalmology exams for early detection of glaucoma and other disorders of the eye. Is the patient up to date with their annual eye exam?  Yes  Who is the provider or what is the name of the office in which the pt attends annual eye exams? Dr.Woodard   Dental Screening: Recommended annual dental exams for proper oral hygiene  Community Resource Referral:  CRR required this visit?  No        Plan:  I have personally reviewed and addressed the Medicare Annual Wellness questionnaire and have noted the following in the patient's chart:  A. Medical and social history B. Use of alcohol, tobacco or illicit drugs  C. Current medications and supplements D. Functional ability and status E.  Nutritional status F.  Physical activity G. Advance directives H. List of other physicians I.   Hospitalizations, surgeries, and ER visits in previous 12 months J.  [place] such as hearing and vision if needed, cognitive and depression L. Referrals and appointments   In addition, I have reviewed and discussed with patient certain preventive protocols, quality metrics, and best practice recommendations. A written personalized care plan for preventive services as well as general preventive health recommendations were provided to patient.   Signed,   Bevelyn Ngo, LPN  [id number] Nurse Health Advisor   Nurse Notes: [name] checked BP rechecked manually, 124/87. Patient reminded to make sure to take his medications.

## 2019-07-17 NOTE — Patient Instructions (Signed)
[name] , Thank you for taking time to come for your Medicare Wellness Visit. I appreciate your ongoing commitment to your health goals. Please review the following plan we discussed and let me know if I can assist you in the future.   Screening recommendations/referrals: Colonoscopy: no longer required  Recommended yearly ophthalmology/optometry visit for glaucoma screening and checkup Recommended yearly dental visit for hygiene and checkup  Vaccinations: Influenza vaccine: due 10/2019 Pneumococcal vaccine: completed series  Tdap vaccine: due, check with your insurance for coverage  Shingles vaccine: shingrix eligible, check with insurance for coverage  Covid-19: completed   Advanced directives: Advance directive discussed with you today. I have provided a copy for you to complete at home and have notarized. Once this is complete please bring a copy in to our office so we can scan it into your chart.  Conditions/risks identified: fall risk, discussed prevention measures.   Next appointment: Follow up in one year for your annual wellness visit.   Preventive Care 99 Years and Older, Male Preventive care refers to lifestyle choices and visits with your health care provider that can promote health and wellness. What does preventive care include?  A yearly physical exam. This is also called an annual well check.  Dental exams once or twice a year.  Routine eye exams. Ask your health care provider how often you should have your eyes checked.  Personal lifestyle choices, including:  Daily care of your teeth and gums.  Regular physical activity.  Eating a healthy diet.  Avoiding tobacco and drug use.  Limiting alcohol use.  Practicing safe sex.  Taking low doses of aspirin every day.  Taking vitamin and mineral supplements as recommended by your health care provider. What happens during an annual well check? The services and screenings done by your health care provider  during your annual well check will depend on your age, overall health, lifestyle risk factors, and family history of disease. Counseling  Your health care provider may ask you questions about your:  Alcohol use.  Tobacco use.  Drug use.  Emotional well-being.  Home and relationship well-being.  Sexual activity.  Eating habits.  History of falls.  Memory and ability to understand (cognition).  Work and work Statistician. Screening  You may have the following tests or measurements:  Height, weight, and BMI.  Blood pressure.  Lipid and cholesterol levels. These may be checked every 5 years, or more frequently if you are over 47 years old.  Skin check.  Lung cancer screening. You may have this screening every year starting at age 95 if you have a 30-pack-year history of smoking and currently smoke or have quit within the past 15 years.  Fecal occult blood test (FOBT) of the stool. You may have this test every year starting at age 26.  Flexible sigmoidoscopy or colonoscopy. You may have a sigmoidoscopy every 5 years or a colonoscopy every 10 years starting at age 20.  Prostate cancer screening. Recommendations will vary depending on your family history and other risks.  Hepatitis C blood test.  Hepatitis B blood test.  Sexually transmitted disease (STD) testing.  Diabetes screening. This is done by checking your blood sugar (glucose) after you have not eaten for a while (fasting). You may have this done every 1-3 years.  Abdominal aortic aneurysm (AAA) screening. You may need this if you are a current or former smoker.  Osteoporosis. You may be screened starting at age 92 if you are at high risk. Talk  with your health care provider about your test results, treatment options, and if necessary, the need for more tests. Vaccines  Your health care provider may recommend certain vaccines, such as:  Influenza vaccine. This is recommended every year.  Tetanus,  diphtheria, and acellular pertussis (Tdap, Td) vaccine. You may need a Td booster every 10 years.  Zoster vaccine. You may need this after age 37.  Pneumococcal 13-valent conjugate (PCV13) vaccine. One dose is recommended after age 10.  Pneumococcal polysaccharide (PPSV23) vaccine. One dose is recommended after age 3. Talk to your health care provider about which screenings and vaccines you need and how often you need them. This information is not intended to replace advice given to you by your health care provider. Make sure you discuss any questions you have with your health care provider. Document Released: 03/08/2015 Document Revised: 10/30/2015 Document Reviewed: 12/11/2014 Elsevier Interactive Patient Education  2017 [place] Prevention in the Home Falls can cause injuries. They can happen to people of all ages. There are many things you can do to make your home safe and to help prevent falls. What can I do on the outside of my home?  Regularly fix the edges of walkways and driveways and fix any cracks.  Remove anything that might make you trip as you walk through a door, such as a raised step or threshold.  Trim any bushes or trees on the path to your home.  Use bright outdoor lighting.  Clear any walking paths of anything that might make someone trip, such as rocks or tools.  Regularly check to see if handrails are loose or broken. Make sure that both sides of any steps have handrails.  Any raised decks and porches should have guardrails on the edges.  Have any leaves, snow, or ice cleared regularly.  Use sand or salt on walking paths during winter.  Clean up any spills in your garage right away. This includes oil or grease spills. What can I do in the bathroom?  Use night lights.  Install grab bars by the toilet and in the tub and shower. Do not use towel bars as grab bars.  Use non-skid mats or decals in the tub or shower.  If you need to sit down in  the shower, use a plastic, non-slip stool.  Keep the floor dry. Clean up any water that spills on the floor as soon as it happens.  Remove soap buildup in the tub or shower regularly.  Attach bath mats securely with double-sided non-slip rug tape.  Do not have throw rugs and other things on the floor that can make you trip. What can I do in the bedroom?  Use night lights.  Make sure that you have a light by your bed that is easy to reach.  Do not use any sheets or blankets that are too big for your bed. They should not hang down onto the floor.  Have a firm chair that has side arms. You can use this for support while you get dressed.  Do not have throw rugs and other things on the floor that can make you trip. What can I do in the kitchen?  Clean up any spills right away.  Avoid walking on wet floors.  Keep items that you use a lot in easy-to-reach places.  If you need to reach something above you, use a strong step stool that has a grab bar.  Keep electrical cords out of the way.  Do  not use floor polish or wax that makes floors slippery. If you must use wax, use non-skid floor wax.  Do not have throw rugs and other things on the floor that can make you trip. What can I do with my stairs?  Do not leave any items on the stairs.  Make sure that there are handrails on both sides of the stairs and use them. Fix handrails that are broken or loose. Make sure that handrails are as long as the stairways.  Check any carpeting to make sure that it is firmly attached to the stairs. Fix any carpet that is loose or worn.  Avoid having throw rugs at the top or bottom of the stairs. If you do have throw rugs, attach them to the floor with carpet tape.  Make sure that you have a light switch at the top of the stairs and the bottom of the stairs. If you do not have them, ask someone to add them for you. What else can I do to help prevent falls?  Wear shoes that:  Do not have high  heels.  Have rubber bottoms.  Are comfortable and fit you well.  Are closed at the toe. Do not wear sandals.  If you use a stepladder:  Make sure that it is fully opened. Do not climb a closed stepladder.  Make sure that both sides of the stepladder are locked into place.  Ask someone to hold it for you, if possible.  Clearly mark and make sure that you can see:  Any grab bars or handrails.  First and last steps.  Where the edge of each step is.  Use tools that help you move around (mobility aids) if they are needed. These include:  Canes.  Walkers.  Scooters.  Crutches.  Turn on the lights when you go into a dark area. Replace any light bulbs as soon as they burn out.  Set up your furniture so you have a clear path. Avoid moving your furniture around.  If any of your floors are uneven, fix them.  If there are any pets around you, be aware of where they are.  Review your medicines with your doctor. Some medicines can make you feel dizzy. This can increase your chance of falling. Ask your doctor what other things that you can do to help prevent falls. This information is not intended to replace advice given to you by your health care provider. Make sure you discuss any questions you have with your health care provider. Document Released: 12/06/2008 Document Revised: 07/18/2015 Document Reviewed: 03/16/2014 Elsevier Interactive Patient Education  2017 [employer].

## 2019-07-25 DIAGNOSIS — G8929 Other chronic pain: Secondary | ICD-10-CM | POA: Diagnosis not present

## 2019-07-25 DIAGNOSIS — M545 Low back pain: Secondary | ICD-10-CM | POA: Diagnosis not present

## 2019-07-25 DIAGNOSIS — R413 Other amnesia: Secondary | ICD-10-CM | POA: Diagnosis not present

## 2019-07-25 DIAGNOSIS — E782 Mixed hyperlipidemia: Secondary | ICD-10-CM | POA: Diagnosis not present

## 2019-07-25 DIAGNOSIS — R262 Difficulty in walking, not elsewhere classified: Secondary | ICD-10-CM | POA: Diagnosis not present

## 2019-07-25 DIAGNOSIS — R251 Tremor, unspecified: Secondary | ICD-10-CM | POA: Diagnosis not present

## 2019-07-25 DIAGNOSIS — I25810 Atherosclerosis of coronary artery bypass graft(s) without angina pectoris: Secondary | ICD-10-CM | POA: Diagnosis not present

## 2019-07-25 DIAGNOSIS — G4733 Obstructive sleep apnea (adult) (pediatric): Secondary | ICD-10-CM | POA: Diagnosis not present

## 2019-07-25 DIAGNOSIS — R4701 Aphasia: Secondary | ICD-10-CM | POA: Diagnosis not present

## 2019-07-25 DIAGNOSIS — I10 Essential (primary) hypertension: Secondary | ICD-10-CM | POA: Diagnosis not present

## 2019-08-08 DIAGNOSIS — M4802 Spinal stenosis, cervical region: Secondary | ICD-10-CM | POA: Diagnosis not present

## 2019-08-08 DIAGNOSIS — R69 Illness, unspecified: Secondary | ICD-10-CM | POA: Diagnosis not present

## 2019-08-08 DIAGNOSIS — M48061 Spinal stenosis, lumbar region without neurogenic claudication: Secondary | ICD-10-CM | POA: Diagnosis not present

## 2019-08-08 DIAGNOSIS — I10 Essential (primary) hypertension: Secondary | ICD-10-CM | POA: Diagnosis not present

## 2019-08-08 DIAGNOSIS — I2510 Atherosclerotic heart disease of native coronary artery without angina pectoris: Secondary | ICD-10-CM | POA: Diagnosis not present

## 2019-08-08 DIAGNOSIS — N401 Enlarged prostate with lower urinary tract symptoms: Secondary | ICD-10-CM | POA: Diagnosis not present

## 2019-08-08 DIAGNOSIS — M4805 Spinal stenosis, thoracolumbar region: Secondary | ICD-10-CM | POA: Diagnosis not present

## 2019-08-08 DIAGNOSIS — N39498 Other specified urinary incontinence: Secondary | ICD-10-CM | POA: Diagnosis not present

## 2019-08-08 DIAGNOSIS — G8929 Other chronic pain: Secondary | ICD-10-CM | POA: Diagnosis not present

## 2019-08-08 DIAGNOSIS — M47812 Spondylosis without myelopathy or radiculopathy, cervical region: Secondary | ICD-10-CM | POA: Diagnosis not present

## 2019-08-10 DIAGNOSIS — M4802 Spinal stenosis, cervical region: Secondary | ICD-10-CM | POA: Diagnosis not present

## 2019-08-10 DIAGNOSIS — M48061 Spinal stenosis, lumbar region without neurogenic claudication: Secondary | ICD-10-CM | POA: Diagnosis not present

## 2019-08-10 DIAGNOSIS — M4805 Spinal stenosis, thoracolumbar region: Secondary | ICD-10-CM | POA: Diagnosis not present

## 2019-08-10 DIAGNOSIS — R69 Illness, unspecified: Secondary | ICD-10-CM | POA: Diagnosis not present

## 2019-08-10 DIAGNOSIS — I10 Essential (primary) hypertension: Secondary | ICD-10-CM | POA: Diagnosis not present

## 2019-08-10 DIAGNOSIS — M47812 Spondylosis without myelopathy or radiculopathy, cervical region: Secondary | ICD-10-CM | POA: Diagnosis not present

## 2019-08-10 DIAGNOSIS — G8929 Other chronic pain: Secondary | ICD-10-CM | POA: Diagnosis not present

## 2019-08-11 DIAGNOSIS — I10 Essential (primary) hypertension: Secondary | ICD-10-CM | POA: Diagnosis not present

## 2019-08-11 DIAGNOSIS — R69 Illness, unspecified: Secondary | ICD-10-CM | POA: Diagnosis not present

## 2019-08-11 DIAGNOSIS — M47812 Spondylosis without myelopathy or radiculopathy, cervical region: Secondary | ICD-10-CM | POA: Diagnosis not present

## 2019-08-11 DIAGNOSIS — I2510 Atherosclerotic heart disease of native coronary artery without angina pectoris: Secondary | ICD-10-CM | POA: Diagnosis not present

## 2019-08-11 DIAGNOSIS — M4805 Spinal stenosis, thoracolumbar region: Secondary | ICD-10-CM | POA: Diagnosis not present

## 2019-08-11 DIAGNOSIS — G8929 Other chronic pain: Secondary | ICD-10-CM | POA: Diagnosis not present

## 2019-08-11 DIAGNOSIS — M48061 Spinal stenosis, lumbar region without neurogenic claudication: Secondary | ICD-10-CM | POA: Diagnosis not present

## 2019-08-11 DIAGNOSIS — M4802 Spinal stenosis, cervical region: Secondary | ICD-10-CM | POA: Diagnosis not present

## 2019-08-11 DIAGNOSIS — N39498 Other specified urinary incontinence: Secondary | ICD-10-CM | POA: Diagnosis not present

## 2019-08-11 DIAGNOSIS — N401 Enlarged prostate with lower urinary tract symptoms: Secondary | ICD-10-CM | POA: Diagnosis not present

## 2019-08-14 DIAGNOSIS — G8929 Other chronic pain: Secondary | ICD-10-CM | POA: Diagnosis not present

## 2019-08-14 DIAGNOSIS — I10 Essential (primary) hypertension: Secondary | ICD-10-CM | POA: Diagnosis not present

## 2019-08-14 DIAGNOSIS — M47812 Spondylosis without myelopathy or radiculopathy, cervical region: Secondary | ICD-10-CM | POA: Diagnosis not present

## 2019-08-14 DIAGNOSIS — M4805 Spinal stenosis, thoracolumbar region: Secondary | ICD-10-CM | POA: Diagnosis not present

## 2019-08-14 DIAGNOSIS — I2510 Atherosclerotic heart disease of native coronary artery without angina pectoris: Secondary | ICD-10-CM | POA: Diagnosis not present

## 2019-08-14 DIAGNOSIS — N401 Enlarged prostate with lower urinary tract symptoms: Secondary | ICD-10-CM | POA: Diagnosis not present

## 2019-08-14 DIAGNOSIS — R69 Illness, unspecified: Secondary | ICD-10-CM | POA: Diagnosis not present

## 2019-08-14 DIAGNOSIS — M4802 Spinal stenosis, cervical region: Secondary | ICD-10-CM | POA: Diagnosis not present

## 2019-08-14 DIAGNOSIS — N39498 Other specified urinary incontinence: Secondary | ICD-10-CM | POA: Diagnosis not present

## 2019-08-14 DIAGNOSIS — M48061 Spinal stenosis, lumbar region without neurogenic claudication: Secondary | ICD-10-CM | POA: Diagnosis not present

## 2019-08-16 DIAGNOSIS — I10 Essential (primary) hypertension: Secondary | ICD-10-CM | POA: Diagnosis not present

## 2019-08-16 DIAGNOSIS — N39498 Other specified urinary incontinence: Secondary | ICD-10-CM | POA: Diagnosis not present

## 2019-08-16 DIAGNOSIS — M47812 Spondylosis without myelopathy or radiculopathy, cervical region: Secondary | ICD-10-CM | POA: Diagnosis not present

## 2019-08-16 DIAGNOSIS — R69 Illness, unspecified: Secondary | ICD-10-CM | POA: Diagnosis not present

## 2019-08-16 DIAGNOSIS — G8929 Other chronic pain: Secondary | ICD-10-CM | POA: Diagnosis not present

## 2019-08-16 DIAGNOSIS — I2510 Atherosclerotic heart disease of native coronary artery without angina pectoris: Secondary | ICD-10-CM | POA: Diagnosis not present

## 2019-08-16 DIAGNOSIS — M4802 Spinal stenosis, cervical region: Secondary | ICD-10-CM | POA: Diagnosis not present

## 2019-08-16 DIAGNOSIS — M48061 Spinal stenosis, lumbar region without neurogenic claudication: Secondary | ICD-10-CM | POA: Diagnosis not present

## 2019-08-16 DIAGNOSIS — M4805 Spinal stenosis, thoracolumbar region: Secondary | ICD-10-CM | POA: Diagnosis not present

## 2019-08-16 DIAGNOSIS — N401 Enlarged prostate with lower urinary tract symptoms: Secondary | ICD-10-CM | POA: Diagnosis not present

## 2019-08-21 DIAGNOSIS — M48061 Spinal stenosis, lumbar region without neurogenic claudication: Secondary | ICD-10-CM | POA: Diagnosis not present

## 2019-08-21 DIAGNOSIS — I10 Essential (primary) hypertension: Secondary | ICD-10-CM | POA: Diagnosis not present

## 2019-08-21 DIAGNOSIS — M4802 Spinal stenosis, cervical region: Secondary | ICD-10-CM | POA: Diagnosis not present

## 2019-08-21 DIAGNOSIS — G8929 Other chronic pain: Secondary | ICD-10-CM | POA: Diagnosis not present

## 2019-08-21 DIAGNOSIS — N401 Enlarged prostate with lower urinary tract symptoms: Secondary | ICD-10-CM | POA: Diagnosis not present

## 2019-08-21 DIAGNOSIS — R69 Illness, unspecified: Secondary | ICD-10-CM | POA: Diagnosis not present

## 2019-08-21 DIAGNOSIS — M47812 Spondylosis without myelopathy or radiculopathy, cervical region: Secondary | ICD-10-CM | POA: Diagnosis not present

## 2019-08-21 DIAGNOSIS — M4805 Spinal stenosis, thoracolumbar region: Secondary | ICD-10-CM | POA: Diagnosis not present

## 2019-08-21 DIAGNOSIS — I2510 Atherosclerotic heart disease of native coronary artery without angina pectoris: Secondary | ICD-10-CM | POA: Diagnosis not present

## 2019-08-21 DIAGNOSIS — N39498 Other specified urinary incontinence: Secondary | ICD-10-CM | POA: Diagnosis not present

## 2019-08-23 DIAGNOSIS — I2510 Atherosclerotic heart disease of native coronary artery without angina pectoris: Secondary | ICD-10-CM | POA: Diagnosis not present

## 2019-08-23 DIAGNOSIS — G8929 Other chronic pain: Secondary | ICD-10-CM | POA: Diagnosis not present

## 2019-08-23 DIAGNOSIS — M47812 Spondylosis without myelopathy or radiculopathy, cervical region: Secondary | ICD-10-CM | POA: Diagnosis not present

## 2019-08-23 DIAGNOSIS — N401 Enlarged prostate with lower urinary tract symptoms: Secondary | ICD-10-CM | POA: Diagnosis not present

## 2019-08-23 DIAGNOSIS — I10 Essential (primary) hypertension: Secondary | ICD-10-CM | POA: Diagnosis not present

## 2019-08-23 DIAGNOSIS — M48061 Spinal stenosis, lumbar region without neurogenic claudication: Secondary | ICD-10-CM | POA: Diagnosis not present

## 2019-08-23 DIAGNOSIS — M4802 Spinal stenosis, cervical region: Secondary | ICD-10-CM | POA: Diagnosis not present

## 2019-08-23 DIAGNOSIS — R69 Illness, unspecified: Secondary | ICD-10-CM | POA: Diagnosis not present

## 2019-08-23 DIAGNOSIS — N39498 Other specified urinary incontinence: Secondary | ICD-10-CM | POA: Diagnosis not present

## 2019-08-23 DIAGNOSIS — M4805 Spinal stenosis, thoracolumbar region: Secondary | ICD-10-CM | POA: Diagnosis not present

## 2019-08-29 ENCOUNTER — Other Ambulatory Visit: Payer: Self-pay

## 2019-08-29 ENCOUNTER — Encounter: Payer: Self-pay | Admitting: Nurse Practitioner

## 2019-08-29 ENCOUNTER — Ambulatory Visit (INDEPENDENT_AMBULATORY_CARE_PROVIDER_SITE_OTHER): Payer: Medicare HMO | Admitting: Nurse Practitioner

## 2019-08-29 VITALS — BP 104/66 | HR 76 | Temp 97.8°F | Wt 162.0 lb

## 2019-08-29 DIAGNOSIS — I10 Essential (primary) hypertension: Secondary | ICD-10-CM | POA: Diagnosis not present

## 2019-08-29 DIAGNOSIS — F0390 Unspecified dementia without behavioral disturbance: Secondary | ICD-10-CM

## 2019-08-29 DIAGNOSIS — R300 Dysuria: Secondary | ICD-10-CM | POA: Diagnosis not present

## 2019-08-29 DIAGNOSIS — R82998 Other abnormal findings in urine: Secondary | ICD-10-CM | POA: Diagnosis not present

## 2019-08-29 DIAGNOSIS — R69 Illness, unspecified: Secondary | ICD-10-CM | POA: Diagnosis not present

## 2019-08-29 LAB — UA/M W/RFLX CULTURE, ROUTINE
Bilirubin, UA: NEGATIVE
Glucose, UA: NEGATIVE
Leukocytes,UA: NEGATIVE
Nitrite, UA: NEGATIVE
Protein,UA: NEGATIVE
RBC, UA: NEGATIVE
Specific Gravity, UA: 1.02 (ref 1.005–1.030)
Urobilinogen, Ur: 1 mg/dL (ref 0.2–1.0)
pH, UA: 5.5 (ref 5.0–7.5)

## 2019-08-29 NOTE — Patient Instructions (Signed)
Dementia Caregiver Guide Dementia is a term used to describe a number of symptoms that affect memory and thinking. The most common symptoms include:  Memory loss.  Trouble with language and communication.  Trouble concentrating.  Poor judgment.  Problems with reasoning.  Child-like behavior and language.  Extreme anxiety.  Angry outbursts.  Wandering from home or public places. Dementia usually gets worse slowly over time. In the early stages, people with dementia can stay independent and safe with some help. In later stages, they need help with daily tasks such as dressing, grooming, and using the bathroom. How to help the person with dementia cope Dementia can be frightening and confusing. Here are some tips to help the person with dementia cope with changes caused by the disease. General tips  Keep the person on track with his or her routine.  Try to identify areas where the person may need help.  Be supportive, patient, calm, and encouraging.  Gently remind the person that adjusting to changes takes time.  Help with the tasks that the person has asked for help with.  Keep the person involved in daily tasks and decisions as much as possible.  Encourage conversation, but try not to get frustrated or harried if the person struggles to find words or does not seem to appreciate your help. Communication tips  When the person is talking or seems frustrated, make eye contact and hold the person's hand.  Ask specific questions that need yes or no answers.  Use simple words, short sentences, and a calm voice. Only give one direction at a time.  When offering choices, limit them to just 1 or 2.  Avoid correcting the person in a negative way.  If the person is struggling to find the right words, gently try to help him or her. How to recognize symptoms of stress Symptoms of stress in caregivers include:  Feeling frustrated or angry with the person with  dementia.  Denying that the person has dementia or that his or her symptoms will not improve.  Feeling hopeless and unappreciated.  Difficulty sleeping.  Difficulty concentrating.  Feeling anxious, irritable, or depressed.  Developing stress-related health problems.  Feeling like you have too little time for your own life. Follow these instructions at home:   Make sure that you and the person you are caring for: ? Get regular sleep. ? Exercise regularly. ? Eat regular, nutritious meals. ? Drink enough fluid to keep your urine clear or pale yellow. ? Take over-the-counter and prescription medicines only as told by your health care providers. ? Attend all scheduled health care appointments.  Join a support group with others who are caregivers.  Ask about respite care resources so that you can have a regular break from the stress of caregiving.  Look for signs of stress in yourself and in the person you are caring for. If you notice signs of stress, take steps to manage it.  Consider any safety risks and take steps to avoid them.  Organize medications in a pill box for each day of the week.  Create a plan to handle any legal or financial matters. Get legal or financial advice if needed.  Keep a calendar in a central location to remind the person of appointments or other activities. Tips for reducing the risk of injury  Keep floors clear of clutter. Remove rugs, magazine racks, and floor lamps.  Keep hallways well lit, especially at night.  Put a handrail and nonslip mat in the bathtub or   shower.  Put childproof locks on cabinets that contain dangerous items, such as medicines, alcohol, guns, toxic cleaning items, sharp tools or utensils, matches, and lighters.  Put the locks in places where the person cannot see or reach them easily. This will help ensure that the person does not wander out of the house and get lost.  Be prepared for emergencies. Keep a list of  emergency phone numbers and addresses in a convenient area.  Remove car keys and lock garage doors so that the person does not try to get in the car and drive.  Have the person wear a bracelet that tracks locations and identifies the person as having memory problems. This should be worn at all times for safety. Where to find support: Many individuals and organizations offer support. These include:  Support groups for people with dementia and for caregivers.  Counselors or therapists.  Home health care services.  Adult day care centers. Where to find more information Alzheimer's Association: www.alz.org Contact a health care provider if:  The person's health is rapidly getting worse.  You are no longer able to care for the person.  Caring for the person is affecting your physical and emotional health.  The person threatens himself or herself, you, or anyone else. Summary  Dementia is a term used to describe a number of symptoms that affect memory and thinking.  Dementia usually gets worse slowly over time.  Take steps to reduce the person's risk of injury, and to plan for future care.  Caregivers need support, relief from caregiving, and time for their own lives. This information is not intended to replace advice given to you by your health care provider. Make sure you discuss any questions you have with your health care provider. Document Revised: 01/22/2017 Document Reviewed: 01/14/2016 Elsevier Patient Education  2020 Elsevier Inc.  

## 2019-08-29 NOTE — Assessment & Plan Note (Signed)
Chronic, ongoing, followed by cardiology with recent visit.  Continue current medication regimen at this time, however discussed with his wife holding Losartan if BP <110/60 at home due to concern for orthostatic hypotension and falls in patient dementia and advanced age + poor hydration.  Recommend increase fluid intake at home, getting at least 8 glasses of water a day.  Plan on labs next visit.  Return in 3 months.

## 2019-08-29 NOTE — Assessment & Plan Note (Signed)
Chronic, progressive.  Discussed with his wife progressive nature of disease process, continue Donepezil 10 MG.  Continue to collaborate with neurology.  Will place CCM referral to offer services, may benefit from this.  Monitor closely for falls and consider return of PT in home, although his wife reports he rarely participates with this.  Return in 3 months.

## 2019-08-29 NOTE — Assessment & Plan Note (Signed)
UA today unremarkable with exception of 1 + ketones.  Recommend continued increase fluid intake at home, as has poor fluid intake at baseline.  To avoid accidents or frequent trips to bathroom at night, recommend stopping fluid intake a couple hours before bed.  No prostate present per patient and wife report.  CCM referral to offer caregiver support.

## 2019-08-29 NOTE — Progress Notes (Signed)
BP 104/66 (BP Location: Left Arm)   Pulse 76   Temp 97.8 F (36.6 C) (Oral)   Wt 162 lb (73.5 kg)   SpO2 98%   BMI 23.92 kg/m    Subjective:    Patient ID: Jason Cross., male    DOB: 1929-01-27, 86 y.o.   MRN: [id number]  HPI: Jason Cross. is a 86 y.o. male  Chief Complaint  Patient presents with  . Dementia   DEMENTIA: Is followed by neurology, Dr. Melrose Nakayama, last seen 07/25/2019 -- ordered PT in home and tomorrow is last day.  He is also followed by cardiology for CAD and was last seen 07/25/19 by Dr. Nehemiah Massed, they report no changes were made.  Unable to review notes in Care Everywhere -- not downloading.  Continues on Aricept every day.  His wife reports he fell asleep in chair last night and when his wife tried to wake him up he was very disoriented, but this morning he was back to normal.  No recent falls or injuries.  He eats three meals a day, but this is difficult due to underlying vision issues, dinner being the most difficult.  Weight in January 2020 was 176 and today 162, his wife reports he used to eat double helpings, but does not eat as much as once did.  Not drinking Ensure.  He does not drink much water at home -- little sips.    Has macular degeneration -- last saw eye doctor in February 2021 -- he refused to have any further shots as he felt they were not helping.    URINARY SYMPTOMS Dysuria: no Urinary frequency: yes Urgency: yes Small volume voids: no Symptom severity: no Urinary incontinence: no Foul odor: yes Hematuria: no Abdominal pain: no Back pain: no Suprapubic pain/pressure: no Flank pain: no Fever:  no Vomiting: no Status: stable Previous urinary tract infection: no Recurrent urinary tract infection: no Sexual activity: No sexually active History of sexually transmitted disease: no Penile discharge: no Treatments attempted: increasing fluids   Relevant past medical, surgical, family and social history reviewed and updated as  indicated. Interim medical history since our last visit reviewed. Allergies and medications reviewed and updated.  Review of Systems  Constitutional: Negative for activity change, diaphoresis, fatigue and fever.  Respiratory: Negative for cough, chest tightness, shortness of breath and wheezing.   Cardiovascular: Negative for chest pain, palpitations and leg swelling.  Gastrointestinal: Negative.   Genitourinary: Positive for frequency and urgency. Negative for dysuria and hematuria.  Neurological: Positive for dizziness (occasional with position change). Negative for tremors, seizures, facial asymmetry, weakness, light-headedness, numbness and headaches.  Psychiatric/Behavioral: Negative.     Per HPI unless specifically indicated above     Objective:    BP 104/66 (BP Location: Left Arm)   Pulse 76   Temp 97.8 F (36.6 C) (Oral)   Wt 162 lb (73.5 kg)   SpO2 98%   BMI 23.92 kg/m   Wt Readings from Last 3 Encounters:  08/29/19 162 lb (73.5 kg)  07/17/19 165 lb 12.8 oz (75.2 kg)  06/26/19 165 lb 9.6 oz (75.1 kg)    Physical Exam Vitals and nursing note reviewed.  Constitutional:      General: He is awake. He is not in acute distress.    Appearance: He is well-developed and well-groomed. He is not ill-appearing.  HENT:     Head: Normocephalic and atraumatic.     Right Ear: Hearing normal. No drainage.  Left Ear: Hearing normal. No drainage.  Eyes:     General: Lids are normal.        Right eye: No discharge.        Left eye: No discharge.     Conjunctiva/sclera: Conjunctivae normal.     Pupils: Pupils are equal, round, and reactive to light.  Neck:     Thyroid: No thyromegaly.     Vascular: No carotid bruit.  Cardiovascular:     Rate and Rhythm: Normal rate and regular rhythm.     Heart sounds: Normal heart sounds, S1 normal and S2 normal. No murmur heard.  No gallop.   Pulmonary:     Effort: Pulmonary effort is normal. No accessory muscle usage or respiratory  distress.     Breath sounds: Normal breath sounds.  Abdominal:     General: Bowel sounds are normal. There is no distension.     Palpations: Abdomen is soft. There is no hepatomegaly or splenomegaly.     Tenderness: There is no abdominal tenderness. There is no right CVA tenderness or left CVA tenderness.     Hernia: No hernia is present.  Musculoskeletal:        General: Normal range of motion.     Cervical back: Normal range of motion and neck supple.     Right lower leg: No edema.     Left lower leg: No edema.  Skin:    General: Skin is warm and dry.     Capillary Refill: Capillary refill takes less than 2 seconds.  Neurological:     Mental Status: He is alert.     Deep Tendon Reflexes: Reflexes are normal and symmetric.     Comments: Pleasantly confused (dementia).  Psychiatric:        Attention and Perception: Attention normal.        Mood and Affect: Mood normal.        Speech: Speech normal.        Behavior: Behavior normal. Behavior is cooperative.    Results for orders placed or performed in visit on 06/26/19  UA/M w/rflx Culture, Routine   Specimen: Urine   URINE  Result Value Ref Range   Specific Gravity, UA 1.010 1.005 - 1.030   pH, UA 5.5 5.0 - 7.5   Color, UA Yellow Yellow   Appearance Ur Clear Clear   Leukocytes,UA Negative Negative   Protein,UA Negative Negative/Trace   Glucose, UA Negative Negative   Ketones, UA Negative Negative   RBC, UA Negative Negative   Bilirubin, UA Negative Negative   Urobilinogen, Ur 0.2 0.2 - 1.0 mg/dL   Nitrite, UA Negative Negative  Basic metabolic panel  Result Value Ref Range   Glucose 78 65 - 99 mg/dL   BUN 20 8 - 27 mg/dL   Creatinine, Ser 1.42 (H) 0.76 - 1.27 mg/dL   GFR calc non Af Amer 44 (L) >59 mL/min/1.73   GFR calc Af Amer 51 (L) >59 mL/min/1.73   BUN/Creatinine Ratio 14 10 - 24   Sodium 141 134 - 144 mmol/L   Potassium 4.8 3.5 - 5.2 mmol/L   Chloride 105 96 - 106 mmol/L   CO2 23 20 - 29 mmol/L   Calcium  9.2 8.6 - 10.2 mg/dL      Assessment & Plan:   Problem List Items Addressed This Visit      Cardiovascular and Mediastinum   Hypertension    Chronic, ongoing, followed by cardiology with recent visit.  Continue current medication  regimen at this time, however discussed with his wife holding Losartan if BP <110/60 at home due to concern for orthostatic hypotension and falls in patient dementia and advanced age + poor hydration.  Recommend increase fluid intake at home, getting at least 8 glasses of water a day.  Plan on labs next visit.  Return in 3 months.        Nervous and Auditory   Dementia ([place]) - Primary    Chronic, progressive.  Discussed with his wife progressive nature of disease process, continue Donepezil 10 MG.  Continue to collaborate with neurology.  Will place CCM referral to offer services, may benefit from this.  Monitor closely for falls and consider return of PT in home, although his wife reports he rarely participates with this.  Return in 3 months.      Relevant Orders   Referral to Chronic Care Management Services     Other   Dark urine    UA today unremarkable with exception of 1 + ketones.  Recommend continued increase fluid intake at home, as has poor fluid intake at baseline.  To avoid accidents or frequent trips to bathroom at night, recommend stopping fluid intake a couple hours before bed.  No prostate present per patient and wife report.  CCM referral to offer caregiver support.      Relevant Orders   UA/M w/rflx Culture, Routine       Follow up plan: Return in about 3 months (around 11/29/2019) for Dementia and HTN.

## 2019-08-30 ENCOUNTER — Telehealth: Payer: Self-pay | Admitting: Nurse Practitioner

## 2019-08-30 DIAGNOSIS — M4802 Spinal stenosis, cervical region: Secondary | ICD-10-CM | POA: Diagnosis not present

## 2019-08-30 DIAGNOSIS — M4805 Spinal stenosis, thoracolumbar region: Secondary | ICD-10-CM | POA: Diagnosis not present

## 2019-08-30 DIAGNOSIS — M47812 Spondylosis without myelopathy or radiculopathy, cervical region: Secondary | ICD-10-CM | POA: Diagnosis not present

## 2019-08-30 DIAGNOSIS — G8929 Other chronic pain: Secondary | ICD-10-CM | POA: Diagnosis not present

## 2019-08-30 DIAGNOSIS — I10 Essential (primary) hypertension: Secondary | ICD-10-CM | POA: Diagnosis not present

## 2019-08-30 DIAGNOSIS — M48061 Spinal stenosis, lumbar region without neurogenic claudication: Secondary | ICD-10-CM | POA: Diagnosis not present

## 2019-08-30 DIAGNOSIS — N39498 Other specified urinary incontinence: Secondary | ICD-10-CM | POA: Diagnosis not present

## 2019-08-30 DIAGNOSIS — N401 Enlarged prostate with lower urinary tract symptoms: Secondary | ICD-10-CM | POA: Diagnosis not present

## 2019-08-30 DIAGNOSIS — I2510 Atherosclerotic heart disease of native coronary artery without angina pectoris: Secondary | ICD-10-CM | POA: Diagnosis not present

## 2019-08-30 DIAGNOSIS — R69 Illness, unspecified: Secondary | ICD-10-CM | POA: Diagnosis not present

## 2019-08-30 NOTE — Chronic Care Management (AMB) (Signed)
°  Chronic Care Management   Outreach Note  08/30/2019 Name: Jason Cross. MRN: [id number] DOB: Dec 28, 1928  Jason Cross. is a 86 y.o. year old male who is a primary care patient of Cannady, Barbaraann Faster, NP. I reached out to [place]. by phone today in response to a referral sent by Mr. Jason Ashok Cordia Jr.'s PCP, Venita Lick, NP.     An unsuccessful telephone outreach was attempted today. The patient was referred to the case management team for assistance with care management and care coordination.   Follow Up Plan: A HIPPA compliant phone message was left for the patient providing contact information and requesting a return call. The care management team will reach out to the patient again over the next 7 days. If patient returns call to provider office, please advise to call [place] at [phone].  [place],  [id number] Direct Dial: [phone] [email or web address]: [email or web address]

## 2019-08-31 NOTE — Chronic Care Management (AMB) (Signed)
  Chronic Care Management   Note  08/31/2019 Name: Jason Cross. MRN: [id number] DOB: [birth year]  Ameir W [name]. is a 86 y.o. year old male who is a primary care patient of Cannady, Barbaraann Faster, NP. I reached out to [place]. by phone today in response to a referral sent by Mr. Lysle Ashok Cordia Jr.'s PCP, Venita Lick, NP.     Mr. Hao was given information about Chronic Care Management services today including:  1. CCM service includes personalized support from designated clinical staff supervised by his physician, including individualized plan of care and coordination with other care providers 2. 24/7 contact phone numbers for assistance for urgent and routine care needs. 3. Service will only be billed when office clinical staff spend 20 minutes or more in a month to coordinate care. 4. Only one practitioner may furnish and bill the service in a calendar month. 5. The patient may stop CCM services at any time (effective at the end of the month) by phone call to the office staff. 6. The patient will be responsible for cost sharing (co-pay) of up to 20% of the service fee (after annual deductible is met).  Patient spouse Arther Dames, DPR on file verbally agreed to assistance and services provided by embedded care coordination/care management team today.  Follow up plan: Telephone appointment with care management team member scheduled for: 09/08/2019.  [place], [place] [address] Direct Dial: [phone] [email or web address] Website: [email or web address]

## 2019-09-04 DIAGNOSIS — R69 Illness, unspecified: Secondary | ICD-10-CM | POA: Diagnosis not present

## 2019-09-04 DIAGNOSIS — I10 Essential (primary) hypertension: Secondary | ICD-10-CM | POA: Diagnosis not present

## 2019-09-04 DIAGNOSIS — N39498 Other specified urinary incontinence: Secondary | ICD-10-CM | POA: Diagnosis not present

## 2019-09-04 DIAGNOSIS — M4802 Spinal stenosis, cervical region: Secondary | ICD-10-CM | POA: Diagnosis not present

## 2019-09-04 DIAGNOSIS — M47812 Spondylosis without myelopathy or radiculopathy, cervical region: Secondary | ICD-10-CM | POA: Diagnosis not present

## 2019-09-04 DIAGNOSIS — I2510 Atherosclerotic heart disease of native coronary artery without angina pectoris: Secondary | ICD-10-CM | POA: Diagnosis not present

## 2019-09-04 DIAGNOSIS — N401 Enlarged prostate with lower urinary tract symptoms: Secondary | ICD-10-CM | POA: Diagnosis not present

## 2019-09-04 DIAGNOSIS — M4805 Spinal stenosis, thoracolumbar region: Secondary | ICD-10-CM | POA: Diagnosis not present

## 2019-09-04 DIAGNOSIS — G8929 Other chronic pain: Secondary | ICD-10-CM | POA: Diagnosis not present

## 2019-09-04 DIAGNOSIS — M48061 Spinal stenosis, lumbar region without neurogenic claudication: Secondary | ICD-10-CM | POA: Diagnosis not present

## 2019-09-08 ENCOUNTER — Telehealth: Payer: Medicare HMO

## 2019-10-03 DIAGNOSIS — H353212 Exudative age-related macular degeneration, right eye, with inactive choroidal neovascularization: Secondary | ICD-10-CM | POA: Diagnosis not present

## 2019-10-03 DIAGNOSIS — H353122 Nonexudative age-related macular degeneration, left eye, intermediate dry stage: Secondary | ICD-10-CM | POA: Diagnosis not present

## 2019-10-03 DIAGNOSIS — Z961 Presence of intraocular lens: Secondary | ICD-10-CM | POA: Diagnosis not present

## 2019-10-06 DIAGNOSIS — Z0100 Encounter for examination of eyes and vision without abnormal findings: Secondary | ICD-10-CM | POA: Diagnosis not present

## 2019-10-17 DIAGNOSIS — R413 Other amnesia: Secondary | ICD-10-CM | POA: Diagnosis not present

## 2019-10-17 DIAGNOSIS — R251 Tremor, unspecified: Secondary | ICD-10-CM | POA: Diagnosis not present

## 2019-10-17 DIAGNOSIS — R4701 Aphasia: Secondary | ICD-10-CM | POA: Diagnosis not present

## 2019-10-17 DIAGNOSIS — Z8679 Personal history of other diseases of the circulatory system: Secondary | ICD-10-CM | POA: Diagnosis not present

## 2019-10-17 DIAGNOSIS — G8929 Other chronic pain: Secondary | ICD-10-CM | POA: Diagnosis not present

## 2019-10-17 DIAGNOSIS — R262 Difficulty in walking, not elsewhere classified: Secondary | ICD-10-CM | POA: Diagnosis not present

## 2019-10-17 DIAGNOSIS — M545 Low back pain: Secondary | ICD-10-CM | POA: Diagnosis not present

## 2019-10-20 DIAGNOSIS — I25708 Atherosclerosis of coronary artery bypass graft(s), unspecified, with other forms of angina pectoris: Secondary | ICD-10-CM | POA: Diagnosis not present

## 2019-10-20 DIAGNOSIS — R0602 Shortness of breath: Secondary | ICD-10-CM | POA: Diagnosis not present

## 2019-10-20 DIAGNOSIS — I428 Other cardiomyopathies: Secondary | ICD-10-CM | POA: Diagnosis not present

## 2019-10-20 DIAGNOSIS — E782 Mixed hyperlipidemia: Secondary | ICD-10-CM | POA: Diagnosis not present

## 2019-10-20 DIAGNOSIS — I10 Essential (primary) hypertension: Secondary | ICD-10-CM | POA: Diagnosis not present

## 2019-11-13 ENCOUNTER — Telehealth: Payer: Self-pay | Admitting: Licensed Clinical Social Worker

## 2019-11-13 ENCOUNTER — Telehealth: Payer: Medicare HMO

## 2019-11-13 NOTE — Telephone Encounter (Signed)
  Chronic Care Management    Clinical Social Work General Follow Up Note  11/13/2019 Name: Jason Cross. MRN: [id number] DOB: 1929-01-12  Jason Cross. is a 86 y.o. year old male who is a primary care patient of Cannady, Barbaraann Faster, NP. The CCM team was consulted for assistance with [employer] .   Review of patient status, including review of consultants reports, relevant laboratory and other test results, and collaboration with appropriate care team members and the patient's provider was performed as part of comprehensive patient evaluation and provision of chronic care management services.    LCSW completed CCM outreach attempt today but was unable to reach patient successfully. A HIPPA compliant voice message was left encouraging patient to return call once available. LCSW will ask Scheduling Care Guide to reschedule CCM SW appointment with patient as well.   Outpatient Encounter Medications as of 11/13/2019  Medication Sig  . acetaminophen (TYLENOL) 500 MG tablet Take 500 mg by mouth every 6 (six) hours as needed.  Marland Kitchen aspirin EC 81 MG tablet Take 81 mg by mouth every morning.  . donepezil (ARICEPT) 10 MG tablet Take 1 tablet (10 mg total) by mouth at bedtime.  . gabapentin (NEURONTIN) 300 MG capsule Take 1 capsule by mouth 3 (three) times daily.  Marland Kitchen losartan (COZAAR) 25 MG tablet Take 25 mg by mouth daily.  . Multiple Vitamins-Minerals (PRESERVISION AREDS 2 PO) Take 2 tablets by mouth daily.  Marland Kitchen omeprazole (PRILOSEC) 40 MG capsule Take 40 mg by mouth daily.   No facility-administered encounter medications on file as of 11/13/2019.   Follow Up Plan: Jason Cross will reach out to patient to reschedule appointment.   Eula Fried, BSW, MSW, [facility] Care Management [email or web address] Phone: [phone]

## 2019-11-14 ENCOUNTER — Ambulatory Visit: Payer: Self-pay | Admitting: Licensed Clinical Social Worker

## 2019-11-14 NOTE — Chronic Care Management (AMB) (Signed)
  Care Management   Follow Up Note   11/14/2019 Name: Darnelle Braxten Memmer. MRN: [id number] DOB: [birth year]  Referred by: Venita Lick, NP Reason for referral : Care Coordination   Bassem W Mario Coronado. is a 86 y.o. year old male who is a primary care patient of Cannady, Barbaraann Faster, NP. The care management team was consulted for assistance with care management and care coordination needs.    Review of patient status, including review of consultants reports, relevant laboratory and other test results, and collaboration with appropriate care team members and the patient's provider was performed as part of comprehensive patient evaluation and provision of chronic care management services.    LCSW received incoming return call back from patient's spouse. She wishes to reschedule CCM Social Work appointment on a Wednesday if at all possible. LCSW will route encounter to Scheduling Care Guide.   Eula Fried, BSW, MSW, [facility] [email or web address] Phone: [phone]

## 2019-11-16 ENCOUNTER — Telehealth: Payer: Self-pay

## 2019-11-16 NOTE — Chronic Care Management (AMB) (Signed)
  Care Management   Note  11/16/2019 Name: Silvio Keveon Amsler. MRN: [id number] DOB: 1928-06-07  Gabrielle W [name]. is a 86 y.o. year old male who is a primary care patient of Cannady, Barbaraann Faster, NP and is actively engaged with the care management team. I reached out to [place]. by phone today to assist with re-scheduling a follow up visit with the Licensed Clinical Social Worker  Follow up plan: Unsuccessful telephone outreach attempt made. A HIPAA compliant phone message was left for the patient providing contact information and requesting a return call.  The care management team will reach out to the patient again over the next 7 days.  If patient returns call to provider office, please advise to call [place]  at [place], [place], [place], [place] [phone] Direct Dial: [phone] [email or web address] Website: [email or web address]

## 2019-11-21 NOTE — Telephone Encounter (Signed)
Pt has been r/s for 01/10/2020

## 2019-11-21 NOTE — Chronic Care Management (AMB) (Signed)
  Care Management   Note  11/21/2019 Name: Jason Cross. MRN: [id number] DOB: 08/22/1928  [name]. is a 86 y.o. year old male who is a primary care patient of Cannady, Barbaraann Faster, NP and is actively engaged with the care management team. I reached out to [place]. by phone today to assist with re-scheduling an initial visit with the Licensed Clinical Social Worker  Follow up plan: Telephone appointment with care management team member scheduled for:01/10/2020  Noreene Larsson, [place], [place] Management  [place], [place] [address] Direct Dial: [phone] [email or web address] Website: [email or web address]

## 2019-11-26 ENCOUNTER — Encounter: Payer: Self-pay | Admitting: Nurse Practitioner

## 2019-11-26 DIAGNOSIS — N1831 Chronic kidney disease, stage 3a: Secondary | ICD-10-CM | POA: Insufficient documentation

## 2019-11-26 DIAGNOSIS — N183 Chronic kidney disease, stage 3 unspecified: Secondary | ICD-10-CM | POA: Insufficient documentation

## 2019-11-26 DIAGNOSIS — N1832 Chronic kidney disease, stage 3b: Secondary | ICD-10-CM | POA: Insufficient documentation

## 2019-11-29 ENCOUNTER — Ambulatory Visit: Payer: Medicare HMO | Admitting: Nurse Practitioner

## 2019-11-30 DIAGNOSIS — H539 Unspecified visual disturbance: Secondary | ICD-10-CM | POA: Diagnosis not present

## 2019-11-30 DIAGNOSIS — H3530 Unspecified macular degeneration: Secondary | ICD-10-CM | POA: Diagnosis not present

## 2019-11-30 DIAGNOSIS — Z736 Limitation of activities due to disability: Secondary | ICD-10-CM | POA: Diagnosis not present

## 2019-12-13 ENCOUNTER — Ambulatory Visit: Payer: Medicare HMO | Admitting: Nurse Practitioner

## 2019-12-22 ENCOUNTER — Other Ambulatory Visit: Payer: Self-pay

## 2019-12-22 ENCOUNTER — Encounter: Payer: Self-pay | Admitting: Nurse Practitioner

## 2019-12-22 ENCOUNTER — Ambulatory Visit (INDEPENDENT_AMBULATORY_CARE_PROVIDER_SITE_OTHER): Payer: Medicare HMO | Admitting: Nurse Practitioner

## 2019-12-22 VITALS — BP 136/76 | HR 66 | Temp 97.6°F | Resp 16 | Wt 169.0 lb

## 2019-12-22 DIAGNOSIS — F0390 Unspecified dementia without behavioral disturbance: Secondary | ICD-10-CM | POA: Diagnosis not present

## 2019-12-22 DIAGNOSIS — R69 Illness, unspecified: Secondary | ICD-10-CM | POA: Diagnosis not present

## 2019-12-22 DIAGNOSIS — R262 Difficulty in walking, not elsewhere classified: Secondary | ICD-10-CM | POA: Diagnosis not present

## 2019-12-22 DIAGNOSIS — N1831 Chronic kidney disease, stage 3a: Secondary | ICD-10-CM | POA: Diagnosis not present

## 2019-12-22 DIAGNOSIS — I10 Essential (primary) hypertension: Secondary | ICD-10-CM

## 2019-12-22 NOTE — Assessment & Plan Note (Signed)
Chronic, progressive.  Discussed with his wife progressive nature of disease process, continue Donepezil 10 MG.  Continue to collaborate with neurology.  Monitor closely for falls and Pt referral today for fall prevention and strengthening.  Return in 6 months.

## 2019-12-22 NOTE — Patient Instructions (Signed)
Dementia Caregiver Guide Dementia is a term used to describe a number of symptoms that affect memory and thinking. The most common symptoms include:  Memory loss.  Trouble with language and communication.  Trouble concentrating.  Poor judgment.  Problems with reasoning.  Child-like behavior and language.  Extreme anxiety.  Angry outbursts.  Wandering from home or public places. Dementia usually gets worse slowly over time. In the early stages, people with dementia can stay independent and safe with some help. In later stages, they need help with daily tasks such as dressing, grooming, and using the bathroom. How to help the person with dementia cope Dementia can be frightening and confusing. Here are some tips to help the person with dementia cope with changes caused by the disease. General tips  Keep the person on track with his or her routine.  Try to identify areas where the person may need help.  Be supportive, patient, calm, and encouraging.  Gently remind the person that adjusting to changes takes time.  Help with the tasks that the person has asked for help with.  Keep the person involved in daily tasks and decisions as much as possible.  Encourage conversation, but try not to get frustrated or harried if the person struggles to find words or does not seem to appreciate your help. Communication tips  When the person is talking or seems frustrated, make eye contact and hold the person's hand.  Ask specific questions that need yes or no answers.  Use simple words, short sentences, and a calm voice. Only give one direction at a time.  When offering choices, limit them to just 1 or 2.  Avoid correcting the person in a negative way.  If the person is struggling to find the right words, gently try to help him or her. How to recognize symptoms of stress Symptoms of stress in caregivers include:  Feeling frustrated or angry with the person with  dementia.  Denying that the person has dementia or that his or her symptoms will not improve.  Feeling hopeless and unappreciated.  Difficulty sleeping.  Difficulty concentrating.  Feeling anxious, irritable, or depressed.  Developing stress-related health problems.  Feeling like you have too little time for your own life. Follow these instructions at home:   Make sure that you and the person you are caring for: ? Get regular sleep. ? Exercise regularly. ? Eat regular, nutritious meals. ? Drink enough fluid to keep your urine clear or pale yellow. ? Take over-the-counter and prescription medicines only as told by your health care providers. ? Attend all scheduled health care appointments.  Join a support group with others who are caregivers.  Ask about respite care resources so that you can have a regular break from the stress of caregiving.  Look for signs of stress in yourself and in the person you are caring for. If you notice signs of stress, take steps to manage it.  Consider any safety risks and take steps to avoid them.  Organize medications in a pill box for each day of the week.  Create a plan to handle any legal or financial matters. Get legal or financial advice if needed.  Keep a calendar in a central location to remind the person of appointments or other activities. Tips for reducing the risk of injury  Keep floors clear of clutter. Remove rugs, magazine racks, and floor lamps.  Keep hallways well lit, especially at night.  Put a handrail and nonslip mat in the bathtub or   shower.  Put childproof locks on cabinets that contain dangerous items, such as medicines, alcohol, guns, toxic cleaning items, sharp tools or utensils, matches, and lighters.  Put the locks in places where the person cannot see or reach them easily. This will help ensure that the person does not wander out of the house and get lost.  Be prepared for emergencies. Keep a list of  emergency phone numbers and addresses in a convenient area.  Remove car keys and lock garage doors so that the person does not try to get in the car and drive.  Have the person wear a bracelet that tracks locations and identifies the person as having memory problems. This should be worn at all times for safety. Where to find support: Many individuals and organizations offer support. These include:  Support groups for people with dementia and for caregivers.  Counselors or therapists.  Home health care services.  Adult day care centers. Where to find more information Alzheimer's Association: www.alz.org Contact a health care provider if:  The person's health is rapidly getting worse.  You are no longer able to care for the person.  Caring for the person is affecting your physical and emotional health.  The person threatens himself or herself, you, or anyone else. Summary  Dementia is a term used to describe a number of symptoms that affect memory and thinking.  Dementia usually gets worse slowly over time.  Take steps to reduce the person's risk of injury, and to plan for future care.  Caregivers need support, relief from caregiving, and time for their own lives. This information is not intended to replace advice given to you by your health care provider. Make sure you discuss any questions you have with your health care provider. Document Revised: 01/22/2017 Document Reviewed: 01/14/2016 Elsevier Patient Education  2020 Elsevier Inc.  

## 2019-12-22 NOTE — Assessment & Plan Note (Signed)
Chronic, ongoing, followed by cardiology with recent visit.  Continue current medication regimen at this time, however discussed with his wife holding Losartan if BP <110/60 at home due to concern for orthostatic hypotension and falls in patient dementia and advanced age + poor hydration.  Recommend increase fluid intake at home, getting at least 8 glasses of water a day.  Plan on labs today BMP and TSH.  Return in 6 months.

## 2019-12-22 NOTE — Assessment & Plan Note (Signed)
Noted on recent labs, continue Losartan for kidney protection.  BMP today.

## 2019-12-22 NOTE — Assessment & Plan Note (Signed)
Referral to PT home health placed.

## 2019-12-22 NOTE — Progress Notes (Signed)
BP 136/76   Pulse 66   Temp 97.6 F (36.4 C) (Oral)   Resp 16   Wt 169 lb (76.7 kg)   BMI 24.96 kg/m    Subjective:    Patient ID: Jason Azucena Fallen., male    DOB: 28-Jul-1928, 86 y.o.   MRN: [id number]  HPI: Jason Cross. is a 86 y.o. male  Chief Complaint  Patient presents with  . Dementia    Patient returns back to the clinic today accompanied with his wife for 3 month follow up. Patient has good compliance on Donepizil, wife states that she feels condition of patient is progressively getting worse.  Marland Kitchen Hypertension    Patient returns to office today for 3 month follow up at last visit patients blood pressure in office was 104/66. Patients wife who is accompanied with him today reports good compliance on medication. Patient denies symptoms of chest pain, pressure, or dyspnea.   DEMENTIA: Is followed by neurology, Dr. Melrose Nakayama, last seen 10/17/19 -- to continue current medications.  He is also followed by cardiology for CAD and was last seen 10/20/19 by Dr. Nehemiah Massed, orders for echo and stress test placed (scheduled for 1st week of December).  Continues on Aricept every day.  He eats three meals a day, but this is difficult due to underlying vision issues, dinner being the most difficult.  Weight in January 2020 was 176 and today 169, his wife reports he used to eat double helpings, but does not eat as much as once did.  Not drinking Ensure, drank for a little while.  His wife reports he is more tired and sleeps more then used to.  No recent falls or fractures.  Wife reports his knees give out sometimes, tires easily with this.    Has macular degeneration -- last saw eye doctor 11/30/19 -- is doing visual rehab -- reported there "was not a lot they could do".  HYPERTENSION Hypertension status: stable  Satisfied with current treatment? yes Duration of hypertension: chronic BP monitoring frequency:  not checking BP range:  BP medication side effects:  no Medication  compliance: good compliance Previous BP meds: unknown Aspirin: yes Recurrent headaches: no Visual changes: no Palpitations: no Dyspnea: no Chest pain: no Lower extremity edema: no Dizzy/lightheaded: no  Relevant past medical, surgical, family and social history reviewed and updated as indicated. Interim medical history since our last visit reviewed. Allergies and medications reviewed and updated.  Review of Systems  Constitutional: Negative for activity change, diaphoresis, fatigue and fever.  Respiratory: Negative for cough, chest tightness, shortness of breath and wheezing.   Cardiovascular: Negative for chest pain, palpitations and leg swelling.  Gastrointestinal: Negative.   Neurological: Negative.   Psychiatric/Behavioral: Negative.     Per HPI unless specifically indicated above     Objective:    BP 136/76   Pulse 66   Temp 97.6 F (36.4 C) (Oral)   Resp 16   Wt 169 lb (76.7 kg)   BMI 24.96 kg/m   Wt Readings from Last 3 Encounters:  12/22/19 169 lb (76.7 kg)  08/29/19 162 lb (73.5 kg)  07/17/19 165 lb 12.8 oz (75.2 kg)    Physical Exam Vitals and nursing note reviewed.  Constitutional:      General: He is awake. He is not in acute distress.    Appearance: He is well-developed and well-groomed. He is not ill-appearing.  HENT:     Head: Normocephalic and atraumatic.     Right Ear:  Hearing normal. No drainage.     Left Ear: Hearing normal. No drainage.  Eyes:     General: Lids are normal.        Right eye: No discharge.        Left eye: No discharge.     Conjunctiva/sclera: Conjunctivae normal.     Pupils: Pupils are equal, round, and reactive to light.  Neck:     Thyroid: No thyromegaly.     Vascular: No carotid bruit.  Cardiovascular:     Rate and Rhythm: Normal rate and regular rhythm.     Heart sounds: Normal heart sounds, S1 normal and S2 normal. No murmur heard.  No gallop.   Pulmonary:     Effort: Pulmonary effort is normal. No accessory  muscle usage or respiratory distress.     Breath sounds: Normal breath sounds.  Abdominal:     General: Bowel sounds are normal. There is no distension.     Palpations: Abdomen is soft. There is no hepatomegaly or splenomegaly.     Tenderness: There is no abdominal tenderness. There is no right CVA tenderness or left CVA tenderness.     Hernia: No hernia is present.  Musculoskeletal:        General: Normal range of motion.     Cervical back: Normal range of motion and neck supple.     Right lower leg: No edema.     Left lower leg: No edema.  Skin:    General: Skin is warm and dry.     Capillary Refill: Capillary refill takes less than 2 seconds.     Comments: Scattered pale purple bruises to bilateral upper extremities.  Neurological:     Mental Status: He is alert.     Deep Tendon Reflexes: Reflexes are normal and symmetric.     Comments: Pleasantly confused (dementia).  He is oriented to month and year + location, not day of week.  Psychiatric:        Attention and Perception: Attention normal.        Mood and Affect: Mood normal.        Speech: Speech normal.        Behavior: Behavior normal. Behavior is cooperative.    Results for orders placed or performed in visit on 08/29/19  UA/M w/rflx Culture, Routine   Specimen: Urine   Urine  Result Value Ref Range   Specific Gravity, UA 1.020 1.005 - 1.030   pH, UA 5.5 5.0 - 7.5   Color, UA Yellow Yellow   Appearance Ur Clear Clear   Leukocytes,UA Negative Negative   Protein,UA Negative Negative/Trace   Glucose, UA Negative Negative   Ketones, UA Trace (A) Negative   RBC, UA Negative Negative   Bilirubin, UA Negative Negative   Urobilinogen, Ur 1.0 0.2 - 1.0 mg/dL   Nitrite, UA Negative Negative      Assessment & Plan:   Problem List Items Addressed This Visit      Cardiovascular and Mediastinum   Hypertension    Chronic, ongoing, followed by cardiology with recent visit.  Continue current medication regimen at this  time, however discussed with his wife holding Losartan if BP <110/60 at home due to concern for orthostatic hypotension and falls in patient dementia and advanced age + poor hydration.  Recommend increase fluid intake at home, getting at least 8 glasses of water a day.  Plan on labs today BMP and TSH.  Return in 6 months.      Relevant  Orders   Basic metabolic panel   TSH     Nervous and Auditory   Dementia ([place]) - Primary    Chronic, progressive.  Discussed with his wife progressive nature of disease process, continue Donepezil 10 MG.  Continue to collaborate with neurology.  Monitor closely for falls and Pt referral today for fall prevention and strengthening.  Return in 6 months.      Relevant Orders   Ambulatory referral to Home Health     Genitourinary   CKD (chronic kidney disease) stage 3, GFR 30-59 ml/min (HCC)    Noted on recent labs, continue Losartan for kidney protection.  BMP today.        Other   Difficulty walking    Referral to PT home health placed.          Follow up plan: Return in about 6 months (around 06/21/2020) for Dementia and HTN.

## 2019-12-23 LAB — BASIC METABOLIC PANEL
BUN/Creatinine Ratio: 18 (ref 10–24)
BUN: 25 mg/dL (ref 8–27)
CO2: 22 mmol/L (ref 20–29)
Calcium: 8.9 mg/dL (ref 8.6–10.2)
Chloride: 105 mmol/L (ref 96–106)
Creatinine, Ser: 1.39 mg/dL — ABNORMAL HIGH (ref 0.76–1.27)
GFR calc Af Amer: 52 mL/min/{1.73_m2} — ABNORMAL LOW (ref >59)
GFR calc non Af Amer: 45 mL/min/{1.73_m2} — ABNORMAL LOW (ref >59)
Glucose: 98 mg/dL (ref 65–99)
Potassium: 5 mmol/L (ref 3.5–5.2)
Sodium: 139 mmol/L (ref 134–144)

## 2019-12-23 LAB — TSH: TSH: 0.485 u[IU]/mL (ref 0.450–4.500)

## 2019-12-23 NOTE — Progress Notes (Signed)
Please let Mr. Ortwein and his wife know labs have returned.  Kidney function continues to show some kidney disease being present, but no decline in this.  We will continue to monitor at visits, no changes needed at this time.  Thyroid lab is normal.  If any questions let me know. Keep being awesome!!  Thank you for allowing me to participate in your care. Kindest regards, Konner Warrior

## 2019-12-28 ENCOUNTER — Telehealth: Payer: Self-pay | Admitting: Nurse Practitioner

## 2019-12-28 NOTE — Telephone Encounter (Signed)
Please call and alert them 3 weeks is fine.:)

## 2019-12-28 NOTE — Telephone Encounter (Signed)
Called and LVM letting Dewana know that [name] said it was ok to wait the 3 weeks.

## 2019-12-28 NOTE — Telephone Encounter (Signed)
Dewana, from [facility], called stating that the pts wife has requesting to have the pts PT start in 3 weeks due to going out of town. Please advise.      [phone]  Ext [phone]

## 2019-12-28 NOTE — Telephone Encounter (Signed)
Routing to provider  

## 2020-01-10 ENCOUNTER — Telehealth: Payer: Self-pay | Admitting: *Deleted

## 2020-01-10 ENCOUNTER — Telehealth: Payer: Medicare HMO

## 2020-01-10 NOTE — Chronic Care Management (AMB) (Signed)
  Care Management   Note  01/10/2020 Name: Jason Cross. MRN: [id number] DOB: Aug 06, 1928  [name]. is a 86 y.o. year old male who is a primary care patient of Cannady, Barbaraann Faster, NP and is actively engaged with the care management team. I reached out to [place]. by phone today to assist with re-scheduling an initial visit with the Licensed Clinical Education officer, museum.  Follow up plan: Unsuccessful telephone outreach attempt made. A HIPAA compliant phone message was left for the patient providing contact information and requesting a return call. The care management team will reach out to the patient again over the next 7-14 days. If patient returns call to provider office, please advise to call [place] at [phone].  [place] Management

## 2020-01-16 DIAGNOSIS — I129 Hypertensive chronic kidney disease with stage 1 through stage 4 chronic kidney disease, or unspecified chronic kidney disease: Secondary | ICD-10-CM | POA: Diagnosis not present

## 2020-01-16 DIAGNOSIS — K219 Gastro-esophageal reflux disease without esophagitis: Secondary | ICD-10-CM | POA: Diagnosis not present

## 2020-01-16 DIAGNOSIS — H3530 Unspecified macular degeneration: Secondary | ICD-10-CM | POA: Diagnosis not present

## 2020-01-16 DIAGNOSIS — G629 Polyneuropathy, unspecified: Secondary | ICD-10-CM | POA: Diagnosis not present

## 2020-01-16 DIAGNOSIS — I951 Orthostatic hypotension: Secondary | ICD-10-CM | POA: Diagnosis not present

## 2020-01-16 DIAGNOSIS — R69 Illness, unspecified: Secondary | ICD-10-CM | POA: Diagnosis not present

## 2020-01-16 DIAGNOSIS — M4186 Other forms of scoliosis, lumbar region: Secondary | ICD-10-CM | POA: Diagnosis not present

## 2020-01-16 DIAGNOSIS — N1831 Chronic kidney disease, stage 3a: Secondary | ICD-10-CM | POA: Diagnosis not present

## 2020-01-16 DIAGNOSIS — I2510 Atherosclerotic heart disease of native coronary artery without angina pectoris: Secondary | ICD-10-CM | POA: Diagnosis not present

## 2020-01-16 DIAGNOSIS — K5901 Slow transit constipation: Secondary | ICD-10-CM | POA: Diagnosis not present

## 2020-01-22 DIAGNOSIS — G629 Polyneuropathy, unspecified: Secondary | ICD-10-CM | POA: Diagnosis not present

## 2020-01-22 DIAGNOSIS — N1831 Chronic kidney disease, stage 3a: Secondary | ICD-10-CM | POA: Diagnosis not present

## 2020-01-22 DIAGNOSIS — K5901 Slow transit constipation: Secondary | ICD-10-CM | POA: Diagnosis not present

## 2020-01-22 DIAGNOSIS — H3530 Unspecified macular degeneration: Secondary | ICD-10-CM | POA: Diagnosis not present

## 2020-01-22 DIAGNOSIS — M4186 Other forms of scoliosis, lumbar region: Secondary | ICD-10-CM | POA: Diagnosis not present

## 2020-01-22 DIAGNOSIS — I129 Hypertensive chronic kidney disease with stage 1 through stage 4 chronic kidney disease, or unspecified chronic kidney disease: Secondary | ICD-10-CM | POA: Diagnosis not present

## 2020-01-22 DIAGNOSIS — I2510 Atherosclerotic heart disease of native coronary artery without angina pectoris: Secondary | ICD-10-CM | POA: Diagnosis not present

## 2020-01-22 DIAGNOSIS — K219 Gastro-esophageal reflux disease without esophagitis: Secondary | ICD-10-CM | POA: Diagnosis not present

## 2020-01-22 DIAGNOSIS — I951 Orthostatic hypotension: Secondary | ICD-10-CM | POA: Diagnosis not present

## 2020-01-22 DIAGNOSIS — R69 Illness, unspecified: Secondary | ICD-10-CM | POA: Diagnosis not present

## 2020-01-23 NOTE — Chronic Care Management (AMB) (Signed)
  Care Management   Note  01/23/2020 Name: Jason Cross. MRN: [id number] DOB: December 27, 1928  [name]. is a 86 y.o. year old male who is a primary care patient of Cannady, Barbaraann Faster, NP and is actively engaged with the care management team. I reached out to [place]. by phone today to assist with re-scheduling an initial visit with the Licensed Clinical Education officer, museum.  Follow up plan: Telephone appointment with care management team member scheduled for:02/07/2020  [place] Management  Direct Dial: [phone]

## 2020-01-26 DIAGNOSIS — I129 Hypertensive chronic kidney disease with stage 1 through stage 4 chronic kidney disease, or unspecified chronic kidney disease: Secondary | ICD-10-CM | POA: Diagnosis not present

## 2020-01-26 DIAGNOSIS — H3530 Unspecified macular degeneration: Secondary | ICD-10-CM | POA: Diagnosis not present

## 2020-01-26 DIAGNOSIS — G629 Polyneuropathy, unspecified: Secondary | ICD-10-CM | POA: Diagnosis not present

## 2020-01-26 DIAGNOSIS — N1831 Chronic kidney disease, stage 3a: Secondary | ICD-10-CM | POA: Diagnosis not present

## 2020-01-26 DIAGNOSIS — R69 Illness, unspecified: Secondary | ICD-10-CM | POA: Diagnosis not present

## 2020-01-26 DIAGNOSIS — I951 Orthostatic hypotension: Secondary | ICD-10-CM | POA: Diagnosis not present

## 2020-01-26 DIAGNOSIS — M4186 Other forms of scoliosis, lumbar region: Secondary | ICD-10-CM | POA: Diagnosis not present

## 2020-01-26 DIAGNOSIS — K219 Gastro-esophageal reflux disease without esophagitis: Secondary | ICD-10-CM | POA: Diagnosis not present

## 2020-01-26 DIAGNOSIS — K5901 Slow transit constipation: Secondary | ICD-10-CM | POA: Diagnosis not present

## 2020-01-26 DIAGNOSIS — I2510 Atherosclerotic heart disease of native coronary artery without angina pectoris: Secondary | ICD-10-CM | POA: Diagnosis not present

## 2020-01-29 DIAGNOSIS — K5901 Slow transit constipation: Secondary | ICD-10-CM | POA: Diagnosis not present

## 2020-01-29 DIAGNOSIS — G629 Polyneuropathy, unspecified: Secondary | ICD-10-CM | POA: Diagnosis not present

## 2020-01-29 DIAGNOSIS — I129 Hypertensive chronic kidney disease with stage 1 through stage 4 chronic kidney disease, or unspecified chronic kidney disease: Secondary | ICD-10-CM | POA: Diagnosis not present

## 2020-01-29 DIAGNOSIS — I2510 Atherosclerotic heart disease of native coronary artery without angina pectoris: Secondary | ICD-10-CM | POA: Diagnosis not present

## 2020-01-29 DIAGNOSIS — R69 Illness, unspecified: Secondary | ICD-10-CM | POA: Diagnosis not present

## 2020-01-29 DIAGNOSIS — N1831 Chronic kidney disease, stage 3a: Secondary | ICD-10-CM | POA: Diagnosis not present

## 2020-01-29 DIAGNOSIS — I951 Orthostatic hypotension: Secondary | ICD-10-CM | POA: Diagnosis not present

## 2020-01-29 DIAGNOSIS — K219 Gastro-esophageal reflux disease without esophagitis: Secondary | ICD-10-CM | POA: Diagnosis not present

## 2020-01-29 DIAGNOSIS — H3530 Unspecified macular degeneration: Secondary | ICD-10-CM | POA: Diagnosis not present

## 2020-01-29 DIAGNOSIS — M4186 Other forms of scoliosis, lumbar region: Secondary | ICD-10-CM | POA: Diagnosis not present

## 2020-01-30 DIAGNOSIS — R0602 Shortness of breath: Secondary | ICD-10-CM | POA: Diagnosis not present

## 2020-01-30 DIAGNOSIS — I25708 Atherosclerosis of coronary artery bypass graft(s), unspecified, with other forms of angina pectoris: Secondary | ICD-10-CM | POA: Diagnosis not present

## 2020-02-02 DIAGNOSIS — G629 Polyneuropathy, unspecified: Secondary | ICD-10-CM | POA: Diagnosis not present

## 2020-02-02 DIAGNOSIS — R69 Illness, unspecified: Secondary | ICD-10-CM | POA: Diagnosis not present

## 2020-02-02 DIAGNOSIS — N1831 Chronic kidney disease, stage 3a: Secondary | ICD-10-CM | POA: Diagnosis not present

## 2020-02-02 DIAGNOSIS — I2510 Atherosclerotic heart disease of native coronary artery without angina pectoris: Secondary | ICD-10-CM | POA: Diagnosis not present

## 2020-02-02 DIAGNOSIS — K5901 Slow transit constipation: Secondary | ICD-10-CM | POA: Diagnosis not present

## 2020-02-02 DIAGNOSIS — K219 Gastro-esophageal reflux disease without esophagitis: Secondary | ICD-10-CM | POA: Diagnosis not present

## 2020-02-02 DIAGNOSIS — I129 Hypertensive chronic kidney disease with stage 1 through stage 4 chronic kidney disease, or unspecified chronic kidney disease: Secondary | ICD-10-CM | POA: Diagnosis not present

## 2020-02-02 DIAGNOSIS — I951 Orthostatic hypotension: Secondary | ICD-10-CM | POA: Diagnosis not present

## 2020-02-02 DIAGNOSIS — M4186 Other forms of scoliosis, lumbar region: Secondary | ICD-10-CM | POA: Diagnosis not present

## 2020-02-02 DIAGNOSIS — H3530 Unspecified macular degeneration: Secondary | ICD-10-CM | POA: Diagnosis not present

## 2020-02-05 DIAGNOSIS — E782 Mixed hyperlipidemia: Secondary | ICD-10-CM | POA: Diagnosis not present

## 2020-02-05 DIAGNOSIS — I25810 Atherosclerosis of coronary artery bypass graft(s) without angina pectoris: Secondary | ICD-10-CM | POA: Diagnosis not present

## 2020-02-05 DIAGNOSIS — I10 Essential (primary) hypertension: Secondary | ICD-10-CM | POA: Diagnosis not present

## 2020-02-07 ENCOUNTER — Telehealth: Payer: Self-pay | Admitting: Licensed Clinical Social Worker

## 2020-02-07 ENCOUNTER — Telehealth: Payer: Medicare HMO

## 2020-02-07 NOTE — Telephone Encounter (Signed)
  Chronic Care Management    Clinical Social Work General Follow Up Note  02/07/2020 Name: Jason Cross. MRN: [id number] DOB: Aug 17, 1928  Jason Cross. is a 86 y.o. year old male who is a primary care patient of Cannady, Barbaraann Faster, NP. The CCM team was consulted for assistance with [employer] .   Review of patient status, including review of consultants reports, relevant laboratory and other test results, and collaboration with appropriate care team members and the patient's provider was performed as part of comprehensive patient evaluation and provision of chronic care management services.    LCSW completed CCM outreach attempt today but was unable to reach patient successfully. A HIPPA compliant voice message was left encouraging patient to return call once available. LCSW will ask Scheduling Care Guide to reschedule CCM SW appointment with patient as well.  Outpatient Encounter Medications as of 02/07/2020  Medication Sig  . acetaminophen (TYLENOL) 500 MG tablet Take 500 mg by mouth every 6 (six) hours as needed.  Marland Kitchen aspirin EC 81 MG tablet Take 81 mg by mouth every morning.  . donepezil (ARICEPT) 10 MG tablet Take 1 tablet (10 mg total) by mouth at bedtime.  . gabapentin (NEURONTIN) 300 MG capsule Take 1 capsule by mouth 3 (three) times daily.  Marland Kitchen losartan (COZAAR) 25 MG tablet Take 25 mg by mouth daily.  . Multiple Vitamins-Minerals (PRESERVISION AREDS 2 PO) Take 2 tablets by mouth daily.  Marland Kitchen omeprazole (PRILOSEC) 40 MG capsule Take 40 mg by mouth daily.   No facility-administered encounter medications on file as of 02/07/2020.    Follow Up Plan: [place] will reach out to patient to reschedule appointment.   Eula Fried, BSW, MSW, [facility] Care Management [email or web address] Phone: [phone]

## 2020-02-08 DIAGNOSIS — R251 Tremor, unspecified: Secondary | ICD-10-CM | POA: Diagnosis not present

## 2020-02-08 DIAGNOSIS — H3530 Unspecified macular degeneration: Secondary | ICD-10-CM | POA: Diagnosis not present

## 2020-02-08 DIAGNOSIS — R2689 Other abnormalities of gait and mobility: Secondary | ICD-10-CM | POA: Diagnosis not present

## 2020-02-08 DIAGNOSIS — I129 Hypertensive chronic kidney disease with stage 1 through stage 4 chronic kidney disease, or unspecified chronic kidney disease: Secondary | ICD-10-CM | POA: Diagnosis not present

## 2020-02-08 DIAGNOSIS — R4701 Aphasia: Secondary | ICD-10-CM | POA: Diagnosis not present

## 2020-02-08 DIAGNOSIS — G8929 Other chronic pain: Secondary | ICD-10-CM | POA: Diagnosis not present

## 2020-02-08 DIAGNOSIS — R42 Dizziness and giddiness: Secondary | ICD-10-CM | POA: Diagnosis not present

## 2020-02-08 DIAGNOSIS — R413 Other amnesia: Secondary | ICD-10-CM | POA: Diagnosis not present

## 2020-02-08 DIAGNOSIS — K5901 Slow transit constipation: Secondary | ICD-10-CM | POA: Diagnosis not present

## 2020-02-08 DIAGNOSIS — M545 Low back pain, unspecified: Secondary | ICD-10-CM | POA: Diagnosis not present

## 2020-02-08 DIAGNOSIS — K219 Gastro-esophageal reflux disease without esophagitis: Secondary | ICD-10-CM | POA: Diagnosis not present

## 2020-02-08 DIAGNOSIS — Z8679 Personal history of other diseases of the circulatory system: Secondary | ICD-10-CM | POA: Diagnosis not present

## 2020-02-08 DIAGNOSIS — I2510 Atherosclerotic heart disease of native coronary artery without angina pectoris: Secondary | ICD-10-CM | POA: Diagnosis not present

## 2020-02-08 DIAGNOSIS — R69 Illness, unspecified: Secondary | ICD-10-CM | POA: Diagnosis not present

## 2020-02-08 DIAGNOSIS — I951 Orthostatic hypotension: Secondary | ICD-10-CM | POA: Diagnosis not present

## 2020-02-08 DIAGNOSIS — G629 Polyneuropathy, unspecified: Secondary | ICD-10-CM | POA: Diagnosis not present

## 2020-02-08 DIAGNOSIS — N1831 Chronic kidney disease, stage 3a: Secondary | ICD-10-CM | POA: Diagnosis not present

## 2020-02-08 DIAGNOSIS — M4186 Other forms of scoliosis, lumbar region: Secondary | ICD-10-CM | POA: Diagnosis not present

## 2020-02-08 DIAGNOSIS — R262 Difficulty in walking, not elsewhere classified: Secondary | ICD-10-CM | POA: Diagnosis not present

## 2020-02-09 DIAGNOSIS — G629 Polyneuropathy, unspecified: Secondary | ICD-10-CM | POA: Diagnosis not present

## 2020-02-09 DIAGNOSIS — K219 Gastro-esophageal reflux disease without esophagitis: Secondary | ICD-10-CM | POA: Diagnosis not present

## 2020-02-09 DIAGNOSIS — R69 Illness, unspecified: Secondary | ICD-10-CM | POA: Diagnosis not present

## 2020-02-09 DIAGNOSIS — I129 Hypertensive chronic kidney disease with stage 1 through stage 4 chronic kidney disease, or unspecified chronic kidney disease: Secondary | ICD-10-CM | POA: Diagnosis not present

## 2020-02-09 DIAGNOSIS — I951 Orthostatic hypotension: Secondary | ICD-10-CM | POA: Diagnosis not present

## 2020-02-09 DIAGNOSIS — M4186 Other forms of scoliosis, lumbar region: Secondary | ICD-10-CM | POA: Diagnosis not present

## 2020-02-09 DIAGNOSIS — K5901 Slow transit constipation: Secondary | ICD-10-CM | POA: Diagnosis not present

## 2020-02-09 DIAGNOSIS — I2510 Atherosclerotic heart disease of native coronary artery without angina pectoris: Secondary | ICD-10-CM | POA: Diagnosis not present

## 2020-02-09 DIAGNOSIS — N1831 Chronic kidney disease, stage 3a: Secondary | ICD-10-CM | POA: Diagnosis not present

## 2020-02-09 DIAGNOSIS — H3530 Unspecified macular degeneration: Secondary | ICD-10-CM | POA: Diagnosis not present

## 2020-02-12 ENCOUNTER — Telehealth: Payer: Self-pay

## 2020-02-12 NOTE — Chronic Care Management (AMB) (Signed)
  Care Management   Note  02/12/2020 Name: Jason Cross. MRN: [id number] DOB: [birth year]  [name]. is a 86 y.o. year old male who is a primary care patient of Cannady, Barbaraann Faster, NP and is actively engaged with the care management team. I reached out to [place]. by phone today to assist with re-scheduling an initial visit with the Licensed Clinical Social Worker  Follow up plan: Unsuccessful telephone outreach attempt made. A HIPAA compliant phone message was left for the patient providing contact information and requesting a return call.  The care management team will reach out to the patient again over the next 7 days.  If patient returns call to provider office, please advise to call [facility]  at [place], [place], [place], [place] [address] Direct Dial: [phone] [email or web address] Website: [email or web address]

## 2020-02-15 DIAGNOSIS — H3530 Unspecified macular degeneration: Secondary | ICD-10-CM | POA: Diagnosis not present

## 2020-02-15 DIAGNOSIS — I129 Hypertensive chronic kidney disease with stage 1 through stage 4 chronic kidney disease, or unspecified chronic kidney disease: Secondary | ICD-10-CM | POA: Diagnosis not present

## 2020-02-15 DIAGNOSIS — K5901 Slow transit constipation: Secondary | ICD-10-CM | POA: Diagnosis not present

## 2020-02-15 DIAGNOSIS — R69 Illness, unspecified: Secondary | ICD-10-CM | POA: Diagnosis not present

## 2020-02-15 DIAGNOSIS — K219 Gastro-esophageal reflux disease without esophagitis: Secondary | ICD-10-CM | POA: Diagnosis not present

## 2020-02-15 DIAGNOSIS — M4186 Other forms of scoliosis, lumbar region: Secondary | ICD-10-CM | POA: Diagnosis not present

## 2020-02-15 DIAGNOSIS — G629 Polyneuropathy, unspecified: Secondary | ICD-10-CM | POA: Diagnosis not present

## 2020-02-15 DIAGNOSIS — I2510 Atherosclerotic heart disease of native coronary artery without angina pectoris: Secondary | ICD-10-CM | POA: Diagnosis not present

## 2020-02-15 DIAGNOSIS — I951 Orthostatic hypotension: Secondary | ICD-10-CM | POA: Diagnosis not present

## 2020-02-15 DIAGNOSIS — N1831 Chronic kidney disease, stage 3a: Secondary | ICD-10-CM | POA: Diagnosis not present

## 2020-02-20 DIAGNOSIS — I129 Hypertensive chronic kidney disease with stage 1 through stage 4 chronic kidney disease, or unspecified chronic kidney disease: Secondary | ICD-10-CM | POA: Diagnosis not present

## 2020-02-20 DIAGNOSIS — M4186 Other forms of scoliosis, lumbar region: Secondary | ICD-10-CM | POA: Diagnosis not present

## 2020-02-20 DIAGNOSIS — K5901 Slow transit constipation: Secondary | ICD-10-CM | POA: Diagnosis not present

## 2020-02-20 DIAGNOSIS — R69 Illness, unspecified: Secondary | ICD-10-CM | POA: Diagnosis not present

## 2020-02-20 DIAGNOSIS — K219 Gastro-esophageal reflux disease without esophagitis: Secondary | ICD-10-CM | POA: Diagnosis not present

## 2020-02-20 DIAGNOSIS — I951 Orthostatic hypotension: Secondary | ICD-10-CM | POA: Diagnosis not present

## 2020-02-20 DIAGNOSIS — H3530 Unspecified macular degeneration: Secondary | ICD-10-CM | POA: Diagnosis not present

## 2020-02-20 DIAGNOSIS — N1831 Chronic kidney disease, stage 3a: Secondary | ICD-10-CM | POA: Diagnosis not present

## 2020-02-20 DIAGNOSIS — G629 Polyneuropathy, unspecified: Secondary | ICD-10-CM | POA: Diagnosis not present

## 2020-02-20 DIAGNOSIS — I2510 Atherosclerotic heart disease of native coronary artery without angina pectoris: Secondary | ICD-10-CM | POA: Diagnosis not present

## 2020-02-21 NOTE — Chronic Care Management (AMB) (Signed)
  Care Management   Note  02/21/2020 Name: Kwaku Yoskar Murrillo. MRN: [id number] DOB: 12-29-1928  Cono W Vanita Panda. is a 86 y.o. year old male who is a primary care patient of Cannady, Dorie Rank, NP and is actively engaged with the care management team. I reached out to [employer]. by phone today to assist with re-scheduling an initial visit with the Licensed Clinical Social Worker  Follow up plan: Unsuccessful telephone outreach attempt made. A HIPAA compliant phone message was left for the patient providing contact information and requesting a return call.  The care management team will reach out to the patient again over the next 7 days.  If patient returns call to provider office, please advise to call Embedded Care Management Care Guide Penne Lash  at [phone]  Penne Lash, RMA Care Guide, Embedded Care Coordination [facility]  [place], [place] [address] Direct Dial: [phone] [email or web address] Website: [email or web address]

## 2020-02-28 NOTE — Chronic Care Management (AMB) (Signed)
  Care Management   Note  02/28/2020 Name: Jason Cross. MRN: [id number] DOB: Feb 18, 1929  Jason Cross. is a 87 y.o. year old male who is a primary care patient of Cannady, Dorie Rank, NP and is actively engaged with the care management team. I reached out to [name] by phone today to assist with re-scheduling an initial visit with the Licensed Clinical Social Worker  Follow up plan: Unable to make contact on outreach attempts x 2. PCP Aura Dials, NP  notified via routed documentation in medical record.   Penne Lash, RMA Care Guide, Embedded Care Coordination [facility]  [place], [place] [address] Direct Dial: [phone] [email or web address] Website: [email or web address]

## 2020-02-28 NOTE — Telephone Encounter (Signed)
Third unsuccessful outreach  

## 2020-02-29 DIAGNOSIS — K219 Gastro-esophageal reflux disease without esophagitis: Secondary | ICD-10-CM | POA: Diagnosis not present

## 2020-02-29 DIAGNOSIS — I2510 Atherosclerotic heart disease of native coronary artery without angina pectoris: Secondary | ICD-10-CM | POA: Diagnosis not present

## 2020-02-29 DIAGNOSIS — N1831 Chronic kidney disease, stage 3a: Secondary | ICD-10-CM | POA: Diagnosis not present

## 2020-02-29 DIAGNOSIS — M4186 Other forms of scoliosis, lumbar region: Secondary | ICD-10-CM | POA: Diagnosis not present

## 2020-02-29 DIAGNOSIS — I951 Orthostatic hypotension: Secondary | ICD-10-CM | POA: Diagnosis not present

## 2020-02-29 DIAGNOSIS — H3530 Unspecified macular degeneration: Secondary | ICD-10-CM | POA: Diagnosis not present

## 2020-02-29 DIAGNOSIS — G629 Polyneuropathy, unspecified: Secondary | ICD-10-CM | POA: Diagnosis not present

## 2020-02-29 DIAGNOSIS — K5901 Slow transit constipation: Secondary | ICD-10-CM | POA: Diagnosis not present

## 2020-02-29 DIAGNOSIS — R69 Illness, unspecified: Secondary | ICD-10-CM | POA: Diagnosis not present

## 2020-02-29 DIAGNOSIS — I129 Hypertensive chronic kidney disease with stage 1 through stage 4 chronic kidney disease, or unspecified chronic kidney disease: Secondary | ICD-10-CM | POA: Diagnosis not present

## 2020-03-06 DIAGNOSIS — K219 Gastro-esophageal reflux disease without esophagitis: Secondary | ICD-10-CM | POA: Diagnosis not present

## 2020-03-06 DIAGNOSIS — G629 Polyneuropathy, unspecified: Secondary | ICD-10-CM | POA: Diagnosis not present

## 2020-03-06 DIAGNOSIS — N1831 Chronic kidney disease, stage 3a: Secondary | ICD-10-CM | POA: Diagnosis not present

## 2020-03-06 DIAGNOSIS — I129 Hypertensive chronic kidney disease with stage 1 through stage 4 chronic kidney disease, or unspecified chronic kidney disease: Secondary | ICD-10-CM | POA: Diagnosis not present

## 2020-03-06 DIAGNOSIS — I2510 Atherosclerotic heart disease of native coronary artery without angina pectoris: Secondary | ICD-10-CM | POA: Diagnosis not present

## 2020-03-06 DIAGNOSIS — I951 Orthostatic hypotension: Secondary | ICD-10-CM | POA: Diagnosis not present

## 2020-03-06 DIAGNOSIS — K5901 Slow transit constipation: Secondary | ICD-10-CM | POA: Diagnosis not present

## 2020-03-06 DIAGNOSIS — M4186 Other forms of scoliosis, lumbar region: Secondary | ICD-10-CM | POA: Diagnosis not present

## 2020-03-06 DIAGNOSIS — H3530 Unspecified macular degeneration: Secondary | ICD-10-CM | POA: Diagnosis not present

## 2020-03-06 DIAGNOSIS — R69 Illness, unspecified: Secondary | ICD-10-CM | POA: Diagnosis not present

## 2020-03-08 DIAGNOSIS — N1831 Chronic kidney disease, stage 3a: Secondary | ICD-10-CM | POA: Diagnosis not present

## 2020-03-08 DIAGNOSIS — I129 Hypertensive chronic kidney disease with stage 1 through stage 4 chronic kidney disease, or unspecified chronic kidney disease: Secondary | ICD-10-CM | POA: Diagnosis not present

## 2020-03-08 DIAGNOSIS — K5901 Slow transit constipation: Secondary | ICD-10-CM | POA: Diagnosis not present

## 2020-03-08 DIAGNOSIS — G629 Polyneuropathy, unspecified: Secondary | ICD-10-CM | POA: Diagnosis not present

## 2020-03-08 DIAGNOSIS — M4186 Other forms of scoliosis, lumbar region: Secondary | ICD-10-CM | POA: Diagnosis not present

## 2020-03-08 DIAGNOSIS — H3530 Unspecified macular degeneration: Secondary | ICD-10-CM | POA: Diagnosis not present

## 2020-03-08 DIAGNOSIS — I2510 Atherosclerotic heart disease of native coronary artery without angina pectoris: Secondary | ICD-10-CM | POA: Diagnosis not present

## 2020-03-08 DIAGNOSIS — I951 Orthostatic hypotension: Secondary | ICD-10-CM | POA: Diagnosis not present

## 2020-03-08 DIAGNOSIS — K219 Gastro-esophageal reflux disease without esophagitis: Secondary | ICD-10-CM | POA: Diagnosis not present

## 2020-03-08 DIAGNOSIS — R69 Illness, unspecified: Secondary | ICD-10-CM | POA: Diagnosis not present

## 2020-03-12 DIAGNOSIS — M4186 Other forms of scoliosis, lumbar region: Secondary | ICD-10-CM | POA: Diagnosis not present

## 2020-03-12 DIAGNOSIS — I951 Orthostatic hypotension: Secondary | ICD-10-CM | POA: Diagnosis not present

## 2020-03-12 DIAGNOSIS — I2510 Atherosclerotic heart disease of native coronary artery without angina pectoris: Secondary | ICD-10-CM | POA: Diagnosis not present

## 2020-03-12 DIAGNOSIS — I129 Hypertensive chronic kidney disease with stage 1 through stage 4 chronic kidney disease, or unspecified chronic kidney disease: Secondary | ICD-10-CM | POA: Diagnosis not present

## 2020-03-12 DIAGNOSIS — N1831 Chronic kidney disease, stage 3a: Secondary | ICD-10-CM | POA: Diagnosis not present

## 2020-03-12 DIAGNOSIS — R69 Illness, unspecified: Secondary | ICD-10-CM | POA: Diagnosis not present

## 2020-03-12 DIAGNOSIS — H3530 Unspecified macular degeneration: Secondary | ICD-10-CM | POA: Diagnosis not present

## 2020-03-12 DIAGNOSIS — G629 Polyneuropathy, unspecified: Secondary | ICD-10-CM | POA: Diagnosis not present

## 2020-03-12 DIAGNOSIS — K219 Gastro-esophageal reflux disease without esophagitis: Secondary | ICD-10-CM | POA: Diagnosis not present

## 2020-03-12 DIAGNOSIS — K5901 Slow transit constipation: Secondary | ICD-10-CM | POA: Diagnosis not present

## 2020-03-12 NOTE — Chronic Care Management (AMB) (Signed)
  Care Management   Note  03/12/2020 Name: Hakeem Jwan Hornbaker. MRN: [id number] DOB: [birth year]  Jerime W [name]. is a 87 y.o. year old male who is a primary care patient of Cannady, Barbaraann Faster, NP and is actively engaged with the care management team. I reached out to [place]. by phone today to assist with re-scheduling an initial visit with the Licensed Clinical Social Worker  Follow up plan: Telephone appointment with care management team member scheduled for:03/29/2020  Noreene Larsson, [place], [place], [place] [address] Direct Dial: [phone] [email or web address] Website: [email or web address]

## 2020-03-13 ENCOUNTER — Telehealth (INDEPENDENT_AMBULATORY_CARE_PROVIDER_SITE_OTHER): Payer: Medicare HMO | Admitting: Nurse Practitioner

## 2020-03-13 ENCOUNTER — Encounter: Payer: Self-pay | Admitting: Nurse Practitioner

## 2020-03-13 ENCOUNTER — Other Ambulatory Visit: Payer: Medicare HMO

## 2020-03-13 ENCOUNTER — Other Ambulatory Visit: Payer: Self-pay

## 2020-03-13 DIAGNOSIS — U071 COVID-19: Secondary | ICD-10-CM | POA: Insufficient documentation

## 2020-03-13 DIAGNOSIS — Z20822 Contact with and (suspected) exposure to covid-19: Secondary | ICD-10-CM

## 2020-03-13 DIAGNOSIS — Z8616 Personal history of COVID-19: Secondary | ICD-10-CM | POA: Insufficient documentation

## 2020-03-13 MED ORDER — PREDNISONE 20 MG PO TABS
40.0000 mg | ORAL_TABLET | Freq: Every day | ORAL | 0 refills | Status: AC
Start: 2020-03-13 — End: 2020-03-18

## 2020-03-13 MED ORDER — BENZONATATE 100 MG PO CAPS
100.0000 mg | ORAL_CAPSULE | Freq: Two times a day (BID) | ORAL | 0 refills | Status: DC | PRN
Start: 2020-03-13 — End: 2020-04-01

## 2020-03-13 MED ORDER — ALBUTEROL SULFATE HFA 108 (90 BASE) MCG/ACT IN AERS
2.0000 | INHALATION_SPRAY | Freq: Four times a day (QID) | RESPIRATORY_TRACT | 0 refills | Status: DC | PRN
Start: 2020-03-13 — End: 2020-06-28

## 2020-03-13 NOTE — Progress Notes (Signed)
There were no vitals taken for this visit.   Subjective:    Patient ID: Jason Cobb., male    DOB: 14-Oct-1928, 87 y.o.   MRN: [id number]  HPI: Jason W Emilliano Dilworth. is a 87 y.o. male  Chief Complaint  Patient presents with  . Covid Exposure    Pt states he has a minor cough and coughing up phlegm, started about 2 days ago. Runny nose as well. Patient wife is positive for COVID     . This visit was completed via MyChart due to the restrictions of the COVID-19 pandemic. All issues as above were discussed and addressed. Physical exam was done as above through visual confirmation on MyChart. If it was felt that the patient should be evaluated in the office, they were directed there. The patient verbally consented to this visit. . Location of the patient: home . Location of the provider: work . Those involved with this call:  . Provider: Marnee Guarneri, DNP . CMA: Louanna Raw, CMA . Front Desk/Registration: Jill Side  . Time spent on call: 21 minutes with patient face to face via video conference. More than 50% of this time was spent in counseling and coordination of care. 15 minutes total spent in review of patient's record and preparation of their chart.  . I verified patient identity using two factors (patient name and date of birth). Patient consents verbally to being seen via telemedicine visit today.   Wife present on video to assist with HPI.  COVID EXPOSURE His wife was positive for Covid -- test returned yesterday.  Her symptoms started last Thursday.  Patient symptoms started 2 days ago.  His wife did attempt to quarantine -- is care giver.  She reports he has a cough, but overall breathing fine -- PT at home yesterday and all vital signs normal.  Has had 2 Covid vaccines.  He had testing this morning at 1100. Fever: no Cough: yes Shortness of breath: no Wheezing: no Chest pain: no Chest tightness: no Chest congestion: no Nasal congestion: yes Runny nose:  yes Post nasal drip: no Sneezing: no Sore throat: yes Swollen glands: no Sinus pressure: no Headache: no Face pain: no Toothache: no Ear pain: none Ear pressure: none Eyes red/itching:no Eye drainage/crusting: no  Vomiting: no Rash: no Fatigue: yes Sick contacts: yes Strep contacts: no  Context: stable Recurrent sinusitis: no Relief with OTC cold/cough medications: no  Treatments attempted: none   Relevant past medical, surgical, family and social history reviewed and updated as indicated. Interim medical history since our last visit reviewed. Allergies and medications reviewed and updated.  Review of Systems  Constitutional: Positive for fatigue. Negative for activity change, appetite change, diaphoresis and fever.  HENT: Positive for congestion, ear pain, postnasal drip and rhinorrhea. Negative for ear discharge, sinus pressure, sinus pain, sore throat and voice change.   Respiratory: Positive for cough. Negative for chest tightness, shortness of breath and wheezing.   Cardiovascular: Negative for chest pain, palpitations and leg swelling.  Gastrointestinal: Negative.   Neurological: Negative.   Psychiatric/Behavioral: Negative.     Per HPI unless specifically indicated above     Objective:    There were no vitals taken for this visit.  Wt Readings from Last 3 Encounters:  12/22/19 169 lb (76.7 kg)  08/29/19 162 lb (73.5 kg)  07/17/19 165 lb 12.8 oz (75.2 kg)    Physical Exam Vitals and nursing note reviewed.  Constitutional:      General: He is  awake. He is not in acute distress.    Appearance: He is well-developed and well-groomed. He is not ill-appearing or toxic-appearing.  HENT:     Head: Normocephalic.     Right Ear: Hearing normal. No drainage.     Left Ear: Hearing normal. No drainage.  Eyes:     General: Lids are normal.        Right eye: No discharge.        Left eye: No discharge.     Conjunctiva/sclera: Conjunctivae normal.  Pulmonary:      Effort: Pulmonary effort is normal. No accessory muscle usage or respiratory distress.     Comments: Unable to auscultate due to virtual exam only.  No cough present during visit and no SOB noted. Musculoskeletal:     Cervical back: Normal range of motion.  Neurological:     Mental Status: He is alert and oriented to person, place, and time.  Psychiatric:        Mood and Affect: Mood normal.        Behavior: Behavior normal. Behavior is cooperative.        Thought Content: Thought content normal.        Judgment: Judgment normal.    Results for orders placed or performed in visit on 123XX123  Basic metabolic panel  Result Value Ref Range   Glucose 98 65 - 99 mg/dL   BUN 25 8 - 27 mg/dL   Creatinine, Ser 1.39 (H) 0.76 - 1.27 mg/dL   GFR calc non Af Amer 45 (L) >59 mL/min/1.73   GFR calc Af Amer 52 (L) >59 mL/min/1.73   BUN/Creatinine Ratio 18 10 - 24   Sodium 139 134 - 144 mmol/L   Potassium 5.0 3.5 - 5.2 mmol/L   Chloride 105 96 - 106 mmol/L   CO2 22 20 - 29 mmol/L   Calcium 8.9 8.6 - 10.2 mg/dL  TSH  Result Value Ref Range   TSH 0.485 0.450 - 4.500 uIU/mL      Assessment & Plan:   Problem List Items Addressed This Visit      Other   Close exposure to COVID-19 virus    Exposure to wife who is Covid +.  Currently with symptoms for 2 days.  Is vaccinated x 2.  Testing obtained this morning.  At this time recommend self quarantine x 10 days and ensure symptoms have improved.  Will send in short burst of Prednisone and Albuterol inhaler + Tessalon to use as needed.   I would recommend starting Vitamin C 500 MG twice a day, Quercetin 250-500 MG twice a day, Zinc 75-100 MG daily, and Vitamin D3 2000-4000 units daily to help.  You may also use over the counter cold medications for symptom relief - Coricidin or Mucinex plain.  Ensure good hydration and rest.  Tylenol for fever if presents. If any worsening SOB or complaint of CP immediately go to ER.  Discussed with wife, will place  referral to Covid treatment group if testing returns positive due to his higher risk -- alerted her due to low supply and current guidelines he may not qualify and it is taking 48 hours or more for team to reach out to patient at times.  She agrees with above plan.           I discussed the assessment and treatment plan with the patient. The patient was provided an opportunity to ask questions and all were answered. The patient agreed with the plan and  demonstrated an understanding of the instructions.   The patient was advised to call back or seek an in-person evaluation if the symptoms worsen or if the condition fails to improve as anticipated.   I provided 21+ minutes of time during this encounter.  Follow up plan: Return if symptoms worsen or fail to improve.

## 2020-03-13 NOTE — Patient Instructions (Signed)

## 2020-03-13 NOTE — Assessment & Plan Note (Signed)
Exposure to wife who is Covid +.  Currently with symptoms for 2 days.  Is vaccinated x 2.  Testing obtained this morning.  At this time recommend self quarantine x 10 days and ensure symptoms have improved.  Will send in short burst of Prednisone and Albuterol inhaler + Tessalon to use as needed.   I would recommend starting Vitamin C 500 MG twice a day, Quercetin 250-500 MG twice a day, Zinc 75-100 MG daily, and Vitamin D3 2000-4000 units daily to help.  You may also use over the counter cold medications for symptom relief - Coricidin or Mucinex plain.  Ensure good hydration and rest.  Tylenol for fever if presents. If any worsening SOB or complaint of CP immediately go to ER.  Discussed with wife, will place referral to Covid treatment group if testing returns positive due to his higher risk -- alerted her due to low supply and current guidelines he may not qualify and it is taking 48 hours or more for team to reach out to patient at times.  She agrees with above plan.

## 2020-03-15 DIAGNOSIS — I129 Hypertensive chronic kidney disease with stage 1 through stage 4 chronic kidney disease, or unspecified chronic kidney disease: Secondary | ICD-10-CM | POA: Diagnosis not present

## 2020-03-15 DIAGNOSIS — K5901 Slow transit constipation: Secondary | ICD-10-CM | POA: Diagnosis not present

## 2020-03-15 DIAGNOSIS — I2510 Atherosclerotic heart disease of native coronary artery without angina pectoris: Secondary | ICD-10-CM | POA: Diagnosis not present

## 2020-03-15 DIAGNOSIS — M4186 Other forms of scoliosis, lumbar region: Secondary | ICD-10-CM | POA: Diagnosis not present

## 2020-03-15 DIAGNOSIS — G629 Polyneuropathy, unspecified: Secondary | ICD-10-CM | POA: Diagnosis not present

## 2020-03-15 DIAGNOSIS — K219 Gastro-esophageal reflux disease without esophagitis: Secondary | ICD-10-CM | POA: Diagnosis not present

## 2020-03-15 DIAGNOSIS — I951 Orthostatic hypotension: Secondary | ICD-10-CM | POA: Diagnosis not present

## 2020-03-15 DIAGNOSIS — R69 Illness, unspecified: Secondary | ICD-10-CM | POA: Diagnosis not present

## 2020-03-15 DIAGNOSIS — H3530 Unspecified macular degeneration: Secondary | ICD-10-CM | POA: Diagnosis not present

## 2020-03-15 DIAGNOSIS — N1831 Chronic kidney disease, stage 3a: Secondary | ICD-10-CM | POA: Diagnosis not present

## 2020-03-15 LAB — SARS-COV-2, NAA 2 DAY TAT

## 2020-03-15 LAB — NOVEL CORONAVIRUS, NAA: SARS-CoV-2, NAA: DETECTED — AB

## 2020-03-15 NOTE — Progress Notes (Signed)
Good morning please let Jason Cross wife know his testing did return positive.  If symptoms remain mild that is good.  I would recommend starting Vitamin C 500 MG twice a day, Quercetin 250-500 MG twice a day, Zinc 75-100 MG daily, and Vitamin D3 2000-4000 units daily to help.  You may also use over the counter cold medications for symptom relief.  Ensure good hydration and rest.  Tylenol for fever if presents.  Stay quarantined for at least 10 days. If worsening symptoms let me know today as he may qualify for outpatient treatment, but I know supply is low on this still so there is no guarantee for this, but I could place a referral to treatment team to reach out.

## 2020-03-21 ENCOUNTER — Telehealth: Payer: Self-pay

## 2020-03-21 NOTE — Telephone Encounter (Signed)
Pt's wife came in and stated pt fell while pumping gas went and told Dr.Johson as PCP was not in office and was verbally recommended to go to Urgent care or ED as we had no available appointments. Pt's wife verbalized understanding.

## 2020-03-29 ENCOUNTER — Telehealth: Payer: Self-pay

## 2020-03-29 ENCOUNTER — Ambulatory Visit (INDEPENDENT_AMBULATORY_CARE_PROVIDER_SITE_OTHER): Payer: Medicare HMO | Admitting: Licensed Clinical Social Worker

## 2020-03-29 DIAGNOSIS — R69 Illness, unspecified: Secondary | ICD-10-CM | POA: Diagnosis not present

## 2020-03-29 DIAGNOSIS — N1831 Chronic kidney disease, stage 3a: Secondary | ICD-10-CM

## 2020-03-29 DIAGNOSIS — F0390 Unspecified dementia without behavioral disturbance: Secondary | ICD-10-CM | POA: Diagnosis not present

## 2020-03-29 DIAGNOSIS — Z20822 Contact with and (suspected) exposure to covid-19: Secondary | ICD-10-CM

## 2020-03-29 DIAGNOSIS — I10 Essential (primary) hypertension: Secondary | ICD-10-CM

## 2020-03-29 NOTE — Chronic Care Management (AMB) (Signed)
Chronic Care Management    Clinical Social Work Note  03/29/2020 Name: Jason Cross. MRN: [id number] DOB: 03-18-1928  Jason Cross. is a 87 y.o. year old male who is a primary care patient of Cannady, Dorie Rank, NP. The CCM team was consulted to assist the patient with chronic disease management and/or care coordination needs related to: Level of Care Concerns.   Engaged with patient by telephone for initial visit in response to provider referral for social work chronic care management and care coordination services.   Consent to Services:  The patient was given the following information about Chronic Care Management services today, agreed to services, and gave verbal consent: 1. CCM service includes personalized support from designated clinical staff supervised by the primary care provider, including individualized plan of care and coordination with other care providers 2. 24/7 contact phone numbers for assistance for urgent and routine care needs. 3. Service will only be billed when office clinical staff spend 20 minutes or more in a month to coordinate care. 4. Only one practitioner may furnish and bill the service in a calendar month. 5.The patient may stop CCM services at any time (effective at the end of the month) by phone call to the office staff. 6. The patient will be responsible for cost sharing (co-pay) of up to 20% of the service fee (after annual deductible is met). Patient agreed to services and consent obtained.  Patient agreed to services and consent obtained.   Assessment: Review of patient past medical history, allergies, medications, and health status, including review of relevant consultants reports was performed today as part of a comprehensive evaluation and provision of chronic care management and care coordination services.     SDOH (Social Determinants of Health) assessments and interventions performed:    Advanced Directives Status: See Care Plan for  related entries.  CCM Care Plan  Allergies  Allergen Reactions  . Contrast Media [Iodinated Diagnostic Agents] Hives, Itching and Rash  . Codeine Nausea And Vomiting    Outpatient Encounter Medications as of 03/29/2020  Medication Sig  . acetaminophen (TYLENOL) 500 MG tablet Take 500 mg by mouth every 6 (six) hours as needed.  Marland Kitchen albuterol (VENTOLIN HFA) 108 (90 Base) MCG/ACT inhaler Inhale 2 puffs into the lungs every 6 (six) hours as needed for wheezing or shortness of breath.  Marland Kitchen aspirin EC 81 MG tablet Take 81 mg by mouth every morning.  . benzonatate (TESSALON) 100 MG capsule Take 1 capsule (100 mg total) by mouth 2 (two) times daily as needed for cough.  . donepezil (ARICEPT) 10 MG tablet Take 1 tablet (10 mg total) by mouth at bedtime.  . gabapentin (NEURONTIN) 300 MG capsule Take 1 capsule by mouth 3 (three) times daily.  Marland Kitchen losartan (COZAAR) 25 MG tablet Take 25 mg by mouth daily.  . Multiple Vitamins-Minerals (PRESERVISION AREDS 2 PO) Take 2 tablets by mouth daily. (Patient not taking: Reported on 03/13/2020)  . omeprazole (PRILOSEC) 40 MG capsule Take 40 mg by mouth daily.   No facility-administered encounter medications on file as of 03/29/2020.    Patient Active Problem List   Diagnosis Date Noted  . Close exposure to COVID-19 virus 03/13/2020  . CKD (chronic kidney disease) stage 3, GFR 30-59 ml/min (HCC) 11/26/2019  . Difficulty walking 08/04/2017  . Expressive aphasia 08/04/2017  . Tremor 08/04/2017  . Advanced care planning/counseling discussion 07/21/2016  . GERD (gastroesophageal reflux disease) 12/30/2015  . Peripheral neuropathy 07/17/2015  . Dementia (HCC)  07/17/2015  . Orthostatic hypotension 07/17/2015  . Hypertension 07/17/2015  . Coronary artery disease involving coronary bypass graft of native heart without angina pectoris 07/17/2015  . Urinary retention with incomplete bladder emptying 03/20/2013  . Constipation due to slow transit 03/20/2013  . Lumbar  spondylolysis 03/14/2013  . Lumbar scoliosis 03/10/2013    Conditions to be addressed/monitored: Dementia; Level of care concerns, ADL IADL limitations, Social Isolation and Limited access to caregiver  Care Plan : General Social Work (Adult)  Updates made by Greg Cutter, LCSW since 03/29/2020 12:00 AM    Problem: Coping Skills (General Plan of Care)     Long-Range Goal: Coping Skills Enhanced   Start Date: 03/29/2020  Priority: Medium  Note:   Evidence-based guidance:   Acknowledge, normalize and validate difficulty of making life-long lifestyle changes.   Identify current effective and ineffective coping strategies.   Encourage patient and caregiver participation in care to increase self-esteem, confidence and feelings of control.   Consider alternative and complementary therapy approaches such as meditation, mindfulness or yoga.   Encourage participation in cognitive behavioral therapy to foster a positive identity, increase self-awareness, as well as bolster self-esteem, confidence and self-efficacy.   Discuss spirituality; be present as concerns are identified; encourage journaling, prayer, worship services, meditation or pastoral counseling.   Encourage participation in pleasurable group activities such as hobbies, singing, sports or volunteering).   Encourage the use of mindfulness; refer for training or intensive intervention.   Consider the use of meditative movement therapy such as tai chi, yoga or qigong.   Promote a regular daily exercise program based on tolerance, ability and patient choice to support positive thinking about disease or aging.   Notes:  Evidence-based guidance:   Assess patient's thoughts about quality of life, goals and expectations, and dissatisfaction or desire to improve.   Identify issues of primary importance such as mental health, illness, exercise tolerance, pain, sexual function and intimacy, cognitive change, social isolation, finances  and relationships.   Assess and monitor for signs/symptoms of psychosocial concerns, especially depression or ideations regarding harm to others or self; provide or refer for mental health services as needed.   Identify sensory issues that impact quality of life such as hearing loss, vision deficit; strategize ways to maintain or improve hearing, vision.   Promote access to services in the community to support independence such as support groups, home visiting programs, financial assistance, handicapped parking tags, durable medical equipment and emergency responder.   Promote activities to decrease social isolation such as group support or social, leisure and recreational activities, employment, use of social media; consider safety concerns about being out of home for activities.   Provide patient an opportunity to share by storytelling or a "life review" to give positive meaning to life and to assist with coping and negative experiences.   Encourage patient to tap into hope to improve sense of self.   Counsel based on prognosis and as early as possible about end-of-life and palliative care; consider referral to palliative care provider.   Advocate for the development of palliative care plan that may include avoidance of unnecessary testing and intervention, symptom control, discontinuation of medications, hospice and organ donation.   Counsel as early as possible those with life-limiting chronic disease about palliative care; consider referral to palliative care provider.   Advocate for the development of palliative care plan.   Notes:   Timeframe:  Long-Range Goal Priority:  Medium  Start Date:  03/29/20  Expected End Date:  06/26/20                   Follow Up Date- 05/30/20   Current Barriers:  . Acute Mental Health needs related to dementia . Limited social support, ADL IADL limitations, Social Isolation, and Limited access to caregiver  Clinical Social Work  Goal(s):  Marland Kitchen Over the next 120 days, patient/caregiver will work with SW to address concerns related to care coordination needs, lack of a stable support network and lack of Brewing technologist. LCSW will assist patient in gaining additional support in order to maintain health and mental health appropriately  . Over the next 120 days, patient will demonstrate improved adherence to self care as evidenced by implementing healthy self-care into his daily routine such as: attending all medical appointments, deep breathing exercises, taking time for self-reflection, taking medications as prescribed, drinking water and daily exercise to improve mobility and mood.  . Over the next 120 days, patient will demonstrate improved health management independence as evidenced by implementing healthy self-care skills and positive support/resource implementation into their daily routine to help cope with stressors and improve overall health and well-being  . Over the next 120 days, patient or caregiver will verbalize basic understanding of depression/stress process and self health management plan as evidenced by his participation in development of long term plan of care and institution of self health management strategies  Interventions: . Patient interviewed and appropriate assessments performed: brief mental health assessment . Patient is recovering from [place] but spouse reports that his case was not as bad as hers. She reports that patient had a fall while pumping gas and one bruise on his head and another on his leg but no other injuries. . Patient is [place] but family is having difficulty getting in home support through program. However, spouse reports that she called the [place] and was informed that he is not actively receiving benefits and not in their system. [facility] specialist resources provided to spouse.  Marland Kitchen CCM LCSW sent secure text message with resources for [facility] specialist and [place]. . Discussed plans with patient for ongoing care management follow up and provided patient with direct contact information for care management team . Assisted patient/caregiver with obtaining information about health plan benefits . Provided education and assistance to client regarding Advanced Directives. . Provided education to patient/caregiver regarding level of care options. . Provided education to patient/caregiver about Hospice and/or Palliative Care services . Motivational Interviewing, Sleep Hygiene, and Psychoeducation and/or Health Education  Patient Self Care Activities:  . Self administers medications as prescribed, Attends all scheduled provider appointments, Ability for insight, Independent living, Motivation for treatment, and Strong family or social support  Patient Coping Strengths:  . Supportive Relationships . Self Advocate . Able to Communicate Effectively  Patient Self Care Deficits:  . Lacks Scientist, product/process development  Initial goal documentation     Task: Support Psychosocial Response to Risk or Actual Health Condition   Note:   Care Management Activities:    - active listening utilized - caregiver stress acknowledged - counseling provided - current coping strategies identified - decision-making supported - healthy lifestyle promoted - journaling promoted - meditative movement therapy encouraged - mindfulness encouraged - participation in counseling encouraged - problem-solving facilitated - relaxation techniques promoted - self-reflection promoted - spiritual activities promoted - verbalization of feelings encouraged    Notes:       Follow Up Plan: SW will follow up with patient by phone over  the next quarter      Eula Fried, [place], MSW, [email or web address] Phone: [phone]

## 2020-03-29 NOTE — Telephone Encounter (Signed)
Copied from [place] [id number]. Topic: General - Other >> Mar 29, 2020  2:30 PM Leward Quan A wrote: Reason for CRM: Patient wife called in to inquire of [name] to please send an Rx to the patient pharmacy he is still having sinus drainage and lingering cough. Per wife she does not want his symptoms to get any worst. Please advise Ph# [phone]   Lvm to schedule virtual apt. If pt calls back please schedule as virtual. S.Arina Torry

## 2020-03-31 ENCOUNTER — Other Ambulatory Visit: Payer: Self-pay | Admitting: Nurse Practitioner

## 2020-04-01 ENCOUNTER — Encounter: Payer: Self-pay | Admitting: Nurse Practitioner

## 2020-04-01 ENCOUNTER — Ambulatory Visit (INDEPENDENT_AMBULATORY_CARE_PROVIDER_SITE_OTHER): Payer: Medicare HMO | Admitting: Nurse Practitioner

## 2020-04-01 DIAGNOSIS — U071 COVID-19: Secondary | ICD-10-CM | POA: Diagnosis not present

## 2020-04-01 MED ORDER — FLUTICASONE PROPIONATE 50 MCG/ACT NA SUSP: 2.0000 | Freq: Every day | NASAL | 6 refills | Status: DC

## 2020-04-01 MED ORDER — PREDNISONE 20 MG PO TABS: 40.0000 mg | ORAL_TABLET | Freq: Every day | ORAL | 0 refills | Status: AC

## 2020-04-01 NOTE — Patient Instructions (Signed)
Person Under Monitoring Name: Jason Cross.  Location: [address] [place] [address]   Infection Prevention Recommendations for Individuals Confirmed to have, or Being Evaluated for, 2019 Novel Coronavirus (COVID-19) Infection Who Receive Care at Home  Individuals who are confirmed to have, or are being evaluated for, COVID-19 should follow the prevention steps below until a healthcare provider or local or state health department says they can return to normal activities.  Stay home except to get medical care You should restrict activities outside your home, except for getting medical care. Do not go to work, school, or public areas, and do not use public transportation or taxis.  Call ahead before visiting your doctor Before your medical appointment, call the healthcare provider and tell them that you have, or are being evaluated for, COVID-19 infection. This will help the healthcare provider's office take steps to keep other people from getting infected. Ask your healthcare provider to call the local or state health department.  Monitor your symptoms Seek prompt medical attention if your illness is worsening (e.g., difficulty breathing). Before going to your medical appointment, call the healthcare provider and tell them that you have, or are being evaluated for, COVID-19 infection. Ask your healthcare provider to call the local or state health department.  Wear a facemask You should wear a facemask that covers your nose and mouth when you are in the same room with other people and when you visit a healthcare provider. People who live with or visit you should also wear a facemask while they are in the same room with you.  Separate yourself from other people in your home As much as possible, you should stay in a different room from other people in your home. Also, you should use a separate bathroom, if available.  Avoid sharing household items You should not  share dishes, drinking glasses, cups, eating utensils, towels, bedding, or other items with other people in your home. After using these items, you should wash them thoroughly with soap and water.  Cover your coughs and sneezes Cover your mouth and nose with a tissue when you cough or sneeze, or you can cough or sneeze into your sleeve. Throw used tissues in a lined trash can, and immediately wash your hands with soap and water for at least 20 seconds or use an alcohol-based hand rub.  Wash your [employer] [employer] your hands often and thoroughly with soap and water for at least 20 seconds. You can use an alcohol-based hand sanitizer if soap and water are not available and if your hands are not visibly dirty. Avoid touching your eyes, nose, and mouth with unwashed hands.   Prevention Steps for Caregivers and Household Members of Individuals Confirmed to have, or Being Evaluated for, COVID-19 Infection Being Cared for in the Home  If you live with, or provide care at home for, a person confirmed to have, or being evaluated for, COVID-19 infection please follow these guidelines to prevent infection:  Follow healthcare provider's instructions Make sure that you understand and can help the patient follow any healthcare provider instructions for all care.  Provide for the patient's basic needs You should help the patient with basic needs in the home and provide support for getting groceries, prescriptions, and other personal needs.  Monitor the patient's symptoms If they are getting sicker, call his or her medical provider and tell them that the patient has, or is being evaluated for, COVID-19 infection. This will help the healthcare  provider's office take steps to keep other people from getting infected. Ask the healthcare provider to call the local or state health department.  Limit the number of people who have contact with the patient  If possible, have only one caregiver for the  patient.  Other household members should stay in another home or place of residence. If this is not possible, they should stay  in another room, or be separated from the patient as much as possible. Use a separate bathroom, if available.  Restrict visitors who do not have an essential need to be in the home.  Keep older adults, very young children, and other sick people away from the patient Keep older adults, very young children, and those who have compromised immune systems or chronic health conditions away from the patient. This includes people with chronic heart, lung, or kidney conditions, diabetes, and cancer.  Ensure good ventilation Make sure that shared spaces in the home have good air flow, such as from an air conditioner or an opened window, weather permitting.  Wash your hands often  Wash your hands often and thoroughly with soap and water for at least 20 seconds. You can use an alcohol based hand sanitizer if soap and water are not available and if your hands are not visibly dirty.  Avoid touching your eyes, nose, and mouth with unwashed hands.  Use disposable paper towels to dry your hands. If not available, use dedicated cloth towels and replace them when they become wet.  Wear a facemask and gloves  Wear a disposable facemask at all times in the room and gloves when you touch or have contact with the patient's blood, body fluids, and/or secretions or excretions, such as sweat, saliva, sputum, nasal mucus, vomit, urine, or feces.  Ensure the mask fits over your nose and mouth tightly, and do not touch it during use.  Throw out disposable facemasks and gloves after using them. Do not reuse.  Wash your hands immediately after removing your facemask and gloves.  If your personal clothing becomes contaminated, carefully remove clothing and launder. Wash your hands after handling contaminated clothing.  Place all used disposable facemasks, gloves, and other waste in a lined  container before disposing them with other household waste.  Remove gloves and wash your hands immediately after handling these items.  Do not share dishes, glasses, or other household items with the patient  Avoid sharing household items. You should not share dishes, drinking glasses, cups, eating utensils, towels, bedding, or other items with a patient who is confirmed to have, or being evaluated for, COVID-19 infection.  After the person uses these items, you should wash them thoroughly with soap and water.  Wash laundry thoroughly  Immediately remove and wash clothes or bedding that have blood, body fluids, and/or secretions or excretions, such as sweat, saliva, sputum, nasal mucus, vomit, urine, or feces, on them.  Wear gloves when handling laundry from the patient.  Read and follow directions on labels of laundry or clothing items and detergent. In general, wash and dry with the warmest temperatures recommended on the label.  Clean all areas the individual has used often  Clean all touchable surfaces, such as counters, tabletops, doorknobs, bathroom fixtures, toilets, phones, keyboards, tablets, and bedside tables, every day. Also, clean any surfaces that may have blood, body fluids, and/or secretions or excretions on them.  Wear gloves when cleaning surfaces the patient has come in contact with.  Use a diluted bleach solution (e.g., dilute bleach with  1 part bleach and 10 parts water) or a household disinfectant with a label that says EPA-registered for coronaviruses. To make a bleach solution at home, add 1 tablespoon of bleach to 1 quart (4 cups) of water. For a larger supply, add  cup of bleach to 1 gallon (16 cups) of water.  Read labels of cleaning products and follow recommendations provided on product labels. Labels contain instructions for safe and effective use of the cleaning product including precautions you should take when applying the product, such as wearing gloves or  eye protection and making sure you have good ventilation during use of the product.  Remove gloves and wash hands immediately after cleaning.  Monitor yourself for signs and symptoms of illness Caregivers and household members are considered close contacts, should monitor their health, and will be asked to limit movement outside of the home to the extent possible. Follow the monitoring steps for close contacts listed on the symptom monitoring form.   ? If you have additional questions, contact your local health department or call the epidemiologist on call at [phone] (available 24/7). ? This guidance is subject to change. For the most up-to-date guidance from [facility], please refer to their website: [email or web address]

## 2020-04-01 NOTE — Assessment & Plan Note (Signed)
Diagnosed 03/13/2020 - with ongoing mild nasal drainage and mild cough.  At this time decline CXR.  Will continue Zyrtec, which wife reports is offering benefit.  Send in Prednisone 40 MG daily burst x 5 days and Flonase daily scripts.  Recommend Mucinex and Tessalon at home as needed for cough.  Continue plenty of rest and fluids + daily vitamin supplements.  If ongoing symptoms then consider imaging for further evaluation.  Return to office as scheduled, sooner if ongoing or worsening symptoms.

## 2020-04-01 NOTE — Progress Notes (Signed)
There were no vitals taken for this visit.   Subjective:    Patient ID: Jason Cross., male    DOB: 1928-12-12, 87 y.o.   MRN: [id number]  HPI: Jason Cross. is a 87 y.o. male  Chief Complaint  Patient presents with  . Cough    Contnued cough and congestion since covid.     . This visit was completed via telephone due to the restrictions of the COVID-19 pandemic. All issues as above were discussed and addressed but no physical exam was performed. If it was felt that the patient should be evaluated in the office, they were directed there. The patient verbally consented to this visit. Patient was unable to complete an audio/visual visit due to telephone only. Due to the catastrophic nature of the COVID-19 pandemic, this visit was done through audio contact only. . Location of the patient: home . Location of the provider: work . Those involved with this call:  . Provider: Marnee Guarneri, DNP . CMA: Louanna Raw, CMA . Front Desk/Registration: Jill Side  . Time spent on call: 20 minutes on the phone discussing health concerns. 15 minutes total spent in review of patient's record and preparation of their chart.  . I verified patient identity using two factors (patient name and date of birth). Patient consents verbally to being seen via telemedicine visit today.   Wife present on phone to assist with HPI.  COVID SYMPTOMS Tested positive for Covid on 03/13/2020 -- his wife reports he still has some congestion, cough, nasal drip -- cough is "almost normal" and non productive -- "almost consider cough non existent".  His wife got some Zytrec over weekend, which helped with nasal drainage.   She reports he has a cough, but overall breathing fine -- PT at home and all vital signs normal.  Has had 2 Covid vaccines.  Fever: no Cough: yes Shortness of breath: no Wheezing: no Chest pain: no Chest tightness: no Chest congestion: no Nasal congestion: yes Runny nose:  yes Post nasal drip: no Sneezing: no Sore throat: none Swollen glands: no Sinus pressure: no Headache: no Face pain: no Toothache: no Ear pain: none Ear pressure: none Eyes red/itching:no Eye drainage/crusting: no  Vomiting: no Rash: no Fatigue: yes Sick contacts: yes Strep contacts: no  Context: stable Recurrent sinusitis: no Relief with OTC cold/cough medications: yes  Treatments attempted: Zyrtec  Relevant past medical, surgical, family and social history reviewed and updated as indicated. Interim medical history since our last visit reviewed. Allergies and medications reviewed and updated.  Review of Systems  Constitutional: Negative for activity change, appetite change, diaphoresis, fatigue and fever.  HENT: Positive for congestion, postnasal drip and rhinorrhea. Negative for ear discharge, ear pain, sinus pressure, sinus pain, sore throat and voice change.   Respiratory: Positive for cough. Negative for chest tightness, shortness of breath and wheezing.   Cardiovascular: Negative for chest pain, palpitations and leg swelling.  Gastrointestinal: Negative.   Neurological: Negative.   Psychiatric/Behavioral: Negative.     Per HPI unless specifically indicated above     Objective:    There were no vitals taken for this visit.  Wt Readings from Last 3 Encounters:  12/22/19 169 lb (76.7 kg)  08/29/19 162 lb (73.5 kg)  07/17/19 165 lb 12.8 oz (75.2 kg)    Physical Exam   Unable to perform due to phone visit only  Results for orders placed or performed in visit on 03/13/20  Novel Coronavirus, NAA ([facility])  Specimen: Nasopharyngeal(NP) swabs in vial transport medium  Result Value Ref Range   SARS-CoV-2, NAA Detected (A) Not Detected  SARS-COV-2, NAA 2 DAY TAT  Result Value Ref Range   SARS-CoV-2, NAA 2 DAY TAT Performed       Assessment & Plan:   Problem List Items Addressed This Visit      Other   COVID-19 virus infection    Diagnosed 03/13/2020 -  with ongoing mild nasal drainage and mild cough.  At this time decline CXR.  Will continue Zyrtec, which wife reports is offering benefit.  Send in Prednisone 40 MG daily burst x 5 days and Flonase daily scripts.  Recommend Mucinex and Tessalon at home as needed for cough.  Continue plenty of rest and fluids + daily vitamin supplements.  If ongoing symptoms then consider imaging for further evaluation.  Return to office as scheduled, sooner if ongoing or worsening symptoms.         I discussed the assessment and treatment plan with the patient. The patient was provided an opportunity to ask questions and all were answered. The patient agreed with the plan and demonstrated an understanding of the instructions.   The patient was advised to call back or seek an in-person evaluation if the symptoms worsen or if the condition fails to improve as anticipated.   I provided 21+ minutes of time during this encounter.  Follow up plan: Return if symptoms worsen or fail to improve.

## 2020-05-30 ENCOUNTER — Telehealth: Payer: Self-pay

## 2020-05-30 ENCOUNTER — Telehealth: Payer: Self-pay | Admitting: Licensed Clinical Social Worker

## 2020-05-30 NOTE — Telephone Encounter (Signed)
  Chronic Care Management    Clinical Social Work General Follow Up Note  05/30/2020 Name: Jason Cross. MRN: [id number] DOB: Jul 01, 1928  Jason Cross. is a 87 y.o. year old male who is a primary care patient of Cannady, Jason Faster, NP. The CCM team was consulted for assistance with [employer] .   Review of patient status, including review of consultants reports, relevant laboratory and other test results, and collaboration with appropriate care team members and the patient's provider was performed as part of comprehensive patient evaluation and provision of chronic care management services.    LCSW completed CCM outreach attempt today but was unable to reach patient or spouse successfully. A HIPPA compliant voice message was left encouraging patient to return call once available. LCSW will ask Scheduling Care Guide to reschedule CCM SW appointment with patient as well.  Outpatient Encounter Medications as of 05/30/2020  Medication Sig  . acetaminophen (TYLENOL) 500 MG tablet Take 500 mg by mouth every 6 (six) hours as needed.  Marland Kitchen albuterol (VENTOLIN HFA) 108 (90 Base) MCG/ACT inhaler Inhale 2 puffs into the lungs every 6 (six) hours as needed for wheezing or shortness of breath.  Marland Kitchen aspirin EC 81 MG tablet Take 81 mg by mouth every morning.  . donepezil (ARICEPT) 10 MG tablet Take 1 tablet (10 mg total) by mouth at bedtime.  . fluticasone (FLONASE) 50 MCG/ACT nasal spray Place 2 sprays into both nostrils daily.  Marland Kitchen gabapentin (NEURONTIN) 300 MG capsule Take 1 capsule by mouth 3 (three) times daily.  Marland Kitchen losartan (COZAAR) 25 MG tablet Take 25 mg by mouth daily.  . Multiple Vitamins-Minerals (PRESERVISION AREDS 2 PO) Take 2 tablets by mouth daily.  Marland Kitchen omeprazole (PRILOSEC) 40 MG capsule Take 40 mg by mouth daily.   No facility-administered encounter medications on file as of 05/30/2020.   Follow Up Plan: [place] will reach out to patient to reschedule appointment.    Eula Fried, BSW, MSW, [facility] [email or web address] Phone: [phone]

## 2020-05-31 ENCOUNTER — Ambulatory Visit: Payer: Self-pay | Admitting: *Deleted

## 2020-05-31 NOTE — Telephone Encounter (Signed)
Due to symptoms would prefer this be in office visit so we can perform better work-up please.  I also do recommend if these symptoms return over weekend that she immediately take him into ER for further assessment, especially in presence of his elevated BP at the time and O2 sat of 87, we like to see the O2 sat 90 or greater.  If she notices any chest pain, increased work of breathing, increased fatigue, or change in patient demeanor over weekend highly advise ER visit.

## 2020-05-31 NOTE — Telephone Encounter (Signed)
Pt and his  wife called stating the pt woke her up around 0130 saying he could not get a complete breath; the pt also states he had chest pain at that time; she called 911 and was told his O2 sats were 87, BP 180s/?; his EKG was also normal; the pt declined transport to the ED and was told to follow up with his PCP; she gave him an extra dose of losartan (25 mg at 1030); she says the pt is fine and is not having any problems bleeding; recommendations made per nurse triage protocol; she verbalized understanding; she was offered and accepted virtual appt appt on the pt's behalf; with Marnee Guarneri, [facility], 06/03/20 at 1540; she says  they do not have a computer and they can be contacted at [phone]; will route to office for notification of encounter.  Reason for Disposition . [1] MODERATE longstanding difficulty breathing (e.g., speaks in phrases, SOB even at rest, pulse 100-120) AND [2] SAME as normal  Answer Assessment - Initial Assessment Questions 1. RESPIRATORY STATUS: "Describe your breathing?" (e.g., wheezing, shortness of breath, unable to speak, severe coughing)      *No Answer* 2. ONSET: "When did this breathing problem begin?"      *No Answer* 3. PATTERN "Does the difficult breathing come and go, or has it been constant since it started?"      *No Answer* 4. SEVERITY: "How bad is your breathing?" (e.g., mild, moderate, severe)    - MILD: No SOB at rest, mild SOB with walking, speaks normally in sentences, can lay down, no retractions, pulse < 100.    - MODERATE: SOB at rest, SOB with minimal exertion and prefers to sit, cannot lie down flat, speaks in phrases, mild retractions, audible wheezing, pulse 100-120.    - SEVERE: Very SOB at rest, speaks in single words, struggling to breathe, sitting hunched forward, retractions, pulse > 120      *No Answer* 5. RECURRENT SYMPTOM: "Have you had difficulty breathing before?" If Yes, ask: "When was the last time?" and "What happened  that time?"  no 6. CARDIAC HISTORY: "Do you have any history of heart disease?" (e.g., heart attack, angina, bypass surgery, angioplasty)      MI 2010, HTN 7. LUNG HISTORY: "Do you have any history of lung disease?"  (e.g., pulmonary embolus, asthma, emphysema)   Hx COVID 8. CAUSE: "What do you think is causing the breathing problem?"      *No Answer* 9. OTHER SYMPTOMS: "Do you have any other symptoms? (e.g., dizziness, runny nose, cough, chest pain, fever)    no 10. PREGNANCY: "Is there any chance you are pregnant?" "When was your last menstrual period?"        11. TRAVEL: "Have you traveled out of the country in the last month?" (e.g., travel history, exposures)       [place]  05/19/20 - 05/27/20  Protocols used: BREATHING DIFFICULTY-A-AH

## 2020-05-31 NOTE — Telephone Encounter (Signed)
Pt will be coming in the office for apt  wife (DPR) verbalized and confirmed understanding of message read word for word note from pcp she confirmed understanding.

## 2020-06-03 ENCOUNTER — Other Ambulatory Visit: Payer: Self-pay

## 2020-06-03 ENCOUNTER — Ambulatory Visit (INDEPENDENT_AMBULATORY_CARE_PROVIDER_SITE_OTHER): Payer: Medicare HMO | Admitting: Nurse Practitioner

## 2020-06-03 ENCOUNTER — Encounter: Payer: Self-pay | Admitting: Nurse Practitioner

## 2020-06-03 VITALS — BP 121/74 | HR 75 | Temp 98.3°F | Wt 166.2 lb

## 2020-06-03 DIAGNOSIS — R079 Chest pain, unspecified: Secondary | ICD-10-CM | POA: Diagnosis not present

## 2020-06-03 DIAGNOSIS — F0390 Unspecified dementia without behavioral disturbance: Secondary | ICD-10-CM

## 2020-06-03 DIAGNOSIS — M546 Pain in thoracic spine: Secondary | ICD-10-CM

## 2020-06-03 DIAGNOSIS — I10 Essential (primary) hypertension: Secondary | ICD-10-CM

## 2020-06-03 DIAGNOSIS — M419 Scoliosis, unspecified: Secondary | ICD-10-CM | POA: Insufficient documentation

## 2020-06-03 DIAGNOSIS — R69 Illness, unspecified: Secondary | ICD-10-CM | POA: Diagnosis not present

## 2020-06-03 MED ORDER — MIRTAZAPINE 7.5 MG PO TABS: 7.5000 mg | ORAL_TABLET | Freq: Every day | ORAL | 4 refills | Status: DC

## 2020-06-03 NOTE — Progress Notes (Signed)
BP 121/74   Pulse 75   Temp 98.3 F (36.8 C) (Oral)   Wt 166 lb 3.2 oz (75.4 kg)   SpO2 96%   BMI 24.54 kg/m    Subjective:    Patient ID: Sabri Azucena Fallen., male    DOB: 1928/04/28, 87 y.o.   MRN: [id number]  HPI: Benny W Veron Senner. is a 87 y.o. male  Chief Complaint  Patient presents with  . Chest Pain    Patient states he has been having minor difficulty breathing. Wednesday night stating he could not breathe and could not catch a good deep breathe and wife called 911. States his vitals were good except his blood pressure was reading high. States he went to see his daughter and he forgot to take his medications.  Alberteen Sam on Spine     Patient wife states they have noticed a spot on patient's back near his spine.     DEMENTIA: Is followed by neurology, Dr. Melrose Nakayama, last seen 02/08/20 -- to continue current medications. Continues on Aricept every day.  He eats three meals a day, but this is difficult due to underlying vision issues, dinner being the most difficult.  Weight in January 2020 was 176 and today 166, his wife reports he used to eat double helpings, but does not eat as much as once did. Not drinking Ensure, drank for a little while wife reports he stopped this as patient was concerned about the pot belly.  His wife reports he is more tired and sleeps more then used to -- is waking up more at night with some confusion. Wife reports his knees give out sometimes, tires easily with this.  He does not drink much water at home, this has been discussed multiple times.    Has macular degeneration -- last saw eye doctor 11/30/19 -- is doing visual rehab.  Has had 3 different prescriptions in the past year.    HYPERTENSION He is followed by cardiology for CAD and was last seen 02/05/20 by Dr. Nehemiah Massed, he is to stay of statin due to side effects. Echo did not LVEF 30-35% on 01/30/2020 and Myo noted hypokinesis of myocardial walls.   Recent episode of CP on 05/31/20 and breathing  issues, EMS came to home and per wife his BP was 180 over something and O2 sat 87%, EKG normal.  He declined ER at the time.  Wife reports he had forgotten BP medication a couple times prior.  Currently taking Losartan 25 MG QHS and ASA + Omeprazole 40 MG for GERD.  History of Covid 04/01/2020.  Today he denies chest pain, reports occasional shortness of breath, but thinks this is related to pollen.  Does report sometimes the scar area of his past heart surgery there is tenderness with deep breaths. Hypertension status: stable  Satisfied with current treatment? yes Duration of hypertension: chronic BP monitoring frequency:  not checking BP range:  BP medication side effects:  no Medication compliance: good compliance Previous BP meds: unknown Aspirin: yes Recurrent headaches: no Visual changes: no Palpitations: no Dyspnea: no Chest pain: no Lower extremity edema: no Dizzy/lightheaded: no  KNOT ON SPINE Had recent fall reported by wife, when in [place], and she reports noticing spot on back near spine.  One week ago fell out of bed, he reports no injury.  Wife reports they noticed a knot on back, but do not know how long has been present. Duration: weeks Location: mid back Painful: no Itching: no Onset: gradual  Context: not changing Associated signs and symptoms: none History of skin cancer: no History of precancerous skin lesions: no Family history of skin cancer: no  Relevant past medical, surgical, family and social history reviewed and updated as indicated. Interim medical history since our last visit reviewed. Allergies and medications reviewed and updated.  Review of Systems  Constitutional: Negative for activity change, diaphoresis, fatigue and fever.  Respiratory: Negative for cough, chest tightness, shortness of breath and wheezing.   Cardiovascular: Negative for chest pain, palpitations and leg swelling.  Gastrointestinal: Negative.   Neurological: Negative.    Psychiatric/Behavioral: Negative.     Per HPI unless specifically indicated above     Objective:    BP 121/74   Pulse 75   Temp 98.3 F (36.8 C) (Oral)   Wt 166 lb 3.2 oz (75.4 kg)   SpO2 96%   BMI 24.54 kg/m   Wt Readings from Last 3 Encounters:  06/03/20 166 lb 3.2 oz (75.4 kg)  12/22/19 169 lb (76.7 kg)  08/29/19 162 lb (73.5 kg)    Physical Exam Vitals and nursing note reviewed.  Constitutional:      General: He is awake. He is not in acute distress.    Appearance: He is well-developed, well-groomed and underweight. He is not ill-appearing or toxic-appearing.  HENT:     Head: Normocephalic and atraumatic.     Right Ear: Hearing normal. No drainage.     Left Ear: Hearing normal. No drainage.  Eyes:     General: Lids are normal.        Right eye: No discharge.        Left eye: No discharge.     Conjunctiva/sclera: Conjunctivae normal.     Pupils: Pupils are equal, round, and reactive to light.  Neck:     Thyroid: No thyromegaly.     Vascular: No carotid bruit.  Cardiovascular:     Rate and Rhythm: Normal rate and regular rhythm.     Heart sounds: Normal heart sounds, S1 normal and S2 normal. No murmur heard. No gallop.   Pulmonary:     Effort: Pulmonary effort is normal. No accessory muscle usage or respiratory distress.     Breath sounds: Normal breath sounds.  Abdominal:     General: Bowel sounds are normal. There is no distension.     Palpations: Abdomen is soft. There is no hepatomegaly or splenomegaly.     Tenderness: There is no abdominal tenderness. There is no right CVA tenderness or left CVA tenderness.     Hernia: No hernia is present.  Musculoskeletal:        General: Normal range of motion.     Cervical back: Normal range of motion and neck supple.     Thoracic back: Deformity (bony prominence to left aspect mid-thoracic spine, no pain with palpation) present. No swelling, edema, signs of trauma, lacerations, tenderness or bony tenderness.  Scoliosis present.     Right lower leg: No edema.     Left lower leg: No edema.     Comments: Significant scoliosis present, no rashes.  Skin:    General: Skin is warm and dry.     Capillary Refill: Capillary refill takes less than 2 seconds.          Comments: Scattered pale purple bruises to bilateral upper extremities.  Neurological:     Mental Status: He is alert.     Deep Tendon Reflexes: Reflexes are normal and symmetric.     Comments: Pleasantly  confused (dementia).  He is oriented to month and year + location, not day of week.  Psychiatric:        Attention and Perception: Attention normal.        Mood and Affect: Mood normal.        Speech: Speech normal.        Behavior: Behavior normal. Behavior is cooperative.    EKG My review and personal interpretation at Time: 1600    Indication: chest pain  Rate: 68 Rhythm: sinus Axis: normal Other: no lvh, on review T waves, possible anterolateral ischemia  Results for orders placed or performed in visit on 03/13/20  Novel Coronavirus, NAA ([facility])   Specimen: Nasopharyngeal(NP) swabs in vial transport medium  Result Value Ref Range   SARS-CoV-2, NAA Detected (A) Not Detected  SARS-COV-2, NAA 2 DAY TAT  Result Value Ref Range   SARS-CoV-2, NAA 2 DAY TAT Performed       Assessment & Plan:   Problem List Items Addressed This Visit      Cardiovascular and Mediastinum   Hypertension    Chronic, ongoing, followed by cardiology with recent visit.  Continue current medication regimen at this time, however discussed with his wife holding Losartan if BP <110/60 at home due to concern for orthostatic hypotension and falls in patient with dementia and advanced age + poor hydration.  Recommend increase fluid intake at home, getting at least 8 glasses of water a day.  CMP today.  Return in 4 weeks.        Nervous and Auditory   Dementia ([place]) - Primary    Chronic, progressive.  Discussed with his wife progressive nature of disease  process, continue Donepezil 10 MG.  Continue to collaborate with neurology.  Monitor closely for falls and PT referral for fall prevention and strengthening if increased falls present, he does not always participate with home PT.  A this time will add on a low dose Mirtazapine 7.5 MG QHS which may assist with appetite and sleep pattern + mood at HS.  Recommend 5 MG Melatonin for sleep aide as needed. Return in 4 weeks for follow-up.      Relevant Medications   mirtazapine (REMERON) 7.5 MG tablet     Other   Chest pain    Denies on exam today, EKG in office performed -- overall reassuring for age.  Recommend they schedule follow-up ASAP with Dr. Nehemiah Massed for further evaluation or if return of discomfort immediately go to ER.  At this time ?related to scar tissue in sternal area, as this is where he reports pain is with a deep breath at times.  He denies any CP, SOB, diaphoresis, arm pain today.  Will trial Tylenol daily to help with scar tissue discomfort.  Return in 4 weeks, sooner if worsening symptoms.      Relevant Orders   EKG 12-Lead (Completed)   Comprehensive metabolic panel   Acute midline thoracic back pain    No pain present, does have a small bony prominence with significant scoliosis present.  No bruises or rashes.  Will obtain imaging to further assess area and consider return of PT to house if increased falls present.      Relevant Orders   DG Thoracic Spine W/Swimmers       Follow up plan: Return in about 4 weeks (around 07/01/2020) for DEMENTIA WITH MIRTAZAPINE ADDED ON 06/03/20.

## 2020-06-03 NOTE — Assessment & Plan Note (Signed)
Chronic, ongoing, followed by cardiology with recent visit.  Continue current medication regimen at this time, however discussed with his wife holding Losartan if BP <110/60 at home due to concern for orthostatic hypotension and falls in patient with dementia and advanced age + poor hydration.  Recommend increase fluid intake at home, getting at least 8 glasses of water a day.  CMP today.  Return in 4 weeks.

## 2020-06-03 NOTE — Assessment & Plan Note (Signed)
Chronic, progressive.  Discussed with his wife progressive nature of disease process, continue Donepezil 10 MG.  Continue to collaborate with neurology.  Monitor closely for falls and PT referral for fall prevention and strengthening if increased falls present, he does not always participate with home PT.  A this time will add on a low dose Mirtazapine 7.5 MG QHS which may assist with appetite and sleep pattern + mood at HS.  Recommend 5 MG Melatonin for sleep aide as needed. Return in 4 weeks for follow-up.

## 2020-06-03 NOTE — Assessment & Plan Note (Signed)
No pain present, does have a small bony prominence with significant scoliosis present.  No bruises or rashes.  Will obtain imaging to further assess area and consider return of PT to house if increased falls present.

## 2020-06-03 NOTE — Assessment & Plan Note (Signed)
Denies on exam today, EKG in office performed -- overall reassuring for age.  Recommend they schedule follow-up ASAP with Dr. Nehemiah Massed for further evaluation or if return of discomfort immediately go to ER.  At this time ?related to scar tissue in sternal area, as this is where he reports pain is with a deep breath at times.  He denies any CP, SOB, diaphoresis, arm pain today.  Will trial Tylenol daily to help with scar tissue discomfort.  Return in 4 weeks, sooner if worsening symptoms.

## 2020-06-03 NOTE — Patient Instructions (Signed)

## 2020-06-04 ENCOUNTER — Ambulatory Visit
Admission: RE | Admit: 2020-06-04 | Discharge: 2020-06-04 | Disposition: A | Discharge status: Home / Self Care | Payer: Medicare HMO | Source: Ambulatory Visit | Attending: Nurse Practitioner | Admitting: Nurse Practitioner

## 2020-06-04 ENCOUNTER — Ambulatory Visit
Admission: RE | Admit: 2020-06-04 | Discharge: 2020-06-04 | Disposition: A | Discharge status: Home / Self Care | Payer: Medicare HMO | Attending: Nurse Practitioner | Admitting: Nurse Practitioner

## 2020-06-04 DIAGNOSIS — M4185 Other forms of scoliosis, thoracolumbar region: Secondary | ICD-10-CM | POA: Diagnosis not present

## 2020-06-04 DIAGNOSIS — M546 Pain in thoracic spine: Secondary | ICD-10-CM | POA: Insufficient documentation

## 2020-06-04 DIAGNOSIS — M47814 Spondylosis without myelopathy or radiculopathy, thoracic region: Secondary | ICD-10-CM | POA: Diagnosis not present

## 2020-06-04 DIAGNOSIS — Z981 Arthrodesis status: Secondary | ICD-10-CM | POA: Diagnosis not present

## 2020-06-04 DIAGNOSIS — I700 Atherosclerosis of aorta: Secondary | ICD-10-CM | POA: Diagnosis not present

## 2020-06-04 LAB — COMPREHENSIVE METABOLIC PANEL
ALT: 13 IU/L (ref 0–44)
AST: 14 IU/L (ref 0–40)
Albumin/Globulin Ratio: 1.5 (ref 1.2–2.2)
Albumin: 4.1 g/dL (ref 3.6–4.6)
Alkaline Phosphatase: 169 IU/L — ABNORMAL HIGH (ref 44–121)
BUN/Creatinine Ratio: 16 (ref 10–24)
BUN: 23 mg/dL (ref 8–27)
Bilirubin Total: 0.7 mg/dL (ref 0.0–1.2)
CO2: 17 mmol/L — ABNORMAL LOW (ref 20–29)
Calcium: 9.6 mg/dL (ref 8.6–10.2)
Chloride: 105 mmol/L (ref 96–106)
Creatinine, Ser: 1.42 mg/dL — ABNORMAL HIGH (ref 0.76–1.27)
Globulin, Total: 2.7 g/dL (ref 1.5–4.5)
Glucose: 67 mg/dL (ref 65–99)
Potassium: 5.3 mmol/L — ABNORMAL HIGH (ref 3.5–5.2)
Sodium: 141 mmol/L (ref 134–144)
Total Protein: 6.8 g/dL (ref 6.0–8.5)
eGFR: 47 mL/min/{1.73_m2} — ABNORMAL LOW (ref >59)

## 2020-06-04 NOTE — Progress Notes (Signed)
Please let Bradyn's wife know his labs have returned.  Kidney function continues to show stable kidney disease.  Potassium was mildly elevated, I would like to increase water intake at home and decrease foods high in potassium like bananas, potatoes, dried fruit, mangoes.  Will recheck next visit and if remains high may consider decreasing or discontinuing Losartan, as this can increase potassium.  Liver tests show normal AST and ALT.  Any questions? Keep being awesome!!  Thank you for allowing me to participate in your care. Kindest regards, Kaylob Wallen

## 2020-06-05 ENCOUNTER — Encounter: Payer: Self-pay | Admitting: Nurse Practitioner

## 2020-06-05 DIAGNOSIS — I700 Atherosclerosis of aorta: Secondary | ICD-10-CM | POA: Insufficient documentation

## 2020-06-05 NOTE — Progress Notes (Signed)
Good afternoon awesome part of our dynamic duo.  Can you please let Jason Cross's wife know his imaging returned and the area of concern is just prominence of his bone with his significant scoliosis, no abnormalities noted or fractures.  Thank you.

## 2020-06-10 DIAGNOSIS — R498 Other voice and resonance disorders: Secondary | ICD-10-CM | POA: Diagnosis not present

## 2020-06-10 DIAGNOSIS — R413 Other amnesia: Secondary | ICD-10-CM | POA: Diagnosis not present

## 2020-06-10 DIAGNOSIS — R350 Frequency of micturition: Secondary | ICD-10-CM | POA: Diagnosis not present

## 2020-06-10 DIAGNOSIS — R262 Difficulty in walking, not elsewhere classified: Secondary | ICD-10-CM | POA: Diagnosis not present

## 2020-06-10 DIAGNOSIS — R2689 Other abnormalities of gait and mobility: Secondary | ICD-10-CM | POA: Diagnosis not present

## 2020-06-10 DIAGNOSIS — R251 Tremor, unspecified: Secondary | ICD-10-CM | POA: Diagnosis not present

## 2020-06-10 DIAGNOSIS — R42 Dizziness and giddiness: Secondary | ICD-10-CM | POA: Diagnosis not present

## 2020-06-10 DIAGNOSIS — R8290 Unspecified abnormal findings in urine: Secondary | ICD-10-CM | POA: Diagnosis not present

## 2020-06-10 DIAGNOSIS — R300 Dysuria: Secondary | ICD-10-CM | POA: Diagnosis not present

## 2020-06-21 ENCOUNTER — Ambulatory Visit: Payer: Medicare HMO | Admitting: Nurse Practitioner

## 2020-06-22 ENCOUNTER — Encounter: Payer: Self-pay | Admitting: Nurse Practitioner

## 2020-06-22 DIAGNOSIS — D692 Other nonthrombocytopenic purpura: Secondary | ICD-10-CM | POA: Insufficient documentation

## 2020-06-28 ENCOUNTER — Ambulatory Visit (INDEPENDENT_AMBULATORY_CARE_PROVIDER_SITE_OTHER): Payer: Medicare HMO | Admitting: Nurse Practitioner

## 2020-06-28 ENCOUNTER — Encounter: Payer: Self-pay | Admitting: Nurse Practitioner

## 2020-06-28 ENCOUNTER — Other Ambulatory Visit: Payer: Self-pay

## 2020-06-28 VITALS — BP 99/64 | HR 71 | Temp 98.4°F | Wt 166.6 lb

## 2020-06-28 DIAGNOSIS — R69 Illness, unspecified: Secondary | ICD-10-CM | POA: Diagnosis not present

## 2020-06-28 DIAGNOSIS — Z9181 History of falling: Secondary | ICD-10-CM | POA: Insufficient documentation

## 2020-06-28 DIAGNOSIS — G20 Parkinson's disease: Secondary | ICD-10-CM

## 2020-06-28 DIAGNOSIS — I700 Atherosclerosis of aorta: Secondary | ICD-10-CM | POA: Diagnosis not present

## 2020-06-28 DIAGNOSIS — M4155 Other secondary scoliosis, thoracolumbar region: Secondary | ICD-10-CM | POA: Diagnosis not present

## 2020-06-28 DIAGNOSIS — I10 Essential (primary) hypertension: Secondary | ICD-10-CM

## 2020-06-28 DIAGNOSIS — F02818 Dementia in other diseases classified elsewhere, unspecified severity, with other behavioral disturbance: Secondary | ICD-10-CM

## 2020-06-28 DIAGNOSIS — F0281 Dementia in other diseases classified elsewhere with behavioral disturbance: Secondary | ICD-10-CM

## 2020-06-28 DIAGNOSIS — D692 Other nonthrombocytopenic purpura: Secondary | ICD-10-CM

## 2020-06-28 NOTE — Assessment & Plan Note (Signed)
Noted on imaging, patient with very leaned posture and shuffle gait.  Referral for home PT placed and recommend he use walker consistently at home.

## 2020-06-28 NOTE — Patient Instructions (Signed)
Start Melatonin 5-10 MG at night for sleep.   Parkinson's Disease Parkinson's disease causes problems with movements. It is a long-term condition. It gets worse over time (is progressive). It affects each person in different ways. It makes it harder for you to:  Control how your body moves.  Move your body normally. The condition can range from mild to very bad (advanced). What are the causes? This condition results from a loss of brain cells called neurons. These brain cells make a chemical called dopamine, which is needed to control body movement. As the condition gets worse, the brain cells make less dopamine. This makes it hard to move or control your movements. The exact cause of this condition is not known. What increases the risk?  Being male.  Being age 41 or older.  Having family members who had Parkinson's disease.  Having had an injury to the brain.  Being very sad (depressed).  Being around things that are harmful or poisonous. What are the signs or symptoms? Symptoms of this condition can vary. The main symptoms have to do with movement. These include:  A tremor or shaking while you are resting that you cannot control.  Stiffness in your neck, arms, and legs.  Slowing of movement. This may include: ? Losing expressions of the face. ? Having trouble making small movements that are needed to button your clothing or brush your teeth.  Walking in a way that is not normal. You may walk with short, shuffling steps.  Loss of balance when standing. You may sway, fall backward, or have trouble making turns. Other symptoms include:  Being very sad, worried, or confused.  Seeing or hearing things that are not real.  Losing thinking abilities (dementia).  Trouble speaking or swallowing.  Having a hard time pooping (constipation).  Needing to pee right away, peeing often, or not being able to control when you pee or poop.  Sleep problems.   How is this  treated? There is no cure. The goal of treatment is to manage your symptoms. Treatment may include:  Medicines.  Therapy to help with talking or movement.  Surgery to reduce shaking and other movements that you cannot control. Follow these instructions at home: Medicines  Take over-the-counter and prescription medicines only as told by your doctor.  Avoid taking pain or sleeping medicines. Eating and drinking  Follow instructions from your doctor about what you cannot eat or drink.  Do not drink alcohol. Activity  Talk with your doctor about if it is safe for you to drive.  Do exercises as told by your doctor. Lifestyle  Put in grab bars and railings in your home. These help to prevent falls.  Do not use any products that contain nicotine or tobacco, such as cigarettes, e-cigarettes, and chewing tobacco. If you need help quitting, ask your doctor.  Join a support group.      General instructions  Talk with your doctor about what you need help with and what your safety needs are.  Keep all follow-up visits as told by your doctor, including any therapy visits to help with talking or moving. This is important. Contact a doctor if:  Medicines do not help your symptoms.  You feel off-balance.  You fall at home.  You need more help at home.  You have trouble swallowing.  You have a very hard time pooping.  You have a lot of side effects from your medicines.  You feel very sad, worried, or confused. Get help  right away if:  You were hurt in a fall.  You see or hear things that are not real.  You cannot swallow without choking.  You have chest pain or trouble breathing.  You do not feel safe at home.  You have thoughts about hurting yourself or others. If you ever feel like you may hurt yourself or others, or have thoughts about taking your own life, get help right away. You can go to your nearest emergency department or call:  Your local emergency  services (911 in the [place]  A suicide crisis helpline, such as the [place] at 848-345-9770. This is open 24 hours a day. Summary  This condition causes problems with movements.  It is a long-term condition. It gets worse over time.  There is no cure. Treatment focuses on managing your symptoms.  Talk with your doctor about what you need help with and what your safety needs are.  Keep all follow-up visits as told by your doctor. This is important. This information is not intended to replace advice given to you by your health care provider. Make sure you discuss any questions you have with your health care provider. Document Revised: 04/28/2018 Document Reviewed: 04/28/2018 Elsevier Patient Education  [place].

## 2020-06-28 NOTE — Assessment & Plan Note (Signed)
Chronic, ongoing, followed by cardiology with recent visit.  Continue current medication regimen at this time, however discussed with his wife holding Losartan if BP <110/60 at home due to concern for orthostatic hypotension and falls in patient with dementia and advanced age + poor hydration.  Recommend increase fluid intake at home, getting at least 8 glasses of water a day.  CMP, CBC, TSH today.  Return in 5 weeks.

## 2020-06-28 NOTE — Assessment & Plan Note (Signed)
New diagnosis with neurology, is not tolerating Sinemet -- hallucinations since starting. Will check UA today to ensure no secondary cause.  Recommend patient's wife reach out to neurology to alert them of side effects presenting and hold medication at this time. Order for PT and palliative care placed, discussed with wife.  Return in 5 weeks.

## 2020-06-28 NOTE — Progress Notes (Signed)
BP 99/64   Pulse 71   Temp 98.4 F (36.9 C) (Oral)   Wt 166 lb 9.6 oz (75.6 kg)   SpO2 (!) 87%   BMI 24.60 kg/m    Subjective:    Patient ID: Jason Azucena Fallen., male    DOB: 03/28/1928, 87 y.o.   MRN: [id number]  HPI: Jason W Leovardo Thoman. is a 87 y.o. male  Chief Complaint  Patient presents with  . Dementia    Patient states he has a lot of questions for the provided and states the medication that was prescribed to patient at last visit at bedtime. Patient wife states she thinks it may be too strong and effecting balance. Patient states he can not walk a straight. Patient is still taking the medication.  . Tremors    Patient wife states since patient last visit he saw Dr.Potter's PA and was diagnosed with Parkinson's Disease.  . Night Terrors   Palliative and PT   DEMENTIA: Is followed by neurology, Dr. Melrose Nakayama, last seen 06/10/20 -- diagnosed with Parkinson's. Started on Sinemet 25/100 MG one tablet in the morning and then to increase to 1 tablet BID -- started hallucinating with this -- he discusses this today -- one episode where a strong force tried to take his hand away from door knob and there was mist around -- the force also kicks him out of bed.  Continue Gabapentin 300 MG in morning and 600 MG nightly. Continues on Aricept every day.  Recent urine culture negative with neurology in April.  He eats three meals a day, but this is difficult due to underlying vision issues, dinner being the most difficult.  Weight in January 2020 was 176 and today 166, his wife reports he used to eat double helpings, but does not eat as much as once did. Not drinking Ensure, drank for a little while wife reports he stopped this as patient was concerned about the pot belly.  His wife reports he is more tired and sleeps more then used to -- is waking up more at night with some confusion. Wife reports his knees give out sometimes, tires easily with this -- last fall was 2 weeks ago with skin  abrasion -- no LOC.  He does not drink much water at home, this has been discussed multiple times.  Started Mirtazapine last visit, but wife feels this makes him groggy.    Has macular degeneration -- last saw eye doctor 11/30/19 -- is doing visual rehab.  Has had 3 different prescriptions in the past year.    HYPERTENSION He is followed by cardiology for CAD and was last seen 02/05/20 by Dr. Nehemiah Massed, he is to stay off statin due to side effects. Echo did not LVEF 30-35% on 01/30/2020 and Myo noted hypokinesis of myocardial walls. Aortic atherosclerosis noted on x-ray imaging 06/05/20.  Currently taking Losartan 25 MG QHS and ASA + Omeprazole 40 MG for GERD.  History of Covid 04/01/2020.   Hypertension status: stable  Satisfied with current treatment? yes Duration of hypertension: chronic BP monitoring frequency:  not checking BP range:  BP medication side effects:  no Medication compliance: good compliance Previous BP meds: unknown Aspirin: yes Recurrent headaches: no Visual changes: no Palpitations: no Dyspnea: no Chest pain: no Lower extremity edema: no Dizzy/lightheaded: no  SCOLIOSIS Recent imaging noted thoracolumbar scoliosis and spondylosis.  Duration: weeks Location: mid back Painful: no Itching: no Onset: gradual Context: not changing Associated signs and symptoms: none History of skin  cancer: no History of precancerous skin lesions: no Family history of skin cancer: no  Relevant past medical, surgical, family and social history reviewed and updated as indicated. Interim medical history since our last visit reviewed. Allergies and medications reviewed and updated.  Review of Systems  Constitutional: Negative for activity change, diaphoresis, fatigue and fever.  Respiratory: Negative for cough, chest tightness, shortness of breath and wheezing.   Cardiovascular: Negative for chest pain, palpitations and leg swelling.  Gastrointestinal: Negative.   Neurological:  Negative.   Psychiatric/Behavioral: Negative.     Per HPI unless specifically indicated above     Objective:    BP 99/64   Pulse 71   Temp 98.4 F (36.9 C) (Oral)   Wt 166 lb 9.6 oz (75.6 kg)   SpO2 (!) 87%   BMI 24.60 kg/m   Wt Readings from Last 3 Encounters:  06/28/20 166 lb 9.6 oz (75.6 kg)  06/03/20 166 lb 3.2 oz (75.4 kg)  12/22/19 169 lb (76.7 kg)    Physical Exam Vitals and nursing note reviewed.  Constitutional:      General: He is awake. He is not in acute distress.    Appearance: He is well-developed, well-groomed and underweight. He is not ill-appearing or toxic-appearing.  HENT:     Head: Normocephalic and atraumatic.     Right Ear: Hearing normal. No drainage.     Left Ear: Hearing normal. No drainage.  Eyes:     General: Lids are normal.        Right eye: No discharge.        Left eye: No discharge.     Conjunctiva/sclera: Conjunctivae normal.     Pupils: Pupils are equal, round, and reactive to light.  Neck:     Thyroid: No thyromegaly.     Vascular: No carotid bruit.  Cardiovascular:     Rate and Rhythm: Normal rate and regular rhythm.     Heart sounds: Normal heart sounds, S1 normal and S2 normal. No murmur heard. No gallop.   Pulmonary:     Effort: Pulmonary effort is normal. No accessory muscle usage or respiratory distress.     Breath sounds: Normal breath sounds.  Abdominal:     General: Bowel sounds are normal. There is no distension.     Palpations: Abdomen is soft. There is no hepatomegaly or splenomegaly.     Tenderness: There is no abdominal tenderness. There is no right CVA tenderness or left CVA tenderness.     Hernia: No hernia is present.  Musculoskeletal:        General: Normal range of motion.     Cervical back: Normal range of motion and neck supple.     Thoracic back: Deformity (bony prominence to left aspect mid-thoracic spine, no pain with palpation) present. No swelling, edema, signs of trauma, lacerations, tenderness or  bony tenderness. Scoliosis present.     Right lower leg: No edema.     Left lower leg: No edema.     Comments: Significant scoliosis present, no rashes.  Skin:    General: Skin is warm and dry.     Capillary Refill: Capillary refill takes less than 2 seconds.     Comments: Scattered pale purple bruises to bilateral upper extremities.  Neurological:     Mental Status: He is alert.     Deep Tendon Reflexes: Reflexes are normal and symmetric.     Comments: Pleasantly confused (dementia).  He is oriented to month and year + location, not  day of week.  Mild cogwheel rigidity noted and shuffle gait with forward lean.  Flat affect.  Psychiatric:        Attention and Perception: Attention normal.        Mood and Affect: Mood normal.        Speech: Speech normal.        Behavior: Behavior normal. Behavior is cooperative.    Results for orders placed or performed in visit on 06/03/20  Comprehensive metabolic panel  Result Value Ref Range   Glucose 67 65 - 99 mg/dL   BUN 23 8 - 27 mg/dL   Creatinine, Ser 1.42 (H) 0.76 - 1.27 mg/dL   eGFR 47 (L) >59 mL/min/1.73   BUN/Creatinine Ratio 16 10 - 24   Sodium 141 134 - 144 mmol/L   Potassium 5.3 (H) 3.5 - 5.2 mmol/L   Chloride 105 96 - 106 mmol/L   CO2 17 (L) 20 - 29 mmol/L   Calcium 9.6 8.6 - 10.2 mg/dL   Total Protein 6.8 6.0 - 8.5 g/dL   Albumin 4.1 3.6 - 4.6 g/dL   Globulin, Total 2.7 1.5 - 4.5 g/dL   Albumin/Globulin Ratio 1.5 1.2 - 2.2   Bilirubin Total 0.7 0.0 - 1.2 mg/dL   Alkaline Phosphatase 169 (H) 44 - 121 IU/L   AST 14 0 - 40 IU/L   ALT 13 0 - 44 IU/L      Assessment & Plan:   Problem List Items Addressed This Visit      Cardiovascular and Mediastinum   Hypertension    Chronic, ongoing, followed by cardiology with recent visit.  Continue current medication regimen at this time, however discussed with his wife holding Losartan if BP <110/60 at home due to concern for orthostatic hypotension and falls in patient with dementia  and advanced age + poor hydration.  Recommend increase fluid intake at home, getting at least 8 glasses of water a day.  CMP, CBC, TSH today.  Return in 5 weeks.      Relevant Orders   Comprehensive metabolic panel   CBC with Differential/Platelet   TSH   Urinalysis, Routine w reflex microscopic   Aortic atherosclerosis ([place])    Noted on x-ray imaging 06/05/20 -- patient high fall risk with dementia and PD, to remain off statin per cardiology.  No ASA or statin at this time.      Senile purpura ([place])    Noted on exam, recommend gentle skin care at home and monitor closely for skin breakdown, alert immediately if presents.        Nervous and Auditory   Dementia ([place]) - Primary    Chronic, progressive.  Discussed with his wife progressive nature of disease process, continue Donepezil 10 MG.  Continue to collaborate with neurology.  Monitor closely for falls and PT referral for fall prevention and strengthening if increased falls present.  Palliative referral placed to assist wife and patient.  Stop Mirtazapine.  Recommend 5 MG Melatonin for sleep aide as needed. Return in 5 weeks for follow-up.      Relevant Medications   carbidopa-levodopa (SINEMET IR) 25-100 MG tablet   Other Relevant Orders   Vitamin B12   Amb Referral to Palliative Care   Ambulatory referral to Home Health   Parkinson disease [facility])    New diagnosis with neurology, is not tolerating Sinemet -- hallucinations since starting. Will check UA today to ensure no secondary cause.  Recommend patient's wife reach out to neurology to alert them  of side effects presenting and hold medication at this time. Order for PT and palliative care placed, discussed with wife.  Return in 5 weeks.      Relevant Medications   carbidopa-levodopa (SINEMET IR) 25-100 MG tablet   Other Relevant Orders   Amb Referral to Palliative Care   Ambulatory referral to [facility]     Musculoskeletal and Integument   Scoliosis of thoracolumbar  spine    Noted on imaging, patient with very leaned posture and shuffle gait.  Referral for home PT placed and recommend he use walker consistently at home.        Other   At high risk for falls    Physical therapy referral in place and recommend he consistently use walker at home.      Relevant Orders   Ambulatory referral to [facility]       Follow up plan: Return in about 5 weeks (around 08/02/2020) for DEMENTIA AND PARKINSON'S -- 40 minute please.

## 2020-06-28 NOTE — Assessment & Plan Note (Signed)
Physical therapy referral in place and recommend he consistently use walker at home.

## 2020-06-28 NOTE — Assessment & Plan Note (Signed)
Chronic, progressive.  Discussed with his wife progressive nature of disease process, continue Donepezil 10 MG.  Continue to collaborate with neurology.  Monitor closely for falls and PT referral for fall prevention and strengthening if increased falls present.  Palliative referral placed to assist wife and patient.  Stop Mirtazapine.  Recommend 5 MG Melatonin for sleep aide as needed. Return in 5 weeks for follow-up.

## 2020-06-28 NOTE — Assessment & Plan Note (Signed)
Noted on x-ray imaging 06/05/20 -- patient high fall risk with dementia and PD, to remain off statin per cardiology.  No ASA or statin at this time.

## 2020-06-28 NOTE — Assessment & Plan Note (Signed)
Noted on exam, recommend gentle skin care at home and monitor closely for skin breakdown, alert immediately if presents.

## 2020-06-29 LAB — CBC WITH DIFFERENTIAL/PLATELET
Basophils Absolute: 0.1 10*3/uL (ref 0.0–0.2)
Basos: 1 %
EOS (ABSOLUTE): 0.2 10*3/uL (ref 0.0–0.4)
Eos: 3 %
Hematocrit: 38 % (ref 37.5–51.0)
Hemoglobin: 12.3 g/dL — ABNORMAL LOW (ref 13.0–17.7)
Immature Grans (Abs): 0 10*3/uL (ref 0.0–0.1)
Immature Granulocytes: 0 %
Lymphocytes Absolute: 1.6 10*3/uL (ref 0.7–3.1)
Lymphs: 26 %
MCH: 28.4 pg (ref 26.6–33.0)
MCHC: 32.4 g/dL (ref 31.5–35.7)
MCV: 88 fL (ref 79–97)
Monocytes Absolute: 0.5 10*3/uL (ref 0.1–0.9)
Monocytes: 8 %
Neutrophils Absolute: 3.7 10*3/uL (ref 1.4–7.0)
Neutrophils: 62 %
Platelets: 254 10*3/uL (ref 150–450)
RBC: 4.33 x10E6/uL (ref 4.14–5.80)
RDW: 13.4 % (ref 11.6–15.4)
WBC: 6.1 10*3/uL (ref 3.4–10.8)

## 2020-06-29 LAB — COMPREHENSIVE METABOLIC PANEL
ALT: 13 IU/L (ref 0–44)
AST: 10 IU/L (ref 0–40)
Albumin/Globulin Ratio: 1.7 (ref 1.2–2.2)
Albumin: 4.1 g/dL (ref 3.6–4.6)
Alkaline Phosphatase: 122 IU/L — ABNORMAL HIGH (ref 44–121)
BUN/Creatinine Ratio: 15 (ref 10–24)
BUN: 24 mg/dL (ref 8–27)
Bilirubin Total: 0.8 mg/dL (ref 0.0–1.2)
CO2: 23 mmol/L (ref 20–29)
Calcium: 9.1 mg/dL (ref 8.6–10.2)
Chloride: 102 mmol/L (ref 96–106)
Creatinine, Ser: 1.62 mg/dL — ABNORMAL HIGH (ref 0.76–1.27)
Globulin, Total: 2.4 g/dL (ref 1.5–4.5)
Glucose: 102 mg/dL — ABNORMAL HIGH (ref 65–99)
Potassium: 5.1 mmol/L (ref 3.5–5.2)
Sodium: 139 mmol/L (ref 134–144)
Total Protein: 6.5 g/dL (ref 6.0–8.5)
eGFR: 40 mL/min/{1.73_m2} — ABNORMAL LOW (ref >59)

## 2020-06-29 LAB — URINALYSIS, ROUTINE W REFLEX MICROSCOPIC
Bilirubin, UA: NEGATIVE
Glucose, UA: NEGATIVE
Ketones, UA: NEGATIVE
Leukocytes,UA: NEGATIVE
Nitrite, UA: NEGATIVE
Protein,UA: NEGATIVE
RBC, UA: NEGATIVE
Specific Gravity, UA: 1.02 (ref 1.005–1.030)
Urobilinogen, Ur: 0.2 mg/dL (ref 0.2–1.0)
pH, UA: 5.5 (ref 5.0–7.5)

## 2020-06-29 LAB — VITAMIN B12: Vitamin B-12: 319 pg/mL (ref 232–1245)

## 2020-06-29 LAB — TSH: TSH: 1.12 u[IU]/mL (ref 0.450–4.500)

## 2020-06-29 NOTE — Progress Notes (Signed)
Good morning, please let Jason Cross's wife know labs have returned:  - Continues to show some stage 3 kidney disease, but no significant decline.  We will continue to monitor this closely.  Urine sample was normal.  Recommend trying to get him to increase water intake at home. - CBC is showing some decline in hemoglobin, may have some mild anemia.  His B12 level remains on lower side too.  I would recommend taking B12 1000 MCG daily, which is good for nervous system health too.  Will recheck this at next visit. - Thyroid is normal.  Any questions? Keep being awesome!!  Thank you for allowing me to participate in your care.  I appreciate you. Kindest regards, Tollie Canada

## 2020-07-01 ENCOUNTER — Telehealth: Payer: Self-pay | Admitting: Primary Care

## 2020-07-01 NOTE — Telephone Encounter (Signed)
Spoke with patient's wife, Jackelyn Poling, regarding the Palliative referral/services, she was aware of the referral and was in agreement with scheduling visit.  I have scheduled an In-home Consult for 07/16/20 @ 9 AM.

## 2020-07-08 ENCOUNTER — Telehealth: Payer: Self-pay | Admitting: Nurse Practitioner

## 2020-07-08 DIAGNOSIS — Z7982 Long term (current) use of aspirin: Secondary | ICD-10-CM | POA: Diagnosis not present

## 2020-07-08 DIAGNOSIS — I700 Atherosclerosis of aorta: Secondary | ICD-10-CM | POA: Diagnosis not present

## 2020-07-08 DIAGNOSIS — H9193 Unspecified hearing loss, bilateral: Secondary | ICD-10-CM | POA: Diagnosis not present

## 2020-07-08 DIAGNOSIS — N320 Bladder-neck obstruction: Secondary | ICD-10-CM | POA: Diagnosis not present

## 2020-07-08 DIAGNOSIS — G629 Polyneuropathy, unspecified: Secondary | ICD-10-CM | POA: Diagnosis not present

## 2020-07-08 DIAGNOSIS — I252 Old myocardial infarction: Secondary | ICD-10-CM | POA: Diagnosis not present

## 2020-07-08 DIAGNOSIS — G20 Parkinson's disease: Secondary | ICD-10-CM | POA: Diagnosis not present

## 2020-07-08 DIAGNOSIS — G4733 Obstructive sleep apnea (adult) (pediatric): Secondary | ICD-10-CM | POA: Diagnosis not present

## 2020-07-08 DIAGNOSIS — A4151 Sepsis due to Escherichia coli [E. coli]: Secondary | ICD-10-CM | POA: Diagnosis not present

## 2020-07-08 DIAGNOSIS — R69 Illness, unspecified: Secondary | ICD-10-CM | POA: Diagnosis not present

## 2020-07-08 DIAGNOSIS — M419 Scoliosis, unspecified: Secondary | ICD-10-CM | POA: Diagnosis not present

## 2020-07-08 DIAGNOSIS — N39498 Other specified urinary incontinence: Secondary | ICD-10-CM | POA: Diagnosis not present

## 2020-07-08 DIAGNOSIS — E785 Hyperlipidemia, unspecified: Secondary | ICD-10-CM | POA: Diagnosis not present

## 2020-07-08 DIAGNOSIS — K219 Gastro-esophageal reflux disease without esophagitis: Secondary | ICD-10-CM | POA: Diagnosis not present

## 2020-07-08 DIAGNOSIS — J449 Chronic obstructive pulmonary disease, unspecified: Secondary | ICD-10-CM | POA: Diagnosis not present

## 2020-07-08 DIAGNOSIS — I10 Essential (primary) hypertension: Secondary | ICD-10-CM | POA: Diagnosis not present

## 2020-07-08 DIAGNOSIS — M1990 Unspecified osteoarthritis, unspecified site: Secondary | ICD-10-CM | POA: Diagnosis not present

## 2020-07-08 DIAGNOSIS — G9341 Metabolic encephalopathy: Secondary | ICD-10-CM | POA: Diagnosis not present

## 2020-07-08 DIAGNOSIS — I2510 Atherosclerotic heart disease of native coronary artery without angina pectoris: Secondary | ICD-10-CM | POA: Diagnosis not present

## 2020-07-08 DIAGNOSIS — N401 Enlarged prostate with lower urinary tract symptoms: Secondary | ICD-10-CM | POA: Diagnosis not present

## 2020-07-08 DIAGNOSIS — Z9181 History of falling: Secondary | ICD-10-CM | POA: Diagnosis not present

## 2020-07-08 DIAGNOSIS — H3530 Unspecified macular degeneration: Secondary | ICD-10-CM | POA: Diagnosis not present

## 2020-07-08 DIAGNOSIS — R350 Frequency of micturition: Secondary | ICD-10-CM | POA: Diagnosis not present

## 2020-07-08 DIAGNOSIS — N3000 Acute cystitis without hematuria: Secondary | ICD-10-CM | POA: Diagnosis not present

## 2020-07-08 DIAGNOSIS — E876 Hypokalemia: Secondary | ICD-10-CM | POA: Diagnosis not present

## 2020-07-08 NOTE — Telephone Encounter (Signed)
Home Health Verbal Orders - Caller/Agency: [facility] Callback Number: [phone] Requesting OT/PT/Skilled Nursing/Social Work/Speech Therapy: PT  Frequency: 2w3 1w6

## 2020-07-09 NOTE — Telephone Encounter (Signed)
Called and gave verbal orders per provider.  

## 2020-07-09 NOTE — Telephone Encounter (Signed)
Routing to provider  

## 2020-07-09 NOTE — Telephone Encounter (Signed)
I am okay with verbal orders, please provider to them:)

## 2020-07-11 ENCOUNTER — Telehealth: Payer: Self-pay | Admitting: Licensed Clinical Social Worker

## 2020-07-11 ENCOUNTER — Telehealth: Payer: Self-pay

## 2020-07-11 NOTE — Telephone Encounter (Signed)
    Clinical Social Work  Care Management   Phone Outreach    07/11/2020 Name: Jason Cross. MRN: [id number] DOB: 04/14/1928  Jason Cross. is a 87 y.o. year old male who is a primary care patient of Cannady, Barbaraann Faster, NP .   CCM LCSW reached out to patient today by phone to introduce self, assess needs and offer Care Management services and interventions.    Telephone outreach was unsuccessful  Plan:CCM LCSW will wait for return call. If no return call is received, Will route chart to Care Guide to see if patient would like to reschedule phone appointment   Review of patient status, including review of consultants reports, relevant laboratory and other test results, and collaboration with appropriate care team members and the patient's provider was performed as part of comprehensive patient evaluation and provision of care management services.    Christa See, MSW, [email or web address] Phone [phone] 4:24 PM

## 2020-07-12 DIAGNOSIS — H3530 Unspecified macular degeneration: Secondary | ICD-10-CM | POA: Diagnosis not present

## 2020-07-12 DIAGNOSIS — I700 Atherosclerosis of aorta: Secondary | ICD-10-CM | POA: Diagnosis not present

## 2020-07-12 DIAGNOSIS — I2510 Atherosclerotic heart disease of native coronary artery without angina pectoris: Secondary | ICD-10-CM | POA: Diagnosis not present

## 2020-07-12 DIAGNOSIS — R69 Illness, unspecified: Secondary | ICD-10-CM | POA: Diagnosis not present

## 2020-07-12 DIAGNOSIS — Z9181 History of falling: Secondary | ICD-10-CM | POA: Diagnosis not present

## 2020-07-12 DIAGNOSIS — G20 Parkinson's disease: Secondary | ICD-10-CM | POA: Diagnosis not present

## 2020-07-12 DIAGNOSIS — M419 Scoliosis, unspecified: Secondary | ICD-10-CM | POA: Diagnosis not present

## 2020-07-12 DIAGNOSIS — H9193 Unspecified hearing loss, bilateral: Secondary | ICD-10-CM | POA: Diagnosis not present

## 2020-07-12 DIAGNOSIS — I10 Essential (primary) hypertension: Secondary | ICD-10-CM | POA: Diagnosis not present

## 2020-07-16 ENCOUNTER — Other Ambulatory Visit: Payer: Self-pay

## 2020-07-16 ENCOUNTER — Other Ambulatory Visit: Payer: Medicare HMO | Admitting: Primary Care

## 2020-07-16 DIAGNOSIS — Z515 Encounter for palliative care: Secondary | ICD-10-CM

## 2020-07-16 DIAGNOSIS — I10 Essential (primary) hypertension: Secondary | ICD-10-CM | POA: Diagnosis not present

## 2020-07-16 DIAGNOSIS — G20 Parkinson's disease: Secondary | ICD-10-CM | POA: Diagnosis not present

## 2020-07-16 DIAGNOSIS — G308 Other Alzheimer's disease: Secondary | ICD-10-CM | POA: Diagnosis not present

## 2020-07-16 DIAGNOSIS — F0280 Dementia in other diseases classified elsewhere without behavioral disturbance: Secondary | ICD-10-CM

## 2020-07-16 DIAGNOSIS — H9193 Unspecified hearing loss, bilateral: Secondary | ICD-10-CM | POA: Diagnosis not present

## 2020-07-16 DIAGNOSIS — M419 Scoliosis, unspecified: Secondary | ICD-10-CM | POA: Diagnosis not present

## 2020-07-16 DIAGNOSIS — R69 Illness, unspecified: Secondary | ICD-10-CM | POA: Diagnosis not present

## 2020-07-16 DIAGNOSIS — Z9181 History of falling: Secondary | ICD-10-CM | POA: Diagnosis not present

## 2020-07-16 DIAGNOSIS — H3530 Unspecified macular degeneration: Secondary | ICD-10-CM | POA: Diagnosis not present

## 2020-07-16 DIAGNOSIS — I2510 Atherosclerotic heart disease of native coronary artery without angina pectoris: Secondary | ICD-10-CM | POA: Diagnosis not present

## 2020-07-16 DIAGNOSIS — I700 Atherosclerosis of aorta: Secondary | ICD-10-CM | POA: Diagnosis not present

## 2020-07-16 NOTE — Progress Notes (Signed)
[facility] Consult Note Telephone: [phone]  Fax: [phone]    Date of encounter: 07/16/20 PATIENT NAME: Jason Cross. [place] [address]   [phone] (home)  DOB: 1929/01/29 MRN: [id number] PRIMARY CARE PROVIDER:    Venita Lick, NP,  [place] [address] [phone]  REFERRING PROVIDER:   Venita Lick, NP [address] [place],  [place] [address] [phone]  RESPONSIBLE PARTY:    Contact Information    Name Relation Home Work [place] Spouse [phone]  [phone]       I met face to face with patient and family in  home. Palliative Care was asked to follow this patient by consultation request of  Venita Lick, NP to address advance care planning and complex medical decision making. This is the initial visit.                                     ASSESSMENT AND PLAN / RECOMMENDATIONS:   Advance Care Planning/Goals of Care: Goals include to maximize quality of life and symptom management. Our advance care planning conversation included a discussion about:     The value and importance of advance care planning   Exploration of personal, cultural or spiritual beliefs that might influence medical decisions   Exploration of goals of care in the event of a sudden injury or illness   Identification of a healthcare agent - chooses wife  Review of advance directive document .  CODE STATUS: FULL    Advance care planning we discussed advance plan discussion or Living Will. This is not been done. Patient and wife do not have a last will, Living Will or powers of attorney decided.  I encouraged them to do this as soon as possible very soon, patient was able to state he wanted his wife to make his decisions and I do believe he has capacity but this could change quickly. I encouraged wife that it would help with future decisions and would clarify future legal needs  if they were to do this at the earliest possible time.   We also discussed the MOST  form in light of them not having any other advanced directives. We discussed the decisions there in. Since they have not made these decisions before they wanted some time to consider and discuss with other family members. This is there a second marriage and both have children from a previous marriages.  Symptom Management/Plan:  Veteran: [employer]. No VA benefits. Was active in [phone]. 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 Not [place]. Has some nightmares.  Eyesight: macular degeneration  refused injections and has worsened.[facility] but decided not to pursue injections.  Mobility: Uses cane and walker, has standard and rollator. Reported shuffle gait. Has begun PT with [facility].   Caregiver Strain: household has pt and wife, son and granddaughter. Can bathe and dress self with assistance. Can be left  for 15 min. But longer visits takes pt with . Has a cousin who helps with some care.   Nocturia: Gets up 3-4 x q hs. Has urology appt next month. Does not drink enough fluids. We reviewed and he drinks < 16 oz daily of water. This may be leading to concentrated and odorous urine.   Follow up Palliative Care Visit: Palliative care will continue to follow for  complex medical decision making, advance care planning, and clarification of goals. Return 4-6 weeks or prn.  I spent 75 minutes providing this consultation. More than 50% of the time in this consultation was spent in counseling and care coordination.  PPS: 40%  HOSPICE ELIGIBILITY/DIAGNOSIS: TBD  Chief Complaint: mobility difficulty.  HISTORY OF PRESENT ILLNESS:  Jason W Jason Cross. is a 87 y.o. year old male  with AD, gait abnormality, CAD, COPD .   History obtained from review of EMR, discussion with primary team, and interview with family, facility staff/caregiver and/or Jason Cross.  I reviewed available labs, medications, imaging, studies and  related documents from the EMR.  Records reviewed and summarized above.   ROS  General: NAD EYES: endorses vision changes ENMT: denies dysphagia Cardiovascular: denies chest pain, denies DOE Pulmonary: denies cough, denies increased SOB Abdomen: endorses good appetite, denies constipation, endorses continence of bowel GU: denies dysuria, endorses continence of urine MSK: endorses  weakness,  no falls reported Skin: denies rashes or wounds Neurological:endorses general OA  pain,endorses  insomnia Psych: Endorses positive mood Heme/lymph/immuno: denies bruises, abnormal bleeding  Physical Exam: Current and past weights: 166 lbs Constitutional: NAD General: frail appearing, WNWD EYES: anicteric sclera, lids intact, no discharge  ENMT: hard of  hearing, oral mucous membranes dry CV: S1S2, RRR, no LE edema Pulmonary: LCTA, no increased work of breathing, no cough, room air Abdomen: intake 75%, no ascites MSK: mod sarcopenia, moves all extremities, ambulatory with device and stand by Skin: warm and dry, no rashes or wounds on visible skin Neuro:  + generalized weakness,  Advanced cognitive impairment Psych: non-anxious affect, A and O x 2 Hem/lymph/immuno: no widespread bruising, Covid infection 1/22.   CURRENT PROBLEM LIST:  Patient Active Problem List   Diagnosis Date Noted  . At high risk for falls 06/28/2020  . Senile purpura ([place]) 06/22/2020  . Aortic atherosclerosis ([place]) 06/05/2020  . Scoliosis of thoracolumbar spine 06/03/2020  . History of 2019 novel coronavirus disease (COVID-19) 03/13/2020  . CKD (chronic kidney disease) stage 3, GFR 30-59 ml/min (HCC) 11/26/2019  . Difficulty walking 08/04/2017  . Expressive aphasia 08/04/2017  . Parkinson disease ([place]) 08/04/2017  . Advanced care planning/counseling discussion 07/21/2016  . GERD (gastroesophageal reflux disease) 12/30/2015  . Peripheral neuropathy 07/17/2015  . Dementia ([place]) 07/17/2015  . Orthostatic  hypotension 07/17/2015  . Hypertension 07/17/2015  . Coronary artery disease involving coronary bypass graft of native heart without angina pectoris 07/17/2015  . Urinary retention with incomplete bladder emptying 03/20/2013  . Constipation due to slow transit 03/20/2013  . Lumbar spondylolysis 03/14/2013  . Lumbar scoliosis 03/10/2013   PAST MEDICAL HISTORY:  Active Ambulatory Problems    Diagnosis Date Noted  . Lumbar scoliosis 03/10/2013  . Lumbar spondylolysis 03/14/2013  . Urinary retention with incomplete bladder emptying 03/20/2013  . Constipation due to slow transit 03/20/2013  . Peripheral neuropathy 07/17/2015  . Dementia ([place]) 07/17/2015  . Orthostatic hypotension 07/17/2015  . Hypertension 07/17/2015  . Coronary artery disease involving coronary bypass graft of native heart without angina pectoris 07/17/2015  . GERD (gastroesophageal reflux disease) 12/30/2015  . Advanced care planning/counseling discussion 07/21/2016  . Difficulty walking 08/04/2017  . Expressive aphasia 08/04/2017  . Parkinson disease ([place]) 08/04/2017  . CKD (chronic kidney disease) stage 3, GFR 30-59 ml/min (HCC) 11/26/2019  . History of 2019 novel coronavirus disease (COVID-19) 03/13/2020  . Scoliosis of thoracolumbar spine 06/03/2020  . Aortic atherosclerosis ([place]) 06/05/2020  . Senile purpura ([place]) 06/22/2020  .  At high risk for falls 06/28/2020   Resolved Ambulatory Problems    Diagnosis Date Noted  . Renal failure, acute on chronic (HCC) 03/20/2013  . Hoarseness 07/28/2017  . Dizziness 08/04/2017  . Drooling 08/04/2017  . Loss of memory 08/04/2017  . Dark urine 06/26/2019  . Chest pain 06/03/2020   Past Medical History:  Diagnosis Date  . Arthritis   . Benign prostate hyperplasia   . Cataracts, bilateral   . Coarse tremors   . COPD (chronic obstructive pulmonary disease) ([place])   . Coronary artery disease   . Gastric ulcer   . Hard of hearing   . History of colon polyps   .  History of kidney stones   . Hyperlipidemia   . Macular degeneration   . Myocardial infarction ([employer]) 2009  . Pneumonia   . Sleep apnea    SOCIAL HX:  Social History   Tobacco Use  . Smoking status: Former Smoker    Years: 40.00    Types: Cigarettes    Quit date: 02/23/1993    Years since quitting: 27.4  . Smokeless tobacco: Never Used  Substance Use Topics  . Alcohol use: No   FAMILY HX:  Family History  Problem Relation Age of Onset  . Heart disease Father   . Hypertension Father   . Heart attack Father   . Thyroid disease Father   . Cancer Sister        breast  . Breast cancer Sister       ALLERGIES:  Allergies  Allergen Reactions  . Contrast Media [Iodinated Diagnostic Agents] Hives, Itching and Rash  . Codeine Nausea And Vomiting     PERTINENT MEDICATIONS:  Outpatient Encounter Medications as of 07/16/2020  Medication Sig  . acetaminophen (TYLENOL) 500 MG tablet Take 500 mg by mouth every 6 (six) hours as needed.  Marland Kitchen aspirin EC 81 MG tablet Take 81 mg by mouth every morning.  . carbidopa-levodopa (SINEMET IR) 25-100 MG tablet Take 1 tablet in the morning for one week, then increase to 1 tablet in the morning and 1 tablet in the afternoon.  . donepezil (ARICEPT) 10 MG tablet Take 1 tablet (10 mg total) by mouth at bedtime.  . gabapentin (NEURONTIN) 300 MG capsule Take 1 capsule by mouth 3 (three) times daily.  Marland Kitchen losartan (COZAAR) 25 MG tablet Take 25 mg by mouth daily.  . Multiple Vitamins-Minerals (PRESERVISION AREDS 2 PO) Take 2 tablets by mouth daily.  Marland Kitchen omeprazole (PRILOSEC) 40 MG capsule Take 40 mg by mouth daily.   No facility-administered encounter medications on file as of 07/16/2020.    Thank you for the opportunity to participate in the care of Mr. Harn.  The palliative care team will continue to follow. Please call our office at [phone] if we can be of additional assistance.   Jason Coop, NP , DNP, MPH, AGPCNP-BC, ACHPN  COVID-19  PATIENT SCREENING TOOL Asked and negative response unless otherwise noted:   Have you had symptoms of covid, tested positive or been in contact with someone with symptoms/positive test in the past 5-10 days?

## 2020-07-16 NOTE — Telephone Encounter (Signed)
Patient has been rescheduled.

## 2020-07-18 ENCOUNTER — Emergency Department: Payer: Medicare HMO

## 2020-07-18 ENCOUNTER — Other Ambulatory Visit: Payer: Self-pay

## 2020-07-18 ENCOUNTER — Emergency Department
Admission: EM | Admit: 2020-07-18 | Discharge: 2020-07-18 | Disposition: A | Discharge status: Home / Self Care | Payer: Medicare HMO | Attending: Emergency Medicine | Admitting: Emergency Medicine

## 2020-07-18 DIAGNOSIS — Z87891 Personal history of nicotine dependence: Secondary | ICD-10-CM | POA: Diagnosis not present

## 2020-07-18 DIAGNOSIS — F0390 Unspecified dementia without behavioral disturbance: Secondary | ICD-10-CM | POA: Insufficient documentation

## 2020-07-18 DIAGNOSIS — Z951 Presence of aortocoronary bypass graft: Secondary | ICD-10-CM | POA: Diagnosis not present

## 2020-07-18 DIAGNOSIS — G20 Parkinson's disease: Secondary | ICD-10-CM | POA: Insufficient documentation

## 2020-07-18 DIAGNOSIS — J449 Chronic obstructive pulmonary disease, unspecified: Secondary | ICD-10-CM | POA: Insufficient documentation

## 2020-07-18 DIAGNOSIS — I2510 Atherosclerotic heart disease of native coronary artery without angina pectoris: Secondary | ICD-10-CM | POA: Diagnosis not present

## 2020-07-18 DIAGNOSIS — S79912A Unspecified injury of left hip, initial encounter: Secondary | ICD-10-CM | POA: Diagnosis not present

## 2020-07-18 DIAGNOSIS — N183 Chronic kidney disease, stage 3 unspecified: Secondary | ICD-10-CM | POA: Diagnosis not present

## 2020-07-18 DIAGNOSIS — I129 Hypertensive chronic kidney disease with stage 1 through stage 4 chronic kidney disease, or unspecified chronic kidney disease: Secondary | ICD-10-CM | POA: Diagnosis not present

## 2020-07-18 DIAGNOSIS — W06XXXA Fall from bed, initial encounter: Secondary | ICD-10-CM | POA: Insufficient documentation

## 2020-07-18 DIAGNOSIS — M25552 Pain in left hip: Secondary | ICD-10-CM | POA: Diagnosis not present

## 2020-07-18 DIAGNOSIS — Z7982 Long term (current) use of aspirin: Secondary | ICD-10-CM | POA: Insufficient documentation

## 2020-07-18 DIAGNOSIS — R69 Illness, unspecified: Secondary | ICD-10-CM | POA: Diagnosis not present

## 2020-07-18 DIAGNOSIS — S7002XA Contusion of left hip, initial encounter: Secondary | ICD-10-CM | POA: Diagnosis not present

## 2020-07-18 DIAGNOSIS — R5381 Other malaise: Secondary | ICD-10-CM | POA: Diagnosis not present

## 2020-07-18 DIAGNOSIS — R531 Weakness: Secondary | ICD-10-CM | POA: Diagnosis not present

## 2020-07-18 DIAGNOSIS — W19XXXA Unspecified fall, initial encounter: Secondary | ICD-10-CM

## 2020-07-18 DIAGNOSIS — Z8616 Personal history of COVID-19: Secondary | ICD-10-CM | POA: Insufficient documentation

## 2020-07-18 DIAGNOSIS — Z79899 Other long term (current) drug therapy: Secondary | ICD-10-CM | POA: Diagnosis not present

## 2020-07-18 DIAGNOSIS — R001 Bradycardia, unspecified: Secondary | ICD-10-CM | POA: Diagnosis not present

## 2020-07-18 LAB — COMPREHENSIVE METABOLIC PANEL
ALT: 11 U/L (ref 0–44)
AST: 18 U/L (ref 15–41)
Albumin: 3.6 g/dL (ref 3.5–5.0)
Alkaline Phosphatase: 89 U/L (ref 38–126)
Anion gap: 6 (ref 5–15)
BUN: 23 mg/dL (ref 8–23)
CO2: 24 mmol/L (ref 22–32)
Calcium: 9 mg/dL (ref 8.9–10.3)
Chloride: 110 mmol/L (ref 98–111)
Creatinine, Ser: 1.5 mg/dL — ABNORMAL HIGH (ref 0.61–1.24)
GFR, Estimated: 44 mL/min — ABNORMAL LOW (ref >60)
Glucose, Bld: 95 mg/dL (ref 70–99)
Potassium: 4.4 mmol/L (ref 3.5–5.1)
Sodium: 140 mmol/L (ref 135–145)
Total Bilirubin: 0.9 mg/dL (ref 0.3–1.2)
Total Protein: 6.6 g/dL (ref 6.5–8.1)

## 2020-07-18 LAB — URINALYSIS, COMPLETE (UACMP) WITH MICROSCOPIC
Bacteria, UA: NONE SEEN
Bilirubin Urine: NEGATIVE
Glucose, UA: NEGATIVE mg/dL
Hgb urine dipstick: NEGATIVE
Ketones, ur: NEGATIVE mg/dL
Leukocytes,Ua: NEGATIVE
Nitrite: NEGATIVE
Protein, ur: NEGATIVE mg/dL
Specific Gravity, Urine: 1.017 (ref 1.005–1.030)
Squamous Epithelial / HPF: NONE SEEN (ref 0–5)
pH: 5 (ref 5.0–8.0)

## 2020-07-18 LAB — CBC WITH DIFFERENTIAL/PLATELET
Abs Immature Granulocytes: 0.01 10*3/uL (ref 0.00–0.07)
Basophils Absolute: 0.1 10*3/uL (ref 0.0–0.1)
Basophils Relative: 1 %
Eosinophils Absolute: 0.2 10*3/uL (ref 0.0–0.5)
Eosinophils Relative: 3 %
HCT: 38.9 % — ABNORMAL LOW (ref 39.0–52.0)
Hemoglobin: 12.4 g/dL — ABNORMAL LOW (ref 13.0–17.0)
Immature Granulocytes: 0 %
Lymphocytes Relative: 27 %
Lymphs Abs: 1.7 10*3/uL (ref 0.7–4.0)
MCH: 28.4 pg (ref 26.0–34.0)
MCHC: 31.9 g/dL (ref 30.0–36.0)
MCV: 89.2 fL (ref 80.0–100.0)
Monocytes Absolute: 0.5 10*3/uL (ref 0.1–1.0)
Monocytes Relative: 8 %
Neutro Abs: 3.9 10*3/uL (ref 1.7–7.7)
Neutrophils Relative %: 61 %
Platelets: 190 10*3/uL (ref 150–400)
RBC: 4.36 MIL/uL (ref 4.22–5.81)
RDW: 14.5 % (ref 11.5–15.5)
WBC: 6.5 10*3/uL (ref 4.0–10.5)
nRBC: 0 % (ref 0.0–0.2)

## 2020-07-18 NOTE — Discharge Instructions (Addendum)
Follow-up with your primary care provider if any continued problems or concerns.  Return to the emergency department if any severe worsening of his symptoms or urgent concerns over the holiday weekend.  Continue with his regular medication with the exception of the carbidopa-levodopa until you are able to talk to your primary care provider.

## 2020-07-18 NOTE — ED Notes (Signed)
See triage note  Presents s/p fall  States he was trying to get to the foot of the bed  Slipped out of bed  Landed on left hip  States pain is inermittent

## 2020-07-18 NOTE — ED Notes (Signed)
Explained wait to pt and family

## 2020-07-18 NOTE — ED Triage Notes (Signed)
Pt to ED [facility] from home for fall off bed while trying to get up. States fell on left hip. Denies hitting head.  Hx dementia and parkinsons Pt in NAD No shortening or rotation noted to left leg

## 2020-07-18 NOTE — ED Provider Notes (Signed)
[facility] Emergency Department Provider Note  ____________________________________________   Event Date/Time   First MD Initiated Contact with Patient 07/18/20 1031     (approximate)  I have reviewed the triage vital signs and the nursing notes.   HISTORY  Chief Complaint Fall   HPI Jason W Salathiel Ferrara. is a 87 y.o. male is brought to the ED via EMS from home after he fell off the bed while trying to get up.  Wife states that he did not hit his head and there was no loss of consciousness.  Patient has a history of dementia and Parkinson's.  Wife is concerned because he has been taking his Parkinson's medication which "makes him feel funny".  She states that his PCP had told him to discontinue taking it however home health nurses states that this medication would not cause any complications or increased confusion and that he should continue taking it.  Wife states that she began giving it once again yesterday and this morning he woke up with the same symptoms.  Patient complains of his left hip hurting.      Past Medical History:  Diagnosis Date  . Arthritis   . Benign prostate hyperplasia   . Cataracts, bilateral   . Coarse tremors    L>R hands  . COPD (chronic obstructive pulmonary disease) ([place])    first stages  . Coronary artery disease   . Dizziness    occasional  . Gastric ulcer    in the 60's  . GERD (gastroesophageal reflux disease)    takes Omeprazole every other day  . Hard of hearing    doesn't wear hearing aids  . History of colon polyps   . History of kidney stones    still one on the left side, low  . Hyperlipidemia    takes Simvastatin daily  . Hypertension    stopped BP meds, BP under control  . Macular degeneration   . Myocardial infarction ([employer]) 2009  . Peripheral neuropathy    takes Gabapentin daily  . Pneumonia    hx of in the 60's  . Sleep apnea     Patient Active Problem List   Diagnosis Date Noted  . At high  risk for falls 06/28/2020  . Senile purpura ([place]) 06/22/2020  . Aortic atherosclerosis ([place]) 06/05/2020  . Scoliosis of thoracolumbar spine 06/03/2020  . History of 2019 novel coronavirus disease (COVID-19) 03/13/2020  . CKD (chronic kidney disease) stage 3, GFR 30-59 ml/min (HCC) 11/26/2019  . Difficulty walking 08/04/2017  . Expressive aphasia 08/04/2017  . Parkinson disease ([place]) 08/04/2017  . Advanced care planning/counseling discussion 07/21/2016  . GERD (gastroesophageal reflux disease) 12/30/2015  . Peripheral neuropathy 07/17/2015  . Dementia ([place]) 07/17/2015  . Orthostatic hypotension 07/17/2015  . Hypertension 07/17/2015  . Coronary artery disease involving coronary bypass graft of native heart without angina pectoris 07/17/2015  . Urinary retention with incomplete bladder emptying 03/20/2013  . Constipation due to slow transit 03/20/2013  . Lumbar spondylolysis 03/14/2013  . Lumbar scoliosis 03/10/2013    Past Surgical History:  Procedure Laterality Date  . BACK SURGERY  2007  . COLONOSCOPY    . CORONARY ARTERY BYPASS GRAFT  2009   LIMA to LAD, SVG to D1 07/2007  . EYE SURGERY Bilateral 2004, 2005   implants  . GREEN LIGHT LASER TURP (TRANSURETHRAL RESECTION OF PROSTATE N/A 05/10/2013   Procedure: GREEN LIGHT LASER TURP (TRANSURETHRAL RESECTION OF PROSTATE;  Surgeon: Ardis Hughs, MD;  Location: [facility];  Service: Urology;  Laterality: N/A;  . lipoma removed     chest wall  . microthermo prostate  2009  . [place]  . PROSTATE SURGERY  10/14  . THORACIC DISCECTOMY N/A 03/10/2013   Procedure: Thoracic twelve-Lumbar one Laminectomy;  Surgeon: Erline Levine, MD;  Location: [facility];  Service: Neurosurgery;  Laterality: N/A;  Thoracic twelve-Lumbar one Laminectomy  . TONSILLECTOMY     at age 80    Prior to Admission medications   Medication Sig Start Date End Date Taking? Authorizing Provider  acetaminophen (TYLENOL) 500 MG tablet Take 500 mg by mouth  every 6 (six) hours as needed.    [provider]  aspirin EC 81 MG tablet Take 81 mg by mouth every morning.    [provider]  carbidopa-levodopa (SINEMET IR) 25-100 MG tablet Take 1 tablet in the morning for one week, then increase to 1 tablet in the morning and 1 tablet in the afternoon. Patient not taking: Reported on 07/16/2020 06/10/20   [provider]  donepezil (ARICEPT) 10 MG tablet Take 1 tablet (10 mg total) by mouth at bedtime. 06/26/19   Cannady, Henrine Screws T, NP  gabapentin (NEURONTIN) 300 MG capsule Take 1 capsule by mouth 3 (three) times daily. 06/12/19   [provider]  losartan (COZAAR) 25 MG tablet Take 25 mg by mouth daily. 06/03/19   [provider]  mirtazapine (REMERON) 7.5 MG tablet Take 7.5 mg by mouth at bedtime. Patient not taking: Reported on 07/16/2020    [provider]  Multiple Vitamins-Minerals (PRESERVISION AREDS 2 PO) Take 2 tablets by mouth daily.    [provider]  omeprazole (PRILOSEC) 40 MG capsule Take 40 mg by mouth daily. 08/28/19   [provider]    Allergies Contrast media [iodinated diagnostic agents] and Codeine  Family History  Problem Relation Age of Onset  . Heart disease Father   . Hypertension Father   . Heart attack Father   . Thyroid disease Father   . Cancer Sister        breast  . Breast cancer Sister     Social History Social History   Tobacco Use  . Smoking status: Former Smoker    Years: 40.00    Types: Cigarettes    Quit date: 02/23/1993    Years since quitting: 27.4  . Smokeless tobacco: Never Used  Vaping Use  . Vaping Use: Never used  Substance Use Topics  . Alcohol use: No  . Drug use: No    Review of Systems Constitutional: No fever/chills Eyes: No visual changes. ENT: No trauma. Cardiovascular: Denies chest pain. Respiratory: Denies shortness of breath. Gastrointestinal: No abdominal pain.  No nausea, no vomiting.  Genitourinary: Negative  for dysuria. Musculoskeletal: Positive for left hip pain. Skin: Negative for abrasion or laceration. Neurological: Negative for headaches, focal weakness or numbness. Psychological: Positive for dementia. ____________________________________________   PHYSICAL EXAM:  VITAL SIGNS: ED Triage Vitals [07/18/20 0934]  Enc Vitals Group     BP (!) 161/79     Pulse Rate 66     Resp 18     Temp 98.3 F (36.8 C)     Temp Source Oral     SpO2 98 %     Weight 167 lb 8.8 oz (76 kg)     Height 5\' 10"  (1.778 m)     Head Circumference      Peak Flow  Pain Score      Pain Loc      Pain Edu?      Excl. in [place]?     Constitutional: Alert and oriented. Well appearing and in no acute distress.  Patient is very pleasant and answers questions in addition to his wife. Eyes: Conjunctivae are normal. PERRL. EOMI. Head: Atraumatic. Nose: No trauma. Mouth/Throat: No trauma. Neck: No stridor.  No cervical tenderness on palpation posteriorly.  Cardiovascular: Normal rate, regular rhythm. Grossly normal heart sounds.  Good peripheral circulation. Respiratory: Normal respiratory effort.  No retractions. Lungs CTAB.  No rib pain on palpation bilaterally. Gastrointestinal: Soft and nontender. No distention.  Sounds normoactive x4 quadrants. Musculoskeletal: No tenderness is noted on palpation of the thoracic or lumbar spine.  There is moderate tenderness on palpation of the left hip without gross deformity or skin discoloration at this time.  No rotation or shortening of the left lower extremity is noted.  No tenderness on palpation of the right lower extremity.  Patient is able to move upper extremities without any difficulty. Neurologic:  Normal speech and language. No gross focal neurologic deficits are appreciated.  Skin:  Skin is warm, dry and intact.  No abrasions or discoloration was noted today. Psychiatric: Mood and affect are normal. Speech and behavior are  normal.  ____________________________________________   LABS (all labs ordered are listed, but only abnormal results are displayed)  Labs Reviewed  CBC WITH DIFFERENTIAL/PLATELET - Abnormal; Notable for the following components:      Result Value   Hemoglobin 12.4 (*)    HCT 38.9 (*)    All other components within normal limits  URINALYSIS, COMPLETE (UACMP) WITH MICROSCOPIC - Abnormal; Notable for the following components:   Color, Urine YELLOW (*)    APPearance CLEAR (*)    All other components within normal limits  COMPREHENSIVE METABOLIC PANEL - Abnormal; Notable for the following components:   Creatinine, Ser 1.50 (*)    GFR, Estimated 44 (*)    All other components within normal limits   ____________________________________________  EKG Reviewed by physician and major ED. Sinus bradycardia with a rate of 59. PR interval 188 QRS duration 104 ____________________________________________  RADIOLOGY Leana Gamer, personally viewed and evaluated these images (plain radiographs) as part of my medical decision making, as well as reviewing the written report by the radiologist.   Official radiology report(s): DG Hip Unilat With Pelvis 2-3 Views Left  Result Date: 07/18/2020 CLINICAL DATA:  [place] from bed with left hip pain. EXAM: DG HIP (WITH OR WITHOUT PELVIS) 2-3V LEFT COMPARISON:  None. FINDINGS: Previous lower lumbar fusion surgery. No fracture of the pelvic bones. Sacroiliac joints appear normal. Both hip joints show normal joint space preservation without significant osteophyte formation. No evidence of a left femur fracture. IMPRESSION: No acute or traumatic finding. Electronically Signed   By: Nelson Chimes M.D.   On: 07/18/2020 10:35    ____________________________________________   PROCEDURES  Procedure(s) performed (including Critical Care):  Procedures   ____________________________________________   INITIAL IMPRESSION / ASSESSMENT AND PLAN / ED  COURSE  As part of my medical decision making, I reviewed the following data within the electronic MEDICAL RECORD NUMBER Notes from prior ED visits and [place] Controlled Substance Database  87 year old male is brought to the ED via EMS after he fell from the bed getting up this morning.  Wife was with him and states there was no head injury or loss of consciousness.  Patient states that his left  hip is the only thing that is hurting.  Wife is concerned as he has been taking some medication for Parkinson's which tends to make him more confused especially first thing in the mornings.  She discontinue giving him the medication until yesterday when she was told by home health to begin giving it to him again.  She states this morning he woke with some confusion which she noticed when he first began taking the medication.  Lab work is reassuring and patient's wife was made aware.  We discussed stopping the medication until she has had time to talk to her husband's PCP.  X-ray of the hip was negative for fracture.  Lab work and urinalysis were negative and reassuring.  No pain medications were written due to patient's Parkinson's and also increased risk for falling.  Wife states that she will follow-up with his PCP for further investigation of his medication and if any continued problems.  ____________________________________________   FINAL CLINICAL IMPRESSION(S) / ED DIAGNOSES  Final diagnoses:  Contusion of left hip, initial encounter  Fall, initial encounter     ED Discharge Orders    None      *Please note:  Jason W Schuyler Olden. was evaluated in Emergency Department on 07/18/2020 for the symptoms described in the history of present illness. He was evaluated in the context of the global COVID-19 pandemic, which necessitated consideration that the patient might be at risk for infection with the SARS-CoV-2 virus that causes COVID-19. Institutional protocols and algorithms that pertain to the evaluation of  patients at risk for COVID-19 are in a state of rapid change based on information released by regulatory bodies including the CDC and federal and state organizations. These policies and algorithms were followed during the patient's care in the ED.  Some ED evaluations and interventions may be delayed as a result of limited staffing during and the pandemic.*   Note:  This document was prepared using Dragon voice recognition software and may include unintentional dictation errors.    Johnn Hai, PA-C 07/18/20 1614    Harvest Dark, MD 07/19/20 1942

## 2020-07-23 DIAGNOSIS — M419 Scoliosis, unspecified: Secondary | ICD-10-CM | POA: Diagnosis not present

## 2020-07-23 DIAGNOSIS — I700 Atherosclerosis of aorta: Secondary | ICD-10-CM | POA: Diagnosis not present

## 2020-07-23 DIAGNOSIS — G20 Parkinson's disease: Secondary | ICD-10-CM | POA: Diagnosis not present

## 2020-07-23 DIAGNOSIS — Z9181 History of falling: Secondary | ICD-10-CM | POA: Diagnosis not present

## 2020-07-23 DIAGNOSIS — I10 Essential (primary) hypertension: Secondary | ICD-10-CM | POA: Diagnosis not present

## 2020-07-23 DIAGNOSIS — R69 Illness, unspecified: Secondary | ICD-10-CM | POA: Diagnosis not present

## 2020-07-23 DIAGNOSIS — I2510 Atherosclerotic heart disease of native coronary artery without angina pectoris: Secondary | ICD-10-CM | POA: Diagnosis not present

## 2020-07-23 DIAGNOSIS — H9193 Unspecified hearing loss, bilateral: Secondary | ICD-10-CM | POA: Diagnosis not present

## 2020-07-23 DIAGNOSIS — H3530 Unspecified macular degeneration: Secondary | ICD-10-CM | POA: Diagnosis not present

## 2020-07-25 DIAGNOSIS — I700 Atherosclerosis of aorta: Secondary | ICD-10-CM | POA: Diagnosis not present

## 2020-07-25 DIAGNOSIS — H3530 Unspecified macular degeneration: Secondary | ICD-10-CM | POA: Diagnosis not present

## 2020-07-25 DIAGNOSIS — Z9181 History of falling: Secondary | ICD-10-CM | POA: Diagnosis not present

## 2020-07-25 DIAGNOSIS — I10 Essential (primary) hypertension: Secondary | ICD-10-CM | POA: Diagnosis not present

## 2020-07-25 DIAGNOSIS — G20 Parkinson's disease: Secondary | ICD-10-CM | POA: Diagnosis not present

## 2020-07-25 DIAGNOSIS — H9193 Unspecified hearing loss, bilateral: Secondary | ICD-10-CM | POA: Diagnosis not present

## 2020-07-25 DIAGNOSIS — I2510 Atherosclerotic heart disease of native coronary artery without angina pectoris: Secondary | ICD-10-CM | POA: Diagnosis not present

## 2020-07-25 DIAGNOSIS — M419 Scoliosis, unspecified: Secondary | ICD-10-CM | POA: Diagnosis not present

## 2020-07-25 DIAGNOSIS — R69 Illness, unspecified: Secondary | ICD-10-CM | POA: Diagnosis not present

## 2020-07-28 ENCOUNTER — Encounter: Payer: Self-pay | Admitting: Nurse Practitioner

## 2020-07-29 DIAGNOSIS — R2689 Other abnormalities of gait and mobility: Secondary | ICD-10-CM | POA: Diagnosis not present

## 2020-07-29 DIAGNOSIS — R498 Other voice and resonance disorders: Secondary | ICD-10-CM | POA: Diagnosis not present

## 2020-07-29 DIAGNOSIS — R42 Dizziness and giddiness: Secondary | ICD-10-CM | POA: Diagnosis not present

## 2020-07-29 DIAGNOSIS — R519 Headache, unspecified: Secondary | ICD-10-CM | POA: Diagnosis not present

## 2020-07-29 DIAGNOSIS — R251 Tremor, unspecified: Secondary | ICD-10-CM | POA: Diagnosis not present

## 2020-07-29 DIAGNOSIS — R413 Other amnesia: Secondary | ICD-10-CM | POA: Diagnosis not present

## 2020-07-29 DIAGNOSIS — R262 Difficulty in walking, not elsewhere classified: Secondary | ICD-10-CM | POA: Diagnosis not present

## 2020-07-31 ENCOUNTER — Telehealth: Payer: Self-pay | Admitting: Nurse Practitioner

## 2020-07-31 ENCOUNTER — Other Ambulatory Visit: Payer: Self-pay | Admitting: Physician Assistant

## 2020-07-31 DIAGNOSIS — I639 Cerebral infarction, unspecified: Secondary | ICD-10-CM

## 2020-07-31 NOTE — Telephone Encounter (Signed)
Noted  

## 2020-07-31 NOTE — Telephone Encounter (Signed)
[place] [facility] called duet to pts wife requesting delay for social work visit / they have rescheduled to next Tuesday /FYI

## 2020-07-31 NOTE — Telephone Encounter (Signed)
fyi

## 2020-08-01 DIAGNOSIS — H9193 Unspecified hearing loss, bilateral: Secondary | ICD-10-CM | POA: Diagnosis not present

## 2020-08-01 DIAGNOSIS — I2510 Atherosclerotic heart disease of native coronary artery without angina pectoris: Secondary | ICD-10-CM | POA: Diagnosis not present

## 2020-08-01 DIAGNOSIS — H3530 Unspecified macular degeneration: Secondary | ICD-10-CM | POA: Diagnosis not present

## 2020-08-01 DIAGNOSIS — R69 Illness, unspecified: Secondary | ICD-10-CM | POA: Diagnosis not present

## 2020-08-01 DIAGNOSIS — M419 Scoliosis, unspecified: Secondary | ICD-10-CM | POA: Diagnosis not present

## 2020-08-01 DIAGNOSIS — Z9181 History of falling: Secondary | ICD-10-CM | POA: Diagnosis not present

## 2020-08-01 DIAGNOSIS — I10 Essential (primary) hypertension: Secondary | ICD-10-CM | POA: Diagnosis not present

## 2020-08-01 DIAGNOSIS — G20 Parkinson's disease: Secondary | ICD-10-CM | POA: Diagnosis not present

## 2020-08-01 DIAGNOSIS — I700 Atherosclerosis of aorta: Secondary | ICD-10-CM | POA: Diagnosis not present

## 2020-08-02 ENCOUNTER — Ambulatory Visit: Payer: Medicare HMO

## 2020-08-02 ENCOUNTER — Other Ambulatory Visit: Payer: Self-pay

## 2020-08-02 ENCOUNTER — Ambulatory Visit (INDEPENDENT_AMBULATORY_CARE_PROVIDER_SITE_OTHER): Payer: Medicare HMO | Admitting: Nurse Practitioner

## 2020-08-02 ENCOUNTER — Encounter: Payer: Self-pay | Admitting: Nurse Practitioner

## 2020-08-02 VITALS — BP 134/78 | HR 76 | Temp 98.8°F | Wt 163.0 lb

## 2020-08-02 DIAGNOSIS — G20 Parkinson's disease: Secondary | ICD-10-CM

## 2020-08-02 DIAGNOSIS — F0281 Dementia in other diseases classified elsewhere with behavioral disturbance: Secondary | ICD-10-CM

## 2020-08-02 DIAGNOSIS — F02818 Dementia in other diseases classified elsewhere, unspecified severity, with other behavioral disturbance: Secondary | ICD-10-CM

## 2020-08-02 DIAGNOSIS — R69 Illness, unspecified: Secondary | ICD-10-CM | POA: Diagnosis not present

## 2020-08-02 MED ORDER — MELATONIN 1 MG PO TABS: 1.0000 mg | ORAL_TABLET | Freq: Every evening | ORAL | 4 refills | Status: AC | PRN

## 2020-08-02 NOTE — Patient Instructions (Signed)
Adaptive utensils and Hi Lo Scoop plate == to assist with eating.

## 2020-08-02 NOTE — Progress Notes (Signed)
BP 134/78 (BP Location: Left Arm)   Pulse 76   Temp 98.8 F (37.1 C)   Wt 163 lb (73.9 kg)   SpO2 100%   BMI 23.39 kg/m    Subjective:    Patient ID: Jason Azucena Fallen., male    DOB: Oct 28, 1928, 87 y.o.   MRN: [id number]  HPI: Jason W Gabreal Worton. is a 87 y.o. male  Chief Complaint  Patient presents with   Tremors   Dementia   DEMENTIA: Is followed by neurology, Dr. Melrose Nakayama, last seen 06/10/20 -- diagnosed with Parkinson's.  They did trial Sinemet recently, but this caused worsening hallucinations -- wife tried restarting and continued to have worsening hallucinations.  At baseline has hallucinations.  Continue Gabapentin 300 MG in morning and 600 MG nightly. Continues on Aricept every day.  Goes for MRI next Friday, ordered by neurology.  Palliative has been visiting as of recently, they return for follow-up in upcoming weeks.    His wife does endorse some burn out  He eats three meals a day, but this is difficult due to underlying vision issues, dinner being the most difficult.  Weight in January 2020 was 176 and today 163, his wife reports he used to eat double helpings, but does not eat as much as once did. Not drinking Ensure, reports this made him bloated.  His wife reports he is more tired and sleeps more then used to -- is waking up more at night with some confusion. Wife reports his knees give out sometimes, tires easily with this -- last fall was 2 weeks ago with skin abrasion -- no LOC.  He does not drink much water at home, this has been discussed multiple times.  Started Mirtazapine last visit, but wife feels this makes him groggy.     Has macular degeneration -- last saw eye doctor 11/30/19 -- is doing visual rehab.  Has had 3 different prescriptions in the past year.     Relevant past medical, surgical, family and social history reviewed and updated as indicated. Interim medical history since our last visit reviewed. Allergies and medications reviewed and  updated.  Review of Systems  Constitutional:  Negative for activity change, diaphoresis, fatigue and fever.  Respiratory:  Negative for cough, chest tightness, shortness of breath and wheezing.   Cardiovascular:  Negative for chest pain, palpitations and leg swelling.  Gastrointestinal: Negative.   Neurological: Negative.   Psychiatric/Behavioral: Negative.     Per HPI unless specifically indicated above     Objective:    BP 134/78 (BP Location: Left Arm)   Pulse 76   Temp 98.8 F (37.1 C)   Wt 163 lb (73.9 kg)   SpO2 100%   BMI 23.39 kg/m   Wt Readings from Last 3 Encounters:  08/02/20 163 lb (73.9 kg)  07/18/20 167 lb 8.8 oz (76 kg)  06/28/20 166 lb 9.6 oz (75.6 kg)    Physical Exam Vitals and nursing note reviewed.  Constitutional:      General: He is awake. He is not in acute distress.    Appearance: He is well-developed, well-groomed and underweight. He is not ill-appearing or toxic-appearing.  HENT:     Head: Normocephalic and atraumatic.     Right Ear: Hearing normal. No drainage.     Left Ear: Hearing normal. No drainage.  Eyes:     General: Lids are normal.        Right eye: No discharge.  Left eye: No discharge.     Conjunctiva/sclera: Conjunctivae normal.     Pupils: Pupils are equal, round, and reactive to light.  Neck:     Thyroid: No thyromegaly.     Vascular: No carotid bruit.  Cardiovascular:     Rate and Rhythm: Normal rate and regular rhythm.     Heart sounds: Normal heart sounds, S1 normal and S2 normal. No murmur heard.   No gallop.  Pulmonary:     Effort: Pulmonary effort is normal. No accessory muscle usage or respiratory distress.     Breath sounds: Normal breath sounds.  Abdominal:     General: Bowel sounds are normal. There is no distension.     Palpations: Abdomen is soft. There is no hepatomegaly or splenomegaly.     Tenderness: There is no abdominal tenderness. There is no right CVA tenderness or left CVA tenderness.      Hernia: No hernia is present.  Musculoskeletal:        General: Normal range of motion.     Cervical back: Normal range of motion and neck supple.     Right lower leg: No edema.     Left lower leg: No edema.  Skin:    General: Skin is warm and dry.     Capillary Refill: Capillary refill takes less than 2 seconds.     Comments: Scattered pale purple bruises to bilateral upper extremities.  Neurological:     Mental Status: He is alert.     Deep Tendon Reflexes: Reflexes are normal and symmetric.     Comments: Pleasantly confused (dementia).  He is oriented to month and year + location, not day of week.  Mild cogwheel rigidity noted and shuffle gait with forward lean.  Flat affect.  Psychiatric:        Attention and Perception: Attention normal.        Mood and Affect: Mood normal.        Speech: Speech normal.        Behavior: Behavior normal. Behavior is cooperative.   Results for orders placed or performed during the hospital encounter of 07/18/20  CBC with Differential  Result Value Ref Range   WBC 6.5 4.0 - 10.5 K/uL   RBC 4.36 4.22 - 5.81 MIL/uL   Hemoglobin 12.4 (L) 13.0 - 17.0 g/dL   HCT 38.9 (L) 39.0 - 52.0 %   MCV 89.2 80.0 - 100.0 fL   MCH 28.4 26.0 - 34.0 pg   MCHC 31.9 30.0 - 36.0 g/dL   RDW 14.5 11.5 - 15.5 %   Platelets 190 150 - 400 K/uL   nRBC 0.0 0.0 - 0.2 %   Neutrophils Relative % 61 %   Neutro Abs 3.9 1.7 - 7.7 K/uL   Lymphocytes Relative 27 %   Lymphs Abs 1.7 0.7 - 4.0 K/uL   Monocytes Relative 8 %   Monocytes Absolute 0.5 0.1 - 1.0 K/uL   Eosinophils Relative 3 %   Eosinophils Absolute 0.2 0.0 - 0.5 K/uL   Basophils Relative 1 %   Basophils Absolute 0.1 0.0 - 0.1 K/uL   Immature Granulocytes 0 %   Abs Immature Granulocytes 0.01 0.00 - 0.07 K/uL  Urinalysis, Complete w Microscopic Urine, Clean Catch  Result Value Ref Range   Color, Urine YELLOW (A) YELLOW   APPearance CLEAR (A) CLEAR   Specific Gravity, Urine 1.017 1.005 - 1.030   pH 5.0 5.0 - 8.0    Glucose, UA NEGATIVE NEGATIVE mg/dL  Hgb urine dipstick NEGATIVE NEGATIVE   Bilirubin Urine NEGATIVE NEGATIVE   Ketones, ur NEGATIVE NEGATIVE mg/dL   Protein, ur NEGATIVE NEGATIVE mg/dL   Nitrite NEGATIVE NEGATIVE   Leukocytes,Ua NEGATIVE NEGATIVE   RBC / HPF 0-5 0 - 5 RBC/hpf   WBC, UA 0-5 0 - 5 WBC/hpf   Bacteria, UA NONE SEEN NONE SEEN   Squamous Epithelial / LPF NONE SEEN 0 - 5   Mucus PRESENT   Comprehensive metabolic panel  Result Value Ref Range   Sodium 140 135 - 145 mmol/L   Potassium 4.4 3.5 - 5.1 mmol/L   Chloride 110 98 - 111 mmol/L   CO2 24 22 - 32 mmol/L   Glucose, Bld 95 70 - 99 mg/dL   BUN 23 8 - 23 mg/dL   Creatinine, Ser 1.50 (H) 0.61 - 1.24 mg/dL   Calcium 9.0 8.9 - 10.3 mg/dL   Total Protein 6.6 6.5 - 8.1 g/dL   Albumin 3.6 3.5 - 5.0 g/dL   AST 18 15 - 41 U/L   ALT 11 0 - 44 U/L   Alkaline Phosphatase 89 38 - 126 U/L   Total Bilirubin 0.9 0.3 - 1.2 mg/dL   GFR, Estimated 44 (L) >60 mL/min   Anion gap 6 5 - 15      Assessment & Plan:   Problem List Items Addressed This Visit       Nervous and Auditory   Dementia due to Parkinson's disease with behavioral disturbance (HCC) - Primary    Chronic, progressive.  Discussed with his wife progressive nature of disease process, continue Donepezil 10 MG.  Continue to collaborate with neurology.  Monitor closely for falls and continue PT for fall prevention and strengthening if increased falls.  Continue to collaborate with palliative.  Recommend 1 MG Melatonin for sleep aide as needed, sent script in and may increase as needed to 3 MG. Return in 3 months, sooner if worsening symptoms.  For mood may need to trial addition of Zoloft at low dose in future.       Parkinson disease ([place])    New diagnosis with neurology, did not tolerate Sinemet -- increased hallucinations presented. Continue collaboration with neurology, has upcoming MRI.  Recommend adaptive utensils for eating and scoop plate, which may assist him  and increase food intake.  Return in 3 months.         Follow up plan: Return in about 3 months (around 11/02/2020) for DEMENTIA, HTN.

## 2020-08-02 NOTE — Assessment & Plan Note (Signed)
New diagnosis with neurology, did not tolerate Sinemet -- increased hallucinations presented. Continue collaboration with neurology, has upcoming MRI.  Recommend adaptive utensils for eating and scoop plate, which may assist him and increase food intake.  Return in 3 months.

## 2020-08-02 NOTE — Assessment & Plan Note (Addendum)
Chronic, progressive.  Discussed with his wife progressive nature of disease process, continue Donepezil 10 MG.  Continue to collaborate with neurology.  Monitor closely for falls and continue PT for fall prevention and strengthening if increased falls.  Continue to collaborate with palliative.  Recommend 1 MG Melatonin for sleep aide as needed, sent script in and may increase as needed to 3 MG. Return in 3 months, sooner if worsening symptoms.  For mood may need to trial addition of Zoloft at low dose in future.

## 2020-08-05 DIAGNOSIS — I2510 Atherosclerotic heart disease of native coronary artery without angina pectoris: Secondary | ICD-10-CM | POA: Diagnosis not present

## 2020-08-05 DIAGNOSIS — I10 Essential (primary) hypertension: Secondary | ICD-10-CM | POA: Diagnosis not present

## 2020-08-05 DIAGNOSIS — R69 Illness, unspecified: Secondary | ICD-10-CM | POA: Diagnosis not present

## 2020-08-05 DIAGNOSIS — H3530 Unspecified macular degeneration: Secondary | ICD-10-CM | POA: Diagnosis not present

## 2020-08-05 DIAGNOSIS — Z9181 History of falling: Secondary | ICD-10-CM | POA: Diagnosis not present

## 2020-08-05 DIAGNOSIS — G20 Parkinson's disease: Secondary | ICD-10-CM | POA: Diagnosis not present

## 2020-08-05 DIAGNOSIS — H9193 Unspecified hearing loss, bilateral: Secondary | ICD-10-CM | POA: Diagnosis not present

## 2020-08-05 DIAGNOSIS — M419 Scoliosis, unspecified: Secondary | ICD-10-CM | POA: Diagnosis not present

## 2020-08-05 DIAGNOSIS — I700 Atherosclerosis of aorta: Secondary | ICD-10-CM | POA: Diagnosis not present

## 2020-08-06 DIAGNOSIS — I10 Essential (primary) hypertension: Secondary | ICD-10-CM | POA: Diagnosis not present

## 2020-08-06 DIAGNOSIS — G20 Parkinson's disease: Secondary | ICD-10-CM | POA: Diagnosis not present

## 2020-08-06 DIAGNOSIS — Z9181 History of falling: Secondary | ICD-10-CM | POA: Diagnosis not present

## 2020-08-06 DIAGNOSIS — I2510 Atherosclerotic heart disease of native coronary artery without angina pectoris: Secondary | ICD-10-CM | POA: Diagnosis not present

## 2020-08-06 DIAGNOSIS — M419 Scoliosis, unspecified: Secondary | ICD-10-CM | POA: Diagnosis not present

## 2020-08-06 DIAGNOSIS — H9193 Unspecified hearing loss, bilateral: Secondary | ICD-10-CM | POA: Diagnosis not present

## 2020-08-06 DIAGNOSIS — R69 Illness, unspecified: Secondary | ICD-10-CM | POA: Diagnosis not present

## 2020-08-06 DIAGNOSIS — H3530 Unspecified macular degeneration: Secondary | ICD-10-CM | POA: Diagnosis not present

## 2020-08-06 DIAGNOSIS — I700 Atherosclerosis of aorta: Secondary | ICD-10-CM | POA: Diagnosis not present

## 2020-08-09 ENCOUNTER — Ambulatory Visit
Admission: RE | Admit: 2020-08-09 | Discharge: 2020-08-09 | Disposition: A | Discharge status: Home / Self Care | Payer: Medicare HMO | Source: Ambulatory Visit | Attending: Physician Assistant | Admitting: Physician Assistant

## 2020-08-09 ENCOUNTER — Other Ambulatory Visit: Payer: Self-pay

## 2020-08-09 DIAGNOSIS — J019 Acute sinusitis, unspecified: Secondary | ICD-10-CM | POA: Diagnosis not present

## 2020-08-09 DIAGNOSIS — G319 Degenerative disease of nervous system, unspecified: Secondary | ICD-10-CM | POA: Diagnosis not present

## 2020-08-09 DIAGNOSIS — I639 Cerebral infarction, unspecified: Secondary | ICD-10-CM | POA: Diagnosis not present

## 2020-08-09 DIAGNOSIS — G9389 Other specified disorders of brain: Secondary | ICD-10-CM | POA: Diagnosis not present

## 2020-08-12 ENCOUNTER — Ambulatory Visit: Payer: Medicare HMO | Admitting: Urology

## 2020-08-13 ENCOUNTER — Telehealth: Payer: Self-pay | Admitting: Nurse Practitioner

## 2020-08-13 NOTE — Telephone Encounter (Signed)
Copied from [facility] [id number]. Topic: Quick Communication - Home Health Verbal Orders >> Aug 13, 2020  2:38 PM Leward Quan A wrote: Caller/Agency: [facility] Number: [phone] ok to LM  Requesting OT/PT/Skilled Nursing/Social Work/Speech Therapy: OT hand and fine motor coordination  Frequency: once a week for 6wks

## 2020-08-14 NOTE — Telephone Encounter (Signed)
Left detailed message for Marlowe Kays to give her OK per [facility] for verbal orders for patient to have therapy treatment.

## 2020-08-14 NOTE — Telephone Encounter (Signed)
Please advise 

## 2020-08-20 ENCOUNTER — Other Ambulatory Visit: Payer: Self-pay

## 2020-08-20 ENCOUNTER — Other Ambulatory Visit: Payer: Medicare HMO | Admitting: Primary Care

## 2020-08-20 DIAGNOSIS — Z515 Encounter for palliative care: Secondary | ICD-10-CM

## 2020-08-20 DIAGNOSIS — G308 Other Alzheimer's disease: Secondary | ICD-10-CM | POA: Diagnosis not present

## 2020-08-20 DIAGNOSIS — R69 Illness, unspecified: Secondary | ICD-10-CM | POA: Diagnosis not present

## 2020-08-20 DIAGNOSIS — F0280 Dementia in other diseases classified elsewhere without behavioral disturbance: Secondary | ICD-10-CM

## 2020-08-20 NOTE — Progress Notes (Signed)
[facility] Consult Note Telephone: [phone]  Fax: [phone]    Date of encounter: 08/20/20 PATIENT NAME: Jason Cross. [place] [facility] [address]   [phone] (home)  DOB: 08-15-1928 MRN: [id number] PRIMARY CARE PROVIDER:    Venita Lick, NP,  [place] [address] [phone]  REFERRING PROVIDER:   Venita Lick, NP [address] [place],  [place] [address] [phone]  RESPONSIBLE PARTY:    Contact Information     Name Relation Home Work [place] Spouse [phone]  [phone]        I met face to face with patient and family in  home. Palliative Care was asked to follow this patient by consultation request of  Venita Lick, NP to address advance care planning and complex medical decision making. This is a follow up visit.                                   ASSESSMENT AND PLAN / RECOMMENDATIONS:   Advance Care Planning/Goals of Care: Goals include to maximize quality of life and symptom management. Our advance care planning conversation included a discussion about:    The value and importance of advance care planning  Exploration of personal, cultural or spiritual beliefs that might influence medical decisions  Exploration of goals of care in the event of a sudden injury or illness  Identification  of a healthcare agent - discussed his wife, step son and daughter. They are going to [place] next week. Review and creation of an  advance directive document - MOST created. Patient discussed his wishes to have good QOL. He wants efforts made to treat him for treatable issues. He reflects he will be 89 soon. He decides for full scope as of now and we will continue to discuss as he and family desire.  CODE STATUS: FULL Uploaded in [facility]  Symptom Management/Plan:  Nocturia; Endorses nocturia and incomplete emptying. Feels he retains urine.Gets up 3-4 x / night.  Has  history of TURP. He will see urology soon and I have asked them to discuss with urology RE post TURT options. Pt states he would not want to be catherized ongoing.   Falling:  Has had several falls, went to ED in late May. Since then has Cross in yard, does not use a walker in the yard. We also  discussed his Falling risk increased  being up multiple times a night. He has now ruled out sinemet and will no longer take that due to side effect of falls. Can rise with difficulty from sitting, and uses standard walker.   Follow up Palliative Care Visit: Palliative care will continue to follow for complex medical decision making, advance care planning, and clarification of goals. Return 12 weeks or prn.  I spent 60 minutes providing this consultation. More than 50% of the time in this consultation was spent in counseling and care coordination.  PPS: 40%  HOSPICE ELIGIBILITY/DIAGNOSIS: TBD  Chief Complaint: fall risk  HISTORY OF PRESENT ILLNESS:  Jason Cross. is a 87 y.o. year old male  with immobility, falls, nocturia,  PD, dementia. Has had increasing mobility issues. Has realized he needs to name proxy for health decisions, which he discusses today.  History obtained from review of EMR, discussion with primary team, and interview with family, facility staff/caregiver and/or Jason Cross.  I reviewed available labs, medications, imaging, studies and related documents from the EMR.  Records reviewed and summarized above.   ROS   General: NAD ENMT: denies dysphagia Cardiovascular: denies chest pain, denies DOE Pulmonary: denies cough, denies increased SOB Abdomen: endorses good appetite, endorses occ constipation, endorses continence of bowel GU: denies dysuria, endorses continence of urine MSK:  endorses weakness,  recent  falls reported Skin: denies rashes or wounds Neurological: denies pain, denies insomnia Psych: Endorses positive mood Heme/lymph/immuno: denies bruises,  abnormal bleeding  Physical Exam: Current and past weights: 163 lbs, 10 lb wt loss in several months. Constitutional: NAD General: frail appearing, WNWD EYES: anicteric sclera, lids intact, no discharge  ENMT: some loss of hearing, oral mucous membranes moist, dentition intact CV:  no LE edema Pulmonary: no increased work of breathing, no cough, room air Abdomen: intake 75%,  no ascites GU: deferred MSK: moderate sarcopenia, moves all extremities, ambulatory with walker  Skin: warm and dry, no rashes or wounds on visible skin Neuro:  ++ generalized weakness,  ++ cognitive impairment Psych: non-anxious affect, A and O x 3 Hem/lymph/immuno: no widespread bruising  Patient Active Problem List   Diagnosis Date Noted   At high risk for falls 06/28/2020   Senile purpura ([place]) 06/22/2020   Aortic atherosclerosis ([place]) 06/05/2020   Scoliosis of thoracolumbar spine 06/03/2020   History of 2019 novel coronavirus disease (COVID-19) 03/13/2020   CKD (chronic kidney disease) stage 3, GFR 30-59 ml/min (HCC) 11/26/2019   Difficulty walking 08/04/2017   Expressive aphasia 08/04/2017   Parkinson disease ([place]) 08/04/2017   Advanced care planning/counseling discussion 07/21/2016   GERD (gastroesophageal reflux disease) 12/30/2015   Peripheral neuropathy 07/17/2015   Dementia due to Parkinson's disease with behavioral disturbance ([place]) 07/17/2015   Orthostatic hypotension 07/17/2015   Hypertension 07/17/2015   Coronary artery disease involving coronary bypass graft of native heart without angina pectoris 07/17/2015   Urinary retention with incomplete bladder emptying 03/20/2013   Constipation due to slow transit 03/20/2013   Lumbar spondylolysis 03/14/2013   Lumbar scoliosis 03/10/2013     Thank you for the opportunity to participate in the care of Jason Cross.  The palliative care team will continue to follow. Please call our office at [phone] if we can be of additional assistance.    Jason Coop, NP , DNP, MPH, AGPCNP-BC, ACHPN  COVID-19 PATIENT SCREENING TOOL Asked and negative response unless otherwise noted:   Have you had symptoms of covid, tested positive or been in contact with someone with symptoms/positive test in the past 5-10 days?

## 2020-08-22 ENCOUNTER — Telehealth: Payer: Self-pay | Admitting: Licensed Clinical Social Worker

## 2020-08-22 ENCOUNTER — Telehealth: Payer: Medicare HMO

## 2020-08-22 NOTE — Telephone Encounter (Signed)
    Clinical Social Work  Chronic Care Management   Phone Outreach    08/22/2020 Name: Jason Cross. MRN: [id number] DOB: Jan 23, 1929  Jason Cross. is a 87 y.o. year old male who is a primary care patient of Cannady, Barbaraann Faster, NP .   F/U phone call today to assess needs, and progress with care plan goals.   A HIPPA compliant phone message was left for the patient providing contact information and requesting a return call.   Plan:CCM LCSW will wait for return call. If no return call is received, Will reach out to patient again in the next 14 days .   Review of patient status, including review of consultants reports, relevant laboratory and other test results, and collaboration with appropriate care team members and the patient's provider was performed as part of comprehensive patient evaluation and provision of care management services.    Christa See, MSW, [email or web address] Phone [phone] 4:59 PM

## 2020-08-28 ENCOUNTER — Emergency Department: Payer: Medicare HMO

## 2020-08-28 ENCOUNTER — Other Ambulatory Visit: Payer: Self-pay

## 2020-08-28 ENCOUNTER — Encounter: Payer: Self-pay | Admitting: Internal Medicine

## 2020-08-28 ENCOUNTER — Inpatient Hospital Stay
Admission: EM | Admit: 2020-08-28 | Discharge: 2020-08-30 | DRG: 871 | Disposition: A | Discharge status: Home / Self Care | Payer: Medicare HMO | Attending: Internal Medicine | Admitting: Internal Medicine

## 2020-08-28 DIAGNOSIS — R0689 Other abnormalities of breathing: Secondary | ICD-10-CM | POA: Diagnosis not present

## 2020-08-28 DIAGNOSIS — G20 Parkinson's disease: Secondary | ICD-10-CM | POA: Diagnosis present

## 2020-08-28 DIAGNOSIS — N3000 Acute cystitis without hematuria: Secondary | ICD-10-CM | POA: Diagnosis present

## 2020-08-28 DIAGNOSIS — N3001 Acute cystitis with hematuria: Secondary | ICD-10-CM | POA: Diagnosis not present

## 2020-08-28 DIAGNOSIS — I252 Old myocardial infarction: Secondary | ICD-10-CM | POA: Diagnosis not present

## 2020-08-28 DIAGNOSIS — N200 Calculus of kidney: Secondary | ICD-10-CM | POA: Diagnosis present

## 2020-08-28 DIAGNOSIS — R509 Fever, unspecified: Secondary | ICD-10-CM | POA: Diagnosis not present

## 2020-08-28 DIAGNOSIS — O8604 Sepsis following an obstetrical procedure: Secondary | ICD-10-CM

## 2020-08-28 DIAGNOSIS — A419 Sepsis, unspecified organism: Secondary | ICD-10-CM | POA: Diagnosis not present

## 2020-08-28 DIAGNOSIS — Z951 Presence of aortocoronary bypass graft: Secondary | ICD-10-CM

## 2020-08-28 DIAGNOSIS — R6520 Severe sepsis without septic shock: Secondary | ICD-10-CM

## 2020-08-28 DIAGNOSIS — R350 Frequency of micturition: Secondary | ICD-10-CM | POA: Insufficient documentation

## 2020-08-28 DIAGNOSIS — R0902 Hypoxemia: Secondary | ICD-10-CM | POA: Diagnosis not present

## 2020-08-28 DIAGNOSIS — G9340 Encephalopathy, unspecified: Secondary | ICD-10-CM | POA: Diagnosis not present

## 2020-08-28 DIAGNOSIS — Z20822 Contact with and (suspected) exposure to covid-19: Secondary | ICD-10-CM | POA: Diagnosis not present

## 2020-08-28 DIAGNOSIS — G629 Polyneuropathy, unspecified: Secondary | ICD-10-CM | POA: Diagnosis present

## 2020-08-28 DIAGNOSIS — A4151 Sepsis due to Escherichia coli [E. coli]: Secondary | ICD-10-CM | POA: Diagnosis not present

## 2020-08-28 DIAGNOSIS — N390 Urinary tract infection, site not specified: Secondary | ICD-10-CM | POA: Diagnosis not present

## 2020-08-28 DIAGNOSIS — E876 Hypokalemia: Secondary | ICD-10-CM | POA: Diagnosis present

## 2020-08-28 DIAGNOSIS — K219 Gastro-esophageal reflux disease without esophagitis: Secondary | ICD-10-CM | POA: Diagnosis present

## 2020-08-28 DIAGNOSIS — Z8349 Family history of other endocrine, nutritional and metabolic diseases: Secondary | ICD-10-CM

## 2020-08-28 DIAGNOSIS — I2583 Coronary atherosclerosis due to lipid rich plaque: Secondary | ICD-10-CM

## 2020-08-28 DIAGNOSIS — R0789 Other chest pain: Secondary | ICD-10-CM | POA: Diagnosis not present

## 2020-08-28 DIAGNOSIS — F02818 Dementia in other diseases classified elsewhere, unspecified severity, with other behavioral disturbance: Secondary | ICD-10-CM

## 2020-08-28 DIAGNOSIS — Z66 Do not resuscitate: Secondary | ICD-10-CM | POA: Diagnosis not present

## 2020-08-28 DIAGNOSIS — N281 Cyst of kidney, acquired: Secondary | ICD-10-CM | POA: Diagnosis not present

## 2020-08-28 DIAGNOSIS — R000 Tachycardia, unspecified: Secondary | ICD-10-CM | POA: Diagnosis not present

## 2020-08-28 DIAGNOSIS — Z803 Family history of malignant neoplasm of breast: Secondary | ICD-10-CM

## 2020-08-28 DIAGNOSIS — A4150 Gram-negative sepsis, unspecified: Secondary | ICD-10-CM

## 2020-08-28 DIAGNOSIS — R54 Age-related physical debility: Secondary | ICD-10-CM | POA: Diagnosis present

## 2020-08-28 DIAGNOSIS — Z885 Allergy status to narcotic agent status: Secondary | ICD-10-CM

## 2020-08-28 DIAGNOSIS — Z7982 Long term (current) use of aspirin: Secondary | ICD-10-CM | POA: Diagnosis not present

## 2020-08-28 DIAGNOSIS — N320 Bladder-neck obstruction: Secondary | ICD-10-CM | POA: Diagnosis present

## 2020-08-28 DIAGNOSIS — R531 Weakness: Secondary | ICD-10-CM | POA: Diagnosis not present

## 2020-08-28 DIAGNOSIS — R079 Chest pain, unspecified: Secondary | ICD-10-CM | POA: Diagnosis not present

## 2020-08-28 DIAGNOSIS — R69 Illness, unspecified: Secondary | ICD-10-CM | POA: Diagnosis not present

## 2020-08-28 DIAGNOSIS — F0280 Dementia in other diseases classified elsewhere without behavioral disturbance: Secondary | ICD-10-CM | POA: Diagnosis not present

## 2020-08-28 DIAGNOSIS — K5900 Constipation, unspecified: Secondary | ICD-10-CM | POA: Diagnosis present

## 2020-08-28 DIAGNOSIS — Z8249 Family history of ischemic heart disease and other diseases of the circulatory system: Secondary | ICD-10-CM | POA: Diagnosis not present

## 2020-08-28 DIAGNOSIS — R319 Hematuria, unspecified: Secondary | ICD-10-CM | POA: Diagnosis not present

## 2020-08-28 DIAGNOSIS — N401 Enlarged prostate with lower urinary tract symptoms: Secondary | ICD-10-CM | POA: Diagnosis present

## 2020-08-28 DIAGNOSIS — G9341 Metabolic encephalopathy: Secondary | ICD-10-CM | POA: Diagnosis present

## 2020-08-28 DIAGNOSIS — Z8673 Personal history of transient ischemic attack (TIA), and cerebral infarction without residual deficits: Secondary | ICD-10-CM

## 2020-08-28 DIAGNOSIS — Z87891 Personal history of nicotine dependence: Secondary | ICD-10-CM

## 2020-08-28 DIAGNOSIS — Z91041 Radiographic dye allergy status: Secondary | ICD-10-CM

## 2020-08-28 DIAGNOSIS — Z743 Need for continuous supervision: Secondary | ICD-10-CM | POA: Diagnosis not present

## 2020-08-28 DIAGNOSIS — I2510 Atherosclerotic heart disease of native coronary artery without angina pectoris: Secondary | ICD-10-CM | POA: Diagnosis not present

## 2020-08-28 DIAGNOSIS — R338 Other retention of urine: Secondary | ICD-10-CM | POA: Diagnosis not present

## 2020-08-28 LAB — CBC WITH DIFFERENTIAL/PLATELET
Abs Immature Granulocytes: 0.03 10*3/uL (ref 0.00–0.07)
Basophils Absolute: 0.1 10*3/uL (ref 0.0–0.1)
Basophils Relative: 1 %
Eosinophils Absolute: 0 10*3/uL (ref 0.0–0.5)
Eosinophils Relative: 0 %
HCT: 38 % — ABNORMAL LOW (ref 39.0–52.0)
Hemoglobin: 12 g/dL — ABNORMAL LOW (ref 13.0–17.0)
Immature Granulocytes: 0 %
Lymphocytes Relative: 7 %
Lymphs Abs: 0.7 10*3/uL (ref 0.7–4.0)
MCH: 28.2 pg (ref 26.0–34.0)
MCHC: 31.6 g/dL (ref 30.0–36.0)
MCV: 89.2 fL (ref 80.0–100.0)
Monocytes Absolute: 0.2 10*3/uL (ref 0.1–1.0)
Monocytes Relative: 2 %
Neutro Abs: 8.9 10*3/uL — ABNORMAL HIGH (ref 1.7–7.7)
Neutrophils Relative %: 90 %
Platelets: 211 10*3/uL (ref 150–400)
RBC: 4.26 MIL/uL (ref 4.22–5.81)
RDW: 14.7 % (ref 11.5–15.5)
WBC: 9.8 10*3/uL (ref 4.0–10.5)
nRBC: 0 % (ref 0.0–0.2)

## 2020-08-28 LAB — COMPREHENSIVE METABOLIC PANEL
ALT: 16 U/L (ref 0–44)
AST: 18 U/L (ref 15–41)
Albumin: 3.9 g/dL (ref 3.5–5.0)
Alkaline Phosphatase: 89 U/L (ref 38–126)
Anion gap: 6 (ref 5–15)
BUN: 22 mg/dL (ref 8–23)
CO2: 24 mmol/L (ref 22–32)
Calcium: 8.9 mg/dL (ref 8.9–10.3)
Chloride: 109 mmol/L (ref 98–111)
Creatinine, Ser: 1.38 mg/dL — ABNORMAL HIGH (ref 0.61–1.24)
GFR, Estimated: 49 mL/min — ABNORMAL LOW (ref >60)
Glucose, Bld: 113 mg/dL — ABNORMAL HIGH (ref 70–99)
Potassium: 4 mmol/L (ref 3.5–5.1)
Sodium: 139 mmol/L (ref 135–145)
Total Bilirubin: 1.8 mg/dL — ABNORMAL HIGH (ref 0.3–1.2)
Total Protein: 7 g/dL (ref 6.5–8.1)

## 2020-08-28 LAB — URINALYSIS, COMPLETE (UACMP) WITH MICROSCOPIC
Bilirubin Urine: NEGATIVE
Glucose, UA: NEGATIVE mg/dL
Ketones, ur: 5 mg/dL — AB
Nitrite: NEGATIVE
Protein, ur: 100 mg/dL — AB
RBC / HPF: >50 RBC/hpf — ABNORMAL HIGH (ref 0–5)
Specific Gravity, Urine: 1.013 (ref 1.005–1.030)
WBC, UA: >50 WBC/hpf — ABNORMAL HIGH (ref 0–5)
pH: 5 (ref 5.0–8.0)

## 2020-08-28 LAB — LACTIC ACID, PLASMA: Lactic Acid, Venous: 1.8 mmol/L (ref 0.5–1.9)

## 2020-08-28 LAB — BLOOD GAS, VENOUS
Acid-base deficit: 0.3 mmol/L (ref 0.0–2.0)
Bicarbonate: 24 mmol/L (ref 20.0–28.0)
O2 Saturation: 66.9 %
Patient temperature: 37
pCO2, Ven: 37 mmHg — ABNORMAL LOW (ref 44.0–60.0)
pH, Ven: 7.42 (ref 7.250–7.430)
pO2, Ven: 34 mmHg (ref 32.0–45.0)

## 2020-08-28 LAB — PROTIME-INR
INR: 1.1 (ref 0.8–1.2)
Prothrombin Time: 14.2 seconds (ref 11.4–15.2)

## 2020-08-28 LAB — TROPONIN I (HIGH SENSITIVITY)
Troponin I (High Sensitivity): 12 ng/L (ref <18)
Troponin I (High Sensitivity): 42 ng/L — ABNORMAL HIGH (ref <18)

## 2020-08-28 LAB — RESP PANEL BY RT-PCR (FLU A&B, COVID) ARPGX2
Influenza A by PCR: NEGATIVE
Influenza B by PCR: NEGATIVE
SARS Coronavirus 2 by RT PCR: NEGATIVE

## 2020-08-28 LAB — MAGNESIUM: Magnesium: 1.8 mg/dL (ref 1.7–2.4)

## 2020-08-28 LAB — APTT: aPTT: 24 seconds (ref 24–36)

## 2020-08-28 MED ORDER — ENOXAPARIN SODIUM 40 MG/0.4ML IJ SOSY
40.0000 mg | PREFILLED_SYRINGE | INTRAMUSCULAR | Status: DC
  Administered 2020-08-28 – 2020-08-29 (×2): 40 mg via SUBCUTANEOUS
  Filled 2020-08-28 (×2): qty 0.4

## 2020-08-28 MED ORDER — SODIUM CHLORIDE 0.9 % IV SOLN
1.0000 g | Freq: Once | INTRAVENOUS | Status: AC
  Administered 2020-08-28: 1 g via INTRAVENOUS
  Filled 2020-08-28: qty 1

## 2020-08-28 MED ORDER — SODIUM CHLORIDE 0.9 % IV SOLN
1.0000 g | INTRAVENOUS | Status: DC
  Administered 2020-08-28: 1 g via INTRAVENOUS
  Filled 2020-08-28 (×2): qty 10

## 2020-08-28 MED ORDER — ASPIRIN EC 81 MG PO TBEC
81.0000 mg | DELAYED_RELEASE_TABLET | Freq: Every morning | ORAL | Status: DC
  Administered 2020-08-29: 81 mg via ORAL
  Filled 2020-08-28: qty 1

## 2020-08-28 MED ORDER — ACETAMINOPHEN 325 MG PO TABS
650.0000 mg | ORAL_TABLET | Freq: Once | ORAL | Status: AC
  Administered 2020-08-28: 650 mg via ORAL

## 2020-08-28 MED ORDER — ACETAMINOPHEN 325 MG PO TABS: 650.0000 mg | ORAL_TABLET | Freq: Four times a day (QID) | ORAL | Status: DC | PRN

## 2020-08-28 MED ORDER — TAMSULOSIN HCL 0.4 MG PO CAPS
0.4000 mg | ORAL_CAPSULE | Freq: Every day | ORAL | Status: DC
  Administered 2020-08-29: 0.4 mg via ORAL
  Filled 2020-08-28: qty 1

## 2020-08-28 MED ORDER — ONDANSETRON HCL 4 MG/2ML IJ SOLN
4.0000 mg | Freq: Four times a day (QID) | INTRAMUSCULAR | Status: DC | PRN
Start: 2020-08-28 — End: 2020-08-30

## 2020-08-28 MED ORDER — SODIUM CHLORIDE 0.9 % IV BOLUS
1000.0000 mL | Freq: Once | INTRAVENOUS | Status: AC
  Administered 2020-08-28: 1000 mL via INTRAVENOUS

## 2020-08-28 MED ORDER — MELATONIN 5 MG PO TABS: 2.5000 mg | ORAL_TABLET | Freq: Every evening | ORAL | Status: DC | PRN

## 2020-08-28 MED ORDER — SODIUM CHLORIDE 0.9 % IV SOLN: INTRAVENOUS | Status: DC

## 2020-08-28 MED ORDER — ACETAMINOPHEN 650 MG RE SUPP: 650.0000 mg | Freq: Four times a day (QID) | RECTAL | Status: DC | PRN

## 2020-08-28 MED ORDER — GABAPENTIN 300 MG PO CAPS
600.0000 mg | ORAL_CAPSULE | Freq: Every day | ORAL | Status: DC
  Administered 2020-08-28 – 2020-08-29 (×2): 600 mg via ORAL
  Filled 2020-08-28 (×3): qty 2

## 2020-08-28 MED ORDER — PANTOPRAZOLE SODIUM 40 MG PO TBEC
40.0000 mg | DELAYED_RELEASE_TABLET | Freq: Every day | ORAL | Status: DC
  Administered 2020-08-29 – 2020-08-30 (×2): 40 mg via ORAL
  Filled 2020-08-28 (×2): qty 1

## 2020-08-28 MED ORDER — GABAPENTIN 300 MG PO CAPS
300.0000 mg | ORAL_CAPSULE | Freq: Every morning | ORAL | Status: DC
  Administered 2020-08-29 – 2020-08-30 (×2): 300 mg via ORAL

## 2020-08-28 MED ORDER — ACETAMINOPHEN 325 MG PO TABS
ORAL_TABLET | ORAL | Status: AC
  Administered 2020-08-28: 650 mg via ORAL
  Filled 2020-08-28: qty 2

## 2020-08-28 MED ORDER — ONDANSETRON HCL 4 MG PO TABS: 4.0000 mg | ORAL_TABLET | Freq: Four times a day (QID) | ORAL | Status: DC | PRN

## 2020-08-28 MED ORDER — OCUVITE-LUTEIN PO CAPS
1.0000 | ORAL_CAPSULE | Freq: Every day | ORAL | Status: DC
  Administered 2020-08-29 – 2020-08-30 (×2): 1 via ORAL
  Filled 2020-08-28 (×2): qty 1

## 2020-08-28 MED ORDER — DONEPEZIL HCL 5 MG PO TABS
10.0000 mg | ORAL_TABLET | Freq: Every day | ORAL | Status: DC
  Administered 2020-08-28 – 2020-08-29 (×2): 10 mg via ORAL
  Filled 2020-08-28 (×3): qty 2

## 2020-08-28 NOTE — ED Notes (Signed)
Pt cleaned prior to transport

## 2020-08-28 NOTE — ED Notes (Signed)
Transport requested

## 2020-08-28 NOTE — H&P (Signed)
[facility] NAME: Jason Cross    MR#:  [id number]  DATE OF BIRTH:  [birth year]  DATE OF ADMISSION:  08/28/2020  PRIMARY CARE PHYSICIAN: Venita Lick, NP   REQUESTING/REFERRING PHYSICIAN: Dr. Conni Slipper  CHIEF COMPLAINT:   Chief Complaint  Patient presents with  . Chest Pain  . Fever    HISTORY OF PRESENT ILLNESS:  Jason Cross  is a 87 y.o. male with a known history of dementia, Parkinson's disease, neuropathy, CAD presents with shaking chills and found to have a high fever.  Patient also had some altered mental status where he was going to put on his tennis shoes to work with occupational therapy but only put 1 shoe on.  Patient is hard of hearing but able to answer questions and follow some commands.Patient had a fever of 103.2 and heart rate of 103.  Patient was found to have a positive urine analysis.  PAST MEDICAL HISTORY:   Past Medical History:  Diagnosis Date  . Arthritis   . Benign prostate hyperplasia   . Cataracts, bilateral   . Coarse tremors    L>R hands  . COPD (chronic obstructive pulmonary disease) ([place])    first stages  . Coronary artery disease   . Dizziness    occasional  . Gastric ulcer    in the 60's  . GERD (gastroesophageal reflux disease)    takes Omeprazole every other day  . Hard of hearing    doesn't wear hearing aids  . History of colon polyps   . History of kidney stones    still one on the left side, low  . Hyperlipidemia    takes Simvastatin daily  . Hypertension    stopped BP meds, BP under control  . Macular degeneration   . Myocardial infarction ([place]) 2009  . Peripheral neuropathy    takes Gabapentin daily  . Pneumonia    hx of in the 60's  . Sleep apnea     PAST SURGICAL HISTORY:   Past Surgical History:  Procedure Laterality Date  . BACK SURGERY  2007  . COLONOSCOPY    . CORONARY ARTERY BYPASS GRAFT  2009   LIMA to LAD, SVG to D1 07/2007  . EYE SURGERY  Bilateral 2004, 2005   implants  . GREEN LIGHT LASER TURP (TRANSURETHRAL RESECTION OF PROSTATE N/A 05/10/2013   Procedure: GREEN LIGHT LASER TURP (TRANSURETHRAL RESECTION OF PROSTATE;  Surgeon: Ardis Hughs, MD;  Location: [facility];  Service: Urology;  Laterality: N/A;  . lipoma removed     chest wall  . microthermo prostate  2009  . [place]  . PROSTATE SURGERY  10/14  . THORACIC DISCECTOMY N/A 03/10/2013   Procedure: Thoracic twelve-Lumbar one Laminectomy;  Surgeon: Erline Levine, MD;  Location: [facility];  Service: Neurosurgery;  Laterality: N/A;  Thoracic twelve-Lumbar one Laminectomy  . TONSILLECTOMY     at age 32    SOCIAL HISTORY:   Social History   Tobacco Use  . Smoking status: Former    Years: 40.00    Pack years: 0.00    Types: Cigarettes    Quit date: 02/23/1993    Years since quitting: 27.5  . Smokeless tobacco: Never  Substance Use Topics  . Alcohol use: No    FAMILY HISTORY:   Family History  Problem Relation Age of Onset  . Hip fracture Mother   . Heart disease Father   . Hypertension  Father   . Heart attack Father   . Thyroid disease Father   . Cancer Sister        breast  . Breast cancer Sister     DRUG ALLERGIES:   Allergies  Allergen Reactions  . Contrast Media [Iodinated Diagnostic Agents] Hives, Itching and Rash  . Codeine Nausea And Vomiting    REVIEW OF SYSTEMS:  CONSTITUTIONAL: Positive for fever, shaking chills. EYES: Positive for macular degeneration EARS, NOSE, AND THROAT: Positive for poor hearing RESPIRATORY: No cough, shortness of breath, wheezing or hemoptysis.  CARDIOVASCULAR: No chest pain, orthopnea, edema.  GASTROINTESTINAL: No nausea, vomiting, diarrhea or abdominal pain. GENITOURINARY: Positive with discolored urine and urinary frequency HEMATOLOGY: No anemia SKIN: No rash or lesion. MUSCULOSKELETAL: No joint pain NEUROLOGIC: History of Parkinson's disease PSYCHIATRY: No anxiety or depression.    MEDICATIONS AT HOME:   Prior to Admission medications   Medication Sig Start Date End Date Taking? Authorizing Provider  acetaminophen (TYLENOL) 500 MG tablet Take 500 mg by mouth every 6 (six) hours as needed.    [provider]  aspirin EC 81 MG tablet Take 81 mg by mouth every morning.    [provider]  donepezil (ARICEPT) 10 MG tablet Take 1 tablet (10 mg total) by mouth at bedtime. 06/26/19   Cannady, Henrine Screws T, NP  gabapentin (NEURONTIN) 300 MG capsule Take 1 capsule by mouth 3 (three) times daily. 06/12/19   [provider]  losartan (COZAAR) 25 MG tablet Take 25 mg by mouth daily. 06/03/19   [provider]  melatonin 1 MG TABS tablet Take 1 tablet (1 mg total) by mouth at bedtime as needed. 08/02/20   Cannady, Henrine Screws T, NP  Multiple Vitamins-Minerals (PRESERVISION AREDS 2 PO) Take 2 tablets by mouth daily.    [provider]  omeprazole (PRILOSEC) 40 MG capsule Take 40 mg by mouth daily. 08/28/19   [provider]      VITAL SIGNS:  Blood pressure 100/61, pulse 83, temperature (!) 101.1 F (38.4 C), temperature source Axillary, resp. rate 17, height 5\' 10"  (1.778 m), weight 73.9 kg, SpO2 96 %.  PHYSICAL EXAMINATION:  GENERAL:  87 y.o.-year-old patient lying in the bed with no acute distress.  EYES: Pupils equal, round, reactive to light and accommodation. No scleral icterus. Extraocular muscles intact.  HEENT: Head atraumatic, normocephalic. Oropharynx and nasopharynx clear.  NECK:  Supple, no jugular venous distention. No thyroid enlargement, no tenderness.  LUNGS: Normal breath sounds bilaterally, no wheezing, rales,rhonchi or crepitation. No use of accessory muscles of respiration.  CARDIOVASCULAR: S1, S2 normal. No murmurs, rubs, or gallops.  ABDOMEN: Soft, nontender, nondistended. Bowel sounds present. No organomegaly or mass.  EXTREMITIES: No pedal edema, cyanosis, or clubbing.  NEUROLOGIC: Cranial nerves II through XII  are intact. Muscle strength 5/5 in all extremities. Sensation intact. Gait not checked.  PSYCHIATRIC: The patient is alert and oriented x 3.  SKIN: No rash, lesion, or ulcer.   LABORATORY PANEL:   CBC Recent Labs  Lab 08/28/20 1140  WBC 9.8  HGB 12.0*  HCT 38.0*  PLT 211   ------------------------------------------------------------------------------------------------------------------  Chemistries  Recent Labs  Lab 08/28/20 1140  NA 139  K 4.0  CL 109  CO2 24  GLUCOSE 113*  BUN 22  CREATININE 1.38*  CALCIUM 8.9  AST 18  ALT 16  ALKPHOS 89  BILITOT 1.8*   ------------------------------------------------------------------------------------------------------------------   RADIOLOGY:  DG Chest Portable 1 View  Result Date: 08/28/2020 CLINICAL DATA:  Fever  and chest pain. EXAM: PORTABLE CHEST 1 VIEW COMPARISON:  05/19/2016 FINDINGS: The cardiac silhouette, mediastinal and hilar contours are within normal limits and stable. Stable tortuosity and calcification of the thoracic aorta. Surgical changes from coronary artery bypass surgery. No acute pulmonary findings. No infiltrates, edema or effusions. No pulmonary lesions. The bony thorax is intact. IMPRESSION: No acute cardiopulmonary findings. Electronically Signed   By: Marijo Sanes M.D.   On: 08/28/2020 12:31    EKG:   Interpreted by me.  Sinus tachycardia 101 bpm, left axis deviation, poor R wave progression, flipped T wave in V6.  IMPRESSION AND PLAN:   1.  Clinical sepsis with fever and tachycardia and acute cystitis with hematuria.  Acute metabolic encephalopathy likely secondary to sepsis.  ER physician ordered a CT to rule out kidney stone.  Will start Rocephin this evening.  Patient already received a dose of antibiotic in the emergency room.  Follow-up urine cultures and blood cultures. 2.  History of CAD on aspirin. 3.  BPH with urinary frequency start Flomax. 4.  Dementia on Aricept 5.  Neuropathy on  gabapentin 6.  Parkinson's disease not on any medications for this. 7.  Weakness.  Physical therapy evaluate.    All the records are reviewed and case discussed with ED provider. Management plans discussed with the patient, family and they are in agreement. The patient meets inpatient criteria and will require 2 midnights in the hospital secondary to having shaking chills and high fever and found to have acute cystitis.   CODE STATUS: DNR  TOTAL TIME TAKING CARE OF THIS PATIENT: 50 minutes.    Loletha Grayer M.D on 08/28/2020 at 5:00 PM   [facility]  CC: Primary care physician; Venita Lick, NP

## 2020-08-28 NOTE — ED Notes (Signed)
Pt at CT

## 2020-08-28 NOTE — ED Notes (Signed)
Pt presents to ED via EMS with c/o of having chest pain that started gradually per EMS, pt does have a HX of dementia is poor historian at this time. Pt does present febrile with a oral temp of 103 but denies a cough or issues with urination at this time.   HR slightly elevated at 90-100bpm, pt is not tachypnea, pt denies SOB at this time. NAD noted at this time. BP stable.

## 2020-08-28 NOTE — ED Notes (Signed)
Patient is resting comfortably. 

## 2020-08-28 NOTE — ED Provider Notes (Signed)
[facility] Emergency Department Provider Note   ____________________________________________   Event Date/Time   First MD Initiated Contact with Patient 08/28/20 1141     (approximate)  I have reviewed the triage vital signs and the nursing notes.   HISTORY  Chief Complaint Chest Pain and Fever    HPI Jason Cross. is a 87 y.o. male patient who has dementia and is not a good historian.  Initial history obtained by EMS later wife corroborates much of this.  Patient apparently was at his baseline status this morning and went to get his shoes and then came back and was somewhat shaky.  He began to complain of some chest pain when EMS was called but this went away quickly.  On arrival in the emergency room patient is slightly more disoriented than usual and running a fever of 102.  He is somewhat tachycardic at a rate of 101.  He is not coughing.  His urine has been dark.  O2 sats are good.         Past Medical History:  Diagnosis Date   Arthritis    Benign prostate hyperplasia    Cataracts, bilateral    Coarse tremors    L>R hands   COPD (chronic obstructive pulmonary disease) (HCC)    first stages   Coronary artery disease    Dizziness    occasional   Gastric ulcer    in the 60's   GERD (gastroesophageal reflux disease)    takes Omeprazole every other day   Hard of hearing    doesn't wear hearing aids   History of colon polyps    History of kidney stones    still one on the left side, low   Hyperlipidemia    takes Simvastatin daily   Hypertension    stopped BP meds, BP under control   Macular degeneration    Myocardial infarction ([place]) 2009   Peripheral neuropathy    takes Gabapentin daily   Pneumonia    hx of in the 60's   Sleep apnea     Patient Active Problem List   Diagnosis Date Noted   At high risk for falls 06/28/2020   Senile purpura ([place]) 06/22/2020   Aortic atherosclerosis ([place]) 06/05/2020   Scoliosis of  thoracolumbar spine 06/03/2020   History of 2019 novel coronavirus disease (COVID-19) 03/13/2020   CKD (chronic kidney disease) stage 3, GFR 30-59 ml/min ([place]) 11/26/2019   Difficulty walking 08/04/2017   Expressive aphasia 08/04/2017   Parkinson disease ([place]) 08/04/2017   Advanced care planning/counseling discussion 07/21/2016   GERD (gastroesophageal reflux disease) 12/30/2015   Peripheral neuropathy 07/17/2015   Dementia due to Parkinson's disease with behavioral disturbance ([place]) 07/17/2015   Orthostatic hypotension 07/17/2015   Hypertension 07/17/2015   Coronary artery disease involving coronary bypass graft of native heart without angina pectoris 07/17/2015   Urinary retention with incomplete bladder emptying 03/20/2013   Constipation due to slow transit 03/20/2013   Lumbar spondylolysis 03/14/2013   Lumbar scoliosis 03/10/2013    Past Surgical History:  Procedure Laterality Date   BACK SURGERY  2007   COLONOSCOPY     CORONARY ARTERY BYPASS GRAFT  2009   LIMA to LAD, SVG to D1 07/2007   EYE SURGERY Bilateral 2004, 2005   implants   GREEN LIGHT LASER TURP (TRANSURETHRAL RESECTION OF PROSTATE N/A 05/10/2013   Procedure: GREEN LIGHT LASER TURP (TRANSURETHRAL RESECTION OF PROSTATE;  Surgeon: Ardis Hughs, MD;  Location: [facility];  Service: Urology;  Laterality: N/A;   lipoma removed     chest wall   microthermo prostate  2009   [place] SURGERY  10/14   THORACIC DISCECTOMY N/A 03/10/2013   Procedure: Thoracic twelve-Lumbar one Laminectomy;  Surgeon: Erline Levine, MD;  Location: [place] [facility];  Service: Neurosurgery;  Laterality: N/A;  Thoracic twelve-Lumbar one Laminectomy   TONSILLECTOMY     at age 6    Prior to Admission medications   Medication Sig Start Date End Date Taking? Authorizing Provider  acetaminophen (TYLENOL) 500 MG tablet Take 500 mg by mouth every 6 (six) hours as needed.    [provider]  aspirin EC 81 MG tablet Take 81  mg by mouth every morning.    [provider]  donepezil (ARICEPT) 10 MG tablet Take 1 tablet (10 mg total) by mouth at bedtime. 06/26/19   Cannady, Henrine Screws T, NP  gabapentin (NEURONTIN) 300 MG capsule Take 1 capsule by mouth 3 (three) times daily. 06/12/19   [provider]  losartan (COZAAR) 25 MG tablet Take 25 mg by mouth daily. 06/03/19   [provider]  melatonin 1 MG TABS tablet Take 1 tablet (1 mg total) by mouth at bedtime as needed. 08/02/20   Cannady, Henrine Screws T, NP  Multiple Vitamins-Minerals (PRESERVISION AREDS 2 PO) Take 2 tablets by mouth daily.    [provider]  omeprazole (PRILOSEC) 40 MG capsule Take 40 mg by mouth daily. 08/28/19   [provider]    Allergies Contrast media [iodinated diagnostic agents] and Codeine  Family History  Problem Relation Age of Onset   Heart disease Father    Hypertension Father    Heart attack Father    Thyroid disease Father    Cancer Sister        breast   Breast cancer Sister     Social History Social History   Tobacco Use   Smoking status: Former    Years: 40.00    Pack years: 0.00    Types: Cigarettes    Quit date: 02/23/1993    Years since quitting: 27.5   Smokeless tobacco: Never  Vaping Use   Vaping Use: Never used  Substance Use Topics   Alcohol use: No   Drug use: No    Review of Systems Review of systems somewhat limited by patient not feeling well and dementia however the below appears to be true Constitutional:fever/chills ENT: No sore throat. Cardiovascular: Denies chest pain. Respiratory: Denies shortness of breath. Gastrointestinal: No abdominal pain.  No nausea, no vomiting.  No diarrhea.  No constipation. Musculoskeletal: Negative for back pain. Skin: Negative for rash.  ____________________________________________   PHYSICAL EXAM:  VITAL SIGNS: ED Triage Vitals  Enc Vitals Group     BP 08/28/20 1129 (!) 164/79     Pulse Rate 08/28/20 1129 (!) 103      Resp 08/28/20 1129 19     Temp 08/28/20 1129 (!) 103.2 F (39.6 C)     Temp Source 08/28/20 1129 Oral     SpO2 08/28/20 1129 99 %     Weight 08/28/20 1134 162 lb 14.7 oz (73.9 kg)     Height 08/28/20 1134 5\' 10"  (1.778 m)     Head Circumference --      Peak Flow --      Pain Score --      Pain Loc --      Pain Edu? --  Excl. in [place]? --     Constitutional: Patient is somewhat confused but his wife who is here now reports he is at baseline Eyes: Conjunctivae are normal.  Head: Atraumatic. Nose: No congestion/rhinnorhea. Mouth/Throat: Mucous membranes are moist.  Oropharynx non-erythematous. Neck: No stridor.   Cardiovascular: Normal rate, regular rhythm. Grossly normal heart sounds.  Good peripheral circulation. Respiratory: Normal respiratory effort.  No retractions. Lungs CTAB. Gastrointestinal: Soft and nontender. No distention. No abdominal bruits.  Musculoskeletal: No lower extremity tenderness nor edema.   Neurologic: Slightly slow but otherwise normal speech and language. No gross focal neurologic deficits are appreciated.  Skin:  Skin is warm, dry and intact. No rash noted.   ____________________________________________   LABS (all labs ordered are listed, but only abnormal results are displayed)  Labs Reviewed  COMPREHENSIVE METABOLIC PANEL - Abnormal; Notable for the following components:      Result Value   Glucose, Bld 113 (*)    Creatinine, Ser 1.38 (*)    Total Bilirubin 1.8 (*)    GFR, Estimated 49 (*)    All other components within normal limits  CBC WITH DIFFERENTIAL/PLATELET - Abnormal; Notable for the following components:   Hemoglobin 12.0 (*)    HCT 38.0 (*)    Neutro Abs 8.9 (*)    All other components within normal limits  URINALYSIS, COMPLETE (UACMP) WITH MICROSCOPIC - Abnormal; Notable for the following components:   Color, Urine RED (*)    APPearance CLOUDY (*)    Hgb urine dipstick LARGE (*)    Ketones, ur 5 (*)    Protein, ur 100 (*)     Leukocytes,Ua LARGE (*)    RBC / HPF >50 (*)    WBC, UA >50 (*)    Bacteria, UA MANY (*)    All other components within normal limits  BLOOD GAS, VENOUS - Abnormal; Notable for the following components:   pCO2, Ven 37 (*)    All other components within normal limits  RESP PANEL BY RT-PCR (FLU A&B, COVID) ARPGX2  URINE CULTURE  CULTURE, BLOOD (ROUTINE X 2)  CULTURE, BLOOD (ROUTINE X 2)  CULTURE, BLOOD (SINGLE)  LACTIC ACID, PLASMA  PROTIME-INR  APTT  LACTIC ACID, PLASMA  TROPONIN I (HIGH SENSITIVITY)  TROPONIN I (HIGH SENSITIVITY)   ____________________________________________  EKG  EKG read interpreted by me shows sinus tachycardia rate of 101 left axis some slight ST depression in V6 and T wave inversion in aVL these are similar to EKG from the end of May. ____________________________________________  RADIOLOGY Gertha Calkin, personally viewed and evaluated these images (plain radiographs) as part of my medical decision making, as well as reviewing the written report by the radiologist.  ED MD interpretation chest x-ray reviewed by me does not show any acute changes  Official radiology report(s): DG Chest Portable 1 View  Result Date: 08/28/2020 CLINICAL DATA:  Fever and chest pain. EXAM: PORTABLE CHEST 1 VIEW COMPARISON:  05/19/2016 FINDINGS: The cardiac silhouette, mediastinal and hilar contours are within normal limits and stable. Stable tortuosity and calcification of the thoracic aorta. Surgical changes from coronary artery bypass surgery. No acute pulmonary findings. No infiltrates, edema or effusions. No pulmonary lesions. The bony thorax is intact. IMPRESSION: No acute cardiopulmonary findings. Electronically Signed   By: Marijo Sanes M.D.   On: 08/28/2020 12:31    ____________________________________________   PROCEDURES  Procedure(s) performed (including Critical Care): Critical care time half an hour this includes reviewing the old records examining the  patient reviewing the studies and speaking with his wife  Procedures   ____________________________________________   INITIAL IMPRESSION / ASSESSMENT AND PLAN / ED COURSE  As part of my medical decision making, I reviewed the following data within the [facility] records and chart review were reviewed here.  They show history of cerebrovascular accident and Alzheimer's dementia.  Patient also has a reported history of Parkinson's disease which is noted in the records that were reviewed from [place] on care everywhere.  His febrile he has sepsis orders put in the likely source is urine as he has many red and white blood cells in his urine.  Chest x-ray does not show any acute pathology.  There is no other apparent source for his infection and wife reports he is at his baseline mental status now.  I will discuss him with the hospitalist and have him admitted.              ____________________________________________   FINAL CLINICAL IMPRESSION(S) / ED DIAGNOSES  Final diagnoses:  Sepsis after obstetrical procedure  Urinary tract infection with hematuria, site unspecified     ED Discharge Orders     None        Note:  This document was prepared using Dragon voice recognition software and may include unintentional dictation errors.    Nena Polio, MD 08/28/20 [phone]

## 2020-08-28 NOTE — ED Notes (Signed)
Notified MD of frequent PVC.

## 2020-08-29 DIAGNOSIS — N3001 Acute cystitis with hematuria: Secondary | ICD-10-CM

## 2020-08-29 MED ORDER — SODIUM CHLORIDE 0.9 % IV SOLN
2.0000 g | INTRAVENOUS | Status: DC
  Administered 2020-08-29 – 2020-08-30 (×2): 2 g via INTRAVENOUS
  Filled 2020-08-29: qty 2
  Filled 2020-08-29: qty 20
  Filled 2020-08-29: qty 2

## 2020-08-29 MED ORDER — POLYETHYLENE GLYCOL 3350 17 G PO PACK: 17.0000 g | PACK | Freq: Every day | ORAL | Status: DC

## 2020-08-29 MED ORDER — SENNOSIDES-DOCUSATE SODIUM 8.6-50 MG PO TABS
2.0000 | ORAL_TABLET | Freq: Two times a day (BID) | ORAL | Status: DC
  Administered 2020-08-30: 2 via ORAL
  Filled 2020-08-29: qty 2

## 2020-08-29 MED ORDER — ALUM & MAG HYDROXIDE-SIMETH 200-200-20 MG/5ML PO SUSP: 30.0000 mL | ORAL | Status: DC | PRN

## 2020-08-29 MED ORDER — CHLORHEXIDINE GLUCONATE CLOTH 2 % EX PADS
6.0000 | MEDICATED_PAD | Freq: Every day | CUTANEOUS | Status: DC
  Administered 2020-08-30: 6 via TOPICAL

## 2020-08-29 NOTE — Clinical Social Work Note (Signed)
Patient is active with [facility] for PT and OT. Will keep agency liaison updated on plan of care.  Dayton Scrape, [place]

## 2020-08-29 NOTE — Progress Notes (Addendum)
[facility]) Hospital Liaison RN note  This patient is currently enrolled in [facility] outpatient based palliative care.  [facility] will continue to follow for discharge disposition.  Please call for any outpatient based palliative care related questions or concerns.  Thank you, Margaretmary Eddy, BSN, RN [facility] Hospital Liaison [phone]

## 2020-08-29 NOTE — Plan of Care (Signed)

## 2020-08-29 NOTE — Care Management Important Message (Signed)
Important Message  Patient Details  Name: Jason Cross. MRN: [id number] Date of Birth: 01/12/1929   Medicare Important Message Given:  N/A - LOS <3 / Initial given by admissions  Initial Medicare IM reviewed with Arther Dames, wife by Thornton Dales, Patient Access Associate on 08/29/2020 at 10:04am.   Dannette Barbara 08/29/2020, 7:00 PM

## 2020-08-29 NOTE — Progress Notes (Signed)
PT Cancellation Note  Patient Details Name: Jason Cross. MRN: [id number] DOB: 03-12-1928   Cancelled Treatment:    Reason Eval/Treat Not Completed: Other (comment) (Pt oriented to self only, mildly anxious, following <50% of simple commands. Pt not cognitively able to participate in PT eval in a meaningful capacity at present. Will attempt again at later date/time.)  3:17 PM, 08/29/20 Etta Grandchild, PT, DPT Physical Therapist - [facility]  [phone] ([place])   [place] 08/29/2020, 3:17 PM

## 2020-08-29 NOTE — Progress Notes (Addendum)
1       [place] at [place] NAME: Jason Cross    MR#:  [id number]  PCP: Venita Lick, NP  DATE OF BIRTH:  11-16-1928  SUBJECTIVE:  CHIEF COMPLAINT:   Chief Complaint  Patient presents with  . Chest Pain  . Fever  Not having any more fever but feeling very weak and lethargy.  Wife at bedside REVIEW OF SYSTEMS:  ROS unable to obtain due to his clinical condition/dementia and mental status DRUG ALLERGIES:   Allergies  Allergen Reactions  . Contrast Media [Iodinated Diagnostic Agents] Hives, Itching and Rash  . Codeine Nausea And Vomiting   VITALS:  Blood pressure 109/60, pulse 68, temperature 98.9 F (37.2 C), temperature source Oral, resp. rate 16, height 5\' 10"  (1.778 m), weight 73.9 kg, SpO2 96 %. PHYSICAL EXAMINATION:  Physical Exam 87 year old male lying in the bed comfortably without any acute distress Eyes pupils equal round reactive to light and accommodation, no scleral icterus Lungs clear to auscultation bilaterally no wheezing rales rhonchi or crepitation Cardiovascular S1-S2 normal.  No murmur rales or gallop Abdomen soft, benign Neuro Sleepy and lethargic so difficult to evaluate but moving all his extremities voluntarily, nonfocal Skin no rash or lesion LABORATORY PANEL:  Male CBC Recent Labs  Lab 08/28/20 1140  WBC 9.8  HGB 12.0*  HCT 38.0*  PLT 211   ------------------------------------------------------------------------------------------------------------------ Chemistries  Recent Labs  Lab 08/28/20 1140 08/28/20 1828  NA 139  --   K 4.0  --   CL 109  --   CO2 24  --   GLUCOSE 113*  --   BUN 22  --   CREATININE 1.38*  --   CALCIUM 8.9  --   MG  --  1.8  AST 18  --   ALT 16  --   ALKPHOS 89  --   BILITOT 1.8*  --    RADIOLOGY:  CT Renal Stone Study  Result Date: 08/28/2020 CLINICAL DATA:  Hematuria, unknown cause. Corroboration of patient history limited by patient dementia. EXAM: CT ABDOMEN AND  PELVIS WITHOUT CONTRAST TECHNIQUE: Multidetector CT imaging of the abdomen and pelvis was performed following the standard protocol without IV contrast. COMPARISON:  CT 09/09/2005 and 12/19/2012. Abdominal MRI 12/26/2014. FINDINGS: Lower chest: New patchy dependent opacity at the right lung base which may reflect atelectasis, scarring or an infiltrate. No significant pleural or pericardial effusion. Extensive coronary artery atherosclerosis post median sternotomy. Hepatobiliary: Previously demonstrated small a patent lesions are not well seen on this noncontrast study. The gallbladder is mildly distended without wall thickening or surrounding inflammation. No significant biliary dilatation. Pancreas: Unremarkable. No pancreatic ductal dilatation or surrounding inflammatory changes. Spleen: Normal in size without focal abnormality. Adrenals/Urinary Tract: Both adrenal glands appear normal. Probable single nonobstructing calculus in the interpolar region of the right kidney, measuring 4 mm on image 35/2. No evidence of ureteral calculus or hydronephrosis. Multiple renal cysts are again noted bilaterally, including a mildly hyperdense lesion in the lower pole of the left kidney measuring up to 6.7 cm on image 48/2, similar to previous CT. There is another complex, partially calcified cystic lesion more posteriorly in the lower pole of the left kidney which also appears unchanged. The bladder is heavily trabeculated with multiple diverticula. Stomach/Bowel: No enteric contrast administered. The stomach appears unremarkable for its degree of distension. No evidence of bowel wall thickening, distention or surrounding inflammatory change. The appendix appears normal. There is prominent stool in the sigmoid  colon and rectum. Vascular/Lymphatic: There are no enlarged abdominal or pelvic lymph nodes. Aortic and branch vessel atherosclerosis. 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 Other: Mildly prominent  fat in both inguinal canals. No ascites or free air. Musculoskeletal: No acute or significant osseous findings. Multilevel thoracolumbar spondylosis status post L3 through L5 fusion. Stable small sclerotic lesions in the right iliac bone and ischium. IMPRESSION: 1. Single nonobstructing right renal calculus. No evidence of ureteral calculus or hydronephrosis. 2. Heavily trabeculated urinary bladder with multiple diverticula consistent with chronic bladder outlet obstruction. Previous trans urethral resection of prostate gland. 3. Multiple simple and mildly complex bilateral renal cysts, similar to previous study. 4. Prominent stool in the distal colon. 5. New patchy airspace disease at the right lung base which may reflect atelectasis, scarring or infiltrate. 6.  Aortic Atherosclerosis (ICD10-I70.0). Electronically Signed   By: Richardean Sale M.D.   On: 08/28/2020 17:50   ASSESSMENT AND PLAN:  87 year old male with known history of dementia, Parkinson's disease, neuropathy, CAD admitted for sepsis due to UTI.  He had shaking chills and found to have a high fever Active Problems:   Neuropathy   Dementia due to Parkinson's disease without behavioral disturbance (HCC)   Acute cystitis  1.  Clinical sepsis present on admission with fever and tachycardia and acute cystitis with hematuria.  Acute metabolic encephalopathy likely secondary to sepsis.   -CT renal showed single nonobstructing right-sided renal calculus.  No hydronephrosis.  Findings suggestive of chronic bladder outlet obstruction with a history of TURP -Increase dose of Rocephin  -Pending urine culture -BC ID growing 1/4 Staph epidermidis likely contaminant  2.  History of CAD on aspirin. 3.  BPH with urinary frequency -continue Flomax. 4.  Dementia on Aricept 5.  Neuropathy on gabapentin 6.  Parkinson's disease not on any medications for this. 7.  Weakness.  Physical therapy evaluation pending 8.  Constipation: We will start bowel  regimen  Patient is followed by outpatient palliative  Body mass index is 23.38 kg/m.  Net IO Since Admission: 993.22 mL [08/29/20 1309]      Status is: Inpatient  Remains inpatient appropriate because:Hemodynamically unstable  Dispo: The patient is from: Home              Anticipated d/c is to: Home              Patient currently is not medically stable to d/c.   Difficult to place patient No    DVT prophylaxis:       enoxaparin (LOVENOX) injection 40 mg Start: 08/28/20 2200     Family Communication: (Updated wife at bedside on 7/7)   All the records are reviewed and case discussed with Care Management/Social Worker. Management plans discussed with the patient, family and they are in agreement.  CODE STATUS: DNR Level of care: Med-Surg  TOTAL TIME TAKING CARE OF THIS PATIENT: 35 minutes.   More than 50% of the time was spent in counseling/coordination of care: YES  POSSIBLE D/C IN 1-2 DAYS, DEPENDING ON CLINICAL CONDITION.   Max Sane M.D on 08/29/2020 at 1:09 PM  [facility]   CC: Primary care physician; Venita Lick, NP  Note: This dictation was prepared with Dragon dictation along with smaller phrase technology. Any transcriptional errors that result from this process are unintentional.

## 2020-08-29 NOTE — Progress Notes (Signed)
PHARMACY - PHYSICIAN COMMUNICATION CRITICAL VALUE ALERT - BLOOD CULTURE IDENTIFICATION (BCID)  Jason Cross. is an 87 y.o. male who presented to [facility] on 08/28/2020 with a chief complaint of fever and tachycardia  Assessment:  BCID: 1/4 Staphylococcus epidermidis, no Mec A/C detected, currently being treated for a UTI  Name of physician (or Provider) ContactedManuella Ghazi, MD  Current antibiotics: ceftriaxone 1 gram IV every 24 hours  Changes to prescribed antibiotics recommended: change ceftriaxone dose to 2 grams IV every 24 hours   Results for orders placed or performed during the hospital encounter of 08/28/20  Blood Culture ID Panel (Reflexed) (Collected: 08/28/2020 11:43 AM)  Result Value Ref Range   Enterococcus faecalis NOT DETECTED NOT DETECTED   Enterococcus Faecium NOT DETECTED NOT DETECTED   Listeria monocytogenes NOT DETECTED NOT DETECTED   Staphylococcus species DETECTED (A) NOT DETECTED   Staphylococcus aureus (BCID) NOT DETECTED NOT DETECTED   Staphylococcus epidermidis DETECTED (A) NOT DETECTED   Staphylococcus lugdunensis NOT DETECTED NOT DETECTED   Streptococcus species NOT DETECTED NOT DETECTED   Streptococcus agalactiae NOT DETECTED NOT DETECTED   Streptococcus pneumoniae NOT DETECTED NOT DETECTED   Streptococcus pyogenes NOT DETECTED NOT DETECTED   A.calcoaceticus-baumannii NOT DETECTED NOT DETECTED   Bacteroides fragilis NOT DETECTED NOT DETECTED   Enterobacterales NOT DETECTED NOT DETECTED   Enterobacter cloacae complex NOT DETECTED NOT DETECTED   Escherichia coli NOT DETECTED NOT DETECTED   Klebsiella aerogenes NOT DETECTED NOT DETECTED   Klebsiella oxytoca NOT DETECTED NOT DETECTED   Klebsiella pneumoniae NOT DETECTED NOT DETECTED   Proteus species NOT DETECTED NOT DETECTED   Salmonella species NOT DETECTED NOT DETECTED   Serratia marcescens NOT DETECTED NOT DETECTED   Haemophilus influenzae NOT DETECTED NOT DETECTED   Neisseria meningitidis NOT  DETECTED NOT DETECTED   Pseudomonas aeruginosa NOT DETECTED NOT DETECTED   Stenotrophomonas maltophilia NOT DETECTED NOT DETECTED   Candida albicans NOT DETECTED NOT DETECTED   Candida auris NOT DETECTED NOT DETECTED   Candida glabrata NOT DETECTED NOT DETECTED   Candida krusei NOT DETECTED NOT DETECTED   Candida parapsilosis NOT DETECTED NOT DETECTED   Candida tropicalis NOT DETECTED NOT DETECTED   Cryptococcus neoformans/gattii NOT DETECTED NOT DETECTED   Methicillin resistance mecA/C NOT DETECTED NOT DETECTED    Dallie Piles 08/29/2020  7:56 AM

## 2020-08-29 NOTE — Progress Notes (Signed)
PT Cancellation Note  Patient Details Name: Jason Cross. MRN: [id number] DOB: 07-25-1928   Cancelled Treatment:    Reason Eval/Treat Not Completed: Fatigue/lethargy limiting ability to participate Pt declined treatment due to fatigue. PT to reassess as able.   [employer], SPT

## 2020-08-30 DIAGNOSIS — G20 Parkinson's disease: Secondary | ICD-10-CM

## 2020-08-30 DIAGNOSIS — N390 Urinary tract infection, site not specified: Secondary | ICD-10-CM

## 2020-08-30 DIAGNOSIS — R319 Hematuria, unspecified: Secondary | ICD-10-CM

## 2020-08-30 DIAGNOSIS — R338 Other retention of urine: Secondary | ICD-10-CM

## 2020-08-30 DIAGNOSIS — F0280 Dementia in other diseases classified elsewhere without behavioral disturbance: Secondary | ICD-10-CM

## 2020-08-30 LAB — BLOOD CULTURE ID PANEL (REFLEXED) - BCID2

## 2020-08-30 LAB — BASIC METABOLIC PANEL
Anion gap: 6 (ref 5–15)
BUN: 27 mg/dL — ABNORMAL HIGH (ref 8–23)
CO2: 21 mmol/L — ABNORMAL LOW (ref 22–32)
Calcium: 8.3 mg/dL — ABNORMAL LOW (ref 8.9–10.3)
Chloride: 108 mmol/L (ref 98–111)
Creatinine, Ser: 1.53 mg/dL — ABNORMAL HIGH (ref 0.61–1.24)
GFR, Estimated: 43 mL/min — ABNORMAL LOW (ref >60)
Glucose, Bld: 94 mg/dL (ref 70–99)
Potassium: 3.1 mmol/L — ABNORMAL LOW (ref 3.5–5.1)
Sodium: 135 mmol/L (ref 135–145)

## 2020-08-30 LAB — MAGNESIUM: Magnesium: 1.8 mg/dL (ref 1.7–2.4)

## 2020-08-30 LAB — CBC
HCT: 32.2 % — ABNORMAL LOW (ref 39.0–52.0)
Hemoglobin: 11 g/dL — ABNORMAL LOW (ref 13.0–17.0)
MCH: 29.8 pg (ref 26.0–34.0)
MCHC: 34.2 g/dL (ref 30.0–36.0)
MCV: 87.3 fL (ref 80.0–100.0)
Platelets: 165 10*3/uL (ref 150–400)
RBC: 3.69 MIL/uL — ABNORMAL LOW (ref 4.22–5.81)
RDW: 15.2 % (ref 11.5–15.5)
WBC: 11.7 10*3/uL — ABNORMAL HIGH (ref 4.0–10.5)
nRBC: 0 % (ref 0.0–0.2)

## 2020-08-30 MED ORDER — POTASSIUM CHLORIDE CRYS ER 20 MEQ PO TBCR
40.0000 meq | EXTENDED_RELEASE_TABLET | Freq: Once | ORAL | Status: AC
  Administered 2020-08-30: 40 meq via ORAL
  Filled 2020-08-30: qty 2

## 2020-08-30 MED ORDER — TAMSULOSIN HCL 0.4 MG PO CAPS: 0.4000 mg | ORAL_CAPSULE | Freq: Every day | ORAL | 0 refills | Status: DC

## 2020-08-30 MED ORDER — CEPHALEXIN 250 MG PO CAPS: 250.0000 mg | ORAL_CAPSULE | Freq: Four times a day (QID) | ORAL | 0 refills | Status: AC

## 2020-08-30 NOTE — Evaluation (Signed)
Occupational Therapy Evaluation Patient Details Name: Jason Cross. MRN: [id number] DOB: Sep 14, 1928 Today's Date: 08/30/2020    History of Present Illness Dee ScarboroJr is an 89yoM who comes to [facility] on 08/[facility]/20 c CP, fever., AMS. Pt admitted with cystitis. PMH: parkinson's disease, macular degeneration, dementia, BPH, neuropathy, back surgery.   Clinical Impression   Pt seen for OT evaluation this date in setting of acute hospitalization d/t cystitis. Pt's spouse reports that while he is confused at baseline, he is typically able to navigate their home for fxl mobility and requires some assistance for LB ADLs such as dressing. States taht he was otherwise able to perform his basic self care such as self feeding. Pt presents this date with decreased strength and balance and requires MIN/MOD A for bed mobility and transfers with RW for UE support. Pt transferred to chair and OT works with pt on self feeding with MIN/MOD verbal/tactile cues to negotiate items on the tray/table. Ed with pt's spouse re: importance of allowing him to do as much as he can for himself to preserve fxl strength and INDEP to highest attainable level. Pt spouse with good understanding. Left with all needs met and in reach and RN aware of session. Anticipate he will require HHOT to ensure safety with ADLs in home environment.     Follow Up Recommendations  Home health OT;Supervision/Assistance - 24 hour    Equipment Recommendations  3 in 1 bedside commode;Tub/shower seat;Other (comment) (2ww)    Recommendations for Other Services       Precautions / Restrictions Precautions Precautions: Fall Restrictions Weight Bearing Restrictions: No      Mobility Bed Mobility Overal bed mobility: Needs Assistance Bed Mobility: Supine to Sit     Supine to sit: Mod assist     General bed mobility comments: truncal weakness, does well with cues    Transfers Overall transfer level: Needs assistance Equipment  used: Rolling walker (2 wheeled) Transfers: Sit to/from Stand Sit to Stand: Min assist;From elevated surface         General transfer comment: MIN A from elevated surface, MOD A from lower    Balance Overall balance assessment: History of Falls;Needs assistance   Sitting balance-Leahy Scale: Fair       Standing balance-Leahy Scale: Poor                             ADL either performed or assessed with clinical judgement   ADL Overall ADL's : Needs assistance/impaired                                       General ADL Comments: MIN/MOD tactile/verbal cues and SETUP for seated UB ADLs including grooming and self feeding. Pt requries MOD A with bed mobility and MOD A with progress to MIN A with ADL transfers. Requires MOD A for LB ADLs including threading socks.     Vision   Additional Comments: macular degeneration at baseline     Perception     Praxis      Pertinent Vitals/Pain Pain Assessment: No/denies pain     Hand Dominance     Extremity/Trunk Assessment Upper Extremity Assessment Upper Extremity Assessment: Generalized weakness   Lower Extremity Assessment Lower Extremity Assessment: Generalized weakness       Communication Communication Communication: HOH   Cognition Arousal/Alertness: Awake/alert Behavior During Therapy: [facility]  for tasks assessed/performed Overall Cognitive Status: History of cognitive impairments - at baseline                                 General Comments: able to follow simple one step commands, HOH. Increased time to process, requires tactile cues to find items on the table as he has macualr degeneration at baseline   General Comments       Exercises Other Exercises Other Exercises: OT ed re: safety wtih ADLs/ADL trasnfers at home with pt and spouse as well as importance of allowing him to do what he can for himself to maintain strength and fxl indep.   Shoulder Instructions       Home Living Family/patient expects to be discharged to:: Private residence Living Arrangements: Alone Available Help at Discharge: Family (Wife, Son,10yoGranddaughter) Type of Home: House Home Access: Stairs to enter Technical brewer of Steps: 2 Entrance Stairs-Rails: Left;Right Home Layout: One level               [place] - single point;Bedside commode;Walker - 2 wheels   Additional Comments: wife estimates >12 falls in the past 6 months; (dementia and parkinsons)      Prior Functioning/Environment Level of Independence: Needs assistance  Gait / Transfers Assistance Needed: household distance AMB c RW (often getting in/out of bed); ADL's / Homemaking Assistance Needed: typically independent with ADL, occasionally needs help due to incontinence; Has macular degeneration which can limit dressing.            OT Problem List: Decreased strength;Decreased activity tolerance;Impaired balance (sitting and/or standing);Decreased safety awareness      OT Treatment/Interventions: Self-care/ADL training;Therapeutic activities;Therapeutic exercise    OT Goals(Current goals can be found in the care plan section) Acute Rehab OT Goals Patient Stated Goal: regain strength OT Goal Formulation: With family Time For Goal Achievement: 09/13/20 Potential to Achieve Goals: Fair ADL Goals Pt Will Perform Grooming: with min assist;sitting (with MIN verbal cues to use tactile sensation to locate basic ADL items on his tray to complete one grooming task) Pt Will Transfer to Toilet: with min guard assist;ambulating;bedside commode (with RW to take ~10-15 steps to [facility] to improve tolerance for fxl activity) Pt Will Perform Toileting - Clothing Manipulation and hygiene: with min assist;sit to/from stand  OT Frequency: Min 1X/week   Barriers to D/C:            Co-evaluation PT/OT/SLP Co-Evaluation/Treatment: Yes Reason for Co-Treatment: Complexity of the patient's  impairments (multi-system involvement);Necessary to address cognition/behavior during functional activity;To address functional/ADL transfers;For patient/therapist safety PT goals addressed during session: Mobility/safety with mobility;Balance OT goals addressed during session: ADL's and self-care;Proper use of Adaptive equipment and DME      AM-PAC OT "6 Clicks" Daily Activity     Outcome Measure Help from another person eating meals?: A Little Help from another person taking care of personal grooming?: A Little Help from another person toileting, which includes using toliet, bedpan, or urinal?: A Lot Help from another person bathing (including washing, rinsing, drying)?: A Lot Help from another person to put on and taking off regular upper body clothing?: A Little Help from another person to put on and taking off regular lower body clothing?: A Lot 6 Click Score: 15   End of Session Equipment Utilized During Treatment: Gait belt;Rolling walker Nurse Communication: Mobility status  Activity Tolerance: Patient tolerated treatment well Patient left: in chair;with call bell/phone  within reach  OT Visit Diagnosis: Unsteadiness on feet (R26.81);Muscle weakness (generalized) (M62.81)                Time: 6060-0459 OT Time Calculation (min): 22 min Charges:  OT General Charges $OT Visit: 1 Visit OT Evaluation $OT Eval Moderate Complexity: 1 Mod OT Treatments $Self Care/Home Management : 8-22 mins  Gerrianne Scale, MS, OTR/L ascom [phone] 08/30/20, 4:59 PM

## 2020-08-30 NOTE — Plan of Care (Signed)

## 2020-08-30 NOTE — Plan of Care (Signed)
  Problem: Education: Goal: Knowledge of General Education information will improve Description: Including pain rating scale, medication(s)/side effects and non-pharmacologic comfort measures 08/30/2020 1653 by Milinda Hirschfeld, RN Outcome: Adequate for Discharge 08/30/2020 0821 by Milinda Hirschfeld, RN Outcome: Progressing   Problem: Health Behavior/Discharge Planning: Goal: Ability to manage health-related needs will improve 08/30/2020 1653 by Milinda Hirschfeld, RN Outcome: Adequate for Discharge 08/30/2020 0821 by Milinda Hirschfeld, RN Outcome: Progressing   Problem: Clinical Measurements: Goal: Ability to maintain clinical measurements within normal limits will improve 08/30/2020 1653 by Milinda Hirschfeld, RN Outcome: Adequate for Discharge 08/30/2020 0821 by Milinda Hirschfeld, RN Outcome: Progressing Goal: Will remain free from infection 08/30/2020 1653 by Milinda Hirschfeld, RN Outcome: Adequate for Discharge 08/30/2020 0821 by Milinda Hirschfeld, RN Outcome: Progressing Goal: Diagnostic test results will improve 08/30/2020 1653 by Milinda Hirschfeld, RN Outcome: Adequate for Discharge 08/30/2020 0821 by Milinda Hirschfeld, RN Outcome: Progressing Goal: Respiratory complications will improve 08/30/2020 1653 by Milinda Hirschfeld, RN Outcome: Adequate for Discharge 08/30/2020 0821 by Milinda Hirschfeld, RN Outcome: Progressing Goal: Cardiovascular complication will be avoided 08/30/2020 1653 by Milinda Hirschfeld, RN Outcome: Adequate for Discharge 08/30/2020 0821 by Milinda Hirschfeld, RN Outcome: Progressing   Problem: Activity: Goal: Risk for activity intolerance will decrease 08/30/2020 1653 by Milinda Hirschfeld, RN Outcome: Adequate for Discharge 08/30/2020 0821 by Milinda Hirschfeld, RN Outcome: Progressing   Problem: Nutrition: Goal: Adequate nutrition will be maintained 08/30/2020 1653 by Milinda Hirschfeld, RN Outcome: Adequate for Discharge 08/30/2020 0821 by Milinda Hirschfeld, RN Outcome: Progressing   Problem: Coping: Goal: Level of anxiety  will decrease 08/30/2020 1653 by Milinda Hirschfeld, RN Outcome: Adequate for Discharge 08/30/2020 0821 by Milinda Hirschfeld, RN Outcome: Progressing   Problem: Elimination: Goal: Will not experience complications related to bowel motility 08/30/2020 1653 by Milinda Hirschfeld, RN Outcome: Adequate for Discharge 08/30/2020 0821 by Milinda Hirschfeld, RN Outcome: Progressing Goal: Will not experience complications related to urinary retention 08/30/2020 1653 by Milinda Hirschfeld, RN Outcome: Adequate for Discharge 08/30/2020 0821 by Milinda Hirschfeld, RN Outcome: Progressing   Problem: Pain Managment: Goal: General experience of comfort will improve 08/30/2020 1653 by Milinda Hirschfeld, RN Outcome: Adequate for Discharge 08/30/2020 0821 by Milinda Hirschfeld, RN Outcome: Progressing   Problem: Safety: Goal: Ability to remain free from injury will improve 08/30/2020 1653 by Milinda Hirschfeld, RN Outcome: Adequate for Discharge 08/30/2020 0821 by Milinda Hirschfeld, RN Outcome: Progressing   Problem: Skin Integrity: Goal: Risk for impaired skin integrity will decrease 08/30/2020 1653 by Milinda Hirschfeld, RN Outcome: Adequate for Discharge 08/30/2020 0821 by Milinda Hirschfeld, RN Outcome: Progressing

## 2020-08-30 NOTE — TOC Transition Note (Addendum)
Transition of Care [facility]) - CM/SW Discharge Note   Patient Details  Name: Jason Cross. MRN: [id number] Date of Birth: 08-03-1928  [facility] of [facility] CM/SW Contact:  Magnus Ivan, LCSW Phone Number: 08/30/2020, 9:51 AM   Clinical Narrative:   Patient will discharge home today. Notified [place] with [facility]. Olivia Mackie with [facility] is aware. EMS paperwork completed and arranged for 3pm pick up with [facility]. Confirmed with patient's wife.    Final next level of care: [place] Barriers to Discharge: Barriers Resolved   Patient Goals and CMS Choice Patient states their goals for this hospitalization and ongoing recovery are:: home with palliative, home health      Discharge Placement                  Name of family member notified: spouse is aware Patient and family notified of of transfer: 08/30/20  Discharge Plan and Services                            [place]: [place] Date [place]: 08/30/20   Representative spoke with at [place]: [place]  Social Determinants of Health ([place]) Interventions     Readmission Risk Interventions No flowsheet data found.

## 2020-08-30 NOTE — Progress Notes (Signed)
OT Cancellation Note  Patient Details Name: Jason Cross. MRN: [id number] DOB: 01/20/1929   Cancelled Treatment:    Reason Eval/Treat Not Completed: Patient at procedure or test/ unavailable OT consult received and chart reviewed. Pt currently being seen by PT and CNA assisting pt with peri care bed level. In addition, upon speaking with TOC team, pt has plan to d/c today and is active with [facility] therapies. OT will attempt to f/u for evaluation today if time allows. Thank you.  Gerrianne Scale, [place], OTR/L ascom [phone] 08/30/20, 11:48 AM

## 2020-08-30 NOTE — Consult Note (Addendum)
Urology Consult  I have been asked to see the patient by Dr. Manuella Ghazi, for evaluation and management of urinary retention + UTI.  Chief Complaint: Fever, dark urine, AMS  History of Present Illness: Jason Cross. is a 87 y.o. year old male with PMH Parkinson's disease, dementia, CVA, OSA, and BPH s/p greenlight PVP in 2015 who presented to the ED on 08/28/2020 with reports of dark urine, fever, AMS, and transient chest pain.  Admission labs notable for creatinine 1.38 (baseline 1.5); lactate 1.8; WBC count 9.8; and UA with red color, >50 WBCs/hpf, >50 RBCs/hpf, many bacteria, and WBC clumps. Urine culture growing E coli, susceptibilities pending. Blood culture pending with Gram positive cocci in the aerobic bottle only.   He underwent CT stone study on admission which revealed a single, nonobstructing right renal calculus, heavily trabeculated and somewhat distended urinary bladder with multiple diverticula with prostate defect from previous procedure, and bilateral renal cysts, stable.  On hospitalization day 2, he was bladder scanned due to his history of bladder outlet obstruction.  Notably, he was reported to be incontinent of urine at this time. This was significantly elevated >831mL and he was I&O cathed for 1 L of thick, chocolate colored urine with heavy debris.  A 20 French two-way Foley catheter was subsequently placed for urinary decompression, he was started on Flomax, and is on antibiotics as below.  Foley catheter is in place today draining clear, pink-tinged urine without sediment, clots, or debris.  Today, patient is confused and wonders where he is, why he is in the hospital, where his wife is, and how long he will be hospitalized.  He is unable to provide significant history regarding his urinary symptoms.  On chart review, he was scheduled for a new patient appointment in our clinic in the coming weeks to discuss urinary frequency.  Anti-infectives (From admission, onward)     Start     Dose/Rate Route Frequency Ordered Stop   08/29/20 1000  cefTRIAXone (ROCEPHIN) 2 g in sodium chloride 0.9 % 100 mL IVPB        2 g 200 mL/hr over 30 Minutes Intravenous Every 24 hours 08/29/20 0817     08/28/20 2130  cefTRIAXone (ROCEPHIN) 1 g in sodium chloride 0.9 % 100 mL IVPB  Status:  Discontinued        1 g 200 mL/hr over 30 Minutes Intravenous Every 24 hours 08/28/20 1652 08/29/20 0817   08/28/20 1330  ceFEPIme (MAXIPIME) 1 g in sodium chloride 0.9 % 100 mL IVPB        1 g 200 mL/hr over 30 Minutes Intravenous  Once 08/28/20 1319 08/28/20 1425       Past Medical History:  Diagnosis Date   Arthritis    Benign prostate hyperplasia    Cataracts, bilateral    Coarse tremors    L>R hands   COPD (chronic obstructive pulmonary disease) ([place])    first stages   Coronary artery disease    Dizziness    occasional   Gastric ulcer    in the 60's   GERD (gastroesophageal reflux disease)    takes Omeprazole every other day   Hard of hearing    doesn't wear hearing aids   History of colon polyps    History of kidney stones    still one on the left side, low   Hyperlipidemia    takes Simvastatin daily   Hypertension    stopped BP meds, BP under control  Macular degeneration    Myocardial infarction [facility]) 2009   Peripheral neuropathy    takes Gabapentin daily   Pneumonia    hx of in the 60's   Sleep apnea     Past Surgical History:  Procedure Laterality Date   BACK SURGERY  2007   COLONOSCOPY     CORONARY ARTERY BYPASS GRAFT  2009   LIMA to LAD, SVG to D1 07/2007   EYE SURGERY Bilateral 2004, 2005   implants   GREEN LIGHT LASER TURP (TRANSURETHRAL RESECTION OF PROSTATE N/A 05/10/2013   Procedure: GREEN LIGHT LASER TURP (TRANSURETHRAL RESECTION OF PROSTATE;  Surgeon: Ardis Hughs, MD;  Location: [facility];  Service: Urology;  Laterality: N/A;   lipoma removed     chest wall   microthermo prostate  2009   [place] SURGERY  10/14    THORACIC DISCECTOMY N/A 03/10/2013   Procedure: Thoracic twelve-Lumbar one Laminectomy;  Surgeon: Erline Levine, MD;  Location: [facility];  Service: Neurosurgery;  Laterality: N/A;  Thoracic twelve-Lumbar one Laminectomy   TONSILLECTOMY     at age 63    Home Medications:  Current Meds  Medication Sig   acetaminophen (TYLENOL) 500 MG tablet Take 500 mg by mouth every 6 (six) hours as needed.   aspirin EC 81 MG tablet Take 81 mg by mouth every morning.   donepezil (ARICEPT) 10 MG tablet Take 1 tablet (10 mg total) by mouth at bedtime.   gabapentin (NEURONTIN) 300 MG capsule Take 300 mg by mouth 3 (three) times daily.   losartan (COZAAR) 25 MG tablet Take 25 mg by mouth daily.   melatonin 1 MG TABS tablet Take 1 tablet (1 mg total) by mouth at bedtime as needed.   Multiple Vitamins-Minerals (PRESERVISION AREDS 2 PO) Take 2 tablets by mouth daily.   omeprazole (PRILOSEC) 40 MG capsule Take 40 mg by mouth daily.    Allergies:  Allergies  Allergen Reactions   Contrast Media [Iodinated Diagnostic Agents] Hives, Itching and Rash   Codeine Nausea And Vomiting    Family History  Problem Relation Age of Onset   Hip fracture Mother    Heart disease Father    Hypertension Father    Heart attack Father    Thyroid disease Father    Cancer Sister        breast   Breast cancer Sister     Social History:  reports that he quit smoking about 27 years ago. His smoking use included cigarettes. He has never used smokeless tobacco. He reports that he does not drink alcohol and does not use drugs.  ROS: A complete review of systems was performed.  All systems are negative except for pertinent findings as noted.  Physical Exam:  Vital signs in last 24 hours: Temp:  [98.9 F (37.2 C)-99.8 F (37.7 C)] 98.9 F (37.2 C) (07/08 0729) Pulse Rate:  [68-92] 78 (07/08 0729) Resp:  [16-18] 18 (07/08 0729) BP: (109-147)/(59-79) 135/72 (07/08 0729) SpO2:  [94 %-97 %] 97 % (07/08  0729) Constitutional:  Alert, no acute distress HEENT: [place] AT, moist mucus membranes, slurred speech Cardiovascular: No clubbing, cyanosis, or edema Respiratory: Normal respiratory effort GU: Uncircumcised penis, foreskin retracted behind the glans with mild edema noted Skin: No rashes, bruises or suspicious lesions Neurologic: Grossly intact, no focal deficits, moving all 4 extremities Psychiatric: Normal mood and affect  Laboratory Data:  Recent Labs    08/28/20 1140 08/30/20 0422  WBC 9.8  11.7*  HGB 12.0* 11.0*  HCT 38.0* 32.2*   Recent Labs    08/28/20 1140 08/30/20 0422  NA 139 135  K 4.0 3.1*  CL 109 108  CO2 24 21*  GLUCOSE 113* 94  BUN 22 27*  CREATININE 1.38* 1.53*  CALCIUM 8.9 8.3*   Recent Labs    08/28/20 1140  INR 1.1   Urinalysis    Component Value Date/Time   COLORURINE RED (A) 08/28/2020 1149   APPEARANCEUR CLOUDY (A) 08/28/2020 1149   APPEARANCEUR Clear 06/28/2020 1532   LABSPEC 1.013 08/28/2020 1149   PHURINE 5.0 08/28/2020 1149   GLUCOSEU NEGATIVE 08/28/2020 1149   HGBUR LARGE (A) 08/28/2020 1149   BILIRUBINUR NEGATIVE 08/28/2020 1149   BILIRUBINUR Negative 06/28/2020 1532   KETONESUR 5 (A) 08/28/2020 1149   PROTEINUR 100 (A) 08/28/2020 1149   UROBILINOGEN 0.2 03/26/2013 1331   NITRITE NEGATIVE 08/28/2020 1149   LEUKOCYTESUR LARGE (A) 08/28/2020 1149   Results for orders placed or performed during the hospital encounter of 08/28/20  Resp Panel by RT-PCR (Flu A&B, Covid) Nasopharyngeal Swab     Status: None   Collection Time: 08/28/20 11:40 AM   Specimen: Nasopharyngeal Swab; Nasopharyngeal(NP) swabs in vial transport medium  Result Value Ref Range Status   SARS Coronavirus 2 by RT PCR NEGATIVE NEGATIVE Final    Comment: (NOTE) SARS-CoV-2 target nucleic acids are NOT DETECTED.  The SARS-CoV-2 RNA is generally detectable in upper respiratory specimens during the acute phase of infection. The lowest concentration of SARS-CoV-2 viral  copies this assay can detect is 138 copies/mL. A negative result does not preclude SARS-Cov-2 infection and should not be used as the sole basis for treatment or other patient management decisions. A negative result may occur with  improper specimen collection/handling, submission of specimen other than nasopharyngeal swab, presence of viral mutation(s) within the areas targeted by this assay, and inadequate number of viral copies(<138 copies/mL). A negative result must be combined with clinical observations, patient history, and epidemiological information. The expected result is Negative.  Fact Sheet for Patients:  EntrepreneurPulse.com.au  Fact Sheet for Healthcare Providers:  IncredibleEmployment.be  This test is no t yet approved or cleared by the [place] FDA and  has been authorized for detection and/or diagnosis of SARS-CoV-2 by FDA under an Emergency Use Authorization (EUA). This EUA will remain  in effect (meaning this test can be used) for the duration of the COVID-19 declaration under Section 564(b)(1) of the Act, 21 U.S.C.section 360bbb-3(b)(1), unless the authorization is terminated  or revoked sooner.       Influenza A by PCR NEGATIVE NEGATIVE Final   Influenza B by PCR NEGATIVE NEGATIVE Final    Comment: (NOTE) The Xpert Xpress SARS-CoV-2/FLU/RSV plus assay is intended as an aid in the diagnosis of influenza from Nasopharyngeal swab specimens and should not be used as a sole basis for treatment. Nasal washings and aspirates are unacceptable for Xpert Xpress SARS-CoV-2/FLU/RSV testing.  Fact Sheet for Patients: EntrepreneurPulse.com.au  Fact Sheet for Healthcare Providers: IncredibleEmployment.be  This test is not yet approved or cleared by the [place] FDA and has been authorized for detection and/or diagnosis of SARS-CoV-2 by FDA under an Emergency Use Authorization (EUA). This  EUA will remain in effect (meaning this test can be used) for the duration of the COVID-19 declaration under Section 564(b)(1) of the Act, 21 U.S.C. section 360bbb-3(b)(1), unless the authorization is terminated or revoked.  Performed at [facility], [place], [place]  [address]   Culture, blood (routine x 2)     Status: None (Preliminary result)   Collection Time: 08/28/20 11:43 AM   Specimen: BLOOD  Result Value Ref Range Status   Specimen Description BLOOD  RIGHT [facility]  Final   Special Requests   Final    BOTTLES DRAWN AEROBIC AND ANAEROBIC Blood Culture adequate volume   Culture  Setup Time   Final    Organism ID to follow GRAM POSITIVE COCCI AEROBIC BOTTLE ONLY CRITICAL RESULT CALLED TO, READ BACK BY AND VERIFIED WITH: DEVIN MITCHELL 08/29/20 @ 0734 BY S BEARD Performed at [facility], [address] [place], [place] [address]    Culture GRAM POSITIVE COCCI  Final   Report Status PENDING  Incomplete  Blood Culture ID Panel (Reflexed)     Status: Abnormal   Collection Time: 08/28/20 11:43 AM  Result Value Ref Range Status   Enterococcus faecalis NOT DETECTED NOT DETECTED Final   Enterococcus Faecium NOT DETECTED NOT DETECTED Final   Listeria monocytogenes NOT DETECTED NOT DETECTED Final   Staphylococcus species DETECTED (A) NOT DETECTED Final    Comment: CRITICAL RESULT CALLED TO, READ BACK BY AND VERIFIED WITH: DEVIN MITCHELL 08/29/20 @ 0734 BY S BEARD    Staphylococcus aureus (BCID) NOT DETECTED NOT DETECTED Final   Staphylococcus epidermidis DETECTED (A) NOT DETECTED Final    Comment: CRITICAL RESULT CALLED TO, READ BACK BY AND VERIFIED WITH: DEVIN MITCHELL 08/29/20 @ 0734 BY S BEARD    Staphylococcus lugdunensis NOT DETECTED NOT DETECTED Final   Streptococcus species NOT DETECTED NOT DETECTED Final   Streptococcus agalactiae NOT DETECTED NOT DETECTED Final   Streptococcus pneumoniae NOT DETECTED NOT DETECTED Final   Streptococcus  pyogenes NOT DETECTED NOT DETECTED Final   A.calcoaceticus-baumannii NOT DETECTED NOT DETECTED Final   Bacteroides fragilis NOT DETECTED NOT DETECTED Final   Enterobacterales NOT DETECTED NOT DETECTED Final   Enterobacter cloacae complex NOT DETECTED NOT DETECTED Final   Escherichia coli NOT DETECTED NOT DETECTED Final   Klebsiella aerogenes NOT DETECTED NOT DETECTED Final   Klebsiella oxytoca NOT DETECTED NOT DETECTED Final   Klebsiella pneumoniae NOT DETECTED NOT DETECTED Final   Proteus species NOT DETECTED NOT DETECTED Final   Salmonella species NOT DETECTED NOT DETECTED Final   Serratia marcescens NOT DETECTED NOT DETECTED Final   Haemophilus influenzae NOT DETECTED NOT DETECTED Final   Neisseria meningitidis NOT DETECTED NOT DETECTED Final   Pseudomonas aeruginosa NOT DETECTED NOT DETECTED Final   Stenotrophomonas maltophilia NOT DETECTED NOT DETECTED Final   Candida albicans NOT DETECTED NOT DETECTED Final   Candida auris NOT DETECTED NOT DETECTED Final   Candida glabrata NOT DETECTED NOT DETECTED Final   Candida krusei NOT DETECTED NOT DETECTED Final   Candida parapsilosis NOT DETECTED NOT DETECTED Final   Candida tropicalis NOT DETECTED NOT DETECTED Final   Cryptococcus neoformans/gattii NOT DETECTED NOT DETECTED Final   Methicillin resistance mecA/C NOT DETECTED NOT DETECTED Final    Comment: Performed at [facility], [address] [place], [place] [address]  Urine culture     Status: Abnormal (Preliminary result)   Collection Time: 08/28/20 11:49 AM   Specimen: Urine, Random  Result Value Ref Range Status   Specimen Description   Final    URINE, RANDOM Performed at [facility], [address] [place], [place] [address]    Special Requests   Final    NONE Performed at [facility], [address] [place],  [place] [address]    Culture >=100,000 COLONIES/mL ESCHERICHIA COLI (A)  Final   Report Status PENDING  Incomplete  Blood culture  (routine single)     Status: None (Preliminary result)   Collection Time: 08/28/20  6:26 PM   Specimen: BLOOD  Result Value Ref Range Status   Specimen Description BLOOD BLOOD RIGHT HAND  Final   Special Requests   Final    BOTTLES DRAWN AEROBIC AND ANAEROBIC Blood Culture results may not be optimal due to an inadequate volume of blood received in culture bottles   Culture   Final    NO GROWTH 2 DAYS Performed at [facility], [address], [place], [place] [address]    Report Status PENDING  Incomplete  Culture, blood (routine x 2)     Status: None (Preliminary result)   Collection Time: 08/28/20  6:27 PM   Specimen: BLOOD LEFT HAND  Result Value Ref Range Status   Specimen Description BLOOD LEFT HAND BLOOD LEFT HAND  Final   Special Requests   Final    BOTTLES DRAWN AEROBIC AND ANAEROBIC Blood Culture results may not be optimal due to an inadequate volume of blood received in culture bottles   Culture   Final    NO GROWTH 2 DAYS Performed at [facility], [address], [place], [place] [address]    Report Status PENDING  Incomplete    Radiologic Imaging: CT Renal Stone Study  Result Date: 08/28/2020 CLINICAL DATA:  Hematuria, unknown cause. Corroboration of patient history limited by patient dementia. EXAM: CT ABDOMEN AND PELVIS WITHOUT CONTRAST TECHNIQUE: Multidetector CT imaging of the abdomen and pelvis was performed following the standard protocol without IV contrast. COMPARISON:  CT 09/09/2005 and 12/19/2012. Abdominal MRI 12/26/2014. FINDINGS: Lower chest: New patchy dependent opacity at the right lung base which may reflect atelectasis, scarring or an infiltrate. No significant pleural or pericardial effusion. Extensive coronary artery atherosclerosis post median sternotomy. Hepatobiliary: Previously demonstrated small a patent lesions are not well seen on this noncontrast study. The gallbladder is mildly distended without wall thickening or surrounding  inflammation. No significant biliary dilatation. Pancreas: Unremarkable. No pancreatic ductal dilatation or surrounding inflammatory changes. Spleen: Normal in size without focal abnormality. Adrenals/Urinary Tract: Both adrenal glands appear normal. Probable single nonobstructing calculus in the interpolar region of the right kidney, measuring 4 mm on image 35/2. No evidence of ureteral calculus or hydronephrosis. Multiple renal cysts are again noted bilaterally, including a mildly hyperdense lesion in the lower pole of the left kidney measuring up to 6.7 cm on image 48/2, similar to previous CT. There is another complex, partially calcified cystic lesion more posteriorly in the lower pole of the left kidney which also appears unchanged. The bladder is heavily trabeculated with multiple diverticula. Stomach/Bowel: No enteric contrast administered. The stomach appears unremarkable for its degree of distension. No evidence of bowel wall thickening, distention or surrounding inflammatory change. The appendix appears normal. There is prominent stool in the sigmoid colon and rectum. Vascular/Lymphatic: There are no enlarged abdominal or pelvic lymph nodes. Aortic and branch vessel atherosclerosis. 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 Other: Mildly prominent fat in both inguinal canals. No ascites or free air. Musculoskeletal: No acute or significant osseous findings. Multilevel thoracolumbar spondylosis status post L3 through L5 fusion. Stable small sclerotic lesions in the right iliac bone and ischium. IMPRESSION: 1. Single nonobstructing right renal calculus. No evidence of ureteral calculus or hydronephrosis. 2. Heavily trabeculated urinary bladder with multiple diverticula  consistent with chronic bladder outlet obstruction. Previous trans urethral resection of prostate gland. 3. Multiple simple and mildly complex bilateral renal cysts, similar to previous study. 4. Prominent stool in the distal  colon. 5. New patchy airspace disease at the right lung base which may reflect atelectasis, scarring or infiltrate. 6.  Aortic Atherosclerosis (ICD10-I70.0). Electronically Signed   By: Richardean Sale M.D.   On: 08/28/2020 17:50    Assessment & Plan:  87 year old comorbid male with a history of dementia and Parkinson's disease who presents with urinary retention and febrile UTI with a history of greenlight PVP procedure in 2015 and imaging findings of chronic bladder outlet obstruction. Given pending referral to our clinic for frequency as well as urinary incontinence noted on admission, suspect retention is chronic and causing overflow incontinence.  Foley catheter in place draining well today.  I irrigated the catheter at the bedside this morning with 60 cc of sterile saline.  Catheter irrigated easily with no additional clearance of sediment or debris. Additionally, I reduced his edematous foreskin. Edema expected to clear on its own.  Recommendations: -Agree with Flomax, though recommend a low threshold to discontinue this with reports of orthostasis given his frailty -Continue Foley catheter, hand irrigate as needed, with plans for outpatient voiding trial in our clinic in approximately 2 weeks -Continue empiric antibiotics and follow urine cultures.  He will require a total of 7 to 10 days of culture appropriate therapy  Thank you for involving me in this patient's care, please page with any further questions or concerns.  Debroah Loop, PA-C 08/30/2020 8:29 AM

## 2020-08-30 NOTE — Evaluation (Signed)
Physical Therapy Evaluation Patient Details Name: Jason Cross. MRN: [id number] DOB: November 15, 1928 Today's Date: 08/30/2020   History of Present Illness  Jason Cross is an 89yoM who comes to [facility] on 08/28/20 c CP, fever., AMS. Pt admitted with cystitis. PMH: parkinson's disease, macular degeneration, dementia, BPH, neuropathy, back surgery.  Clinical Impression  Pt admitted with above diagnosis. Pt currently with functional limitations due to the deficits listed below (see "PT Problem List"). Upon entry, pt in bed, awake, being assisted with pericare cleanup by NA, Jason Cross is at bedside, provides details on home setup and PLOF of patient. The pt is alert, pleasant, interactive, follows simple commands ~75% of time. minA to EOB, minA to stand from elevated surface with RW, minGuardA for AMB in room, but minA required to recovery of 1 LOB. Pt is little weaker this date compared to baseline, but moreso has acute imbalance and impaiored awareness. Patient's performance this date reveals decreased ability, independence, and tolerance in performing all basic mobility required for performance of activities of daily living. Pt requires additional DME, close physical assistance, and cues for safe participate in mobility. Jason Cross educated on best ways to support the patient, pros and cons of HHPT v STR PT. Pt will benefit from skilled PT intervention to increase independence and safety with basic mobility in preparation for discharge to the venue listed below.       Follow Up Recommendations Home health PT;Supervision for mobility/OOB;Supervision - Intermittent    Equipment Recommendations  None recommended by PT    Recommendations for Other Services       Precautions / Restrictions Precautions Precautions: Fall Restrictions Weight Bearing Restrictions: No      Mobility  Bed Mobility Overal bed mobility: Needs Assistance Bed Mobility: Supine to Sit     Supine to sit: Min assist      General bed mobility comments: truncal weakness, does well with cues    Transfers Overall transfer level: Needs assistance Equipment used: Rolling walker (2 wheeled) Transfers: Sit to/from Stand Sit to Stand: Min assist;From elevated surface         General transfer comment: a little help to obtain full upright, cues for best hand placement  Ambulation/Gait Ambulation/Gait assistance: Min guard;Min assist Gait Distance (Feet): 40 Feet Assistive device: Rolling walker (2 wheeled)       General Gait Details: slow but steady, balanced with RW much of time, LOB in hall when turning around (requires minA to recover), difficulty followng cues in gait; tries to DC RW too soon when preparing to sit  Stairs            Wheelchair Mobility    Modified Rankin (Stroke Patients Only)       Balance Overall balance assessment: History of Falls;Needs assistance                                           Pertinent Vitals/Pain Pain Assessment: No/denies pain    Home Living Family/patient expects to be discharged to:: Private residence Living Arrangements: Alone Available Help at Discharge: Family (Jason Cross, Son,10yoGranddaughter) Type of Home: House Home Access: Stairs to enter Entrance Stairs-Rails: Chemical engineer of Steps: 2 Home Layout: One level Home Equipment: Cane - single point;Bedside commode;Walker - 2 wheels Additional Comments: Jason Cross estimates >12 falls in the past 6 months; (dementia and parkinsons)    Prior Function Level of Independence: Needs assistance  Gait / Transfers Assistance Needed: household distance AMB c RW (often getting in/out of bed);  ADL's / Homemaking Assistance Needed: typically independent with ADL, occasionally needs help due to incontinence; Has macular degeneration which can limit dressing.        Hand Dominance        Extremity/Trunk Assessment   Upper Extremity Assessment Upper Extremity  Assessment: Overall WFL for tasks assessed;Generalized weakness    Lower Extremity Assessment Lower Extremity Assessment: Generalized weakness;Overall WFL for tasks assessed       Communication      Cognition Arousal/Alertness: Awake/alert Behavior During Therapy: WFL for tasks assessed/performed Overall Cognitive Status: History of cognitive impairments - at baseline                                 General Comments: following simply commands, not anxoius as preior day. mildly impulsive, limited safety awareness at times.      General Comments      Exercises     Assessment/Plan    PT Assessment Patient needs continued PT services  PT Problem List Decreased strength;Decreased activity tolerance;Decreased balance;Decreased mobility;Decreased coordination;Decreased cognition;Decreased knowledge of use of DME;Decreased safety awareness;Decreased knowledge of precautions;Cardiopulmonary status limiting activity       PT Treatment Interventions DME instruction;Balance training;Gait training;Functional mobility training;Therapeutic activities;Stair training;Therapeutic exercise;Patient/family education    PT Goals (Current goals can be found in the Care Plan section)  Acute Rehab PT Goals Patient Stated Goal: regain strength PT Goal Formulation: With family Time For Goal Achievement: 08/30/20 Potential to Achieve Goals: Good    Frequency Min 2X/week   Barriers to discharge Inaccessible home environment      Co-evaluation PT/OT/SLP Co-Evaluation/Treatment: Yes Reason for Co-Treatment: Complexity of the patient's impairments (multi-system involvement);Necessary to address cognition/behavior during functional activity;For patient/therapist safety;To address functional/ADL transfers PT goals addressed during session: Mobility/safety with mobility;Balance OT goals addressed during session: Proper use of Adaptive equipment and DME;ADL's and self-care        AM-PAC PT "6 Clicks" Mobility  Outcome Measure Help needed turning from your back to your side while in a flat bed without using bedrails?: A Lot Help needed moving from lying on your back to sitting on the side of a flat bed without using bedrails?: A Lot Help needed moving to and from a bed to a chair (including a wheelchair)?: A Lot Help needed standing up from a chair using your arms (e.g., wheelchair or bedside chair)?: A Lot Help needed to walk in hospital room?: A Lot Help needed climbing 3-5 steps with a railing? : A Lot 6 Click Score: 12    End of Session Equipment Utilized During Treatment: Gait belt Activity Tolerance: Patient tolerated treatment well;No increased pain Patient left: in chair;with family/visitor present;with nursing/sitter in room;with call bell/phone within reach Nurse Communication: Mobility status PT Visit Diagnosis: Unsteadiness on feet (R26.81);Other abnormalities of gait and mobility (R26.89);Difficulty in walking, not elsewhere classified (R26.2);Muscle weakness (generalized) (M62.81);Other symptoms and signs involving the nervous system (R29.898);History of falling (Z91.81)    Time: 9562-1308 PT Time Calculation (min) (ACUTE ONLY): 44 min   Charges:   PT Evaluation $PT Eval Moderate Complexity: 1 Mod PT Treatments $Gait Training: 8-22 mins      12:43 PM, 08/30/20 Etta Grandchild, PT, DPT Physical Therapist - [facility]  [phone] ([place])    [place] C 08/30/2020, 12:39 PM

## 2020-08-31 LAB — URINE CULTURE: Culture: >=100000 — AB

## 2020-09-01 NOTE — Discharge Summary (Signed)
[place] [facility] [place] NAME: Jason Cross    MR#:  [id number]  DATE OF BIRTH:  07-25-1928  DATE OF ADMISSION:  08/28/2020   ADMITTING PHYSICIAN: Loletha Grayer, MD  DATE OF DISCHARGE: 08/30/2020  3:26 PM  PRIMARY CARE PHYSICIAN: Marnee Guarneri T, NP   ADMISSION DIAGNOSIS:  Acute cystitis [N30.00] Urinary tract infection with hematuria, site unspecified [N39.0, R31.9] Sepsis after obstetrical procedure [O86.04] DISCHARGE DIAGNOSIS:  Active Problems:   Neuropathy   Dementia due to Parkinson's disease without behavioral disturbance ([place])   Acute cystitis   Sepsis after obstetrical procedure   Urinary tract infection with hematuria  SECONDARY DIAGNOSIS:   Past Medical History:  Diagnosis Date  . Arthritis   . Benign prostate hyperplasia   . Cataracts, bilateral   . Coarse tremors    L>R hands  . COPD (chronic obstructive pulmonary disease) ([place])    first stages  . Coronary artery disease   . Dizziness    occasional  . Gastric ulcer    in the 60's  . GERD (gastroesophageal reflux disease)    takes Omeprazole every other day  . Hard of hearing    doesn't wear hearing aids  . History of colon polyps   . History of kidney stones    still one on the left side, low  . Hyperlipidemia    takes Simvastatin daily  . Hypertension    stopped BP meds, BP under control  . Macular degeneration   . Myocardial infarction ([place]) 2009  . Peripheral neuropathy    takes Gabapentin daily  . Pneumonia    hx of in the 60's  . Sleep apnea    HOSPITAL COURSE:  87 year old male with known history of dementia, Parkinson's disease, neuropathy, CAD admitted for sepsis due to UTI.  He had shaking chills and found to have a high fever  1.  Clinical sepsis present on admission with fever and tachycardia and acute cystitis with hematuria.  Acute metabolic encephalopathy likely secondary to sepsis.   -CT renal showed single nonobstructing right-sided renal  calculus.  No hydronephrosis.  Findings suggestive of chronic bladder outlet obstruction with a history of TURP - Treated with Rocephin while in the hospital and being discharged on oral Keflex to finish the course - Urine culture growing E. coli -BCID growing 1/4 Staph epidermidis likely contaminant   2.  History of CAD on aspirin. 3.  BPH/chronic bladder outlet obstruction with urinary frequency -continue Flomax.  Foley catheter was placed by urology and will need outpatient urology follow-up in 2 weeks for voiding trial 4.  Dementia on Aricept 5.  Neuropathy on gabapentin 6.  Parkinson's disease not on any medications  7.  Weakness.  Physical therapy recommends home health which was set up by TOC at discharge 8.  Constipation: Resolved with stool softener 9.  Hypokalemia: Repleted  Goals of care: Had a long discussion with wife at bedside.  He is already followed by palliative care as an outpatient.  He will likely be best cared by hospice if he qualifies.  I have shared this with [facility] to have evaluation at home for same.  Patient is a total care and will be best served under hospice.    DISCHARGE CONDITIONS:  Fair CONSULTS OBTAINED:  Treatment Team:  Billey Co, MD DRUG ALLERGIES:   Allergies  Allergen Reactions  . Contrast Media [Iodinated Diagnostic Agents] Hives, Itching and Rash  . Codeine Nausea  And Vomiting   DISCHARGE MEDICATIONS:   Allergies as of 08/30/2020       Reactions   Contrast Media [iodinated Diagnostic Agents] Hives, Itching, Rash   Codeine Nausea And Vomiting        Medication List     TAKE these medications    acetaminophen 500 MG tablet Commonly known as: TYLENOL Take 500 mg by mouth every 6 (six) hours as needed.   aspirin EC 81 MG tablet Take 81 mg by mouth every morning.   cephALEXin 250 MG capsule Commonly known as: KEFLEX Take 1 capsule (250 mg total) by mouth 4 (four) times daily for 7 days.   donepezil 10 MG  tablet Commonly known as: ARICEPT Take 1 tablet (10 mg total) by mouth at bedtime.   gabapentin 300 MG capsule Commonly known as: NEURONTIN Take 300 mg by mouth 3 (three) times daily.   losartan 25 MG tablet Commonly known as: COZAAR Take 25 mg by mouth daily.   melatonin 1 MG Tabs tablet Take 1 tablet (1 mg total) by mouth at bedtime as needed.   omeprazole 40 MG capsule Commonly known as: PRILOSEC Take 40 mg by mouth daily.   PRESERVISION AREDS 2 PO Take 2 tablets by mouth daily.   tamsulosin 0.4 MG Caps capsule Commonly known as: FLOMAX Take 1 capsule (0.4 mg total) by mouth daily after supper.       DISCHARGE INSTRUCTIONS:   DIET:  Cardiac diet DISCHARGE CONDITION:  Fair ACTIVITY:  Activity as tolerated OXYGEN:  Home Oxygen: No.  Oxygen Delivery: room air DISCHARGE LOCATION:  home with home health PT, OT and palliative care to follow.  Consider transition to hospice at home  If you experience worsening of your admission symptoms, develop shortness of breath, life threatening emergency, suicidal or homicidal thoughts you must seek medical attention immediately by calling 911 or calling your MD immediately  if symptoms less severe.  You Must read complete instructions/literature along with all the possible adverse reactions/side effects for all the Medicines you take and that have been prescribed to you. Take any new Medicines after you have completely understood and accpet all the possible adverse reactions/side effects.   Please note  You were cared for by a hospitalist during your hospital stay. If you have any questions about your discharge medications or the care you received while you were in the hospital after you are discharged, you can call the unit and asked to speak with the hospitalist on call if the hospitalist that took care of you is not available. Once you are discharged, your primary care physician will handle any further medical issues. Please note  that NO REFILLS for any discharge medications will be authorized once you are discharged, as it is imperative that you return to your primary care physician (or establish a relationship with a primary care physician if you do not have one) for your aftercare needs so that they can reassess your need for medications and monitor your lab values.    On the day of Discharge:  VITAL SIGNS:  Blood pressure 126/65, pulse 85, temperature 98.6 F (37 C), temperature source Axillary, resp. rate 18, height 5\' 10"  (1.778 m), weight 73.9 kg, SpO2 98 %. PHYSICAL EXAMINATION:  GENERAL:  87 y.o.-year-old patient lying in the bed with no acute distress.  EYES: Pupils equal, round, reactive to light and accommodation. No scleral icterus. Extraocular muscles intact.  HEENT: Head atraumatic, normocephalic. Oropharynx and nasopharynx clear.  NECK:  Supple, no  jugular venous distention. No thyroid enlargement, no tenderness.  LUNGS: Normal breath sounds bilaterally, no wheezing, rales,rhonchi or crepitation. No use of accessory muscles of respiration.  CARDIOVASCULAR: S1, S2 normal. No murmurs, rubs, or gallops.  ABDOMEN: Soft, benign NEUROLOGIC: Awake, nonfocal SKIN: No obvious rash, lesion, or ulcer.  DATA REVIEW:   CBC Recent Labs  Lab 08/30/20 0422  WBC 11.7*  HGB 11.0*  HCT 32.2*  PLT 165    Chemistries  Recent Labs  Lab 08/28/20 1140 08/28/20 1828 08/30/20 0422  NA 139  --  135  K 4.0  --  3.1*  CL 109  --  108  CO2 24  --  21*  GLUCOSE 113*  --  94  BUN 22  --  27*  CREATININE 1.38*  --  1.53*  CALCIUM 8.9  --  8.3*  MG  --    < > 1.8  AST 18  --   --   ALT 16  --   --   ALKPHOS 89  --   --   BILITOT 1.8*  --   --    < > = values in this interval not displayed.     Outpatient follow-up  Follow-up Information     [facility]. Schedule an appointment as soon as possible for a visit in 1 week(s).   Why: [facility] Discharge F/UP        Venita Lick, NP. Schedule an appointment as soon as possible for a visit in 1 week(s).   Specialty: Nurse Practitioner Why: [facility] Discharge F/UP Contact information: [place] [address] [phone]                 30 Day Unplanned Readmission Risk Score    Flowsheet Row ED to Hosp-Admission (Discharged) from 08/28/2020 in [facility]  30 Day Unplanned Readmission Risk Score (%) 17.3 Filed at 08/30/2020 1600       This score is the patient's risk of an unplanned readmission within 30 days of being discharged (0 -100%). The score is based on dignosis, age, lab data, medications, orders, and past utilization.   Low:  0-14.9   Medium: 15-21.9   High: 22-29.9   Extreme: 30 and above           Management plans discussed with the patient, family/wife and they are in agreement.  CODE STATUS: DNR  TOTAL TIME TAKING CARE OF THIS PATIENT: 45 minutes.    Max Sane M.D on 09/01/2020 at 2:57 PM  [facility]   CC: Primary care physician; Venita Lick, NP   Note: This dictation was prepared with Dragon dictation along with smaller phrase technology. Any transcriptional errors that result from this process are unintentional.

## 2020-09-02 ENCOUNTER — Telehealth: Payer: Self-pay

## 2020-09-02 ENCOUNTER — Telehealth: Payer: Self-pay | Admitting: Nurse Practitioner

## 2020-09-02 LAB — CULTURE, BLOOD (SINGLE): Culture: NO GROWTH

## 2020-09-02 LAB — CULTURE, BLOOD (ROUTINE X 2): Culture: NO GROWTH

## 2020-09-02 NOTE — Telephone Encounter (Signed)
Transition Care Management Follow-up Telephone Call Date of discharge and from where: 08/30/2020 [facility] How have you been since you were released from the hospital? Urine getting clearer. Pretty good. A little bit weak. Any questions or concerns? No  Items Reviewed: Did the pt receive and understand the discharge instructions provided? Yes  Medications obtained and verified? Yes  Other? No  Any new allergies since your discharge? No  Dietary orders reviewed? Yes Do you have support at home? Yes   Home Care and Equipment/Supplies: Were home health services ordered? yes If so, what is the name of the agency? N/a  Has the agency set up a time to come to the patient's home? no Were any new equipment or medical supplies ordered?  No What is the name of the medical supply agency? N/a Were you able to get the supplies/equipment? not applicable Do you have any questions related to the use of the equipment or supplies? No  Functional Questionnaire: (I = Independent and D = Dependent) ADLs: D  Bathing/Dressing- D  Meal Prep- D  Eating- D  Maintaining continence- D  Transferring/Ambulation- D  Managing Meds- D  Follow up appointments reviewed:  PCP Hospital f/u appt confirmed? Yes  Scheduled to see Marnee Guarneri NP on 09/06/2020 @ 4:00. Are transportation arrangements needed? No  If their condition worsens, is the pt aware to call PCP or go to the Emergency Dept.? Yes Was the patient provided with contact information for the PCP's office or ED? Yes Was to pt encouraged to call back with questions or concerns? Yes

## 2020-09-02 NOTE — Telephone Encounter (Signed)
Jason Cross  PT from [facility]  Phone # [phone]   PT verbal orders for 2x 3 1x 6

## 2020-09-02 NOTE — Telephone Encounter (Signed)
Ok for verbal 

## 2020-09-02 NOTE — Telephone Encounter (Signed)
Verbal order given  

## 2020-09-04 ENCOUNTER — Other Ambulatory Visit: Payer: Medicare HMO | Admitting: Primary Care

## 2020-09-04 ENCOUNTER — Other Ambulatory Visit: Payer: Self-pay

## 2020-09-04 LAB — CULTURE, BLOOD (ROUTINE X 2): Special Requests: ADEQUATE

## 2020-09-06 ENCOUNTER — Ambulatory Visit (INDEPENDENT_AMBULATORY_CARE_PROVIDER_SITE_OTHER): Payer: Medicare HMO | Admitting: Nurse Practitioner

## 2020-09-06 ENCOUNTER — Other Ambulatory Visit: Payer: Self-pay

## 2020-09-06 ENCOUNTER — Encounter: Payer: Self-pay | Admitting: Nurse Practitioner

## 2020-09-06 ENCOUNTER — Encounter: Payer: Self-pay | Admitting: Emergency Medicine

## 2020-09-06 VITALS — BP 68/42 | HR 73 | Temp 98.6°F | Wt 167.2 lb

## 2020-09-06 DIAGNOSIS — I2510 Atherosclerotic heart disease of native coronary artery without angina pectoris: Secondary | ICD-10-CM | POA: Insufficient documentation

## 2020-09-06 DIAGNOSIS — Z8616 Personal history of COVID-19: Secondary | ICD-10-CM | POA: Insufficient documentation

## 2020-09-06 DIAGNOSIS — R42 Dizziness and giddiness: Secondary | ICD-10-CM | POA: Insufficient documentation

## 2020-09-06 DIAGNOSIS — R0902 Hypoxemia: Secondary | ICD-10-CM | POA: Diagnosis not present

## 2020-09-06 DIAGNOSIS — F0280 Dementia in other diseases classified elsewhere without behavioral disturbance: Secondary | ICD-10-CM

## 2020-09-06 DIAGNOSIS — I9589 Other hypotension: Secondary | ICD-10-CM

## 2020-09-06 DIAGNOSIS — Z743 Need for continuous supervision: Secondary | ICD-10-CM | POA: Diagnosis not present

## 2020-09-06 DIAGNOSIS — J449 Chronic obstructive pulmonary disease, unspecified: Secondary | ICD-10-CM | POA: Diagnosis not present

## 2020-09-06 DIAGNOSIS — G3183 Dementia with Lewy bodies: Secondary | ICD-10-CM | POA: Insufficient documentation

## 2020-09-06 DIAGNOSIS — I129 Hypertensive chronic kidney disease with stage 1 through stage 4 chronic kidney disease, or unspecified chronic kidney disease: Secondary | ICD-10-CM | POA: Insufficient documentation

## 2020-09-06 DIAGNOSIS — N3001 Acute cystitis with hematuria: Secondary | ICD-10-CM | POA: Diagnosis not present

## 2020-09-06 DIAGNOSIS — N390 Urinary tract infection, site not specified: Secondary | ICD-10-CM | POA: Diagnosis not present

## 2020-09-06 DIAGNOSIS — A4150 Gram-negative sepsis, unspecified: Secondary | ICD-10-CM

## 2020-09-06 DIAGNOSIS — I959 Hypotension, unspecified: Secondary | ICD-10-CM | POA: Diagnosis not present

## 2020-09-06 DIAGNOSIS — Z87891 Personal history of nicotine dependence: Secondary | ICD-10-CM | POA: Insufficient documentation

## 2020-09-06 DIAGNOSIS — Z79899 Other long term (current) drug therapy: Secondary | ICD-10-CM | POA: Insufficient documentation

## 2020-09-06 DIAGNOSIS — R69 Illness, unspecified: Secondary | ICD-10-CM | POA: Diagnosis not present

## 2020-09-06 DIAGNOSIS — R339 Retention of urine, unspecified: Secondary | ICD-10-CM | POA: Diagnosis not present

## 2020-09-06 DIAGNOSIS — N183 Chronic kidney disease, stage 3 unspecified: Secondary | ICD-10-CM | POA: Diagnosis not present

## 2020-09-06 DIAGNOSIS — Z7982 Long term (current) use of aspirin: Secondary | ICD-10-CM | POA: Diagnosis not present

## 2020-09-06 DIAGNOSIS — G20 Parkinson's disease: Secondary | ICD-10-CM | POA: Diagnosis not present

## 2020-09-06 DIAGNOSIS — I4891 Unspecified atrial fibrillation: Secondary | ICD-10-CM | POA: Diagnosis not present

## 2020-09-06 DIAGNOSIS — Z951 Presence of aortocoronary bypass graft: Secondary | ICD-10-CM | POA: Diagnosis not present

## 2020-09-06 LAB — CBC
HCT: 34.9 % — ABNORMAL LOW (ref 39.0–52.0)
Hemoglobin: 11.2 g/dL — ABNORMAL LOW (ref 13.0–17.0)
MCH: 28.6 pg (ref 26.0–34.0)
MCHC: 32.1 g/dL (ref 30.0–36.0)
MCV: 89.3 fL (ref 80.0–100.0)
Platelets: 306 10*3/uL (ref 150–400)
RBC: 3.91 MIL/uL — ABNORMAL LOW (ref 4.22–5.81)
RDW: 15.2 % (ref 11.5–15.5)
WBC: 7.2 10*3/uL (ref 4.0–10.5)
nRBC: 0 % (ref 0.0–0.2)

## 2020-09-06 LAB — BASIC METABOLIC PANEL
Anion gap: 6 (ref 5–15)
BUN: 24 mg/dL — ABNORMAL HIGH (ref 8–23)
CO2: 28 mmol/L (ref 22–32)
Calcium: 8.7 mg/dL — ABNORMAL LOW (ref 8.9–10.3)
Chloride: 104 mmol/L (ref 98–111)
Creatinine, Ser: 1.45 mg/dL — ABNORMAL HIGH (ref 0.61–1.24)
GFR, Estimated: 46 mL/min — ABNORMAL LOW (ref >60)
Glucose, Bld: 96 mg/dL (ref 70–99)
Potassium: 4.7 mmol/L (ref 3.5–5.1)
Sodium: 138 mmol/L (ref 135–145)

## 2020-09-06 LAB — PROTIME-INR
INR: 1 (ref 0.8–1.2)
Prothrombin Time: 13.5 seconds (ref 11.4–15.2)

## 2020-09-06 NOTE — ED Triage Notes (Signed)
Pt in via EMS from MD office with low BP. EMS reports BP 103/80. Pt does not have any complaints other than he is weak.

## 2020-09-06 NOTE — Progress Notes (Signed)
BP (!) 68/42 (BP Location: Left Arm)   Pulse 73   Temp 98.6 F (37 C) (Oral)   Wt 167 lb 3.2 oz (75.8 kg)   BMI 23.99 kg/m    Subjective:    Patient ID: Jason Azucena Fallen., male    DOB: Jun 29, 1928, 87 y.o.   MRN: [id number]  HPI: Jason W Lenzy Kerschner. is a 87 y.o. male  Chief Complaint  Patient presents with   Hospitalization Follow-up    Patient wife states patient is still unstable. Patient wife states patient is not supposed to have catheter removed until Thursday the 21st, and patient wants to be able to do move things and wife wants to discuss possible a leg bag. She states patient still has some confusion not as bad since hospitalization. She also states PT hasn't been back to the home since he has been home.    Abdominal Pain    Patient still complaining of right side pain in lower side.    Transition of Care Hospital Follow up.  Presents for hospital follow-up, patient with progressive dementia and Parkinson's followed by neurology (last neuro visit 07/29/20) and palliative with last visit 08/20/20.  He remains a full code.  Was in hospital for sepsis due to UTI.  Today he reports feeling dizzy with position changes.  States he has been doing excellent though, as has a good wife who feeds him.  His wife presents with him and states he has been using walker more at home, PT recommended this last in home visit.  Has been getting a least 64 oz water in daily per wife.  Eating meals well.  Has had no falls since leaving hospital.  Wife reports she has noticed more sundowning since return from hospital.    Currently has foley catheter in due to retention in hospital and is to see urology on Thursday next week.     HOSPITAL NOTES: "87 year old male with known history of dementia, Parkinson's disease, neuropathy, CAD admitted for sepsis due to UTI.  He had shaking chills and found to have a high fever   1.  Clinical sepsis present on admission with fever and tachycardia and acute  cystitis with hematuria.  Acute metabolic encephalopathy likely secondary to sepsis.   -CT renal showed single nonobstructing right-sided renal calculus.  No hydronephrosis.  Findings suggestive of chronic bladder outlet obstruction with a history of TURP - Treated with Rocephin while in the hospital and being discharged on oral Keflex to finish the course - Urine culture growing E. coli -BCID growing 1/4 Staph epidermidis likely contaminant 2.  History of CAD on aspirin. 3.  BPH/chronic bladder outlet obstruction with urinary frequency -continue Flomax.  Foley catheter was placed by urology and will need outpatient urology follow-up in 2 weeks for voiding trial 4.  Dementia on Aricept 5.  Neuropathy on gabapentin 6.  Parkinson's disease not on any medications 7.  Weakness.  Physical therapy recommends home health which was set up by TOC at discharge 8.  Constipation: Resolved with stool softener 9.  Hypokalemia: Repleted   Goals of care: Had a long discussion with wife at bedside.  He is already followed by palliative care as an outpatient.  He will likely be best cared by hospice if he qualifies.  I have shared this with [facility] to have evaluation at home for same.  Patient is a total care and will be best served under hospice."  Hospital/Facility: [facility] D/C Physician: Dr. Manuella Ghazi D/C Date: 08/30/20  Records Requested: 09/06/20 Records Received: 09/06/20 Records Reviewed: 09/06/20  Diagnoses on Discharge:  Acute cystitis  Urinary tract infection with hematuria Sepsis due to UTI  Date of interactive Contact within 48 hours of discharge:  Contact was through: phone  Date of 7 day or 14 day face-to-face visit:    within 14 days  Outpatient Encounter Medications as of 09/06/2020  Medication Sig Note   acetaminophen (TYLENOL) 500 MG tablet Take 500 mg by mouth every 6 (six) hours as needed.    aspirin EC 81 MG tablet Take 81 mg by mouth every morning.    cephALEXin (KEFLEX) 250 MG  capsule Take 1 capsule (250 mg total) by mouth 4 (four) times daily for 7 days.    donepezil (ARICEPT) 10 MG tablet Take 1 tablet (10 mg total) by mouth at bedtime.    gabapentin (NEURONTIN) 300 MG capsule Take 300 mg by mouth 3 (three) times daily.    losartan (COZAAR) 25 MG tablet Take 25 mg by mouth daily.    melatonin 1 MG TABS tablet Take 1 tablet (1 mg total) by mouth at bedtime as needed.    Multiple Vitamins-Minerals (PRESERVISION AREDS 2 PO) Take 2 tablets by mouth daily. 08/28/2020: PATIENT TAKES ONE TABLET TWICE DAILY   omeprazole (PRILOSEC) 40 MG capsule Take 40 mg by mouth daily.    tamsulosin (FLOMAX) 0.4 MG CAPS capsule Take 1 capsule (0.4 mg total) by mouth daily after supper.    No facility-administered encounter medications on file as of 09/06/2020.    Diagnostic Tests Reviewed/Disposition: Reviewed in chart -- discharge K+ 3.1, CRT 1.53, GFR 43, WBC 11.7, H/H 11.0/32.2  Consults: Urology  Discharge Instructions: follow-up with PCP and urology  Disease/illness Education: Discussed with patient and wife  Home Health/Community Services Discussions/Referrals: PT  Establishment or re-establishment of referral orders for community resources: Palliative  Discussion with other health care providers: reviewed notes  Assessment and Support of treatment regimen adherence: Discussed with patient and wife  Appointments Coordinated with: Discussed with patient and wife  Education for self-management, independent living, and ADLs:  Discussed with patient and wife  Relevant past medical, surgical, family and social history reviewed and updated as indicated. Interim medical history since our last visit reviewed. Allergies and medications reviewed and updated.  Review of Systems  Constitutional:  Negative for activity change, diaphoresis, fatigue and fever.  Respiratory:  Negative for cough, chest tightness, shortness of breath and wheezing.   Cardiovascular:  Negative for chest  pain, palpitations and leg swelling.  Gastrointestinal: Negative.   Neurological:  Positive for dizziness and weakness. Negative for syncope, light-headedness, numbness and headaches.  Psychiatric/Behavioral: Negative.     Per HPI unless specifically indicated above     Objective:    BP (!) 68/42 (BP Location: Left Arm)   Pulse 73   Temp 98.6 F (37 C) (Oral)   Wt 167 lb 3.2 oz (75.8 kg)   BMI 23.99 kg/m   Wt Readings from Last 3 Encounters:  09/06/20 167 lb 3.2 oz (75.8 kg)  08/28/20 162 lb 14.7 oz (73.9 kg)  08/02/20 163 lb (73.9 kg)    Physical Exam Vitals and nursing note reviewed.  Constitutional:      General: He is awake. He is not in acute distress.    Appearance: He is well-developed, well-groomed and underweight. He is not ill-appearing or toxic-appearing.  HENT:     Head: Normocephalic and atraumatic.     Right Ear: Hearing normal. No drainage.  Left Ear: Hearing normal. No drainage.  Eyes:     General: Lids are normal.        Right eye: No discharge.        Left eye: No discharge.     Conjunctiva/sclera: Conjunctivae normal.     Pupils: Pupils are equal, round, and reactive to light.  Neck:     Thyroid: No thyromegaly.     Vascular: No carotid bruit.  Cardiovascular:     Rate and Rhythm: Normal rate and regular rhythm.     Heart sounds: Normal heart sounds, S1 normal and S2 normal. No murmur heard.   No gallop.  Pulmonary:     Effort: Pulmonary effort is normal. No accessory muscle usage or respiratory distress.     Breath sounds: Normal breath sounds.  Abdominal:     General: Bowel sounds are normal. There is no distension.     Palpations: Abdomen is soft. There is no hepatomegaly or splenomegaly.     Tenderness: There is no abdominal tenderness. There is no right CVA tenderness or left CVA tenderness.     Hernia: No hernia is present.  Genitourinary:    Comments: Foley catheter in place with slightly cloudy urine, yellow. Musculoskeletal:         General: Normal range of motion.     Cervical back: Normal range of motion and neck supple.     Right lower leg: 1+ Edema present.     Left lower leg: 1+ Edema present.  Skin:    General: Skin is warm and dry.     Capillary Refill: Capillary refill takes less than 2 seconds.     Comments: Scattered pale purple bruises to bilateral upper extremities.  Neurological:     Mental Status: He is alert.     Cranial Nerves: Cranial nerves are intact.     Motor: Weakness present.     Gait: Gait abnormal.     Deep Tendon Reflexes: Reflexes are normal and symmetric.     Reflex Scores:      Brachioradialis reflexes are 2+ on the right side and 2+ on the left side.      Patellar reflexes are 2+ on the right side and 2+ on the left side.    Comments: Pleasantly confused (dementia).  He is not oriented to month and year + location.  Mild cogwheel rigidity noted.  Very unsteady on feet, attempted to stand up with CMA and PCP, but very unsteady and unable to fully stand -- complained of dizziness.  Psychiatric:        Attention and Perception: Attention normal.        Mood and Affect: Mood normal.        Speech: Speech normal.        Behavior: Behavior normal. Behavior is cooperative.    Results for orders placed or performed during the hospital encounter of 08/28/20  Urine culture   Specimen: Urine, Random  Result Value Ref Range   Specimen Description      URINE, RANDOM Performed at [facility], [address] [place], [place] [address]    Special Requests      NONE Performed at [facility], [address] [place], [place] [address]    Culture >=100,000 COLONIES/mL ESCHERICHIA COLI (A)    Report Status 08/31/2020 FINAL    Organism ID, Bacteria ESCHERICHIA COLI (A)       Susceptibility   Escherichia coli - MIC*    AMPICILLIN <=2 SENSITIVE  Sensitive     CEFAZOLIN <=4 SENSITIVE Sensitive     CEFEPIME <=0.12 SENSITIVE Sensitive     CEFTRIAXONE <=0.25 SENSITIVE  Sensitive     CIPROFLOXACIN <=0.25 SENSITIVE Sensitive     GENTAMICIN <=1 SENSITIVE Sensitive     IMIPENEM <=0.25 SENSITIVE Sensitive     NITROFURANTOIN <=16 SENSITIVE Sensitive     TRIMETH/SULFA <=20 SENSITIVE Sensitive     AMPICILLIN/SULBACTAM <=2 SENSITIVE Sensitive     PIP/TAZO <=4 SENSITIVE Sensitive     * >=100,000 COLONIES/mL ESCHERICHIA COLI  Culture, blood (routine x 2)   Specimen: BLOOD  Result Value Ref Range   Specimen Description      BLOOD  RIGHT [facility] Performed at [facility], [address] [place], [place] [address]    Special Requests      BOTTLES DRAWN AEROBIC AND ANAEROBIC Blood Culture adequate volume Performed at [facility], [address] [place], [place] [address]    Culture  Setup Time      Organism ID to follow [place] TO, READ BACK BY AND VERIFIED WITH: DEVIN MITCHELL 08/29/20 @ 0734 BY S BEARD GRAM POSITIVE RODS ANAEROBIC BOTTLE ONLY Gram Stain Report Called to,Read Back By and Verified With: [facility] MITCHELL 09/02/20 @ 1100 BY S BEARD Performed at [facility], [address] [place], [place] [address]    Culture (A)     STAPHYLOCOCCUS EPIDERMIDIS THE SIGNIFICANCE OF ISOLATING THIS ORGANISM FROM A SINGLE SET OF BLOOD CULTURES WHEN MULTIPLE SETS ARE DRAWN IS UNCERTAIN. PLEASE NOTIFY THE MICROBIOLOGY DEPARTMENT WITHIN ONE WEEK IF SPECIATION AND SENSITIVITIES ARE REQUIRED. PROPIONIBACTERIUM ACNES Standardized susceptibility testing for this organism is not available. Performed at [facility], [place] [address], [place], [place] [address]    Report Status 09/04/2020 FINAL   Culture, blood (routine x 2)   Specimen: BLOOD LEFT HAND  Result Value Ref Range   Specimen Description BLOOD LEFT HAND BLOOD LEFT HAND    Special Requests      BOTTLES DRAWN AEROBIC AND ANAEROBIC Blood Culture results may not be optimal due to an inadequate volume of blood received in culture  bottles   Culture      NO GROWTH 5 DAYS Performed at [facility], [address], [place], [place] [address]    Report Status 09/02/2020 FINAL   Blood culture (routine single)   Specimen: BLOOD  Result Value Ref Range   Specimen Description BLOOD BLOOD RIGHT HAND    Special Requests      BOTTLES DRAWN AEROBIC AND ANAEROBIC Blood Culture results may not be optimal due to an inadequate volume of blood received in culture bottles   Culture      NO GROWTH 5 DAYS Performed at [facility], [place] [place],  [address]    Report Status 09/02/2020 FINAL   Resp Panel by RT-PCR (Flu A&B, Covid) Nasopharyngeal Swab   Specimen: Nasopharyngeal Swab; Nasopharyngeal(NP) swabs in vial transport medium  Result Value Ref Range   SARS Coronavirus 2 by RT PCR NEGATIVE NEGATIVE   Influenza A by PCR NEGATIVE NEGATIVE   Influenza B by PCR NEGATIVE NEGATIVE  Blood Culture ID Panel (Reflexed)  Result Value Ref Range   Enterococcus faecalis NOT DETECTED NOT DETECTED   Enterococcus Faecium NOT DETECTED NOT DETECTED   Listeria monocytogenes NOT DETECTED NOT DETECTED   Staphylococcus species DETECTED (A) NOT DETECTED   Staphylococcus aureus (BCID) NOT DETECTED NOT DETECTED  Staphylococcus epidermidis DETECTED (A) NOT DETECTED   Staphylococcus lugdunensis NOT DETECTED NOT DETECTED   Streptococcus species NOT DETECTED NOT DETECTED   Streptococcus agalactiae NOT DETECTED NOT DETECTED   Streptococcus pneumoniae NOT DETECTED NOT DETECTED   Streptococcus pyogenes NOT DETECTED NOT DETECTED   A.calcoaceticus-baumannii NOT DETECTED NOT DETECTED   Bacteroides fragilis NOT DETECTED NOT DETECTED   Enterobacterales NOT DETECTED NOT DETECTED   Enterobacter cloacae complex NOT DETECTED NOT DETECTED   Escherichia coli NOT DETECTED NOT DETECTED   Klebsiella aerogenes NOT DETECTED NOT DETECTED   Klebsiella oxytoca NOT DETECTED NOT DETECTED   Klebsiella pneumoniae NOT DETECTED NOT  DETECTED   Proteus species NOT DETECTED NOT DETECTED   Salmonella species NOT DETECTED NOT DETECTED   Serratia marcescens NOT DETECTED NOT DETECTED   Haemophilus influenzae NOT DETECTED NOT DETECTED   Neisseria meningitidis NOT DETECTED NOT DETECTED   Pseudomonas aeruginosa NOT DETECTED NOT DETECTED   Stenotrophomonas maltophilia NOT DETECTED NOT DETECTED   Candida albicans NOT DETECTED NOT DETECTED   Candida auris NOT DETECTED NOT DETECTED   Candida glabrata NOT DETECTED NOT DETECTED   Candida krusei NOT DETECTED NOT DETECTED   Candida parapsilosis NOT DETECTED NOT DETECTED   Candida tropicalis NOT DETECTED NOT DETECTED   Cryptococcus neoformans/gattii NOT DETECTED NOT DETECTED   Methicillin resistance mecA/C NOT DETECTED NOT DETECTED  Comprehensive metabolic panel  Result Value Ref Range   Sodium 139 135 - 145 mmol/L   Potassium 4.0 3.5 - 5.1 mmol/L   Chloride 109 98 - 111 mmol/L   CO2 24 22 - 32 mmol/L   Glucose, Bld 113 (H) 70 - 99 mg/dL   BUN 22 8 - 23 mg/dL   Creatinine, Ser 1.38 (H) 0.61 - 1.24 mg/dL   Calcium 8.9 8.9 - 10.3 mg/dL   Total Protein 7.0 6.5 - 8.1 g/dL   Albumin 3.9 3.5 - 5.0 g/dL   AST 18 15 - 41 U/L   ALT 16 0 - 44 U/L   Alkaline Phosphatase 89 38 - 126 U/L   Total Bilirubin 1.8 (H) 0.3 - 1.2 mg/dL   GFR, Estimated 49 (L) >60 mL/min   Anion gap 6 5 - 15  Lactic acid, plasma  Result Value Ref Range   Lactic Acid, Venous 1.8 0.5 - 1.9 mmol/L  CBC with Differential  Result Value Ref Range   WBC 9.8 4.0 - 10.5 K/uL   RBC 4.26 4.22 - 5.81 MIL/uL   Hemoglobin 12.0 (L) 13.0 - 17.0 g/dL   HCT 38.0 (L) 39.0 - 52.0 %   MCV 89.2 80.0 - 100.0 fL   MCH 28.2 26.0 - 34.0 pg   MCHC 31.6 30.0 - 36.0 g/dL   RDW 14.7 11.5 - 15.5 %   Platelets 211 150 - 400 K/uL   nRBC 0.0 0.0 - 0.2 %   Neutrophils Relative % 90 %   Neutro Abs 8.9 (H) 1.7 - 7.7 K/uL   Lymphocytes Relative 7 %   Lymphs Abs 0.7 0.7 - 4.0 K/uL   Monocytes Relative 2 %   Monocytes Absolute 0.2 0.1  - 1.0 K/uL   Eosinophils Relative 0 %   Eosinophils Absolute 0.0 0.0 - 0.5 K/uL   Basophils Relative 1 %   Basophils Absolute 0.1 0.0 - 0.1 K/uL   Immature Granulocytes 0 %   Abs Immature Granulocytes 0.03 0.00 - 0.07 K/uL  Urinalysis, Complete w Microscopic Nasopharyngeal Swab  Result Value Ref Range   Color, Urine RED (A) YELLOW  APPearance CLOUDY (A) CLEAR   Specific Gravity, Urine 1.013 1.005 - 1.030   pH 5.0 5.0 - 8.0   Glucose, UA NEGATIVE NEGATIVE mg/dL   Hgb urine dipstick LARGE (A) NEGATIVE   Bilirubin Urine NEGATIVE NEGATIVE   Ketones, ur 5 (A) NEGATIVE mg/dL   Protein, ur 100 (A) NEGATIVE mg/dL   Nitrite NEGATIVE NEGATIVE   Leukocytes,Ua LARGE (A) NEGATIVE   RBC / HPF >50 (H) 0 - 5 RBC/hpf   WBC, UA >50 (H) 0 - 5 WBC/hpf   Bacteria, UA MANY (A) NONE SEEN   Squamous Epithelial / LPF 0-5 0 - 5   WBC Clumps PRESENT    Mucus PRESENT    Hyaline Casts, UA PRESENT   Blood gas, venous  Result Value Ref Range   pH, Ven 7.42 7.250 - 7.430   pCO2, Ven 37 (L) 44.0 - 60.0 mmHg   pO2, Ven 34.0 32.0 - 45.0 mmHg   Bicarbonate 24.0 20.0 - 28.0 mmol/L   Acid-base deficit 0.3 0.0 - 2.0 mmol/L   O2 Saturation 66.9 %   Patient temperature 37.0    Collection site VEIN    Sample type VEIN   Protime-INR  Result Value Ref Range   Prothrombin Time 14.2 11.4 - 15.2 seconds   INR 1.1 0.8 - 1.2  APTT  Result Value Ref Range   aPTT 24 24 - 36 seconds  Magnesium  Result Value Ref Range   Magnesium 1.8 1.7 - 2.4 mg/dL  CBC  Result Value Ref Range   WBC 11.7 (H) 4.0 - 10.5 K/uL   RBC 3.69 (L) 4.22 - 5.81 MIL/uL   Hemoglobin 11.0 (L) 13.0 - 17.0 g/dL   HCT 32.2 (L) 39.0 - 52.0 %   MCV 87.3 80.0 - 100.0 fL   MCH 29.8 26.0 - 34.0 pg   MCHC 34.2 30.0 - 36.0 g/dL   RDW 15.2 11.5 - 15.5 %   Platelets 165 150 - 400 K/uL   nRBC 0.0 0.0 - 0.2 %  Basic metabolic panel  Result Value Ref Range   Sodium 135 135 - 145 mmol/L   Potassium 3.1 (L) 3.5 - 5.1 mmol/L   Chloride 108 98 - 111  mmol/L   CO2 21 (L) 22 - 32 mmol/L   Glucose, Bld 94 70 - 99 mg/dL   BUN 27 (H) 8 - 23 mg/dL   Creatinine, Ser 1.53 (H) 0.61 - 1.24 mg/dL   Calcium 8.3 (L) 8.9 - 10.3 mg/dL   GFR, Estimated 43 (L) >60 mL/min   Anion gap 6 5 - 15  Magnesium  Result Value Ref Range   Magnesium 1.8 1.7 - 2.4 mg/dL  Troponin I (High Sensitivity)  Result Value Ref Range   Troponin I (High Sensitivity) 12 <18 ng/L  Troponin I (High Sensitivity)  Result Value Ref Range   Troponin I (High Sensitivity) 42 (H) <18 ng/L      Assessment & Plan:   Problem List Items Addressed This Visit       Nervous and Auditory   Dementia due to Parkinson's disease without behavioral disturbance (HCC)    Chronic, progressive.  Discussed with his wife progressive nature of disease process, continue Donepezil 10 MG.  Continue to collaborate with neurology and palliative.  Monitor closely for falls and continue PT for fall prevention and strengthening if increased falls.  For mood may need to trial addition of Zoloft at low dose in future for sun downing.  Discussed with his wife today  that they may benefit from hospice level care in home due to progression of disease.         Genitourinary   Urinary tract infection with hematuria    Acute with foley in at present.  Will collaborate with urology on foley removal.         Other   Urinary retention with incomplete bladder emptying    Collaborate with urology at this time due to foley catheter in place.       Sepsis due to gram-negative UTI (HCC) - Primary    Acute, today is hypotensive in office and complains if dizziness -- on manual recheck BP remains in 60/40 range sitting.  At this time discussed with wife and patient that he would benefit from return to ER due to hypotension and dizziness -- concern for infection or hypovolemia -- HR remains stable at this time.  May benefit from IV fluids and further work-up.  Return for follow-up after ER.       Other Visit  Diagnoses     Other specified hypotension       BP remains 68/42 on manual recheck with dizziness -- at this time will return to ER due to concerns for ongoing infection and hypotension.       Time: 30 minutes, >50% spent counseling/or care coordination   Follow up plan: Return for after ER visit.

## 2020-09-06 NOTE — Telephone Encounter (Signed)
Noted  

## 2020-09-06 NOTE — Patient Instructions (Signed)
Hypotension As your heart beats, it forces blood through your body. This force is called blood pressure. If you have hypotension, you have low blood pressure. When your blood pressure is too low, you may not get enough blood to your brain or other parts of your body. This may cause you to feel weak, light-headed, have a fast heartbeat, or even pass out (faint). Low blood pressure may be harmless, or it may cause serious problems. What are the causes? Blood loss. Not enough water in the body (dehydration). Heart problems. Hormone problems. Pregnancy. A very bad infection. Not having enough of certain nutrients. Very bad allergic reactions. Certain medicines. What increases the risk? Age. The risk increases as you get older. Conditions that affect the heart or the brain and spinal cord (central nervous system). Taking certain medicines. Being pregnant. What are the signs or symptoms? Feeling: Weak. Light-headed. Dizzy. Tired (fatigued). Blurred vision. Fast heartbeat. Passing out, in very bad cases. How is this treated? Changing your diet. This may involve eating more salt (sodium) or drinking more water. Taking medicines to raise your blood pressure. Changing how much you take (the dosage) of some of your medicines. Wearing compression stockings. These stockings help to prevent blood clots and reduce swelling in your legs. In some cases, you may need to go to the hospital for: Fluid replacement. This means you will receive fluids through an IV tube. Blood replacement. This means you will receive donated blood through an IV tube (transfusion). Treating an infection or heart problems, if this applies. Monitoring. You may need to be monitored while medicines that you are taking wear off. Follow these instructions at home: Eating and drinking  Drink enough fluids to keep your pee (urine) pale yellow. Eat a healthy diet. Follow instructions from your doctor about what you can eat or  drink. A healthy diet includes: Fresh fruits and vegetables. Whole grains. Low-fat (lean) meats. Low-fat dairy products. Eat extra salt only as told. Do not add extra salt to your diet unless your doctor tells you to. Eat small meals often. Avoid standing up quickly after you eat.  Medicines Take over-the-counter and prescription medicines only as told by your doctor. Follow instructions from your doctor about changing how much you take of your medicines, if this applies. Do not stop or change any of your medicines on your own. General instructions  Wear compression stockings as told by your doctor. Get up slowly from lying down or sitting. Avoid hot showers and a lot of heat as told by your doctor. Return to your normal activities as told by your doctor. Ask what activities are safe for you. Do not use any products that contain nicotine or tobacco, such as cigarettes, e-cigarettes, and chewing tobacco. If you need help quitting, ask your doctor. Keep all follow-up visits as told by your doctor. This is important.  Contact a doctor if: You throw up (vomit). You have watery poop (diarrhea). You have a fever for more than 2-3 days. You feel more thirsty than normal. You feel weak and tired. Get help right away if: You have chest pain. You have a fast or uneven heartbeat. You lose feeling (have numbness) in any part of your body. You cannot move your arms or your legs. You have trouble talking. You get sweaty or feel light-headed. You pass out. You have trouble breathing. You have trouble staying awake. You feel mixed up (confused). Summary Hypotension is also called low blood pressure. It is when the force of   blood pumping through your arteries is too weak. Hypotension may be harmless, or it may cause serious problems. Treatment may include changing your diet and medicines, and wearing compression stockings. In very bad cases, you may need to go to the hospital. This  information is not intended to replace advice given to you by your health care provider. Make sure you discuss any questions you have with your healthcare provider. Document Revised: 08/05/2017 Document Reviewed: 08/05/2017 Elsevier Patient Education  2022 [employer]  

## 2020-09-06 NOTE — Assessment & Plan Note (Signed)
Collaborate with urology at this time due to foley catheter in place.

## 2020-09-06 NOTE — ED Triage Notes (Signed)
Pt reports that he was at a routine doctors visit and they took his B/P and it was low so they called EMS to pick him up. He states that he is dizzy, it is his only complaint.

## 2020-09-06 NOTE — Assessment & Plan Note (Signed)
Acute, today is hypotensive in office and complains if dizziness -- on manual recheck BP remains in 60/40 range sitting.  At this time discussed with wife and patient that he would benefit from return to ER due to hypotension and dizziness -- concern for infection or hypovolemia -- HR remains stable at this time.  May benefit from IV fluids and further work-up.  Return for follow-up after ER.

## 2020-09-06 NOTE — Assessment & Plan Note (Signed)
Chronic, progressive.  Discussed with his wife progressive nature of disease process, continue Donepezil 10 MG.  Continue to collaborate with neurology and palliative.  Monitor closely for falls and continue PT for fall prevention and strengthening if increased falls.  For mood may need to trial addition of Zoloft at low dose in future for sun downing.  Discussed with his wife today that they may benefit from hospice level care in home due to progression of disease.

## 2020-09-06 NOTE — Assessment & Plan Note (Signed)
Acute with foley in at present.  Will collaborate with urology on foley removal.

## 2020-09-06 NOTE — Telephone Encounter (Signed)
Jason Cross Is calling from [place] is calling to let Jason Cross know that he had a missed visit today due to a conflict. And the pt had a drs appt. Cb- [phone]

## 2020-09-06 NOTE — Telephone Encounter (Signed)
FYI

## 2020-09-07 ENCOUNTER — Emergency Department
Admission: EM | Admit: 2020-09-07 | Discharge: 2020-09-07 | Disposition: A | Discharge status: Home / Self Care | Payer: Medicare HMO | Attending: Emergency Medicine | Admitting: Emergency Medicine

## 2020-09-07 DIAGNOSIS — R42 Dizziness and giddiness: Secondary | ICD-10-CM

## 2020-09-07 DIAGNOSIS — N390 Urinary tract infection, site not specified: Secondary | ICD-10-CM

## 2020-09-07 LAB — URINALYSIS, COMPLETE (UACMP) WITH MICROSCOPIC
Bilirubin Urine: NEGATIVE
Glucose, UA: NEGATIVE mg/dL
Ketones, ur: NEGATIVE mg/dL
Nitrite: NEGATIVE
Protein, ur: 30 mg/dL — AB
RBC / HPF: >50 RBC/hpf — ABNORMAL HIGH (ref 0–5)
Specific Gravity, Urine: 1.017 (ref 1.005–1.030)
WBC, UA: >50 WBC/hpf — ABNORMAL HIGH (ref 0–5)
pH: 5 (ref 5.0–8.0)

## 2020-09-07 MED ORDER — CEPHALEXIN 500 MG PO CAPS: 1000.0000 mg | ORAL_CAPSULE | Freq: Two times a day (BID) | ORAL | 0 refills | Status: AC

## 2020-09-07 NOTE — ED Provider Notes (Addendum)
[facility] Emergency Department Provider Note   ____________________________________________   Event Date/Time   First MD Initiated Contact with Patient 09/07/20 0153     (approximate)  I have reviewed the triage vital signs and the nursing notes.   HISTORY  Chief Complaint Dizziness    HPI Jason Cross. is a 87 y.o. male with below stated past medical history and currently on treatment for UTI who presents from his primary care physician's office with complaints of hypotension.  Per nursing notes, patient had a blood pressure in the 60s/40s in the office and EMS were called for transport.  EMS found patient to be normotensive and patient has no complaints at this time.  Patient states that all symptoms have been improving since discharge for this UTI and denies any missed dosages of antibiotics.  Patient currently denies any vision changes, tinnitus, difficulty speaking, facial droop, sore throat, chest pain, shortness of breath, abdominal pain, nausea/vomiting/diarrhea, dysuria, or weakness/numbness/paresthesias in any extremity          Past Medical History:  Diagnosis Date   Arthritis    Benign prostate hyperplasia    Cataracts, bilateral    Coarse tremors    L>R hands   COPD (chronic obstructive pulmonary disease) (HCC)    first stages   Coronary artery disease    Dizziness    occasional   Gastric ulcer    in the 60's   GERD (gastroesophageal reflux disease)    takes Omeprazole every other day   Hard of hearing    doesn't wear hearing aids   History of colon polyps    History of kidney stones    still one on the left side, low   Hyperlipidemia    takes Simvastatin daily   Hypertension    stopped BP meds, BP under control   Macular degeneration    Myocardial infarction ([place]) 2009   Peripheral neuropathy    takes Gabapentin daily   Pneumonia    hx of in the 60's   Sleep apnea     Patient Active Problem List    Diagnosis Date Noted   Urinary tract infection with hematuria    Acute cystitis 08/28/2020   Sepsis due to gram-negative UTI ([place])    Coronary artery disease due to lipid rich plaque    Benign prostatic hyperplasia with urinary frequency    Weakness    At high risk for falls 06/28/2020   Senile purpura ([place]) 06/22/2020   Aortic atherosclerosis ([place]) 06/05/2020   Scoliosis of thoracolumbar spine 06/03/2020   History of 2019 novel coronavirus disease (COVID-19) 03/13/2020   CKD (chronic kidney disease) stage 3, GFR 30-59 ml/min (HCC) 11/26/2019   Difficulty walking 08/04/2017   Expressive aphasia 08/04/2017   Parkinson disease ([place]) 08/04/2017   Advanced care planning/counseling discussion 07/21/2016   GERD (gastroesophageal reflux disease) 12/30/2015   Neuropathy 07/17/2015   Dementia due to Parkinson's disease without behavioral disturbance ([place]) 07/17/2015   Orthostatic hypotension 07/17/2015   Hypertension 07/17/2015   Coronary artery disease involving coronary bypass graft of native heart without angina pectoris 07/17/2015   Urinary retention with incomplete bladder emptying 03/20/2013   Constipation due to slow transit 03/20/2013   Lumbar spondylolysis 03/14/2013   Lumbar scoliosis 03/10/2013    Past Surgical History:  Procedure Laterality Date   BACK SURGERY  2007   COLONOSCOPY     CORONARY ARTERY BYPASS GRAFT  2009   LIMA to LAD, SVG to D1 07/2007  EYE SURGERY Bilateral 2004, 2005   implants   GREEN LIGHT LASER TURP (TRANSURETHRAL RESECTION OF PROSTATE N/A 05/10/2013   Procedure: GREEN LIGHT LASER TURP (TRANSURETHRAL RESECTION OF PROSTATE;  Surgeon: Ardis Hughs, MD;  Location: [facility];  Service: Urology;  Laterality: N/A;   lipoma removed     chest wall   microthermo prostate  2009   [place] SURGERY  10/14   THORACIC DISCECTOMY N/A 03/10/2013   Procedure: Thoracic twelve-Lumbar one Laminectomy;  Surgeon: Erline Levine, MD;  Location: [facility];  Service: Neurosurgery;  Laterality: N/A;  Thoracic twelve-Lumbar one Laminectomy   TONSILLECTOMY     at age 69    Prior to Admission medications   Medication Sig Start Date End Date Taking? Authorizing Provider  cephALEXin (KEFLEX) 500 MG capsule Take 2 capsules (1,000 mg total) by mouth 2 (two) times daily for 3 days. 09/07/20 09/10/20 Yes Darinda [place], [place] Lawman, MD  acetaminophen (TYLENOL) 500 MG tablet Take 500 mg by mouth every 6 (six) hours as needed.    [provider]  aspirin EC 81 MG tablet Take 81 mg by mouth every morning.    [provider]  donepezil (ARICEPT) 10 MG tablet Take 1 tablet (10 mg total) by mouth at bedtime. 06/26/19   Cannady, Henrine Screws T, NP  gabapentin (NEURONTIN) 300 MG capsule Take 300 mg by mouth 3 (three) times daily. 06/12/19   [provider]  losartan (COZAAR) 25 MG tablet Take 25 mg by mouth daily. 06/03/19   [provider]  melatonin 1 MG TABS tablet Take 1 tablet (1 mg total) by mouth at bedtime as needed. 08/02/20   Cannady, Henrine Screws T, NP  Multiple Vitamins-Minerals (PRESERVISION AREDS 2 PO) Take 2 tablets by mouth daily.    [provider]  omeprazole (PRILOSEC) 40 MG capsule Take 40 mg by mouth daily. 08/28/19   [provider]  tamsulosin (FLOMAX) 0.4 MG CAPS capsule Take 1 capsule (0.4 mg total) by mouth daily after supper. 08/30/20   Max Sane, MD    Allergies Contrast media [iodinated diagnostic agents] and Codeine  Family History  Problem Relation Age of Onset   Hip fracture Mother    Heart disease Father    Hypertension Father    Heart attack Father    Thyroid disease Father    Cancer Sister        breast   Breast cancer Sister     Social History Social History   Tobacco Use   Smoking status: Former    Years: 40.00    Types: Cigarettes    Quit date: 02/23/1993    Years since quitting: 27.5   Smokeless tobacco: Never  Vaping Use   Vaping Use: Never used  Substance Use Topics    Alcohol use: No   Drug use: No    Review of Systems Constitutional: No fever/chills Eyes: No visual changes. ENT: No sore throat. Cardiovascular: Denies chest pain. Respiratory: Denies shortness of breath. Gastrointestinal: No abdominal pain.  No nausea, no vomiting.  No diarrhea. Genitourinary: Negative for dysuria. Musculoskeletal: Negative for acute arthralgias Skin: Negative for rash. Neurological: Negative for headaches, weakness/numbness/paresthesias in any extremity Psychiatric: Negative for suicidal ideation/homicidal ideation   ____________________________________________   PHYSICAL EXAM:  VITAL SIGNS: ED Triage Vitals  Enc Vitals Group     BP 09/06/20 1736 129/64     Pulse Rate 09/06/20 1736 64     Resp 09/06/20 1736 18  Temp 09/06/20 1736 98.8 F (37.1 C)     Temp Source 09/06/20 1736 Oral     SpO2 09/06/20 1736 97 %     Weight 09/06/20 1737 160 lb (72.6 kg)     Height 09/06/20 1737 5\' 10"  (1.778 m)     Head Circumference --      Peak Flow --      Pain Score 09/06/20 1737 0     Pain Loc --      Pain Edu? --      Excl. in [place]? --    Constitutional: Alert and oriented. Well appearing and in no acute distress. Eyes: Conjunctivae are normal. PERRL. Head: Atraumatic. Nose: No congestion/rhinnorhea. Mouth/Throat: Mucous membranes are moist. Neck: No stridor Cardiovascular: Grossly normal heart sounds.  Good peripheral circulation. Respiratory: Normal respiratory effort.  No retractions. Gastrointestinal: Soft and nontender. No distention. Musculoskeletal: No obvious deformities Neurologic:  Normal speech and language. No gross focal neurologic deficits are appreciated. Skin:  Skin is warm and dry. No rash noted. Psychiatric: Mood and affect are normal. Speech and behavior are normal.  ____________________________________________   LABS (all labs ordered are listed, but only abnormal results are displayed)  Labs Reviewed  BASIC METABOLIC PANEL -  Abnormal; Notable for the following components:      Result Value   BUN 24 (*)    Creatinine, Ser 1.45 (*)    Calcium 8.7 (*)    GFR, Estimated 46 (*)    All other components within normal limits  CBC - Abnormal; Notable for the following components:   RBC 3.91 (*)    Hemoglobin 11.2 (*)    HCT 34.9 (*)    All other components within normal limits  URINALYSIS, COMPLETE (UACMP) WITH MICROSCOPIC - Abnormal; Notable for the following components:   Color, Urine YELLOW (*)    APPearance CLOUDY (*)    Hgb urine dipstick SMALL (*)    Protein, ur 30 (*)    Leukocytes,Ua LARGE (*)    RBC / HPF >50 (*)    WBC, UA >50 (*)    Bacteria, UA FEW (*)    All other components within normal limits  PROTIME-INR  CBG MONITORING, ED   ____________________________________________  EKG  ED ECG REPORT I, Naaman Plummer, the attending physician, personally viewed and interpreted this ECG.  Date: 09/07/2020 EKG Time: 1746 Rate: 96 Rhythm: normal sinus rhythm with run of atrial fibrillation QRS Axis: normal Intervals: normal ST/T Wave abnormalities: normal Narrative Interpretation: no evidence of acute ischemia   PROCEDURES  Procedure(s) performed (including Critical Care):  .1-3 Lead EKG Interpretation  Date/Time: 09/07/2020 3:41 AM Performed by: Naaman Plummer, MD Authorized by: Naaman Plummer, MD     Interpretation: normal     ECG rate:  69   ECG rate assessment: normal     Rhythm: sinus rhythm     Ectopy: none     Conduction: normal     ____________________________________________   INITIAL IMPRESSION / ASSESSMENT AND PLAN / ED COURSE  As part of my medical decision making, I reviewed the following data within the electronic medical record, if available:  Nursing notes reviewed and incorporated, Labs reviewed, EKG interpreted, Old chart reviewed, Radiograph reviewed and Notes from prior ED visits reviewed and incorporated        No e/o epididymo-orchitis on exam and low  suspicion for rectal abscess, prostatitis, other GU deep space infection, gonorrhea/chlamydia. Unlikely Infected Urolithiasis, AAA, cholecystitis, pancreatitis, SBO, appendicitis, or other acute  abdomen. Workup: UA: None Rx: Cefdinir 300 mg twice daily x5 days Patient remained normotensive throughout his emergency department course Disposition: Discharge home. SRP discussed. Advise follow up with primary care provider within 24-72 hours.      ____________________________________________   FINAL CLINICAL IMPRESSION(S) / ED DIAGNOSES  Final diagnoses:  Lower urinary tract infectious disease  Dizziness     ED Discharge Orders          Ordered    cephALEXin (KEFLEX) 500 MG capsule  2 times daily        09/07/20 0336             Note:  This document was prepared using Dragon voice recognition software and may include unintentional dictation errors.    Naaman Plummer, MD 09/07/20 7615    Naaman Plummer, MD 09/07/20 [phone]

## 2020-09-09 ENCOUNTER — Telehealth: Payer: Self-pay

## 2020-09-09 NOTE — Telephone Encounter (Signed)
Post hospital call check in. Spoke with patient's wife who requested a call later as patient is currently working with PT

## 2020-09-12 ENCOUNTER — Ambulatory Visit: Payer: Self-pay | Admitting: Physician Assistant

## 2020-09-12 ENCOUNTER — Ambulatory Visit: Payer: Medicare HMO | Admitting: Physician Assistant

## 2020-09-12 ENCOUNTER — Other Ambulatory Visit: Payer: Self-pay

## 2020-09-12 DIAGNOSIS — R339 Retention of urine, unspecified: Secondary | ICD-10-CM

## 2020-09-12 LAB — BLADDER SCAN AMB NON-IMAGING

## 2020-09-12 MED ORDER — SILODOSIN 8 MG PO CAPS: 8.0000 mg | ORAL_CAPSULE | Freq: Every day | ORAL | 0 refills | Status: DC

## 2020-09-12 NOTE — Progress Notes (Signed)
09/12/2020 5:36 PM   Jason Cross. [birth year] [id number]  CC: Chief Complaint  Patient presents with   Other   HPI: Jason Cross. is a 87 y.o. comorbid male with a history of dementia, Parkinson's disease, and greenlight PVP in 2015 recently admitted with febrile UTI and likely acute on chronic urinary retention who presents today for voiding trial.  Foley catheter was placed during admission and has remained in place since. He is accompanied today by his wife, who contributes to HPI.  He was seen in the emergency department 5 days ago with reports of dizziness and hypotension per his PCP.  He was noted to be normotensive in the emergency department.  Urine sample obtained in the ED appeared grossly infected and he was started on empiric Keflex x5 days for management of UTI.  For unclear reasons, urine culture was not sent. Per patient report, urine sample was obtained from his drainage bag.  Notably, patient was started on tamsulosin during his recent hospitalization. He reports dizziness at baseline, no worse since starting Flomax. Wife reports an approximate 50-month history of urinary frequency and nocturia with low volume voids as well as urinary incontinence.  Foley catheter removed in the morning, see procedure note below for more information. Patient returned to clinic in the afternoon for PVR. He reports drinking 32oz and has had significant insensate leakage. PVR 74mL.  PMH: Past Medical History:  Diagnosis Date   Arthritis    Benign prostate hyperplasia    Cataracts, bilateral    Coarse tremors    L>R hands   COPD (chronic obstructive pulmonary disease) (HCC)    first stages   Coronary artery disease    Dizziness    occasional   Gastric ulcer    in the 60's   GERD (gastroesophageal reflux disease)    takes Omeprazole every other day   Hard of hearing    doesn't wear hearing aids   History of colon polyps    History of kidney stones    still one on  the left side, low   Hyperlipidemia    takes Simvastatin daily   Hypertension    stopped BP meds, BP under control   Macular degeneration    Myocardial infarction ([place]) 2009   Peripheral neuropathy    takes Gabapentin daily   Pneumonia    hx of in the 60's   Sleep apnea     Surgical History: Past Surgical History:  Procedure Laterality Date   BACK SURGERY  2007   COLONOSCOPY     CORONARY ARTERY BYPASS GRAFT  2009   LIMA to LAD, SVG to D1 07/2007   EYE SURGERY Bilateral 2004, 2005   implants   GREEN LIGHT LASER TURP (TRANSURETHRAL RESECTION OF PROSTATE N/A 05/10/2013   Procedure: GREEN LIGHT LASER TURP (TRANSURETHRAL RESECTION OF PROSTATE;  Surgeon: Ardis Hughs, MD;  [facility];  Service: Urology;  Laterality: N/A;   lipoma removed     chest wall   microthermo prostate  2009   [place] SURGERY  10/14   THORACIC DISCECTOMY N/A 03/10/2013   Procedure: Thoracic twelve-Lumbar one Laminectomy;  Surgeon: Erline Levine, MD;  Location: [facility] OR[facility];  Service: Neurosurgery;  Laterality: N/A;  Thoracic twelve-Lumbar one Laminectomy   TONSILLECTOMY     at age 28    Home Medications:  Allergies as of 09/12/2020       Reactions   Contrast Media [iodinated Diagnostic  Agents] Hives, Itching, Rash   Codeine Nausea And Vomiting        Medication List        Accurate as of September 12, 2020  5:36 PM. If you have any questions, ask your nurse or doctor.          STOP taking these medications    tamsulosin 0.4 MG Caps capsule Commonly known as: FLOMAX Stopped by: Debroah Loop, PA-C       TAKE these medications    acetaminophen 500 MG tablet Commonly known as: TYLENOL Take 500 mg by mouth every 6 (six) hours as needed.   aspirin EC 81 MG tablet Take 81 mg by mouth every morning.   donepezil 10 MG tablet Commonly known as: ARICEPT Take 1 tablet (10 mg total) by mouth at bedtime.   gabapentin 300 MG capsule Commonly known as:  NEURONTIN Take 300 mg by mouth 3 (three) times daily.   losartan 25 MG tablet Commonly known as: COZAAR Take 25 mg by mouth daily.   melatonin 1 MG Tabs tablet Take 1 tablet (1 mg total) by mouth at bedtime as needed.   omeprazole 40 MG capsule Commonly known as: PRILOSEC Take 40 mg by mouth daily.   PRESERVISION AREDS 2 PO Take 2 tablets by mouth daily.   silodosin 8 MG Caps capsule Commonly known as: RAPAFLO Take 1 capsule (8 mg total) by mouth daily with breakfast. Started by: Debroah Loop, PA-C        Allergies:  Allergies  Allergen Reactions   Contrast Media [Iodinated Diagnostic Agents] Hives, Itching and Rash   Codeine Nausea And Vomiting    Family History: Family History  Problem Relation Age of Onset   Hip fracture Mother    Heart disease Father    Hypertension Father    Heart attack Father    Thyroid disease Father    Cancer Sister        breast   Breast cancer Sister     Social History:   reports that he quit smoking about 27 years ago. His smoking use included cigarettes. He has never used smokeless tobacco. He reports that he does not drink alcohol and does not use drugs.  Physical Exam: There were no vitals taken for this visit.  Constitutional:  Alert and oriented, no acute distress, nontoxic appearing HEENT: [place], AT Cardiovascular: No clubbing, cyanosis, or edema Respiratory: Normal respiratory effort, no increased work of breathing Skin: No rashes, bruises or suspicious lesions Neurologic: Grossly intact, no focal deficits, moving all 4 extremities Psychiatric: Normal mood and affect  Laboratory Data: Results for orders placed or performed in visit on 09/12/20  BLADDER SCAN AMB NON-IMAGING  Result Value Ref Range   Scan Result 58mL    Catheter Removal  Patient is present today for a catheter removal.  2ml of water was drained from the balloon. A 20FR foley cath was removed from the bladder no complications were noted. Patient  tolerated well.  Performed by: Debroah Loop, PA-C    Assessment & Plan:   87 year old male with Parkinson's disease and dementia s/p greenlight PVP in 2015 recently admitted with urinary retention and febrile UTI.  Patient has completed a course of culture appropriate antibiotics.  Per history, I strongly suspect he has a chronic history of incomplete bladder emptying/urinary retention dating back about 6 months, manifesting in low volume frequent voids and overflow incontinence.  I am doubtful that his recent ED visit was associated with a true urinary tract  infection, however I am concerned that tamsulosin may have contributed to orthostasis in the setting and recommend switching to a more prostate-specific alpha-blocker, namely silodosin, to reduce his risk for this.  Differential for urinary retention in this patient includes infection, BOO though this is unlikely given visible prostate defect on recent cross-sectional imaging, bladder neck contracture though this is less likely given the ability to place a 20 French Foley catheter in the hospital, and neurogenic bladder given his Parkinson's disease. 1. Urinary retention Afternoon bladder scan WNL, though patient reports significant urinary leakage today with minimal awareness. Will switch from tamsulosin to silodosin to reduce risk for orthostasis as above.  Recommend repeat PVR in 2 weeks. If residual remains WNL, may consider low dose Myrbetriq but we discussed proceeding cautiously given his recent episode of retention. - BLADDER SCAN AMB NON-IMAGING - silodosin (RAPAFLO) 8 MG CAPS capsule; Take 1 capsule (8 mg total) by mouth daily with breakfast.  Dispense: 30 capsule; Refill: 0   Return in about 2 weeks (around 09/26/2020) for Repeat PVR.  Debroah Loop, PA-C  [facility] [address] [place], [place] [address] [phone]

## 2020-09-16 ENCOUNTER — Ambulatory Visit: Payer: Medicare HMO | Admitting: Urology

## 2020-09-20 ENCOUNTER — Telehealth: Payer: Self-pay

## 2020-09-20 NOTE — Telephone Encounter (Signed)
Called and gave verbal orders per [name].  

## 2020-09-20 NOTE — Telephone Encounter (Signed)
Copied from [place] [id number]. Topic: Quick Communication - Home Health Verbal Orders >> Sep 20, 2020 12:03 PM Tessa Lerner A wrote: Caller/Agency: Damian Leavell Number: [phone]  Requesting OT/PT/Skilled Nursing/Social Work/Speech Therapy: OT  Frequency: 1w5    Routing to provider for verbal orders.

## 2020-09-26 ENCOUNTER — Ambulatory Visit: Payer: Self-pay | Admitting: Physician Assistant

## 2020-09-27 ENCOUNTER — Other Ambulatory Visit: Payer: Self-pay

## 2020-09-27 ENCOUNTER — Ambulatory Visit: Payer: Medicare HMO | Admitting: Physician Assistant

## 2020-09-27 ENCOUNTER — Telehealth: Payer: Self-pay | Admitting: Physician Assistant

## 2020-09-27 ENCOUNTER — Encounter: Payer: Self-pay | Admitting: Physician Assistant

## 2020-09-27 VITALS — BP 118/65 | HR 71 | Ht 69.0 in | Wt 161.0 lb

## 2020-09-27 DIAGNOSIS — R339 Retention of urine, unspecified: Secondary | ICD-10-CM | POA: Diagnosis not present

## 2020-09-27 LAB — BLADDER SCAN AMB NON-IMAGING

## 2020-09-27 NOTE — Progress Notes (Signed)
09/27/2020 4:16 PM   Jason Cross. 1929-02-14 [id number]  CC: Chief Complaint  Patient presents with   Urinary Retention   HPI: Jason Cross. is a 87 y.o. comorbid male with PMH dementia, Parkinson's disease, and greenlight PVP in 2015 recently admitted with febrile UTI and likely acute on chronic urinary retention who passed an outpatient voiding trial with me 2 weeks ago who presents today for repeat PVR.  He presents today with his wife, who contributes to HPI.  Today he reports he has been able to urinate since his last clinic visit.  He is unsure if he is experiencing hesitancy or straining, however his wife states that it typically takes him some time to initiate a stream.  He denies dysuria and urge incontinence.  His wife reports today concerns that silodosin was quite expensive.  She has not noticed a change in his reported dizziness on this medication versus Flomax.  She is primarily concerned with his urinary leakage, which appears to be insensate.  He wears absorbent underwear for this but she has noticed some urinary odor and wonders how we can manage this.  PVR 147m.  PMH: Past Medical History:  Diagnosis Date   Arthritis    Benign prostate hyperplasia    Cataracts, bilateral    Coarse tremors    L>R hands   COPD (chronic obstructive pulmonary disease) (HCC)    first stages   Coronary artery disease    Dizziness    occasional   Gastric ulcer    in the 60's   GERD (gastroesophageal reflux disease)    takes Omeprazole every other day   Hard of hearing    doesn't wear hearing aids   History of colon polyps    History of kidney stones    still one on the left side, low   Hyperlipidemia    takes Simvastatin daily   Hypertension    stopped BP meds, BP under control   Macular degeneration    Myocardial infarction ([place] 2009   Peripheral neuropathy    takes Gabapentin daily   Pneumonia    hx of in the 60's   Sleep apnea     Surgical  History: Past Surgical History:  Procedure Laterality Date   BACK SURGERY  2007   COLONOSCOPY     CORONARY ARTERY BYPASS GRAFT  2009   LIMA to LAD, SVG to D1 07/2007   EYE SURGERY Bilateral 2004, 2005   implants   GREEN LIGHT LASER TURP (TRANSURETHRAL RESECTION OF PROSTATE N/A 05/10/2013   Procedure: GREEN LIGHT LASER TURP (TRANSURETHRAL RESECTION OF PROSTATE;  Surgeon: BArdis Hughs MD;  Location: [facility];  Service: Urology;  Laterality: N/A;   lipoma removed     chest wall   microthermo prostate  2009   NSacSURGERY  10/14   THORACIC DISCECTOMY N/A 03/10/2013   Procedure: Thoracic twelve-Lumbar one Laminectomy;  Surgeon: JErline Levine MD;  [facility];  Service: Neurosurgery;  Laterality: N/A;  Thoracic twelve-Lumbar one Laminectomy   TONSILLECTOMY     at age [age]   Home Medications:  Allergies as of 09/27/2020       Reactions   Contrast Media [iodinated Diagnostic Agents] Hives, Itching, Rash   Codeine Nausea And Vomiting        Medication List        Accurate as of September 27, 2020  4:16 PM. If you have any questions,  ask your nurse or doctor.          acetaminophen 500 MG tablet Commonly known as: TYLENOL Take 500 mg by mouth every 6 (six) hours as needed.   aspirin EC 81 MG tablet Take 81 mg by mouth every morning.   donepezil 10 MG tablet Commonly known as: ARICEPT Take 1 tablet (10 mg total) by mouth at bedtime.   gabapentin 300 MG capsule Commonly known as: NEURONTIN Take 300 mg by mouth 3 (three) times daily.   losartan 25 MG tablet Commonly known as: COZAAR Take 25 mg by mouth daily.   melatonin 1 MG Tabs tablet Take 1 tablet (1 mg total) by mouth at bedtime as needed.   omeprazole 40 MG capsule Commonly known as: PRILOSEC Take 40 mg by mouth daily.   PRESERVISION AREDS 2 PO Take 2 tablets by mouth daily.   silodosin 8 MG Caps capsule Commonly known as: RAPAFLO Take 1 capsule (8 mg total) by mouth  daily with breakfast.        Allergies:  Allergies  Allergen Reactions   Contrast Media [Iodinated Diagnostic Agents] Hives, Itching and Rash   Codeine Nausea And Vomiting    Family History: Family History  Problem Relation Age of Onset   Hip fracture Mother    Heart disease Father    Hypertension Father    Heart attack Father    Thyroid disease Father    Cancer Sister        breast   Breast cancer Sister     Social History:   reports that he quit smoking about 27 years ago. His smoking use included cigarettes. He has never used smokeless tobacco. He reports that he does not drink alcohol and does not use drugs.  Physical Exam: BP 118/65   Pulse 71   Ht '5\' 9"'$  (1.753 m)   Wt 161 lb (73 kg)   BMI 23.78 kg/m   Constitutional:  Alert, no acute distress, nontoxic appearing, reports confusion HEENT: [place], AT Cardiovascular: No clubbing, cyanosis, or edema Respiratory: Normal respiratory effort, no increased work of breathing Skin: No rashes, bruises or suspicious lesions Neurologic: Grossly intact, no focal deficits, moving all 4 extremities Psychiatric: Normal mood and affect  Laboratory Data: Results for orders placed or performed in visit on 09/27/20  Bladder Scan (Post Void Residual) in office  Result Value Ref Range   Scan Result 150m    Assessment & Plan:   1. Urinary retention PVR slightly elevated, however patient is not in overt retention today.  Given his dementia and multiple medical comorbidities, I recommend conservative management at this time.  We had discussed previously considering a trial of low-dose Myrbetriq for his urinary leakage, however on further questioning today it appears his leakage is insensate and not associated with urge.  Given this and that his PVR is slightly elevated today over 2 weeks ago, I recommend that we defer pharmacotherapy as I do not believe it will significantly help and I think it could hinder his emptying.  We discussed  conservative management options for his urinary leakage today including continued absorbent underwear, penile incontinence clamps, and condom catheters.  Patient's wife thinks he will struggle with incontinence clamps and condom catheters and prefers to continue with absorbent underwear for the time being.  I think this is reasonable.  I do not think he would be a good candidate for more invasive interventions including artificial sphincter given his dementia and recent history of retention.  After this  clinic visit, I consulted with Dr. Bernardo Heater, who agrees.  Given no apparent symptomatic improvement of his occasional dizziness on silodosin versus tamsulosin, okay to resume tamsulosin for cost reasons.  Patient will complete the silodosin that he has on hand and then will represcribe Flomax when needed. - Bladder Scan (Post Void Residual) in office  Return in about 1 year (around 09/27/2021) for Symptom recheck with PVR.  Debroah Loop, PA-C  [facility] [address] [place], [place] [address] [phone]

## 2020-09-27 NOTE — Telephone Encounter (Signed)
Please contact the patient and his wife and inform them that I spoke with Dr. Bernardo Heater about management options for his urinary leakage, and he agreed that the best options at this point would be continued use of depends versus incontinence clamp versus condom catheter.

## 2020-09-27 NOTE — Patient Instructions (Signed)
Once you finish your silodosin (Rapaflo) prescription, ok to switch back to tamsulosin (Flomax). Please call our office to request a refill when the time comes.

## 2020-09-30 NOTE — Telephone Encounter (Signed)
Notified patients wife as advised, she verbalized understanding.

## 2020-10-03 ENCOUNTER — Other Ambulatory Visit: Payer: Medicare HMO | Admitting: Primary Care

## 2020-10-03 ENCOUNTER — Other Ambulatory Visit: Payer: Self-pay

## 2020-10-03 DIAGNOSIS — I25708 Atherosclerosis of coronary artery bypass graft(s), unspecified, with other forms of angina pectoris: Secondary | ICD-10-CM | POA: Diagnosis not present

## 2020-10-03 DIAGNOSIS — R2689 Other abnormalities of gait and mobility: Secondary | ICD-10-CM | POA: Diagnosis not present

## 2020-10-03 DIAGNOSIS — I25810 Atherosclerosis of coronary artery bypass graft(s) without angina pectoris: Secondary | ICD-10-CM | POA: Diagnosis not present

## 2020-10-03 DIAGNOSIS — R519 Headache, unspecified: Secondary | ICD-10-CM | POA: Diagnosis not present

## 2020-10-03 DIAGNOSIS — R251 Tremor, unspecified: Secondary | ICD-10-CM | POA: Diagnosis not present

## 2020-10-03 DIAGNOSIS — I428 Other cardiomyopathies: Secondary | ICD-10-CM | POA: Diagnosis not present

## 2020-10-03 DIAGNOSIS — R413 Other amnesia: Secondary | ICD-10-CM | POA: Diagnosis not present

## 2020-10-03 DIAGNOSIS — R262 Difficulty in walking, not elsewhere classified: Secondary | ICD-10-CM | POA: Diagnosis not present

## 2020-10-03 DIAGNOSIS — R634 Abnormal weight loss: Secondary | ICD-10-CM | POA: Diagnosis not present

## 2020-10-03 DIAGNOSIS — I10 Essential (primary) hypertension: Secondary | ICD-10-CM | POA: Diagnosis not present

## 2020-10-03 DIAGNOSIS — E782 Mixed hyperlipidemia: Secondary | ICD-10-CM | POA: Diagnosis not present

## 2020-10-03 DIAGNOSIS — M79604 Pain in right leg: Secondary | ICD-10-CM | POA: Diagnosis not present

## 2020-10-04 ENCOUNTER — Other Ambulatory Visit: Payer: Self-pay | Admitting: Physician Assistant

## 2020-10-04 DIAGNOSIS — R339 Retention of urine, unspecified: Secondary | ICD-10-CM

## 2020-10-04 NOTE — Telephone Encounter (Signed)
Does this pt need to continue Silodosin? Please advise.

## 2020-10-08 DIAGNOSIS — G4733 Obstructive sleep apnea (adult) (pediatric): Secondary | ICD-10-CM | POA: Diagnosis not present

## 2020-10-08 DIAGNOSIS — N401 Enlarged prostate with lower urinary tract symptoms: Secondary | ICD-10-CM | POA: Diagnosis not present

## 2020-10-08 DIAGNOSIS — F0281 Dementia in other diseases classified elsewhere with behavioral disturbance: Secondary | ICD-10-CM | POA: Diagnosis not present

## 2020-10-08 DIAGNOSIS — I252 Old myocardial infarction: Secondary | ICD-10-CM | POA: Diagnosis not present

## 2020-10-08 DIAGNOSIS — K219 Gastro-esophageal reflux disease without esophagitis: Secondary | ICD-10-CM | POA: Diagnosis not present

## 2020-10-08 DIAGNOSIS — H9193 Unspecified hearing loss, bilateral: Secondary | ICD-10-CM | POA: Diagnosis not present

## 2020-10-08 DIAGNOSIS — H3530 Unspecified macular degeneration: Secondary | ICD-10-CM | POA: Diagnosis not present

## 2020-10-08 DIAGNOSIS — I10 Essential (primary) hypertension: Secondary | ICD-10-CM | POA: Diagnosis not present

## 2020-10-08 DIAGNOSIS — N3000 Acute cystitis without hematuria: Secondary | ICD-10-CM | POA: Diagnosis not present

## 2020-10-08 DIAGNOSIS — M419 Scoliosis, unspecified: Secondary | ICD-10-CM | POA: Diagnosis not present

## 2020-10-08 DIAGNOSIS — N39498 Other specified urinary incontinence: Secondary | ICD-10-CM | POA: Diagnosis not present

## 2020-10-08 DIAGNOSIS — I700 Atherosclerosis of aorta: Secondary | ICD-10-CM | POA: Diagnosis not present

## 2020-10-08 DIAGNOSIS — Z7982 Long term (current) use of aspirin: Secondary | ICD-10-CM | POA: Diagnosis not present

## 2020-10-08 DIAGNOSIS — E876 Hypokalemia: Secondary | ICD-10-CM | POA: Diagnosis not present

## 2020-10-08 DIAGNOSIS — G20 Parkinson's disease: Secondary | ICD-10-CM | POA: Diagnosis not present

## 2020-10-08 DIAGNOSIS — R69 Illness, unspecified: Secondary | ICD-10-CM | POA: Diagnosis not present

## 2020-10-08 DIAGNOSIS — G629 Polyneuropathy, unspecified: Secondary | ICD-10-CM | POA: Diagnosis not present

## 2020-10-08 DIAGNOSIS — A4151 Sepsis due to Escherichia coli [E. coli]: Secondary | ICD-10-CM | POA: Diagnosis not present

## 2020-10-08 DIAGNOSIS — G9341 Metabolic encephalopathy: Secondary | ICD-10-CM | POA: Diagnosis not present

## 2020-10-08 DIAGNOSIS — I2510 Atherosclerotic heart disease of native coronary artery without angina pectoris: Secondary | ICD-10-CM | POA: Diagnosis not present

## 2020-10-08 DIAGNOSIS — E785 Hyperlipidemia, unspecified: Secondary | ICD-10-CM | POA: Diagnosis not present

## 2020-10-08 DIAGNOSIS — R350 Frequency of micturition: Secondary | ICD-10-CM | POA: Diagnosis not present

## 2020-10-08 DIAGNOSIS — J449 Chronic obstructive pulmonary disease, unspecified: Secondary | ICD-10-CM | POA: Diagnosis not present

## 2020-10-08 DIAGNOSIS — N320 Bladder-neck obstruction: Secondary | ICD-10-CM | POA: Diagnosis not present

## 2020-10-08 DIAGNOSIS — M1990 Unspecified osteoarthritis, unspecified site: Secondary | ICD-10-CM | POA: Diagnosis not present

## 2020-10-11 DIAGNOSIS — Z7982 Long term (current) use of aspirin: Secondary | ICD-10-CM | POA: Diagnosis not present

## 2020-10-11 DIAGNOSIS — R69 Illness, unspecified: Secondary | ICD-10-CM | POA: Diagnosis not present

## 2020-10-11 DIAGNOSIS — H3530 Unspecified macular degeneration: Secondary | ICD-10-CM | POA: Diagnosis not present

## 2020-10-11 DIAGNOSIS — G629 Polyneuropathy, unspecified: Secondary | ICD-10-CM | POA: Diagnosis not present

## 2020-10-11 DIAGNOSIS — M1990 Unspecified osteoarthritis, unspecified site: Secondary | ICD-10-CM | POA: Diagnosis not present

## 2020-10-11 DIAGNOSIS — G4733 Obstructive sleep apnea (adult) (pediatric): Secondary | ICD-10-CM | POA: Diagnosis not present

## 2020-10-11 DIAGNOSIS — G20 Parkinson's disease: Secondary | ICD-10-CM | POA: Diagnosis not present

## 2020-10-11 DIAGNOSIS — N3000 Acute cystitis without hematuria: Secondary | ICD-10-CM | POA: Diagnosis not present

## 2020-10-11 DIAGNOSIS — E785 Hyperlipidemia, unspecified: Secondary | ICD-10-CM | POA: Diagnosis not present

## 2020-10-11 DIAGNOSIS — J449 Chronic obstructive pulmonary disease, unspecified: Secondary | ICD-10-CM | POA: Diagnosis not present

## 2020-10-11 DIAGNOSIS — N39498 Other specified urinary incontinence: Secondary | ICD-10-CM | POA: Diagnosis not present

## 2020-10-11 DIAGNOSIS — M419 Scoliosis, unspecified: Secondary | ICD-10-CM | POA: Diagnosis not present

## 2020-10-11 DIAGNOSIS — R350 Frequency of micturition: Secondary | ICD-10-CM | POA: Diagnosis not present

## 2020-10-11 DIAGNOSIS — H9193 Unspecified hearing loss, bilateral: Secondary | ICD-10-CM | POA: Diagnosis not present

## 2020-10-11 DIAGNOSIS — N320 Bladder-neck obstruction: Secondary | ICD-10-CM | POA: Diagnosis not present

## 2020-10-11 DIAGNOSIS — A4151 Sepsis due to Escherichia coli [E. coli]: Secondary | ICD-10-CM | POA: Diagnosis not present

## 2020-10-11 DIAGNOSIS — I700 Atherosclerosis of aorta: Secondary | ICD-10-CM | POA: Diagnosis not present

## 2020-10-11 DIAGNOSIS — G9341 Metabolic encephalopathy: Secondary | ICD-10-CM | POA: Diagnosis not present

## 2020-10-11 DIAGNOSIS — I252 Old myocardial infarction: Secondary | ICD-10-CM | POA: Diagnosis not present

## 2020-10-11 DIAGNOSIS — N401 Enlarged prostate with lower urinary tract symptoms: Secondary | ICD-10-CM | POA: Diagnosis not present

## 2020-10-11 DIAGNOSIS — I2510 Atherosclerotic heart disease of native coronary artery without angina pectoris: Secondary | ICD-10-CM | POA: Diagnosis not present

## 2020-10-11 DIAGNOSIS — I10 Essential (primary) hypertension: Secondary | ICD-10-CM | POA: Diagnosis not present

## 2020-10-11 DIAGNOSIS — E876 Hypokalemia: Secondary | ICD-10-CM | POA: Diagnosis not present

## 2020-10-11 DIAGNOSIS — K219 Gastro-esophageal reflux disease without esophagitis: Secondary | ICD-10-CM | POA: Diagnosis not present

## 2020-10-17 DIAGNOSIS — M1990 Unspecified osteoarthritis, unspecified site: Secondary | ICD-10-CM | POA: Diagnosis not present

## 2020-10-17 DIAGNOSIS — G4733 Obstructive sleep apnea (adult) (pediatric): Secondary | ICD-10-CM | POA: Diagnosis not present

## 2020-10-17 DIAGNOSIS — R69 Illness, unspecified: Secondary | ICD-10-CM | POA: Diagnosis not present

## 2020-10-17 DIAGNOSIS — K219 Gastro-esophageal reflux disease without esophagitis: Secondary | ICD-10-CM | POA: Diagnosis not present

## 2020-10-17 DIAGNOSIS — G9341 Metabolic encephalopathy: Secondary | ICD-10-CM | POA: Diagnosis not present

## 2020-10-17 DIAGNOSIS — N39498 Other specified urinary incontinence: Secondary | ICD-10-CM | POA: Diagnosis not present

## 2020-10-17 DIAGNOSIS — E876 Hypokalemia: Secondary | ICD-10-CM | POA: Diagnosis not present

## 2020-10-17 DIAGNOSIS — N320 Bladder-neck obstruction: Secondary | ICD-10-CM | POA: Diagnosis not present

## 2020-10-17 DIAGNOSIS — N3000 Acute cystitis without hematuria: Secondary | ICD-10-CM | POA: Diagnosis not present

## 2020-10-17 DIAGNOSIS — H3530 Unspecified macular degeneration: Secondary | ICD-10-CM | POA: Diagnosis not present

## 2020-10-17 DIAGNOSIS — N401 Enlarged prostate with lower urinary tract symptoms: Secondary | ICD-10-CM | POA: Diagnosis not present

## 2020-10-17 DIAGNOSIS — Z7982 Long term (current) use of aspirin: Secondary | ICD-10-CM | POA: Diagnosis not present

## 2020-10-17 DIAGNOSIS — I10 Essential (primary) hypertension: Secondary | ICD-10-CM | POA: Diagnosis not present

## 2020-10-17 DIAGNOSIS — I2510 Atherosclerotic heart disease of native coronary artery without angina pectoris: Secondary | ICD-10-CM | POA: Diagnosis not present

## 2020-10-17 DIAGNOSIS — G629 Polyneuropathy, unspecified: Secondary | ICD-10-CM | POA: Diagnosis not present

## 2020-10-17 DIAGNOSIS — H9193 Unspecified hearing loss, bilateral: Secondary | ICD-10-CM | POA: Diagnosis not present

## 2020-10-17 DIAGNOSIS — J449 Chronic obstructive pulmonary disease, unspecified: Secondary | ICD-10-CM | POA: Diagnosis not present

## 2020-10-17 DIAGNOSIS — R350 Frequency of micturition: Secondary | ICD-10-CM | POA: Diagnosis not present

## 2020-10-17 DIAGNOSIS — I252 Old myocardial infarction: Secondary | ICD-10-CM | POA: Diagnosis not present

## 2020-10-17 DIAGNOSIS — M419 Scoliosis, unspecified: Secondary | ICD-10-CM | POA: Diagnosis not present

## 2020-10-17 DIAGNOSIS — G20 Parkinson's disease: Secondary | ICD-10-CM | POA: Diagnosis not present

## 2020-10-17 DIAGNOSIS — I700 Atherosclerosis of aorta: Secondary | ICD-10-CM | POA: Diagnosis not present

## 2020-10-17 DIAGNOSIS — A4151 Sepsis due to Escherichia coli [E. coli]: Secondary | ICD-10-CM | POA: Diagnosis not present

## 2020-10-17 DIAGNOSIS — E785 Hyperlipidemia, unspecified: Secondary | ICD-10-CM | POA: Diagnosis not present

## 2020-10-18 DIAGNOSIS — A4151 Sepsis due to Escherichia coli [E. coli]: Secondary | ICD-10-CM | POA: Diagnosis not present

## 2020-10-18 DIAGNOSIS — I2510 Atherosclerotic heart disease of native coronary artery without angina pectoris: Secondary | ICD-10-CM | POA: Diagnosis not present

## 2020-10-18 DIAGNOSIS — I10 Essential (primary) hypertension: Secondary | ICD-10-CM | POA: Diagnosis not present

## 2020-10-18 DIAGNOSIS — M1990 Unspecified osteoarthritis, unspecified site: Secondary | ICD-10-CM | POA: Diagnosis not present

## 2020-10-18 DIAGNOSIS — N39498 Other specified urinary incontinence: Secondary | ICD-10-CM | POA: Diagnosis not present

## 2020-10-18 DIAGNOSIS — M419 Scoliosis, unspecified: Secondary | ICD-10-CM | POA: Diagnosis not present

## 2020-10-18 DIAGNOSIS — G9341 Metabolic encephalopathy: Secondary | ICD-10-CM | POA: Diagnosis not present

## 2020-10-18 DIAGNOSIS — I700 Atherosclerosis of aorta: Secondary | ICD-10-CM | POA: Diagnosis not present

## 2020-10-18 DIAGNOSIS — H9193 Unspecified hearing loss, bilateral: Secondary | ICD-10-CM | POA: Diagnosis not present

## 2020-10-18 DIAGNOSIS — R69 Illness, unspecified: Secondary | ICD-10-CM | POA: Diagnosis not present

## 2020-10-18 DIAGNOSIS — K219 Gastro-esophageal reflux disease without esophagitis: Secondary | ICD-10-CM | POA: Diagnosis not present

## 2020-10-18 DIAGNOSIS — Z7982 Long term (current) use of aspirin: Secondary | ICD-10-CM | POA: Diagnosis not present

## 2020-10-18 DIAGNOSIS — N320 Bladder-neck obstruction: Secondary | ICD-10-CM | POA: Diagnosis not present

## 2020-10-18 DIAGNOSIS — N3000 Acute cystitis without hematuria: Secondary | ICD-10-CM | POA: Diagnosis not present

## 2020-10-18 DIAGNOSIS — G20 Parkinson's disease: Secondary | ICD-10-CM | POA: Diagnosis not present

## 2020-10-18 DIAGNOSIS — E876 Hypokalemia: Secondary | ICD-10-CM | POA: Diagnosis not present

## 2020-10-18 DIAGNOSIS — G4733 Obstructive sleep apnea (adult) (pediatric): Secondary | ICD-10-CM | POA: Diagnosis not present

## 2020-10-18 DIAGNOSIS — H3530 Unspecified macular degeneration: Secondary | ICD-10-CM | POA: Diagnosis not present

## 2020-10-18 DIAGNOSIS — J449 Chronic obstructive pulmonary disease, unspecified: Secondary | ICD-10-CM | POA: Diagnosis not present

## 2020-10-18 DIAGNOSIS — I252 Old myocardial infarction: Secondary | ICD-10-CM | POA: Diagnosis not present

## 2020-10-18 DIAGNOSIS — R350 Frequency of micturition: Secondary | ICD-10-CM | POA: Diagnosis not present

## 2020-10-18 DIAGNOSIS — G629 Polyneuropathy, unspecified: Secondary | ICD-10-CM | POA: Diagnosis not present

## 2020-10-18 DIAGNOSIS — N401 Enlarged prostate with lower urinary tract symptoms: Secondary | ICD-10-CM | POA: Diagnosis not present

## 2020-10-18 DIAGNOSIS — E785 Hyperlipidemia, unspecified: Secondary | ICD-10-CM | POA: Diagnosis not present

## 2020-10-21 DIAGNOSIS — M419 Scoliosis, unspecified: Secondary | ICD-10-CM | POA: Diagnosis not present

## 2020-10-21 DIAGNOSIS — N39498 Other specified urinary incontinence: Secondary | ICD-10-CM | POA: Diagnosis not present

## 2020-10-21 DIAGNOSIS — K219 Gastro-esophageal reflux disease without esophagitis: Secondary | ICD-10-CM | POA: Diagnosis not present

## 2020-10-21 DIAGNOSIS — N401 Enlarged prostate with lower urinary tract symptoms: Secondary | ICD-10-CM | POA: Diagnosis not present

## 2020-10-21 DIAGNOSIS — E785 Hyperlipidemia, unspecified: Secondary | ICD-10-CM | POA: Diagnosis not present

## 2020-10-21 DIAGNOSIS — I10 Essential (primary) hypertension: Secondary | ICD-10-CM | POA: Diagnosis not present

## 2020-10-21 DIAGNOSIS — R69 Illness, unspecified: Secondary | ICD-10-CM | POA: Diagnosis not present

## 2020-10-21 DIAGNOSIS — E876 Hypokalemia: Secondary | ICD-10-CM | POA: Diagnosis not present

## 2020-10-21 DIAGNOSIS — I2510 Atherosclerotic heart disease of native coronary artery without angina pectoris: Secondary | ICD-10-CM | POA: Diagnosis not present

## 2020-10-21 DIAGNOSIS — A4151 Sepsis due to Escherichia coli [E. coli]: Secondary | ICD-10-CM | POA: Diagnosis not present

## 2020-10-21 DIAGNOSIS — Z7982 Long term (current) use of aspirin: Secondary | ICD-10-CM | POA: Diagnosis not present

## 2020-10-21 DIAGNOSIS — G629 Polyneuropathy, unspecified: Secondary | ICD-10-CM | POA: Diagnosis not present

## 2020-10-21 DIAGNOSIS — I252 Old myocardial infarction: Secondary | ICD-10-CM | POA: Diagnosis not present

## 2020-10-21 DIAGNOSIS — G9341 Metabolic encephalopathy: Secondary | ICD-10-CM | POA: Diagnosis not present

## 2020-10-21 DIAGNOSIS — G20 Parkinson's disease: Secondary | ICD-10-CM | POA: Diagnosis not present

## 2020-10-21 DIAGNOSIS — J449 Chronic obstructive pulmonary disease, unspecified: Secondary | ICD-10-CM | POA: Diagnosis not present

## 2020-10-21 DIAGNOSIS — G4733 Obstructive sleep apnea (adult) (pediatric): Secondary | ICD-10-CM | POA: Diagnosis not present

## 2020-10-21 DIAGNOSIS — H9193 Unspecified hearing loss, bilateral: Secondary | ICD-10-CM | POA: Diagnosis not present

## 2020-10-21 DIAGNOSIS — I700 Atherosclerosis of aorta: Secondary | ICD-10-CM | POA: Diagnosis not present

## 2020-10-21 DIAGNOSIS — H3530 Unspecified macular degeneration: Secondary | ICD-10-CM | POA: Diagnosis not present

## 2020-10-21 DIAGNOSIS — N320 Bladder-neck obstruction: Secondary | ICD-10-CM | POA: Diagnosis not present

## 2020-10-21 DIAGNOSIS — R350 Frequency of micturition: Secondary | ICD-10-CM | POA: Diagnosis not present

## 2020-10-21 DIAGNOSIS — N3000 Acute cystitis without hematuria: Secondary | ICD-10-CM | POA: Diagnosis not present

## 2020-10-21 DIAGNOSIS — M1990 Unspecified osteoarthritis, unspecified site: Secondary | ICD-10-CM | POA: Diagnosis not present

## 2020-10-23 DIAGNOSIS — A4151 Sepsis due to Escherichia coli [E. coli]: Secondary | ICD-10-CM | POA: Diagnosis not present

## 2020-10-23 DIAGNOSIS — M419 Scoliosis, unspecified: Secondary | ICD-10-CM | POA: Diagnosis not present

## 2020-10-23 DIAGNOSIS — J449 Chronic obstructive pulmonary disease, unspecified: Secondary | ICD-10-CM | POA: Diagnosis not present

## 2020-10-23 DIAGNOSIS — I10 Essential (primary) hypertension: Secondary | ICD-10-CM | POA: Diagnosis not present

## 2020-10-23 DIAGNOSIS — E785 Hyperlipidemia, unspecified: Secondary | ICD-10-CM | POA: Diagnosis not present

## 2020-10-23 DIAGNOSIS — K219 Gastro-esophageal reflux disease without esophagitis: Secondary | ICD-10-CM | POA: Diagnosis not present

## 2020-10-23 DIAGNOSIS — I252 Old myocardial infarction: Secondary | ICD-10-CM | POA: Diagnosis not present

## 2020-10-23 DIAGNOSIS — N320 Bladder-neck obstruction: Secondary | ICD-10-CM | POA: Diagnosis not present

## 2020-10-23 DIAGNOSIS — N401 Enlarged prostate with lower urinary tract symptoms: Secondary | ICD-10-CM | POA: Diagnosis not present

## 2020-10-23 DIAGNOSIS — G9341 Metabolic encephalopathy: Secondary | ICD-10-CM | POA: Diagnosis not present

## 2020-10-23 DIAGNOSIS — I700 Atherosclerosis of aorta: Secondary | ICD-10-CM | POA: Diagnosis not present

## 2020-10-23 DIAGNOSIS — E876 Hypokalemia: Secondary | ICD-10-CM | POA: Diagnosis not present

## 2020-10-23 DIAGNOSIS — H9193 Unspecified hearing loss, bilateral: Secondary | ICD-10-CM | POA: Diagnosis not present

## 2020-10-23 DIAGNOSIS — G20 Parkinson's disease: Secondary | ICD-10-CM | POA: Diagnosis not present

## 2020-10-23 DIAGNOSIS — H3530 Unspecified macular degeneration: Secondary | ICD-10-CM | POA: Diagnosis not present

## 2020-10-23 DIAGNOSIS — Z7982 Long term (current) use of aspirin: Secondary | ICD-10-CM | POA: Diagnosis not present

## 2020-10-23 DIAGNOSIS — N3000 Acute cystitis without hematuria: Secondary | ICD-10-CM | POA: Diagnosis not present

## 2020-10-23 DIAGNOSIS — N39498 Other specified urinary incontinence: Secondary | ICD-10-CM | POA: Diagnosis not present

## 2020-10-23 DIAGNOSIS — G4733 Obstructive sleep apnea (adult) (pediatric): Secondary | ICD-10-CM | POA: Diagnosis not present

## 2020-10-23 DIAGNOSIS — I2510 Atherosclerotic heart disease of native coronary artery without angina pectoris: Secondary | ICD-10-CM | POA: Diagnosis not present

## 2020-10-23 DIAGNOSIS — G629 Polyneuropathy, unspecified: Secondary | ICD-10-CM | POA: Diagnosis not present

## 2020-10-23 DIAGNOSIS — R69 Illness, unspecified: Secondary | ICD-10-CM | POA: Diagnosis not present

## 2020-10-23 DIAGNOSIS — R350 Frequency of micturition: Secondary | ICD-10-CM | POA: Diagnosis not present

## 2020-10-23 DIAGNOSIS — M1990 Unspecified osteoarthritis, unspecified site: Secondary | ICD-10-CM | POA: Diagnosis not present

## 2020-10-24 DIAGNOSIS — N320 Bladder-neck obstruction: Secondary | ICD-10-CM | POA: Diagnosis not present

## 2020-10-24 DIAGNOSIS — G4733 Obstructive sleep apnea (adult) (pediatric): Secondary | ICD-10-CM | POA: Diagnosis not present

## 2020-10-24 DIAGNOSIS — G20 Parkinson's disease: Secondary | ICD-10-CM | POA: Diagnosis not present

## 2020-10-24 DIAGNOSIS — N401 Enlarged prostate with lower urinary tract symptoms: Secondary | ICD-10-CM | POA: Diagnosis not present

## 2020-10-24 DIAGNOSIS — I700 Atherosclerosis of aorta: Secondary | ICD-10-CM | POA: Diagnosis not present

## 2020-10-24 DIAGNOSIS — E785 Hyperlipidemia, unspecified: Secondary | ICD-10-CM | POA: Diagnosis not present

## 2020-10-24 DIAGNOSIS — M419 Scoliosis, unspecified: Secondary | ICD-10-CM | POA: Diagnosis not present

## 2020-10-24 DIAGNOSIS — I10 Essential (primary) hypertension: Secondary | ICD-10-CM | POA: Diagnosis not present

## 2020-10-24 DIAGNOSIS — J449 Chronic obstructive pulmonary disease, unspecified: Secondary | ICD-10-CM | POA: Diagnosis not present

## 2020-10-24 DIAGNOSIS — G629 Polyneuropathy, unspecified: Secondary | ICD-10-CM | POA: Diagnosis not present

## 2020-10-24 DIAGNOSIS — M1990 Unspecified osteoarthritis, unspecified site: Secondary | ICD-10-CM | POA: Diagnosis not present

## 2020-10-24 DIAGNOSIS — R69 Illness, unspecified: Secondary | ICD-10-CM | POA: Diagnosis not present

## 2020-10-24 DIAGNOSIS — R350 Frequency of micturition: Secondary | ICD-10-CM | POA: Diagnosis not present

## 2020-10-24 DIAGNOSIS — I2510 Atherosclerotic heart disease of native coronary artery without angina pectoris: Secondary | ICD-10-CM | POA: Diagnosis not present

## 2020-10-24 DIAGNOSIS — H3530 Unspecified macular degeneration: Secondary | ICD-10-CM | POA: Diagnosis not present

## 2020-10-24 DIAGNOSIS — F0281 Dementia in other diseases classified elsewhere with behavioral disturbance: Secondary | ICD-10-CM | POA: Diagnosis not present

## 2020-10-24 DIAGNOSIS — H9193 Unspecified hearing loss, bilateral: Secondary | ICD-10-CM | POA: Diagnosis not present

## 2020-10-24 DIAGNOSIS — E876 Hypokalemia: Secondary | ICD-10-CM | POA: Diagnosis not present

## 2020-10-24 DIAGNOSIS — N39498 Other specified urinary incontinence: Secondary | ICD-10-CM | POA: Diagnosis not present

## 2020-10-24 DIAGNOSIS — K219 Gastro-esophageal reflux disease without esophagitis: Secondary | ICD-10-CM | POA: Diagnosis not present

## 2020-10-24 DIAGNOSIS — N3000 Acute cystitis without hematuria: Secondary | ICD-10-CM | POA: Diagnosis not present

## 2020-10-24 DIAGNOSIS — I252 Old myocardial infarction: Secondary | ICD-10-CM | POA: Diagnosis not present

## 2020-10-24 DIAGNOSIS — G9341 Metabolic encephalopathy: Secondary | ICD-10-CM | POA: Diagnosis not present

## 2020-10-24 DIAGNOSIS — Z7982 Long term (current) use of aspirin: Secondary | ICD-10-CM | POA: Diagnosis not present

## 2020-10-24 DIAGNOSIS — A4151 Sepsis due to Escherichia coli [E. coli]: Secondary | ICD-10-CM | POA: Diagnosis not present

## 2020-10-31 DIAGNOSIS — R69 Illness, unspecified: Secondary | ICD-10-CM | POA: Diagnosis not present

## 2020-10-31 DIAGNOSIS — E876 Hypokalemia: Secondary | ICD-10-CM | POA: Diagnosis not present

## 2020-10-31 DIAGNOSIS — I252 Old myocardial infarction: Secondary | ICD-10-CM | POA: Diagnosis not present

## 2020-10-31 DIAGNOSIS — K219 Gastro-esophageal reflux disease without esophagitis: Secondary | ICD-10-CM | POA: Diagnosis not present

## 2020-10-31 DIAGNOSIS — M419 Scoliosis, unspecified: Secondary | ICD-10-CM | POA: Diagnosis not present

## 2020-10-31 DIAGNOSIS — J449 Chronic obstructive pulmonary disease, unspecified: Secondary | ICD-10-CM | POA: Diagnosis not present

## 2020-10-31 DIAGNOSIS — I700 Atherosclerosis of aorta: Secondary | ICD-10-CM | POA: Diagnosis not present

## 2020-10-31 DIAGNOSIS — Z7982 Long term (current) use of aspirin: Secondary | ICD-10-CM | POA: Diagnosis not present

## 2020-10-31 DIAGNOSIS — G4733 Obstructive sleep apnea (adult) (pediatric): Secondary | ICD-10-CM | POA: Diagnosis not present

## 2020-10-31 DIAGNOSIS — G20 Parkinson's disease: Secondary | ICD-10-CM | POA: Diagnosis not present

## 2020-10-31 DIAGNOSIS — N320 Bladder-neck obstruction: Secondary | ICD-10-CM | POA: Diagnosis not present

## 2020-10-31 DIAGNOSIS — N3000 Acute cystitis without hematuria: Secondary | ICD-10-CM | POA: Diagnosis not present

## 2020-10-31 DIAGNOSIS — H9193 Unspecified hearing loss, bilateral: Secondary | ICD-10-CM | POA: Diagnosis not present

## 2020-10-31 DIAGNOSIS — I2510 Atherosclerotic heart disease of native coronary artery without angina pectoris: Secondary | ICD-10-CM | POA: Diagnosis not present

## 2020-10-31 DIAGNOSIS — H3530 Unspecified macular degeneration: Secondary | ICD-10-CM | POA: Diagnosis not present

## 2020-10-31 DIAGNOSIS — G629 Polyneuropathy, unspecified: Secondary | ICD-10-CM | POA: Diagnosis not present

## 2020-10-31 DIAGNOSIS — M1990 Unspecified osteoarthritis, unspecified site: Secondary | ICD-10-CM | POA: Diagnosis not present

## 2020-10-31 DIAGNOSIS — G9341 Metabolic encephalopathy: Secondary | ICD-10-CM | POA: Diagnosis not present

## 2020-10-31 DIAGNOSIS — E785 Hyperlipidemia, unspecified: Secondary | ICD-10-CM | POA: Diagnosis not present

## 2020-10-31 DIAGNOSIS — R350 Frequency of micturition: Secondary | ICD-10-CM | POA: Diagnosis not present

## 2020-10-31 DIAGNOSIS — N401 Enlarged prostate with lower urinary tract symptoms: Secondary | ICD-10-CM | POA: Diagnosis not present

## 2020-10-31 DIAGNOSIS — A4151 Sepsis due to Escherichia coli [E. coli]: Secondary | ICD-10-CM | POA: Diagnosis not present

## 2020-10-31 DIAGNOSIS — F0281 Dementia in other diseases classified elsewhere with behavioral disturbance: Secondary | ICD-10-CM | POA: Diagnosis not present

## 2020-10-31 DIAGNOSIS — I10 Essential (primary) hypertension: Secondary | ICD-10-CM | POA: Diagnosis not present

## 2020-10-31 DIAGNOSIS — N39498 Other specified urinary incontinence: Secondary | ICD-10-CM | POA: Diagnosis not present

## 2020-11-01 DIAGNOSIS — F0281 Dementia in other diseases classified elsewhere with behavioral disturbance: Secondary | ICD-10-CM | POA: Diagnosis not present

## 2020-11-01 DIAGNOSIS — N3000 Acute cystitis without hematuria: Secondary | ICD-10-CM | POA: Diagnosis not present

## 2020-11-01 DIAGNOSIS — R69 Illness, unspecified: Secondary | ICD-10-CM | POA: Diagnosis not present

## 2020-11-01 DIAGNOSIS — M1990 Unspecified osteoarthritis, unspecified site: Secondary | ICD-10-CM | POA: Diagnosis not present

## 2020-11-01 DIAGNOSIS — K219 Gastro-esophageal reflux disease without esophagitis: Secondary | ICD-10-CM | POA: Diagnosis not present

## 2020-11-01 DIAGNOSIS — J449 Chronic obstructive pulmonary disease, unspecified: Secondary | ICD-10-CM | POA: Diagnosis not present

## 2020-11-01 DIAGNOSIS — I252 Old myocardial infarction: Secondary | ICD-10-CM | POA: Diagnosis not present

## 2020-11-01 DIAGNOSIS — N401 Enlarged prostate with lower urinary tract symptoms: Secondary | ICD-10-CM | POA: Diagnosis not present

## 2020-11-01 DIAGNOSIS — N320 Bladder-neck obstruction: Secondary | ICD-10-CM | POA: Diagnosis not present

## 2020-11-01 DIAGNOSIS — E785 Hyperlipidemia, unspecified: Secondary | ICD-10-CM | POA: Diagnosis not present

## 2020-11-01 DIAGNOSIS — G20 Parkinson's disease: Secondary | ICD-10-CM | POA: Diagnosis not present

## 2020-11-01 DIAGNOSIS — R350 Frequency of micturition: Secondary | ICD-10-CM | POA: Diagnosis not present

## 2020-11-01 DIAGNOSIS — E876 Hypokalemia: Secondary | ICD-10-CM | POA: Diagnosis not present

## 2020-11-01 DIAGNOSIS — I10 Essential (primary) hypertension: Secondary | ICD-10-CM | POA: Diagnosis not present

## 2020-11-01 DIAGNOSIS — H9193 Unspecified hearing loss, bilateral: Secondary | ICD-10-CM | POA: Diagnosis not present

## 2020-11-01 DIAGNOSIS — I700 Atherosclerosis of aorta: Secondary | ICD-10-CM | POA: Diagnosis not present

## 2020-11-01 DIAGNOSIS — N39498 Other specified urinary incontinence: Secondary | ICD-10-CM | POA: Diagnosis not present

## 2020-11-01 DIAGNOSIS — H3530 Unspecified macular degeneration: Secondary | ICD-10-CM | POA: Diagnosis not present

## 2020-11-01 DIAGNOSIS — A4151 Sepsis due to Escherichia coli [E. coli]: Secondary | ICD-10-CM | POA: Diagnosis not present

## 2020-11-01 DIAGNOSIS — M419 Scoliosis, unspecified: Secondary | ICD-10-CM | POA: Diagnosis not present

## 2020-11-01 DIAGNOSIS — G4733 Obstructive sleep apnea (adult) (pediatric): Secondary | ICD-10-CM | POA: Diagnosis not present

## 2020-11-01 DIAGNOSIS — Z7982 Long term (current) use of aspirin: Secondary | ICD-10-CM | POA: Diagnosis not present

## 2020-11-01 DIAGNOSIS — G9341 Metabolic encephalopathy: Secondary | ICD-10-CM | POA: Diagnosis not present

## 2020-11-01 DIAGNOSIS — I2510 Atherosclerotic heart disease of native coronary artery without angina pectoris: Secondary | ICD-10-CM | POA: Diagnosis not present

## 2020-11-01 DIAGNOSIS — G629 Polyneuropathy, unspecified: Secondary | ICD-10-CM | POA: Diagnosis not present

## 2020-11-04 ENCOUNTER — Ambulatory Visit (INDEPENDENT_AMBULATORY_CARE_PROVIDER_SITE_OTHER): Payer: Medicare HMO | Admitting: Nurse Practitioner

## 2020-11-04 ENCOUNTER — Telehealth: Payer: Self-pay | Admitting: Nurse Practitioner

## 2020-11-04 ENCOUNTER — Other Ambulatory Visit: Payer: Self-pay

## 2020-11-04 ENCOUNTER — Encounter: Payer: Self-pay | Admitting: Nurse Practitioner

## 2020-11-04 VITALS — BP 142/78 | HR 71 | Temp 98.6°F | Resp 18

## 2020-11-04 DIAGNOSIS — A4151 Sepsis due to Escherichia coli [E. coli]: Secondary | ICD-10-CM | POA: Diagnosis not present

## 2020-11-04 DIAGNOSIS — M1990 Unspecified osteoarthritis, unspecified site: Secondary | ICD-10-CM | POA: Diagnosis not present

## 2020-11-04 DIAGNOSIS — N39498 Other specified urinary incontinence: Secondary | ICD-10-CM | POA: Diagnosis not present

## 2020-11-04 DIAGNOSIS — N1831 Chronic kidney disease, stage 3a: Secondary | ICD-10-CM | POA: Diagnosis not present

## 2020-11-04 DIAGNOSIS — G4733 Obstructive sleep apnea (adult) (pediatric): Secondary | ICD-10-CM | POA: Diagnosis not present

## 2020-11-04 DIAGNOSIS — H3530 Unspecified macular degeneration: Secondary | ICD-10-CM | POA: Diagnosis not present

## 2020-11-04 DIAGNOSIS — D692 Other nonthrombocytopenic purpura: Secondary | ICD-10-CM | POA: Diagnosis not present

## 2020-11-04 DIAGNOSIS — I25810 Atherosclerosis of coronary artery bypass graft(s) without angina pectoris: Secondary | ICD-10-CM | POA: Diagnosis not present

## 2020-11-04 DIAGNOSIS — F0280 Dementia in other diseases classified elsewhere without behavioral disturbance: Secondary | ICD-10-CM

## 2020-11-04 DIAGNOSIS — Z7982 Long term (current) use of aspirin: Secondary | ICD-10-CM | POA: Diagnosis not present

## 2020-11-04 DIAGNOSIS — N3000 Acute cystitis without hematuria: Secondary | ICD-10-CM | POA: Diagnosis not present

## 2020-11-04 DIAGNOSIS — I10 Essential (primary) hypertension: Secondary | ICD-10-CM

## 2020-11-04 DIAGNOSIS — J449 Chronic obstructive pulmonary disease, unspecified: Secondary | ICD-10-CM | POA: Diagnosis not present

## 2020-11-04 DIAGNOSIS — F0281 Dementia in other diseases classified elsewhere with behavioral disturbance: Secondary | ICD-10-CM | POA: Diagnosis not present

## 2020-11-04 DIAGNOSIS — I700 Atherosclerosis of aorta: Secondary | ICD-10-CM | POA: Diagnosis not present

## 2020-11-04 DIAGNOSIS — H9193 Unspecified hearing loss, bilateral: Secondary | ICD-10-CM | POA: Diagnosis not present

## 2020-11-04 DIAGNOSIS — G629 Polyneuropathy, unspecified: Secondary | ICD-10-CM | POA: Diagnosis not present

## 2020-11-04 DIAGNOSIS — E785 Hyperlipidemia, unspecified: Secondary | ICD-10-CM | POA: Diagnosis not present

## 2020-11-04 DIAGNOSIS — G20 Parkinson's disease: Secondary | ICD-10-CM

## 2020-11-04 DIAGNOSIS — M4155 Other secondary scoliosis, thoracolumbar region: Secondary | ICD-10-CM | POA: Diagnosis not present

## 2020-11-04 DIAGNOSIS — I252 Old myocardial infarction: Secondary | ICD-10-CM | POA: Diagnosis not present

## 2020-11-04 DIAGNOSIS — M419 Scoliosis, unspecified: Secondary | ICD-10-CM | POA: Diagnosis not present

## 2020-11-04 DIAGNOSIS — Z9181 History of falling: Secondary | ICD-10-CM | POA: Diagnosis not present

## 2020-11-04 DIAGNOSIS — R350 Frequency of micturition: Secondary | ICD-10-CM | POA: Diagnosis not present

## 2020-11-04 DIAGNOSIS — N401 Enlarged prostate with lower urinary tract symptoms: Secondary | ICD-10-CM | POA: Diagnosis not present

## 2020-11-04 DIAGNOSIS — R69 Illness, unspecified: Secondary | ICD-10-CM | POA: Diagnosis not present

## 2020-11-04 DIAGNOSIS — I2510 Atherosclerotic heart disease of native coronary artery without angina pectoris: Secondary | ICD-10-CM | POA: Diagnosis not present

## 2020-11-04 DIAGNOSIS — N320 Bladder-neck obstruction: Secondary | ICD-10-CM | POA: Diagnosis not present

## 2020-11-04 DIAGNOSIS — E876 Hypokalemia: Secondary | ICD-10-CM | POA: Diagnosis not present

## 2020-11-04 DIAGNOSIS — K219 Gastro-esophageal reflux disease without esophagitis: Secondary | ICD-10-CM | POA: Diagnosis not present

## 2020-11-04 DIAGNOSIS — G9341 Metabolic encephalopathy: Secondary | ICD-10-CM | POA: Diagnosis not present

## 2020-11-04 NOTE — Progress Notes (Signed)
BP (!) 142/78 (BP Location: Left Arm, Patient Position: Sitting)   Pulse 71   Temp 98.6 F (37 C) (Oral)   Resp 18   SpO2 100%    Subjective:    Patient ID: Jason Azucena Fallen., male    DOB: Feb 24, 1928, 87 y.o.   MRN: [id number]  HPI: Jason W Avigdor Wroble. is a 87 y.o. male  Chief Complaint  Patient presents with   Hypertension   Dementia   Cyst    On his back. Had for about a year   DEMENTIA: Is followed by neurology.  At baseline has hallucinations.  Gabapentin increased 300 MG QID by neurology at recent visit on 10/03/20, Dr. Melrose Nakayama. Continues on Aricept every day.  Last MRI brain was on 08/09/20 with moderate to severe cerebral white matter vessel disease.  Palliative has been visiting as of recently, last visit in June.  Saw cardiology last on 10/03/20 for hypertension and CAD, past orthostatic readings, discussed stopping ARB, but at this time wife reports they are maintaining this.    His wife does endorse some burn out as caregiver.  He eats three meals a day, but this is difficult due to underlying vision issues, dinner being the most difficult.  Weight in January 2020 was 176 and last cardiology visit 166, his wife reports he still has a good appetite. Has been drinking Ensure daily, if drinks more then this gets diarrhea and bloating.  Sleeping well at night at this time, improved with increase in Gabapentin -- she does endorse he is sun downing more. Wife reports his knees give out sometimes, tires easily with this, no recent falls.  He does not drink much water at home, this has been discussed multiple times.  Physical therapy and occupational therapy coming into home -- wife reports patient will not continue to practice therapy when they are not there.     Has macular degeneration -- last saw eye doctor 11/30/19.  Has had 3 different prescriptions in the past year, stopped shots one year ago.  Has seen Dr. Ellin Mayhew.  SKIN KNOT Continues to discuss cyst on this back, has  known scoliosis with intermittent back discomfort. Area is bony prominence. Duration: months Location:  Painful: no Itching: no Onset: gradual Context: not changing Associated signs and symptoms: none History of skin cancer: no History of precancerous skin lesions: no Family history of skin cancer: no   CHRONIC KIDNEY DISEASE Has been noticed on recent labs.  Sees urology for urinary retention issues and last saw on 09/27/20. CKD status: stable Medications renally dose: yes Previous renal evaluation: no Pneumovax:  Up to Date Influenza Vaccine:  Not up to Date -refuses today    Relevant past medical, surgical, family and social history reviewed and updated as indicated. Interim medical history since our last visit reviewed. Allergies and medications reviewed and updated.  Review of Systems  Constitutional:  Negative for activity change, diaphoresis, fatigue and fever.  Respiratory:  Negative for cough, chest tightness, shortness of breath and wheezing.   Cardiovascular:  Negative for chest pain, palpitations and leg swelling.  Gastrointestinal: Negative.   Neurological: Negative.   Psychiatric/Behavioral: Negative.     Per HPI unless specifically indicated above     Objective:    BP (!) 142/78 (BP Location: Left Arm, Patient Position: Sitting)   Pulse 71   Temp 98.6 F (37 C) (Oral)   Resp 18   SpO2 100%   Wt Readings from Last 3 Encounters:  09/27/20  161 lb (73 kg)  09/06/20 160 lb (72.6 kg)  09/06/20 167 lb 3.2 oz (75.8 kg)    Physical Exam Vitals and nursing note reviewed.  Constitutional:      General: He is awake. He is not in acute distress.    Appearance: He is well-developed, well-groomed and underweight. He is not ill-appearing or toxic-appearing.  HENT:     Head: Normocephalic and atraumatic.     Right Ear: Hearing normal. No drainage.     Left Ear: Hearing normal. No drainage.  Eyes:     General: Lids are normal.        Right eye: No discharge.         Left eye: No discharge.     Conjunctiva/sclera: Conjunctivae normal.     Pupils: Pupils are equal, round, and reactive to light.  Neck:     Thyroid: No thyromegaly.     Vascular: No carotid bruit.  Cardiovascular:     Rate and Rhythm: Normal rate and regular rhythm.     Heart sounds: Normal heart sounds, S1 normal and S2 normal. No murmur heard.   No gallop.  Pulmonary:     Effort: Pulmonary effort is normal. No accessory muscle usage or respiratory distress.     Breath sounds: Normal breath sounds.  Abdominal:     General: Bowel sounds are normal. There is no distension.     Palpations: Abdomen is soft. There is no hepatomegaly or splenomegaly.     Tenderness: There is no abdominal tenderness. There is no right CVA tenderness or left CVA tenderness.     Hernia: No hernia is present.  Musculoskeletal:        General: Normal range of motion.     Cervical back: Normal range of motion and neck supple.     Right lower leg: No edema.     Left lower leg: No edema.     Comments: Bony prominence at baseline to mid back.  Skin:    General: Skin is warm and dry.     Capillary Refill: Capillary refill takes less than 2 seconds.     Comments: Scattered pale purple bruises to bilateral upper extremities.  Neurological:     Mental Status: He is alert.     Deep Tendon Reflexes: Reflexes are normal and symmetric.     Comments: Pleasantly confused (dementia).  He is oriented to month and year + location, not day of week.    Psychiatric:        Attention and Perception: Attention normal.        Mood and Affect: Mood normal.        Speech: Speech normal.        Behavior: Behavior normal. Behavior is cooperative.   Results for orders placed or performed in visit on 09/27/20  Bladder Scan (Post Void Residual) in office  Result Value Ref Range   Scan Result 114m       Assessment & Plan:   Problem List Items Addressed This Visit       Cardiovascular and Mediastinum   Hypertension     Chronic, ongoing, followed by cardiology with recent visit.  Continue current medication regimen at this time, however discussed with his wife holding Losartan if BP <110/60 at home due to concern for orthostatic hypotension and falls in patient with dementia and advanced age + poor hydration.  Recommend increase fluid intake at home, getting at least 8 glasses of water a day.  CMP and CBC  next visit.  Return in 3 months.      Coronary artery disease involving coronary bypass graft of native heart without angina pectoris    Chronic, ongoing.  Continue to follow with cardiology and current medication regimen.      Senile purpura ([place])    Noted on exam, recommend gentle skin care at home and monitor closely for skin breakdown, alert immediately if presents.        Nervous and Auditory   Dementia due to Parkinson's disease without behavioral disturbance (HCC) - Primary    Chronic, progressive.  Discussed with his wife progressive nature of disease process, continue Donepezil 10 MG.  Continue to collaborate with neurology.  Monitor closely for falls and continue PT for fall prevention and strengthening if increased falls.  Continue to collaborate with palliative. Recommend continue 10 MG Melatonin for sleep aide as needed. Return in 3 months, sooner if worsening symptoms.  For mood may need to trial addition of Zoloft at low dose in future.        Musculoskeletal and Integument   Scoliosis of thoracolumbar spine    Noted on imaging, patient with very leaned posture and shuffle gait. Continue to work with PT at home.        Genitourinary   CKD (chronic kidney disease) stage 3, GFR 30-59 ml/min (HCC)    Ongoing and stable, continue Losartan for kidney protection.  CMP next visit.  Refer to nephrology as needed.        Other   At high risk for falls    Physical therapy in place and recommend he consistently use walker at home.        Follow up plan: Return in about 3 months (around  02/03/2021).

## 2020-11-04 NOTE — Assessment & Plan Note (Signed)
Noted on exam, recommend gentle skin care at home and monitor closely for skin breakdown, alert immediately if presents.

## 2020-11-04 NOTE — Assessment & Plan Note (Signed)
Noted on imaging, patient with very leaned posture and shuffle gait. Continue to work with PT at home.

## 2020-11-04 NOTE — Telephone Encounter (Signed)
Called and LVM giving verbal orders per [name].  ?

## 2020-11-04 NOTE — Assessment & Plan Note (Signed)
Chronic, progressive.  Discussed with his wife progressive nature of disease process, continue Donepezil 10 MG.  Continue to collaborate with neurology.  Monitor closely for falls and continue PT for fall prevention and strengthening if increased falls.  Continue to collaborate with palliative. Recommend continue 10 MG Melatonin for sleep aide as needed. Return in 3 months, sooner if worsening symptoms.  For mood may need to trial addition of Zoloft at low dose in future.

## 2020-11-04 NOTE — Assessment & Plan Note (Signed)
Ongoing and stable, continue Losartan for kidney protection.  CMP next visit.  Refer to nephrology as needed.

## 2020-11-04 NOTE — Assessment & Plan Note (Signed)
Physical therapy in place and recommend he consistently use walker at home.

## 2020-11-04 NOTE — Patient Instructions (Signed)

## 2020-11-04 NOTE — Telephone Encounter (Signed)
Home Health Verbal Orders - Caller/Agency: [facility]  Callback Number: [phone] Requesting OT/PT/Skilled Nursing/Social Work/Speech Therapy: PT  Frequency: 1X9

## 2020-11-04 NOTE — Assessment & Plan Note (Signed)
Chronic, ongoing, followed by cardiology with recent visit.  Continue current medication regimen at this time, however discussed with his wife holding Losartan if BP <110/60 at home due to concern for orthostatic hypotension and falls in patient with dementia and advanced age + poor hydration.  Recommend increase fluid intake at home, getting at least 8 glasses of water a day.  CMP and CBC next visit.  Return in 3 months.

## 2020-11-04 NOTE — Assessment & Plan Note (Signed)
Chronic, ongoing.  Continue to follow with cardiology and current medication regimen.

## 2020-11-05 ENCOUNTER — Telehealth: Payer: Self-pay

## 2020-11-05 NOTE — Telephone Encounter (Signed)
Copied from [place] [phone]. Topic: Quick Communication - Home Health Verbal Orders >> Nov 05, 2020  9:56 AM Loma [place] wrote: Caller/Agency: [facility] Number: [phone]  Requesting OT Therapy: Pls extend for 1 time a week for 2 wks Frequency: 1 wk 2

## 2020-11-05 NOTE — Telephone Encounter (Signed)
Spoke with Marlowe Kays from [facility] and provided her with verbal orders for the patient. Marlowe Kays verbalized understanding and has no further questions at this time.

## 2020-11-07 DIAGNOSIS — G629 Polyneuropathy, unspecified: Secondary | ICD-10-CM | POA: Diagnosis not present

## 2020-11-07 DIAGNOSIS — E876 Hypokalemia: Secondary | ICD-10-CM | POA: Diagnosis not present

## 2020-11-07 DIAGNOSIS — M1990 Unspecified osteoarthritis, unspecified site: Secondary | ICD-10-CM | POA: Diagnosis not present

## 2020-11-07 DIAGNOSIS — N401 Enlarged prostate with lower urinary tract symptoms: Secondary | ICD-10-CM | POA: Diagnosis not present

## 2020-11-07 DIAGNOSIS — E785 Hyperlipidemia, unspecified: Secondary | ICD-10-CM | POA: Diagnosis not present

## 2020-11-07 DIAGNOSIS — M419 Scoliosis, unspecified: Secondary | ICD-10-CM | POA: Diagnosis not present

## 2020-11-07 DIAGNOSIS — G20 Parkinson's disease: Secondary | ICD-10-CM | POA: Diagnosis not present

## 2020-11-07 DIAGNOSIS — I700 Atherosclerosis of aorta: Secondary | ICD-10-CM | POA: Diagnosis not present

## 2020-11-07 DIAGNOSIS — H9193 Unspecified hearing loss, bilateral: Secondary | ICD-10-CM | POA: Diagnosis not present

## 2020-11-07 DIAGNOSIS — G9341 Metabolic encephalopathy: Secondary | ICD-10-CM | POA: Diagnosis not present

## 2020-11-07 DIAGNOSIS — H3530 Unspecified macular degeneration: Secondary | ICD-10-CM | POA: Diagnosis not present

## 2020-11-07 DIAGNOSIS — I2510 Atherosclerotic heart disease of native coronary artery without angina pectoris: Secondary | ICD-10-CM | POA: Diagnosis not present

## 2020-11-07 DIAGNOSIS — R350 Frequency of micturition: Secondary | ICD-10-CM | POA: Diagnosis not present

## 2020-11-07 DIAGNOSIS — J449 Chronic obstructive pulmonary disease, unspecified: Secondary | ICD-10-CM | POA: Diagnosis not present

## 2020-11-07 DIAGNOSIS — I10 Essential (primary) hypertension: Secondary | ICD-10-CM | POA: Diagnosis not present

## 2020-11-07 DIAGNOSIS — Z7982 Long term (current) use of aspirin: Secondary | ICD-10-CM | POA: Diagnosis not present

## 2020-11-07 DIAGNOSIS — G4733 Obstructive sleep apnea (adult) (pediatric): Secondary | ICD-10-CM | POA: Diagnosis not present

## 2020-11-07 DIAGNOSIS — A4151 Sepsis due to Escherichia coli [E. coli]: Secondary | ICD-10-CM | POA: Diagnosis not present

## 2020-11-07 DIAGNOSIS — I252 Old myocardial infarction: Secondary | ICD-10-CM | POA: Diagnosis not present

## 2020-11-07 DIAGNOSIS — N39498 Other specified urinary incontinence: Secondary | ICD-10-CM | POA: Diagnosis not present

## 2020-11-07 DIAGNOSIS — N320 Bladder-neck obstruction: Secondary | ICD-10-CM | POA: Diagnosis not present

## 2020-11-07 DIAGNOSIS — K219 Gastro-esophageal reflux disease without esophagitis: Secondary | ICD-10-CM | POA: Diagnosis not present

## 2020-11-07 DIAGNOSIS — N3000 Acute cystitis without hematuria: Secondary | ICD-10-CM | POA: Diagnosis not present

## 2020-11-11 ENCOUNTER — Telehealth: Payer: Self-pay | Admitting: Primary Care

## 2020-11-11 NOTE — Telephone Encounter (Signed)
T/c to confirm appt for tomorrow, no answer, message left asking for confirmation.

## 2020-11-14 ENCOUNTER — Telehealth: Payer: Self-pay | Admitting: Primary Care

## 2020-11-14 NOTE — Telephone Encounter (Signed)
Spoke with patient's wife, Jackelyn Poling, about rescheduling the previous Palliative f/u visit that was cancelled on 11/12/20.  Wife has declined any further visits with Palliative, she felt that patient was doing okay now and that they would be okay without our services.  I did tell wife not to hesitate to call us or PCP if anything changed in the future and they needed our services again and she was in agreement with this.  Will notify PCP and the Palliative Team.

## 2020-11-19 DIAGNOSIS — E785 Hyperlipidemia, unspecified: Secondary | ICD-10-CM | POA: Diagnosis not present

## 2020-11-19 DIAGNOSIS — I10 Essential (primary) hypertension: Secondary | ICD-10-CM | POA: Diagnosis not present

## 2020-11-19 DIAGNOSIS — I252 Old myocardial infarction: Secondary | ICD-10-CM | POA: Diagnosis not present

## 2020-11-19 DIAGNOSIS — N39498 Other specified urinary incontinence: Secondary | ICD-10-CM | POA: Diagnosis not present

## 2020-11-19 DIAGNOSIS — N320 Bladder-neck obstruction: Secondary | ICD-10-CM | POA: Diagnosis not present

## 2020-11-19 DIAGNOSIS — M1990 Unspecified osteoarthritis, unspecified site: Secondary | ICD-10-CM | POA: Diagnosis not present

## 2020-11-19 DIAGNOSIS — I2510 Atherosclerotic heart disease of native coronary artery without angina pectoris: Secondary | ICD-10-CM | POA: Diagnosis not present

## 2020-11-19 DIAGNOSIS — I700 Atherosclerosis of aorta: Secondary | ICD-10-CM | POA: Diagnosis not present

## 2020-11-19 DIAGNOSIS — Z7982 Long term (current) use of aspirin: Secondary | ICD-10-CM | POA: Diagnosis not present

## 2020-11-19 DIAGNOSIS — G20 Parkinson's disease: Secondary | ICD-10-CM | POA: Diagnosis not present

## 2020-11-19 DIAGNOSIS — G4733 Obstructive sleep apnea (adult) (pediatric): Secondary | ICD-10-CM | POA: Diagnosis not present

## 2020-11-19 DIAGNOSIS — G9341 Metabolic encephalopathy: Secondary | ICD-10-CM | POA: Diagnosis not present

## 2020-11-19 DIAGNOSIS — K219 Gastro-esophageal reflux disease without esophagitis: Secondary | ICD-10-CM | POA: Diagnosis not present

## 2020-11-19 DIAGNOSIS — J449 Chronic obstructive pulmonary disease, unspecified: Secondary | ICD-10-CM | POA: Diagnosis not present

## 2020-11-19 DIAGNOSIS — N3000 Acute cystitis without hematuria: Secondary | ICD-10-CM | POA: Diagnosis not present

## 2020-11-19 DIAGNOSIS — E876 Hypokalemia: Secondary | ICD-10-CM | POA: Diagnosis not present

## 2020-11-19 DIAGNOSIS — G629 Polyneuropathy, unspecified: Secondary | ICD-10-CM | POA: Diagnosis not present

## 2020-11-19 DIAGNOSIS — M419 Scoliosis, unspecified: Secondary | ICD-10-CM | POA: Diagnosis not present

## 2020-11-19 DIAGNOSIS — H9193 Unspecified hearing loss, bilateral: Secondary | ICD-10-CM | POA: Diagnosis not present

## 2020-11-19 DIAGNOSIS — R350 Frequency of micturition: Secondary | ICD-10-CM | POA: Diagnosis not present

## 2020-11-19 DIAGNOSIS — A4151 Sepsis due to Escherichia coli [E. coli]: Secondary | ICD-10-CM | POA: Diagnosis not present

## 2020-11-19 DIAGNOSIS — N401 Enlarged prostate with lower urinary tract symptoms: Secondary | ICD-10-CM | POA: Diagnosis not present

## 2020-11-19 DIAGNOSIS — H3530 Unspecified macular degeneration: Secondary | ICD-10-CM | POA: Diagnosis not present

## 2020-11-21 DIAGNOSIS — N401 Enlarged prostate with lower urinary tract symptoms: Secondary | ICD-10-CM | POA: Diagnosis not present

## 2020-11-21 DIAGNOSIS — N320 Bladder-neck obstruction: Secondary | ICD-10-CM | POA: Diagnosis not present

## 2020-11-21 DIAGNOSIS — N39498 Other specified urinary incontinence: Secondary | ICD-10-CM | POA: Diagnosis not present

## 2020-11-21 DIAGNOSIS — G9341 Metabolic encephalopathy: Secondary | ICD-10-CM | POA: Diagnosis not present

## 2020-11-21 DIAGNOSIS — R350 Frequency of micturition: Secondary | ICD-10-CM | POA: Diagnosis not present

## 2020-11-21 DIAGNOSIS — E876 Hypokalemia: Secondary | ICD-10-CM | POA: Diagnosis not present

## 2020-11-21 DIAGNOSIS — Z7982 Long term (current) use of aspirin: Secondary | ICD-10-CM | POA: Diagnosis not present

## 2020-11-21 DIAGNOSIS — M1990 Unspecified osteoarthritis, unspecified site: Secondary | ICD-10-CM | POA: Diagnosis not present

## 2020-11-21 DIAGNOSIS — G629 Polyneuropathy, unspecified: Secondary | ICD-10-CM | POA: Diagnosis not present

## 2020-11-21 DIAGNOSIS — G4733 Obstructive sleep apnea (adult) (pediatric): Secondary | ICD-10-CM | POA: Diagnosis not present

## 2020-11-21 DIAGNOSIS — A4151 Sepsis due to Escherichia coli [E. coli]: Secondary | ICD-10-CM | POA: Diagnosis not present

## 2020-11-21 DIAGNOSIS — I2510 Atherosclerotic heart disease of native coronary artery without angina pectoris: Secondary | ICD-10-CM | POA: Diagnosis not present

## 2020-11-21 DIAGNOSIS — E785 Hyperlipidemia, unspecified: Secondary | ICD-10-CM | POA: Diagnosis not present

## 2020-11-21 DIAGNOSIS — I10 Essential (primary) hypertension: Secondary | ICD-10-CM | POA: Diagnosis not present

## 2020-11-21 DIAGNOSIS — J449 Chronic obstructive pulmonary disease, unspecified: Secondary | ICD-10-CM | POA: Diagnosis not present

## 2020-11-21 DIAGNOSIS — I252 Old myocardial infarction: Secondary | ICD-10-CM | POA: Diagnosis not present

## 2020-11-21 DIAGNOSIS — H3530 Unspecified macular degeneration: Secondary | ICD-10-CM | POA: Diagnosis not present

## 2020-11-21 DIAGNOSIS — H9193 Unspecified hearing loss, bilateral: Secondary | ICD-10-CM | POA: Diagnosis not present

## 2020-11-21 DIAGNOSIS — N3000 Acute cystitis without hematuria: Secondary | ICD-10-CM | POA: Diagnosis not present

## 2020-11-21 DIAGNOSIS — M419 Scoliosis, unspecified: Secondary | ICD-10-CM | POA: Diagnosis not present

## 2020-11-21 DIAGNOSIS — I700 Atherosclerosis of aorta: Secondary | ICD-10-CM | POA: Diagnosis not present

## 2020-11-21 DIAGNOSIS — G20 Parkinson's disease: Secondary | ICD-10-CM | POA: Diagnosis not present

## 2020-11-21 DIAGNOSIS — K219 Gastro-esophageal reflux disease without esophagitis: Secondary | ICD-10-CM | POA: Diagnosis not present

## 2020-11-25 ENCOUNTER — Telehealth: Payer: Self-pay

## 2020-11-25 ENCOUNTER — Ambulatory Visit: Payer: Medicare HMO

## 2020-11-25 NOTE — Telephone Encounter (Signed)
This nurse attempted to call patient three times in regards to scheduled telephonic AWV. Called at 1110, 1115, 1125. Message left that we will call back to reschedule for another time.

## 2020-11-27 DIAGNOSIS — I700 Atherosclerosis of aorta: Secondary | ICD-10-CM | POA: Diagnosis not present

## 2020-11-27 DIAGNOSIS — A4151 Sepsis due to Escherichia coli [E. coli]: Secondary | ICD-10-CM | POA: Diagnosis not present

## 2020-11-27 DIAGNOSIS — E785 Hyperlipidemia, unspecified: Secondary | ICD-10-CM | POA: Diagnosis not present

## 2020-11-27 DIAGNOSIS — I10 Essential (primary) hypertension: Secondary | ICD-10-CM | POA: Diagnosis not present

## 2020-11-27 DIAGNOSIS — N39498 Other specified urinary incontinence: Secondary | ICD-10-CM | POA: Diagnosis not present

## 2020-11-27 DIAGNOSIS — I252 Old myocardial infarction: Secondary | ICD-10-CM | POA: Diagnosis not present

## 2020-11-27 DIAGNOSIS — E876 Hypokalemia: Secondary | ICD-10-CM | POA: Diagnosis not present

## 2020-11-27 DIAGNOSIS — J449 Chronic obstructive pulmonary disease, unspecified: Secondary | ICD-10-CM | POA: Diagnosis not present

## 2020-11-27 DIAGNOSIS — G9341 Metabolic encephalopathy: Secondary | ICD-10-CM | POA: Diagnosis not present

## 2020-11-27 DIAGNOSIS — N320 Bladder-neck obstruction: Secondary | ICD-10-CM | POA: Diagnosis not present

## 2020-11-27 DIAGNOSIS — K219 Gastro-esophageal reflux disease without esophagitis: Secondary | ICD-10-CM | POA: Diagnosis not present

## 2020-11-27 DIAGNOSIS — H3530 Unspecified macular degeneration: Secondary | ICD-10-CM | POA: Diagnosis not present

## 2020-11-27 DIAGNOSIS — H9193 Unspecified hearing loss, bilateral: Secondary | ICD-10-CM | POA: Diagnosis not present

## 2020-11-27 DIAGNOSIS — Z7982 Long term (current) use of aspirin: Secondary | ICD-10-CM | POA: Diagnosis not present

## 2020-11-27 DIAGNOSIS — I2510 Atherosclerotic heart disease of native coronary artery without angina pectoris: Secondary | ICD-10-CM | POA: Diagnosis not present

## 2020-11-27 DIAGNOSIS — R350 Frequency of micturition: Secondary | ICD-10-CM | POA: Diagnosis not present

## 2020-11-27 DIAGNOSIS — M1990 Unspecified osteoarthritis, unspecified site: Secondary | ICD-10-CM | POA: Diagnosis not present

## 2020-11-27 DIAGNOSIS — N401 Enlarged prostate with lower urinary tract symptoms: Secondary | ICD-10-CM | POA: Diagnosis not present

## 2020-11-27 DIAGNOSIS — G4733 Obstructive sleep apnea (adult) (pediatric): Secondary | ICD-10-CM | POA: Diagnosis not present

## 2020-11-27 DIAGNOSIS — G20 Parkinson's disease: Secondary | ICD-10-CM | POA: Diagnosis not present

## 2020-11-27 DIAGNOSIS — G629 Polyneuropathy, unspecified: Secondary | ICD-10-CM | POA: Diagnosis not present

## 2020-11-27 DIAGNOSIS — M419 Scoliosis, unspecified: Secondary | ICD-10-CM | POA: Diagnosis not present

## 2020-11-27 DIAGNOSIS — N3000 Acute cystitis without hematuria: Secondary | ICD-10-CM | POA: Diagnosis not present

## 2020-12-10 ENCOUNTER — Encounter
Admission: EM | Disposition: A | Discharge status: Skilled Nursing Facility | Payer: Self-pay | Source: Home / Self Care | Attending: Internal Medicine

## 2020-12-10 ENCOUNTER — Inpatient Hospital Stay: Payer: Medicare HMO | Admitting: Anesthesiology

## 2020-12-10 ENCOUNTER — Encounter: Payer: Self-pay | Admitting: Internal Medicine

## 2020-12-10 ENCOUNTER — Other Ambulatory Visit: Payer: Self-pay

## 2020-12-10 ENCOUNTER — Emergency Department: Payer: Medicare HMO

## 2020-12-10 ENCOUNTER — Inpatient Hospital Stay: Payer: Medicare HMO

## 2020-12-10 ENCOUNTER — Inpatient Hospital Stay
Admission: EM | Admit: 2020-12-10 | Discharge: 2020-12-18 | DRG: 522 | Disposition: A | Discharge status: Skilled Nursing Facility | Payer: Medicare HMO | Attending: Hospitalist | Admitting: Hospitalist

## 2020-12-10 DIAGNOSIS — Z87442 Personal history of urinary calculi: Secondary | ICD-10-CM | POA: Diagnosis not present

## 2020-12-10 DIAGNOSIS — R0902 Hypoxemia: Secondary | ICD-10-CM | POA: Diagnosis not present

## 2020-12-10 DIAGNOSIS — N1832 Chronic kidney disease, stage 3b: Secondary | ICD-10-CM | POA: Diagnosis present

## 2020-12-10 DIAGNOSIS — G4733 Obstructive sleep apnea (adult) (pediatric): Secondary | ICD-10-CM | POA: Diagnosis present

## 2020-12-10 DIAGNOSIS — N323 Diverticulum of bladder: Secondary | ICD-10-CM | POA: Diagnosis not present

## 2020-12-10 DIAGNOSIS — Z9079 Acquired absence of other genital organ(s): Secondary | ICD-10-CM

## 2020-12-10 DIAGNOSIS — Z79899 Other long term (current) drug therapy: Secondary | ICD-10-CM

## 2020-12-10 DIAGNOSIS — I878 Other specified disorders of veins: Secondary | ICD-10-CM | POA: Diagnosis not present

## 2020-12-10 DIAGNOSIS — J449 Chronic obstructive pulmonary disease, unspecified: Secondary | ICD-10-CM | POA: Diagnosis not present

## 2020-12-10 DIAGNOSIS — F02818 Dementia in other diseases classified elsewhere, unspecified severity, with other behavioral disturbance: Secondary | ICD-10-CM | POA: Diagnosis present

## 2020-12-10 DIAGNOSIS — Z8711 Personal history of peptic ulcer disease: Secondary | ICD-10-CM

## 2020-12-10 DIAGNOSIS — Z96649 Presence of unspecified artificial hip joint: Secondary | ICD-10-CM

## 2020-12-10 DIAGNOSIS — I252 Old myocardial infarction: Secondary | ICD-10-CM | POA: Diagnosis not present

## 2020-12-10 DIAGNOSIS — N399 Disorder of urinary system, unspecified: Secondary | ICD-10-CM | POA: Diagnosis not present

## 2020-12-10 DIAGNOSIS — R69 Illness, unspecified: Secondary | ICD-10-CM | POA: Diagnosis not present

## 2020-12-10 DIAGNOSIS — Y92009 Unspecified place in unspecified non-institutional (private) residence as the place of occurrence of the external cause: Secondary | ICD-10-CM | POA: Diagnosis not present

## 2020-12-10 DIAGNOSIS — Z803 Family history of malignant neoplasm of breast: Secondary | ICD-10-CM | POA: Diagnosis not present

## 2020-12-10 DIAGNOSIS — I10 Essential (primary) hypertension: Secondary | ICD-10-CM | POA: Diagnosis present

## 2020-12-10 DIAGNOSIS — Z885 Allergy status to narcotic agent status: Secondary | ICD-10-CM

## 2020-12-10 DIAGNOSIS — W19XXXA Unspecified fall, initial encounter: Secondary | ICD-10-CM

## 2020-12-10 DIAGNOSIS — I129 Hypertensive chronic kidney disease with stage 1 through stage 4 chronic kidney disease, or unspecified chronic kidney disease: Secondary | ICD-10-CM | POA: Diagnosis not present

## 2020-12-10 DIAGNOSIS — E785 Hyperlipidemia, unspecified: Secondary | ICD-10-CM | POA: Diagnosis not present

## 2020-12-10 DIAGNOSIS — Z87440 Personal history of urinary (tract) infections: Secondary | ICD-10-CM | POA: Diagnosis not present

## 2020-12-10 DIAGNOSIS — I639 Cerebral infarction, unspecified: Secondary | ICD-10-CM | POA: Diagnosis not present

## 2020-12-10 DIAGNOSIS — W010XXA Fall on same level from slipping, tripping and stumbling without subsequent striking against object, initial encounter: Secondary | ICD-10-CM | POA: Diagnosis present

## 2020-12-10 DIAGNOSIS — E440 Moderate protein-calorie malnutrition: Secondary | ICD-10-CM | POA: Diagnosis present

## 2020-12-10 DIAGNOSIS — Z01811 Encounter for preprocedural respiratory examination: Secondary | ICD-10-CM

## 2020-12-10 DIAGNOSIS — I6521 Occlusion and stenosis of right carotid artery: Secondary | ICD-10-CM | POA: Diagnosis not present

## 2020-12-10 DIAGNOSIS — Z8249 Family history of ischemic heart disease and other diseases of the circulatory system: Secondary | ICD-10-CM

## 2020-12-10 DIAGNOSIS — M25552 Pain in left hip: Secondary | ICD-10-CM | POA: Diagnosis not present

## 2020-12-10 DIAGNOSIS — Z91041 Radiographic dye allergy status: Secondary | ICD-10-CM

## 2020-12-10 DIAGNOSIS — M1611 Unilateral primary osteoarthritis, right hip: Secondary | ICD-10-CM | POA: Diagnosis not present

## 2020-12-10 DIAGNOSIS — G20 Parkinson's disease: Secondary | ICD-10-CM | POA: Diagnosis present

## 2020-12-10 DIAGNOSIS — Z743 Need for continuous supervision: Secondary | ICD-10-CM | POA: Diagnosis not present

## 2020-12-10 DIAGNOSIS — Z741 Need for assistance with personal care: Secondary | ICD-10-CM | POA: Diagnosis not present

## 2020-12-10 DIAGNOSIS — K219 Gastro-esophageal reflux disease without esophagitis: Secondary | ICD-10-CM | POA: Diagnosis not present

## 2020-12-10 DIAGNOSIS — S72032A Displaced midcervical fracture of left femur, initial encounter for closed fracture: Secondary | ICD-10-CM | POA: Diagnosis not present

## 2020-12-10 DIAGNOSIS — Z7982 Long term (current) use of aspirin: Secondary | ICD-10-CM

## 2020-12-10 DIAGNOSIS — G319 Degenerative disease of nervous system, unspecified: Secondary | ICD-10-CM | POA: Diagnosis not present

## 2020-12-10 DIAGNOSIS — G629 Polyneuropathy, unspecified: Secondary | ICD-10-CM | POA: Diagnosis present

## 2020-12-10 DIAGNOSIS — M62552 Muscle wasting and atrophy, not elsewhere classified, left thigh: Secondary | ICD-10-CM | POA: Diagnosis not present

## 2020-12-10 DIAGNOSIS — M80052A Age-related osteoporosis with current pathological fracture, left femur, initial encounter for fracture: Secondary | ICD-10-CM | POA: Diagnosis not present

## 2020-12-10 DIAGNOSIS — Z20822 Contact with and (suspected) exposure to covid-19: Secondary | ICD-10-CM | POA: Diagnosis not present

## 2020-12-10 DIAGNOSIS — I25810 Atherosclerosis of coronary artery bypass graft(s) without angina pectoris: Secondary | ICD-10-CM | POA: Diagnosis present

## 2020-12-10 DIAGNOSIS — M4803 Spinal stenosis, cervicothoracic region: Secondary | ICD-10-CM | POA: Diagnosis not present

## 2020-12-10 DIAGNOSIS — Z87891 Personal history of nicotine dependence: Secondary | ICD-10-CM

## 2020-12-10 DIAGNOSIS — R296 Repeated falls: Secondary | ICD-10-CM | POA: Diagnosis present

## 2020-12-10 DIAGNOSIS — S72012A Unspecified intracapsular fracture of left femur, initial encounter for closed fracture: Secondary | ICD-10-CM | POA: Diagnosis not present

## 2020-12-10 DIAGNOSIS — R6510 Systemic inflammatory response syndrome (SIRS) of non-infectious origin without acute organ dysfunction: Secondary | ICD-10-CM | POA: Diagnosis not present

## 2020-12-10 DIAGNOSIS — S72002A Fracture of unspecified part of neck of left femur, initial encounter for closed fracture: Secondary | ICD-10-CM | POA: Diagnosis present

## 2020-12-10 DIAGNOSIS — R404 Transient alteration of awareness: Secondary | ICD-10-CM | POA: Diagnosis not present

## 2020-12-10 DIAGNOSIS — S199XXA Unspecified injury of neck, initial encounter: Secondary | ICD-10-CM | POA: Diagnosis not present

## 2020-12-10 DIAGNOSIS — E049 Nontoxic goiter, unspecified: Secondary | ICD-10-CM | POA: Diagnosis not present

## 2020-12-10 DIAGNOSIS — F05 Delirium due to known physiological condition: Secondary | ICD-10-CM | POA: Diagnosis not present

## 2020-12-10 DIAGNOSIS — S72002D Fracture of unspecified part of neck of left femur, subsequent encounter for closed fracture with routine healing: Secondary | ICD-10-CM | POA: Diagnosis not present

## 2020-12-10 DIAGNOSIS — Z9181 History of falling: Secondary | ICD-10-CM

## 2020-12-10 DIAGNOSIS — M62551 Muscle wasting and atrophy, not elsewhere classified, right thigh: Secondary | ICD-10-CM | POA: Diagnosis not present

## 2020-12-10 DIAGNOSIS — Z7401 Bed confinement status: Secondary | ICD-10-CM | POA: Diagnosis not present

## 2020-12-10 DIAGNOSIS — Z4789 Encounter for other orthopedic aftercare: Secondary | ICD-10-CM | POA: Diagnosis not present

## 2020-12-10 DIAGNOSIS — N183 Chronic kidney disease, stage 3 unspecified: Secondary | ICD-10-CM | POA: Diagnosis present

## 2020-12-10 DIAGNOSIS — F0280 Dementia in other diseases classified elsewhere without behavioral disturbance: Secondary | ICD-10-CM | POA: Diagnosis not present

## 2020-12-10 DIAGNOSIS — Z6825 Body mass index (BMI) 25.0-25.9, adult: Secondary | ICD-10-CM | POA: Diagnosis not present

## 2020-12-10 DIAGNOSIS — Z8349 Family history of other endocrine, nutritional and metabolic diseases: Secondary | ICD-10-CM

## 2020-12-10 DIAGNOSIS — R338 Other retention of urine: Secondary | ICD-10-CM | POA: Diagnosis not present

## 2020-12-10 DIAGNOSIS — M6259 Muscle wasting and atrophy, not elsewhere classified, multiple sites: Secondary | ICD-10-CM | POA: Diagnosis not present

## 2020-12-10 DIAGNOSIS — N1831 Chronic kidney disease, stage 3a: Secondary | ICD-10-CM | POA: Diagnosis present

## 2020-12-10 DIAGNOSIS — N401 Enlarged prostate with lower urinary tract symptoms: Secondary | ICD-10-CM | POA: Diagnosis present

## 2020-12-10 DIAGNOSIS — E569 Vitamin deficiency, unspecified: Secondary | ICD-10-CM | POA: Diagnosis not present

## 2020-12-10 DIAGNOSIS — S0990XA Unspecified injury of head, initial encounter: Secondary | ICD-10-CM | POA: Diagnosis not present

## 2020-12-10 HISTORY — PX: HIP ARTHROPLASTY: SHX981

## 2020-12-10 LAB — BASIC METABOLIC PANEL
Anion gap: 8 (ref 5–15)
BUN: 25 mg/dL — ABNORMAL HIGH (ref 8–23)
CO2: 27 mmol/L (ref 22–32)
Calcium: 9.1 mg/dL (ref 8.9–10.3)
Chloride: 107 mmol/L (ref 98–111)
Creatinine, Ser: 1.58 mg/dL — ABNORMAL HIGH (ref 0.61–1.24)
GFR, Estimated: 41 mL/min — ABNORMAL LOW (ref >60)
Glucose, Bld: 99 mg/dL (ref 70–99)
Potassium: 4 mmol/L (ref 3.5–5.1)
Sodium: 142 mmol/L (ref 135–145)

## 2020-12-10 LAB — CBC WITH DIFFERENTIAL/PLATELET
Abs Immature Granulocytes: 0.01 10*3/uL (ref 0.00–0.07)
Basophils Absolute: 0.1 10*3/uL (ref 0.0–0.1)
Basophils Relative: 1 %
Eosinophils Absolute: 0.2 10*3/uL (ref 0.0–0.5)
Eosinophils Relative: 4 %
HCT: 39.7 % (ref 39.0–52.0)
Hemoglobin: 12.8 g/dL — ABNORMAL LOW (ref 13.0–17.0)
Immature Granulocytes: 0 %
Lymphocytes Relative: 33 %
Lymphs Abs: 2.2 10*3/uL (ref 0.7–4.0)
MCH: 28.7 pg (ref 26.0–34.0)
MCHC: 32.2 g/dL (ref 30.0–36.0)
MCV: 89 fL (ref 80.0–100.0)
Monocytes Absolute: 0.4 10*3/uL (ref 0.1–1.0)
Monocytes Relative: 7 %
Neutro Abs: 3.7 10*3/uL (ref 1.7–7.7)
Neutrophils Relative %: 55 %
Platelets: 231 10*3/uL (ref 150–400)
RBC: 4.46 MIL/uL (ref 4.22–5.81)
RDW: 15.4 % (ref 11.5–15.5)
WBC: 6.7 10*3/uL (ref 4.0–10.5)
nRBC: 0 % (ref 0.0–0.2)

## 2020-12-10 LAB — PROTIME-INR
INR: 1.1 (ref 0.8–1.2)
Prothrombin Time: 13.7 seconds (ref 11.4–15.2)

## 2020-12-10 LAB — APTT: aPTT: 30 seconds (ref 24–36)

## 2020-12-10 LAB — TYPE AND SCREEN
ABO/RH(D): O POS
Antibody Screen: NEGATIVE

## 2020-12-10 LAB — RESP PANEL BY RT-PCR (FLU A&B, COVID) ARPGX2
Influenza A by PCR: NEGATIVE
Influenza B by PCR: NEGATIVE
SARS Coronavirus 2 by RT PCR: NEGATIVE

## 2020-12-10 SURGERY — HEMIARTHROPLASTY, HIP, DIRECT ANTERIOR APPROACH, FOR FRACTURE
Anesthesia: Spinal | Site: Hip | Laterality: Left

## 2020-12-10 MED ORDER — PHENYLEPHRINE HCL (PRESSORS) 10 MG/ML IV SOLN
INTRAVENOUS | Status: DC | PRN
  Administered 2020-12-10: 100 ug via INTRAVENOUS

## 2020-12-10 MED ORDER — BUPIVACAINE HCL (PF) 0.5 % IJ SOLN
INTRAMUSCULAR | Status: DC | PRN
  Administered 2020-12-10: 2.5 mL

## 2020-12-10 MED ORDER — BISACODYL 10 MG RE SUPP: 10.0000 mg | Freq: Every day | RECTAL | Status: DC | PRN

## 2020-12-10 MED ORDER — METHOCARBAMOL 1000 MG/10ML IJ SOLN
500.0000 mg | Freq: Four times a day (QID) | INTRAVENOUS | Status: DC | PRN
  Filled 2020-12-10: qty 5

## 2020-12-10 MED ORDER — FENTANYL CITRATE (PF) 100 MCG/2ML IJ SOLN
INTRAMUSCULAR | Status: AC
  Filled 2020-12-10: qty 2

## 2020-12-10 MED ORDER — METOCLOPRAMIDE HCL 5 MG/ML IJ SOLN: 5.0000 mg | Freq: Three times a day (TID) | INTRAMUSCULAR | Status: DC | PRN

## 2020-12-10 MED ORDER — CHLORHEXIDINE GLUCONATE CLOTH 2 % EX PADS
6.0000 | MEDICATED_PAD | Freq: Every day | CUTANEOUS | Status: DC
  Administered 2020-12-11 – 2020-12-17 (×7): 6 via TOPICAL

## 2020-12-10 MED ORDER — ACETAMINOPHEN 500 MG PO TABS
500.0000 mg | ORAL_TABLET | Freq: Four times a day (QID) | ORAL | Status: AC
Start: 2020-12-10 — End: 2020-12-11
  Administered 2020-12-10 – 2020-12-11 (×3): 500 mg via ORAL
  Filled 2020-12-10 (×4): qty 1

## 2020-12-10 MED ORDER — MELATONIN 5 MG PO TABS
2.5000 mg | ORAL_TABLET | Freq: Every evening | ORAL | Status: DC | PRN
  Administered 2020-12-12 – 2020-12-17 (×5): 2.5 mg via ORAL
  Filled 2020-12-10 (×5): qty 1

## 2020-12-10 MED ORDER — BISACODYL 10 MG RE SUPP
10.0000 mg | Freq: Every day | RECTAL | Status: DC | PRN
  Administered 2020-12-12: 10 mg via RECTAL
  Filled 2020-12-10: qty 1

## 2020-12-10 MED ORDER — BUPIVACAINE LIPOSOME 1.3 % IJ SUSP
INTRAMUSCULAR | Status: DC | PRN
  Administered 2020-12-10: 20 mL

## 2020-12-10 MED ORDER — ENOXAPARIN SODIUM 40 MG/0.4ML IJ SOSY
40.0000 mg | PREFILLED_SYRINGE | INTRAMUSCULAR | Status: DC
  Administered 2020-12-11 – 2020-12-18 (×8): 40 mg via SUBCUTANEOUS
  Filled 2020-12-10 (×8): qty 0.4

## 2020-12-10 MED ORDER — METOCLOPRAMIDE HCL 10 MG PO TABS: 5.0000 mg | ORAL_TABLET | Freq: Three times a day (TID) | ORAL | Status: DC | PRN

## 2020-12-10 MED ORDER — ONDANSETRON HCL 4 MG/2ML IJ SOLN
INTRAMUSCULAR | Status: AC
  Filled 2020-12-10: qty 2

## 2020-12-10 MED ORDER — CEFAZOLIN SODIUM-DEXTROSE 2-4 GM/100ML-% IV SOLN
2.0000 g | INTRAVENOUS | Status: AC
  Administered 2020-12-10: 2 g via INTRAVENOUS

## 2020-12-10 MED ORDER — SODIUM CHLORIDE 0.9 % IV SOLN: INTRAVENOUS | Status: DC

## 2020-12-10 MED ORDER — BUPIVACAINE-EPINEPHRINE (PF) 0.5% -1:200000 IJ SOLN
INTRAMUSCULAR | Status: AC
  Filled 2020-12-10: qty 30

## 2020-12-10 MED ORDER — GLYCOPYRROLATE 0.2 MG/ML IJ SOLN
INTRAMUSCULAR | Status: DC | PRN
  Administered 2020-12-10: .2 mg via INTRAVENOUS

## 2020-12-10 MED ORDER — ACETAMINOPHEN 10 MG/ML IV SOLN
INTRAVENOUS | Status: AC
  Filled 2020-12-10: qty 100

## 2020-12-10 MED ORDER — PRESERVISION AREDS 2 PO CAPS: ORAL_CAPSULE | Freq: Every day | ORAL | Status: DC

## 2020-12-10 MED ORDER — CEFAZOLIN SODIUM-DEXTROSE 2-4 GM/100ML-% IV SOLN
2.0000 g | Freq: Four times a day (QID) | INTRAVENOUS | Status: AC
  Administered 2020-12-11 (×2): 2 g via INTRAVENOUS
  Filled 2020-12-10 (×3): qty 100

## 2020-12-10 MED ORDER — DIPHENHYDRAMINE HCL 12.5 MG/5ML PO ELIX: 12.5000 mg | ORAL_SOLUTION | ORAL | Status: DC | PRN

## 2020-12-10 MED ORDER — PROPOFOL 10 MG/ML IV BOLUS
INTRAVENOUS | Status: DC | PRN
  Administered 2020-12-10 (×3): 20 mg via INTRAVENOUS

## 2020-12-10 MED ORDER — TRANEXAMIC ACID 1000 MG/10ML IV SOLN
INTRAVENOUS | Status: AC
  Filled 2020-12-10: qty 10

## 2020-12-10 MED ORDER — GABAPENTIN 300 MG PO CAPS
300.0000 mg | ORAL_CAPSULE | Freq: Four times a day (QID) | ORAL | Status: DC
  Administered 2020-12-10 – 2020-12-18 (×30): 300 mg via ORAL
  Filled 2020-12-10 (×30): qty 1

## 2020-12-10 MED ORDER — ONDANSETRON HCL 4 MG/2ML IJ SOLN: 4.0000 mg | Freq: Four times a day (QID) | INTRAMUSCULAR | Status: DC | PRN

## 2020-12-10 MED ORDER — MORPHINE SULFATE (PF) 2 MG/ML IV SOLN: 0.5000 mg | INTRAVENOUS | Status: DC | PRN

## 2020-12-10 MED ORDER — LACTATED RINGERS IV SOLN: INTRAVENOUS | Status: DC | PRN

## 2020-12-10 MED ORDER — PROPOFOL 500 MG/50ML IV EMUL
INTRAVENOUS | Status: DC | PRN
  Administered 2020-12-10: 40 ug/kg/min via INTRAVENOUS

## 2020-12-10 MED ORDER — ACETAMINOPHEN 500 MG PO TABS
500.0000 mg | ORAL_TABLET | Freq: Four times a day (QID) | ORAL | Status: DC | PRN
  Administered 2020-12-10: 500 mg via ORAL

## 2020-12-10 MED ORDER — ONDANSETRON HCL 4 MG PO TABS: 4.0000 mg | ORAL_TABLET | Freq: Four times a day (QID) | ORAL | Status: DC | PRN

## 2020-12-10 MED ORDER — ONDANSETRON HCL 4 MG/2ML IJ SOLN
INTRAMUSCULAR | Status: DC | PRN
  Administered 2020-12-10: 4 mg via INTRAVENOUS

## 2020-12-10 MED ORDER — BUPIVACAINE-EPINEPHRINE (PF) 0.5% -1:200000 IJ SOLN
INTRAMUSCULAR | Status: DC | PRN
  Administered 2020-12-10: 30 mL via PERINEURAL

## 2020-12-10 MED ORDER — FENTANYL CITRATE (PF) 100 MCG/2ML IJ SOLN: 25.0000 ug | INTRAMUSCULAR | Status: DC | PRN

## 2020-12-10 MED ORDER — DONEPEZIL HCL 5 MG PO TABS
10.0000 mg | ORAL_TABLET | Freq: Every day | ORAL | Status: DC
  Administered 2020-12-10 – 2020-12-17 (×8): 10 mg via ORAL
  Filled 2020-12-10 (×8): qty 2

## 2020-12-10 MED ORDER — BUPIVACAINE LIPOSOME 1.3 % IJ SUSP
INTRAMUSCULAR | Status: AC
  Filled 2020-12-10: qty 20

## 2020-12-10 MED ORDER — PHENYLEPHRINE HCL-NACL 20-0.9 MG/250ML-% IV SOLN
INTRAVENOUS | Status: DC | PRN
  Administered 2020-12-10: 50 ug/min via INTRAVENOUS

## 2020-12-10 MED ORDER — FLEET ENEMA 7-19 GM/118ML RE ENEM: 1.0000 | ENEMA | Freq: Once | RECTAL | Status: DC | PRN

## 2020-12-10 MED ORDER — ONDANSETRON HCL 4 MG/2ML IJ SOLN: 4.0000 mg | Freq: Once | INTRAMUSCULAR | Status: DC | PRN

## 2020-12-10 MED ORDER — PROPOFOL 1000 MG/100ML IV EMUL
INTRAVENOUS | Status: AC
  Filled 2020-12-10: qty 100

## 2020-12-10 MED ORDER — TAMSULOSIN HCL 0.4 MG PO CAPS
0.4000 mg | ORAL_CAPSULE | Freq: Every evening | ORAL | Status: DC
  Administered 2020-12-11 – 2020-12-17 (×7): 0.4 mg via ORAL
  Filled 2020-12-10 (×7): qty 1

## 2020-12-10 MED ORDER — ACETAMINOPHEN 325 MG PO TABS
325.0000 mg | ORAL_TABLET | Freq: Four times a day (QID) | ORAL | Status: DC | PRN
  Administered 2020-12-11 – 2020-12-12 (×2): 650 mg via ORAL
  Filled 2020-12-10 (×2): qty 2

## 2020-12-10 MED ORDER — PANTOPRAZOLE SODIUM 40 MG PO TBEC
40.0000 mg | DELAYED_RELEASE_TABLET | Freq: Every day | ORAL | Status: DC
  Administered 2020-12-11 – 2020-12-18 (×8): 40 mg via ORAL
  Filled 2020-12-10 (×8): qty 1

## 2020-12-10 MED ORDER — DOCUSATE SODIUM 100 MG PO CAPS
100.0000 mg | ORAL_CAPSULE | Freq: Two times a day (BID) | ORAL | Status: DC
  Administered 2020-12-10 – 2020-12-18 (×14): 100 mg via ORAL
  Filled 2020-12-10 (×15): qty 1

## 2020-12-10 MED ORDER — ACETAMINOPHEN 500 MG PO TABS
ORAL_TABLET | ORAL | Status: AC
  Administered 2020-12-11: 500 mg via ORAL
  Filled 2020-12-10: qty 1

## 2020-12-10 MED ORDER — METHOCARBAMOL 500 MG PO TABS
500.0000 mg | ORAL_TABLET | Freq: Four times a day (QID) | ORAL | Status: DC | PRN
  Administered 2020-12-12 – 2020-12-16 (×4): 500 mg via ORAL
  Filled 2020-12-10 (×4): qty 1

## 2020-12-10 MED ORDER — FLEET ENEMA 7-19 GM/118ML RE ENEM
1.0000 | ENEMA | Freq: Once | RECTAL | Status: DC | PRN
Start: 2020-12-10 — End: 2020-12-11

## 2020-12-10 MED ORDER — MAGNESIUM HYDROXIDE 400 MG/5ML PO SUSP: 30.0000 mL | Freq: Every day | ORAL | Status: DC | PRN

## 2020-12-10 MED ORDER — HYDROCODONE-ACETAMINOPHEN 5-325 MG PO TABS
1.0000 | ORAL_TABLET | Freq: Four times a day (QID) | ORAL | Status: DC | PRN
  Administered 2020-12-13: 2 via ORAL
  Filled 2020-12-10: qty 2

## 2020-12-10 MED ORDER — TRANEXAMIC ACID 1000 MG/10ML IV SOLN
INTRAVENOUS | Status: DC | PRN
  Administered 2020-12-10: 1000 mg via INTRAVENOUS

## 2020-12-10 MED ORDER — SODIUM CHLORIDE FLUSH 0.9 % IV SOLN
INTRAVENOUS | Status: AC
  Filled 2020-12-10: qty 40

## 2020-12-10 MED ORDER — SODIUM CHLORIDE 0.9 % IR SOLN
Status: DC | PRN
  Administered 2020-12-10: 3000 mL

## 2020-12-10 MED ORDER — DEXAMETHASONE SODIUM PHOSPHATE 10 MG/ML IJ SOLN
INTRAMUSCULAR | Status: DC | PRN
  Administered 2020-12-10: 10 mg via INTRAVENOUS

## 2020-12-10 MED ORDER — LOSARTAN POTASSIUM 25 MG PO TABS
25.0000 mg | ORAL_TABLET | Freq: Every day | ORAL | Status: DC
  Administered 2020-12-11 – 2020-12-18 (×8): 25 mg via ORAL
  Filled 2020-12-10 (×8): qty 1

## 2020-12-10 MED ORDER — CEFAZOLIN SODIUM-DEXTROSE 2-4 GM/100ML-% IV SOLN
INTRAVENOUS | Status: AC
  Administered 2020-12-10: 2 g via INTRAVENOUS
  Filled 2020-12-10: qty 100

## 2020-12-10 SURGICAL SUPPLY — 68 items
APL PRP STRL LF DISP 70% ISPRP (MISCELLANEOUS) ×2
BAG DECANTER FOR FLEXI CONT (MISCELLANEOUS) ×1 IMPLANT
BLADE SAGITTAL WIDE XTHICK NO (BLADE) ×1 IMPLANT
BLADE SAW SAG 25.4X90 (BLADE) ×1 IMPLANT
BLADE SURG SZ20 CARB STEEL (BLADE) ×2 IMPLANT
BNDG COHESIVE 6X5 TAN ST LF (GAUZE/BANDAGES/DRESSINGS) ×2 IMPLANT
BOWL CEMENT MIXING ADV NOZZLE (MISCELLANEOUS) IMPLANT
CHLORAPREP W/TINT 26 (MISCELLANEOUS) ×4 IMPLANT
DECANTER SPIKE VIAL GLASS SM (MISCELLANEOUS) ×2 IMPLANT
DRAPE 3/4 80X56 (DRAPES) ×2 IMPLANT
DRAPE IMP U-DRAPE 54X76 (DRAPES) ×4 IMPLANT
DRAPE INCISE IOBAN 66X60 STRL (DRAPES) ×2 IMPLANT
DRAPE SURG 17X11 SM STRL (DRAPES) ×2 IMPLANT
DRAPE SURG 17X23 STRL (DRAPES) ×2 IMPLANT
DRSG OPSITE POSTOP 4X12 (GAUZE/BANDAGES/DRESSINGS) ×1 IMPLANT
DRSG OPSITE POSTOP 4X14 (GAUZE/BANDAGES/DRESSINGS) IMPLANT
DRSG OPSITE POSTOP 4X8 (GAUZE/BANDAGES/DRESSINGS) ×2 IMPLANT
ELECT CAUTERY BLADE 6.4 (BLADE) ×2 IMPLANT
ELECT REM PT RETURN 9FT ADLT (ELECTROSURGICAL) ×2
ELECTRODE REM PT RTRN 9FT ADLT (ELECTROSURGICAL) ×1 IMPLANT
GAUZE 4X4 16PLY ~~LOC~~+RFID DBL (SPONGE) ×2 IMPLANT
GAUZE PACK 2X3YD (PACKING) IMPLANT
GLOVE SRG 8 PF TXTR STRL LF DI (GLOVE) IMPLANT
GLOVE SURG ENC MOIS LTX SZ8 (GLOVE) ×6 IMPLANT
GLOVE SURG ENC TEXT LTX SZ7.5 (GLOVE) IMPLANT
GLOVE SURG UNDER LTX SZ8 (GLOVE) ×2 IMPLANT
GLOVE SURG UNDER POLY LF SZ8 (GLOVE)
GOWN STRL REUS W/ TWL LRG LVL3 (GOWN DISPOSABLE) ×1 IMPLANT
GOWN STRL REUS W/ TWL XL LVL3 (GOWN DISPOSABLE) ×1 IMPLANT
GOWN STRL REUS W/TWL LRG LVL3 (GOWN DISPOSABLE) ×2
GOWN STRL REUS W/TWL XL LVL3 (GOWN DISPOSABLE) ×2
HEAD MODULAR STD 28MM (Orthopedic Implant) ×1 IMPLANT
HOOD PEEL AWAY FLYTE STAYCOOL (MISCELLANEOUS) ×3 IMPLANT
IV NS 100ML SINGLE PACK (IV SOLUTION) IMPLANT
IV NS IRRIG 3000ML ARTHROMATIC (IV SOLUTION) ×3 IMPLANT
LABEL OR SOLS (LABEL) ×2 IMPLANT
MANIFOLD NEPTUNE II (INSTRUMENTS) ×2 IMPLANT
NDL FILTER BLUNT 18X1 1/2 (NEEDLE) ×1 IMPLANT
NDL SAFETY ECLIPSE 18X1.5 (NEEDLE) ×1 IMPLANT
NDL SPNL 20GX3.5 QUINCKE YW (NEEDLE) ×1 IMPLANT
NEEDLE FILTER BLUNT 18X 1/2SAF (NEEDLE) ×1
NEEDLE FILTER BLUNT 18X1 1/2 (NEEDLE) ×1 IMPLANT
NEEDLE HYPO 18GX1.5 SHARP (NEEDLE) ×2
NEEDLE SPNL 20GX3.5 QUINCKE YW (NEEDLE) ×4 IMPLANT
NS IRRIG 1000ML POUR BTL (IV SOLUTION) ×2 IMPLANT
PACK HIP PROSTHESIS (MISCELLANEOUS) ×2 IMPLANT
PULSAVAC PLUS IRRIG FAN TIP (DISPOSABLE) ×2
RINGBLOC BI POLAR 28X57MM (Orthopedic Implant) ×2 IMPLANT
SHELL RINGBLOC BI POLR 28X57MM (Orthopedic Implant) IMPLANT
SPONGE T-LAP 18X18 ~~LOC~~+RFID (SPONGE) ×8 IMPLANT
STAPLER SKIN PROX 35W (STAPLE) ×2 IMPLANT
STEM COLLARLESS RED 15X155X130 (Stem) ×1 IMPLANT
STRAP SAFETY 5IN WIDE (MISCELLANEOUS) ×2 IMPLANT
SUT ETHIBOND 2 V 37 (SUTURE) ×2 IMPLANT
SUT VIC AB 1 CT1 36 (SUTURE) IMPLANT
SUT VIC AB 2-0 CT1 (SUTURE) ×4 IMPLANT
SUT VIC AB 2-0 CT1 27 (SUTURE)
SUT VIC AB 2-0 CT1 TAPERPNT 27 (SUTURE) IMPLANT
SUT VICRYL 1-0 27IN ABS (SUTURE) ×4
SUTURE VICRYL 1-0 27IN ABS (SUTURE) ×2 IMPLANT
SYR 10ML LL (SYRINGE) ×2 IMPLANT
SYR 30ML LL (SYRINGE) ×6 IMPLANT
SYR TB 1ML 27GX1/2 LL (SYRINGE) IMPLANT
TAPE TRANSPORE STRL 2 31045 (GAUZE/BANDAGES/DRESSINGS) ×2 IMPLANT
TIP BRUSH PULSAVAC PLUS 24.33 (MISCELLANEOUS) IMPLANT
TIP FAN IRRIG PULSAVAC PLUS (DISPOSABLE) ×1 IMPLANT
TRAP FLUID SMOKE EVACUATOR (MISCELLANEOUS) ×2 IMPLANT
WATER STERILE IRR 500ML POUR (IV SOLUTION) ×2 IMPLANT

## 2020-12-10 NOTE — Consult Note (Addendum)
ORTHOPAEDIC CONSULTATION  REQUESTING PHYSICIAN: Collier Bullock, MD  Chief Complaint:   Left hip pain  History of Present Illness: Jason Cross. is a 87 y.o. male with multiple medical problems including coronary artery disease, peptic ulcer disease, hypertension, hyperlipidemia, status post MI, gastroesophageal reflux disease, sleep apnea, peripheral neuropathy, COPD, and macular degeneration who lives independently with his wife.  Apparently, the patient, who normally ambulates with a cane or walker, got up around 3 this morning to go to the bathroom.  He lost his balance and fell onto his left side.  He is unable to get up and so was brought to the emergency room where x-rays demonstrated a displaced left femoral neck fracture.  The patient denies any associated injuries.  He did not strike his head or lose consciousness.  The patient also denies any lightheadedness, dizziness, chest pain, shortness of breath, or other symptoms which may have precipitated his fall.  Past Medical History:  Diagnosis Date   Arthritis    Benign prostate hyperplasia    Cataracts, bilateral    Coarse tremors    L>R hands   COPD (chronic obstructive pulmonary disease) (HCC)    first stages   Coronary artery disease    Dizziness    occasional   Gastric ulcer    in the 60's   GERD (gastroesophageal reflux disease)    takes Omeprazole every other day   Hard of hearing    doesn't wear hearing aids   History of colon polyps    History of kidney stones    still one on the left side, low   Hyperlipidemia    takes Simvastatin daily   Hypertension    stopped BP meds, BP under control   Macular degeneration    Myocardial infarction ([place]) 2009   Peripheral neuropathy    takes Gabapentin daily   Pneumonia    hx of in the 60's   Sleep apnea    Past Surgical History:  Procedure Laterality Date   BACK SURGERY  2007   COLONOSCOPY      CORONARY ARTERY BYPASS GRAFT  2009   LIMA to LAD, SVG to D1 07/2007   EYE SURGERY Bilateral 2004, 2005   implants   GREEN LIGHT LASER TURP (TRANSURETHRAL RESECTION OF PROSTATE N/A 05/10/2013   Procedure: GREEN LIGHT LASER TURP (TRANSURETHRAL RESECTION OF PROSTATE;  Surgeon: Ardis Hughs, MD;  Location: [facility];  Service: Urology;  Laterality: N/A;   lipoma removed     chest wall   microthermo prostate  2009   [place] SURGERY  10/14   THORACIC DISCECTOMY N/A 03/10/2013   Procedure: Thoracic twelve-Lumbar one Laminectomy;  Surgeon: Erline Levine, MD;  Location: [facility];  Service: Neurosurgery;  Laterality: N/A;  Thoracic twelve-Lumbar one Laminectomy   TONSILLECTOMY     at age 41   Social History   Socioeconomic History   Marital status: Married    Spouse name: Not on file   Number of children: Not on file   Years of education: Not on file   Highest education level: 11th grade  Occupational History   Occupation: retired   Tobacco Use   Smoking status: Former    Years: 40.00    Types: Cigarettes    Quit date: 02/23/1993    Years since quitting: 27.8   Smokeless tobacco: Never  Vaping Use   Vaping Use: Never used  Substance and Sexual Activity   Alcohol use: No  Drug use: No   Sexual activity: Not on file  Other Topics Concern   Not on file  Social History Narrative   Not on file   Social Determinants of Health   Financial Resource Strain: Not on file  Food Insecurity: Not on file  Transportation Needs: Not on file  Physical Activity: Not on file  Stress: Not on file  Social Connections: Not on file   Family History  Problem Relation Age of Onset   Hip fracture Mother    Heart disease Father    Hypertension Father    Heart attack Father    Thyroid disease Father    Cancer Sister        breast   Breast cancer Sister    Allergies  Allergen Reactions   Contrast Media [Iodinated Diagnostic Agents] Hives, Itching and Rash   Codeine  Nausea And Vomiting   Prior to Admission medications   Medication Sig Start Date End Date Taking? Authorizing Provider  acetaminophen (TYLENOL) 500 MG tablet Take 500 mg by mouth every 6 (six) hours as needed.   Yes [provider]  aspirin EC 81 MG tablet Take 81 mg by mouth every morning.   Yes [provider]  donepezil (ARICEPT) 10 MG tablet Take 1 tablet (10 mg total) by mouth at bedtime. 06/26/19  Yes Cannady, [name] T, NP  gabapentin (NEURONTIN) 300 MG capsule Take 300 mg by mouth 4 (four) times daily. 06/12/19  Yes [provider]  losartan (COZAAR) 25 MG tablet Take 25 mg by mouth daily. 06/03/19  Yes [provider]  melatonin 1 MG TABS tablet Take 1 tablet (1 mg total) by mouth at bedtime as needed. 08/02/20  Yes Cannady, [name] T, NP  Multiple Vitamins-Minerals (PRESERVISION AREDS 2 PO) Take 2 tablets by mouth daily.   Yes [provider]  omeprazole (PRILOSEC) 40 MG capsule Take 40 mg by mouth daily. 08/28/19  Yes [provider]  tamsulosin (FLOMAX) 0.4 MG CAPS capsule Take 0.4 mg by mouth every evening. 11/06/20  Yes [provider]  silodosin (RAPAFLO) 8 MG CAPS capsule Take 1 capsule (8 mg total) by mouth daily with breakfast. Patient not taking: Reported on 12/10/2020 09/12/20   Debroah Loop, PA-C   DG Chest 1 View  Result Date: 12/10/2020 CLINICAL DATA:  Pt presents from home via EMS following a fall while trying to get to the restroom. Pt endorses left hip pain - notable shortening on exam. Pt states he hit his head - denies LOC. Hx of dementia EXAM: CHEST  1 VIEW COMPARISON:  08/28/2020 FINDINGS: Coarse interstitial markings, most prominent in the left lower lung. No confluent airspace disease or overt edema. Heart size and mediastinal contours are within normal limits. Aortic Atherosclerosis (ICD10-170.0). CABG marker. No effusion.  No pneumothorax. Sternotomy wires. Lumbar fixation hardware partially visualized.  Vertebral endplate spurring at multiple levels in the thoracolumbar spine. IMPRESSION: No acute cardiopulmonary disease. Electronically Signed   By: Lucrezia Europe M.D.   On: 12/10/2020 05:53   CT HEAD WO CONTRAST (5MM)  Result Date: 12/10/2020 CLINICAL DATA:  Blunt trauma post fall EXAM: CT HEAD WITHOUT CONTRAST TECHNIQUE: Contiguous axial images were obtained from the base of the skull through the vertex without intravenous contrast. COMPARISON:  MR 08/09/2020 1 a FINDINGS: Brain: Diffuse parenchymal atrophy. Patchy areas of hypoattenuation in deep and periventricular white matter bilaterally. Negative for acute intracranial hemorrhage, mass lesion, acute infarction, midline shift, or mass-effect. Acute infarct may be inapparent on  noncontrast CT. Ventricles and sulci symmetric. Vascular: Atherosclerotic and physiologic intracranial calcifications. Skull: Eccentric sclerotic slightly expansile lesion in the clivus centered to the left of midline as described on prior studies back to 12/13/2012, nonspecific but possibly chondroid lesion. Negative for fracture or acute lesion. Sinuses/Orbits: Retention cyst or polyp in the sphenoid sinus. No acute findings. Other: None IMPRESSION: 1. Negative for bleed or other acute intracranial process. 2. Atrophy and nonspecific white matter changes. 3. Sclerotic osseous lesion in the clivus as described on prior studies back to 2014. Electronically Signed   By: Lucrezia Europe M.D.   On: 12/10/2020 05:57   CT Cervical Spine Wo Contrast  Result Date: 12/10/2020 CLINICAL DATA:  [place], blunt trauma to head EXAM: CT CERVICAL SPINE WITHOUT CONTRAST TECHNIQUE: Multidetector CT imaging of the cervical spine was performed without intravenous contrast. Multiplanar CT image reconstructions were also generated. COMPARISON:  MR 12/12/2012 FINDINGS: Alignment: Normal Skull base and vertebrae: Slightly expansile mostly sclerotic lesion in the clivus centered to the left of midline, as seen on  studies dating back to 2014. No fracture. Soft tissues and spinal canal: Enlarged heterogenous thyroid, incompletely visualized, with rightward displacement and mild narrowing of the trachea at the thoracic inlet. In the setting of significant comorbidities or limited life expectancy, no follow-up recommended (ref: J Am Coll Radiol. 2015 Feb;12(2): 143-50). No prevertebral swelling or fluid. Right calcified carotid bifurcation plaque. No evident canal hematoma. Disc levels: Mild narrowing C5-6 and C6-7 with small anterior and posterior endplate spurs. Moderate narrowing C7-T1 with endplate spurring. Multilevel facet DJD. Upper chest: Visualized lung apices clear. Other: Suture material in the subcutaneous tissues and nuchal ligament of the posterior cervical spine in the midline C4-C7. IMPRESSION: 1. Negative for fracture or other acute bone abnormality. 2. Cervical spondylitic and postop changes as above 3. Sclerotic osseous lesion in the clivus, as described on prior studies back to 2014. Electronically Signed   By: Lucrezia Europe M.D.   On: 12/10/2020 06:05   CT PELVIS WO CONTRAST  Result Date: 12/10/2020 CLINICAL DATA:  87 year old male status post fall with left hip pain, left femoral neck fracture suspected on radiographs. EXAM: CT PELVIS WITHOUT CONTRAST TECHNIQUE: Multidetector CT imaging of the pelvis was performed following the standard protocol without intravenous contrast. COMPARISON:  Left hip series 0 455 hours today. CT Abdomen and Pelvis 08/28/2020. FINDINGS: Urinary Tract: Partially visible left renal nephrocalcinosis and exophytic cysts. Stable small right renal lower pole cyst. No hydroureter. Extensive bladder diverticula appear stable. Pelvic phleboliths. Bowel: New stool ball in the rectum, about 6 cm diameter with chronic retained stool in the sigmoid. But visible large and small bowel loops are nondilated. Normal gas-filled appendix on series 3, image 42. Vascular/Lymphatic: Aortoiliac  calcified atherosclerosis. Vascular patency is not evaluated in the absence of IV contrast. No lymphadenopathy identified. Reproductive:  Negative. Other:  No pelvic free fluid.  No pelvic sidewall hematoma. Musculoskeletal: Chronic lumbar degeneration, decompression and fusion. Visible lumbar hardware appears stable and intact. Sacrum and SI joints appear stable and intact. Pelvis and proximal right femur appears stable and intact. Transverse fracture subcapital left femoral neck is mildly angulated and impacted (series 3, image 95). Small comminution fragment on series 4, image 69. The intertrochanteric segment remains intact. The left femoral head is normally located. IMPRESSION: 1. Confirmed left femoral neck fracture, subcapital mildly angulated, impacted, and slightly comminuted. 2. No other acute traumatic injury identified in the pelvis. 3. Increased rectal stool, consider mild fecal impaction. Chronic bladder diverticula,  left nephrocalcinosis. 4. Aortic Atherosclerosis (ICD10-I70.0). Electronically Signed   By: Genevie Ann M.D.   On: 12/10/2020 07:17   DG Hip Unilat W or Wo Pelvis 2-3 Views Left  Result Date: 12/10/2020 CLINICAL DATA:  [place], left hip pain EXAM: DG HIP (WITH OR WITHOUT PELVIS) 2-3V LEFT COMPARISON:  07/18/2020 FINDINGS: Probable impacted left femoral neck fracture. No dislocation. Bony pelvis intact. Fixation hardware in the lower lumbar spine partially visualized. Degenerative disc disease L5-S1. Bilateral pelvic phleboliths. Patchy arterial calcifications. IMPRESSION: Probable impacted left femoral neck fracture, new since previous. Electronically Signed   By: Lucrezia Europe M.D.   On: 12/10/2020 05:52    Positive ROS: All other systems have been reviewed and were otherwise negative with the exception of those mentioned in the HPI and as above.  Physical Exam: General:  Alert, no acute distress Psychiatric:  Patient is not competent for consent, but exhibits normal mood and affect    Cardiovascular:  No pedal edema Respiratory:  No wheezing, non-labored breathing GI:  Abdomen is soft and non-tender Skin:  No lesions in the area of chief complaint Neurologic:  Sensation intact distally Lymphatic:  No axillary or cervical lymphadenopathy  Orthopedic Exam:  Orthopedic examination is limited to the left hip and lower extremity.  The left lower extremity is somewhat shortened and externally rotated as compared to the right.  Skin inspection around the left hip is notable for mild swelling, but otherwise is unremarkable.  No erythema, ecchymosis, abrasions, or other skin abnormalities are identified.  He has mild tenderness palpation over the lateral aspect of the left hip.  He has more severe pain with any attempted active or passive motion of the left hip.  He is neurovascularly intact to the left lower extremity and foot, demonstrating the ability to dorsiflex and plantarflex his toes and ankle.  Sensation is intact to light touch to all distributions.  He has good capillary refill to his left foot.  X-rays:  Recent x-rays of the pelvis and left hip are available for review and have been reviewed by myself.  These films demonstrate a displaced left femoral neck fracture.   Assessment: Displaced left femoral neck fracture.  Plan: The treatment options, including both surgical and nonsurgical choices, have been discussed in detail with the patient and his family who is at the bedside. The patient and his wife would like to proceed with surgical intervention to include a left hip hemiarthroplasty. The risks (including bleeding, infection, nerve and/or blood vessel injury, persistent or recurrent pain, loosening or failure of the components, leg length inequality, dislocation, need for further surgery, blood clots, strokes, heart attacks or arrhythmias, pneumonia, etc.) and benefits of the surgical procedure were discussed. The patient and his wife state their understanding and agree  to proceed. They agree to a blood transfusion if necessary. A formal written consent will be obtained by the nursing staff.  Thank you for asking me to participate in the care of this most pleasant unfortunate man.  I will be happy to follow him with you.   Pascal Lux, MD  Beeper #:  [phone]  12/10/2020 12:05 PM

## 2020-12-10 NOTE — H&P (Signed)
History and Physical    Jason Cross. LTJ:[id number] DOB: March 16, 1928 DOA: 12/10/2020  PCP: Venita Lick, NP   Patient coming from: Home  I have personally briefly reviewed patient's old medical records in [place]  Chief Complaint: Left hip pain  Most of the history was obtained from patient's wife at the bedside as he is unable to provide history due to his dementia.  HPI: Jason Cross. is a 87 y.o. male with medical history significant for dementia, Parkinson's disease, coronary artery disease status post CABG, peripheral neuropathy, GERD who presents to the ER via EMS for evaluation following a fall at home. At baseline patient ambulates with a rolling walker.  According to the wife he has frequent falls. His wife states that he got up to use the bathroom when he tripped and fell landing on his left side.  Patient states that he hit his head when he fell.  There was no loss of consciousness.  He was unable to get up or bear weight on the left lower extremity due to severe pain.  He was brought into the ER by EMS. I am unable to do a review of systems on this patient due to his underlying dementia Labs show sodium 142, potassium 4.0, chloride 107, bicarb 27, glucose 99, BUN 25, creatinine 1.58, calcium 9.1, white count 6.7, hemoglobin 12.8, hematocrit 39.7, MCV 89.0, RDW 15.4, platelet count 231, PT 13.7, INR 1.1 Respiratory viral panel is negative Left hip x-ray shows probable impacted left femoral neck fracture, new since previous. Chest x-ray reviewed by me shows no evidence of acute cardiopulmonary disease. CT scan of the head without contrast/cervical spine CT is negative for bleed or other acute intracranial process.  Atrophy and nonspecific white matter changes.  Sclerotic osseous lesion in the clivus as described on prior studies back to 2014. CT scan of the pelvis shows a left femoral neck fracture, subcapital mildly angulated, impacted and slightly  comminuted.  Increased rectal stool. Twelve-lead EKG reviewed by me shows sinus rhythm with occasional PVCs.  Left anterior fascicular block   ED Course: Patient is a 87 year old Caucasian male who was brought into the ER by EMS for evaluation after a fall at home with pain in the left hip.  Patient has a left femoral neck fracture and will be admitted to the hospital for further evaluation. Orthopedic surgery has been consulted   Review of Systems: As per HPI otherwise all other systems reviewed and negative.    Past Medical History:  Diagnosis Date   Arthritis    Benign prostate hyperplasia    Cataracts, bilateral    Coarse tremors    L>R hands   COPD (chronic obstructive pulmonary disease) (HCC)    first stages   Coronary artery disease    Dizziness    occasional   Gastric ulcer    in the 60's   GERD (gastroesophageal reflux disease)    takes Omeprazole every other day   Hard of hearing    doesn't wear hearing aids   History of colon polyps    History of kidney stones    still one on the left side, low   Hyperlipidemia    takes Simvastatin daily   Hypertension    stopped BP meds, BP under control   Macular degeneration    Myocardial infarction ([place]) 2009   Peripheral neuropathy    takes Gabapentin daily   Pneumonia    hx of in the 60's  Sleep apnea     Past Surgical History:  Procedure Laterality Date   BACK SURGERY  2007   COLONOSCOPY     CORONARY ARTERY BYPASS GRAFT  2009   LIMA to LAD, SVG to D1 07/2007   EYE SURGERY Bilateral 2004, 2005   implants   GREEN LIGHT LASER TURP (TRANSURETHRAL RESECTION OF PROSTATE N/A 05/10/2013   Procedure: GREEN LIGHT LASER TURP (TRANSURETHRAL RESECTION OF PROSTATE;  Surgeon: Ardis Hughs, MD;  Location: [facility];  Service: Urology;  Laterality: N/A;   lipoma removed     chest wall   microthermo prostate  2009   [place] SURGERY  10/14   THORACIC DISCECTOMY N/A 03/10/2013   Procedure: Thoracic  twelve-Lumbar one Laminectomy;  Surgeon: Erline Levine, MD;  Location: [facility];  Service: Neurosurgery;  Laterality: N/A;  Thoracic twelve-Lumbar one Laminectomy   TONSILLECTOMY     at age 58     reports that he quit smoking about 27 years ago. His smoking use included cigarettes. He has never used smokeless tobacco. He reports that he does not drink alcohol and does not use drugs.  Allergies  Allergen Reactions   Contrast Media [Iodinated Diagnostic Agents] Hives, Itching and Rash   Codeine Nausea And Vomiting    Family History  Problem Relation Age of Onset   Hip fracture Mother    Heart disease Father    Hypertension Father    Heart attack Father    Thyroid disease Father    Cancer Sister        breast   Breast cancer Sister       Prior to Admission medications   Medication Sig Start Date End Date Taking? Authorizing Provider  acetaminophen (TYLENOL) 500 MG tablet Take 500 mg by mouth every 6 (six) hours as needed.    [provider]  aspirin EC 81 MG tablet Take 81 mg by mouth every morning.    [provider]  donepezil (ARICEPT) 10 MG tablet Take 1 tablet (10 mg total) by mouth at bedtime. 06/26/19   Cannady, Henrine Screws T, NP  gabapentin (NEURONTIN) 300 MG capsule Take 300 mg by mouth 4 (four) times daily. 06/12/19   [provider]  losartan (COZAAR) 25 MG tablet Take 25 mg by mouth daily. 06/03/19   [provider]  melatonin 1 MG TABS tablet Take 1 tablet (1 mg total) by mouth at bedtime as needed. 08/02/20   Cannady, Henrine Screws T, NP  Multiple Vitamins-Minerals (PRESERVISION AREDS 2 PO) Take 2 tablets by mouth daily.    [provider]  omeprazole (PRILOSEC) 40 MG capsule Take 40 mg by mouth daily. 08/28/19   [provider]  silodosin (RAPAFLO) 8 MG CAPS capsule Take 1 capsule (8 mg total) by mouth daily with breakfast. 09/12/20   Vaillancourt, Aldona Bar, PA-C  tamsulosin (FLOMAX) 0.4 MG CAPS capsule Take 0.4 mg by mouth every  evening. 11/06/20   [provider]    Physical Exam: Vitals:   12/10/20 0445 12/10/20 0653  BP: (!) 184/94 (!) 162/92  Pulse: 70 75  Resp: 16 17  Temp: 97.9 F (36.6 C)   TempSrc: Oral   SpO2: 98% 98%  Weight: 77.1 kg   Height: 5\' 9"  (1.753 m)      Vitals:   12/10/20 0445 12/10/20 0653  BP: (!) 184/94 (!) 162/92  Pulse: 70 75  Resp: 16 17  Temp: 97.9 F (36.6 C)   TempSrc: Oral  SpO2: 98% 98%  Weight: 77.1 kg   Height: 5\' 9"  (1.753 m)       Constitutional: Alert and oriented to person and place . Not in any apparent distress HEENT:      Head: Normocephalic and atraumatic.         Eyes: PERLA, EOMI, Conjunctivae are normal. Sclera is non-icteric.       Mouth/Throat: Mucous membranes are moist.       Neck: Supple with no signs of meningismus. Cardiovascular: Regular rate and rhythm. No murmurs, gallops, or rubs. 2+ symmetrical distal pulses are present . No JVD. No LE edema Respiratory: Respiratory effort normal .Lungs sounds clear bilaterally. No wheezes, crackles, or rhonchi.  Gastrointestinal: Soft, non tender, and non distended with positive bowel sounds.  Genitourinary: No CVA tenderness. Musculoskeletal: Decreased range of motion left hip.  Shortening of left lower extremity.  No cyanosis, or erythema of extremities. Neurologic:  Face is symmetric. Moving all extremities. No gross focal neurologic deficits . Skin: Skin is warm, dry.  No rash or ulcers Psychiatric: Mood and affect are normal    Labs on Admission: I have personally reviewed following labs and imaging studies  CBC: Recent Labs  Lab 12/10/20 0437  WBC 6.7  NEUTROABS 3.7  HGB 12.8*  HCT 39.7  MCV 89.0  PLT 229   Basic Metabolic Panel: Recent Labs  Lab 12/10/20 0437  NA 142  K 4.0  CL 107  CO2 27  GLUCOSE 99  BUN 25*  CREATININE 1.58*  CALCIUM 9.1   GFR: Estimated Creatinine Clearance: 31.1 mL/min (A) (by C-G formula based on SCr of 1.58 mg/dL (H)). Liver Function  Tests: No results for input(s): AST, ALT, ALKPHOS, BILITOT, PROT, ALBUMIN in the last 168 hours. No results for input(s): LIPASE, AMYLASE in the last 168 hours. No results for input(s): AMMONIA in the last 168 hours. Coagulation Profile: Recent Labs  Lab 12/10/20 0437  INR 1.1   Cardiac Enzymes: No results for input(s): CKTOTAL, CKMB, CKMBINDEX, TROPONINI in the last 168 hours. BNP (last 3 results) No results for input(s): PROBNP in the last 8760 hours. HbA1C: No results for input(s): HGBA1C in the last 72 hours. CBG: No results for input(s): GLUCAP in the last 168 hours. Lipid Profile: No results for input(s): CHOL, HDL, LDLCALC, TRIG, CHOLHDL, LDLDIRECT in the last 72 hours. Thyroid Function Tests: No results for input(s): TSH, T4TOTAL, FREET4, T3FREE, THYROIDAB in the last 72 hours. Anemia Panel: No results for input(s): VITAMINB12, FOLATE, FERRITIN, TIBC, IRON, RETICCTPCT in the last 72 hours. Urine analysis:    Component Value Date/Time   COLORURINE YELLOW (A) 09/07/2020 0213   APPEARANCEUR CLOUDY (A) 09/07/2020 0213   APPEARANCEUR Clear 06/28/2020 1532   LABSPEC 1.017 09/07/2020 0213   PHURINE 5.0 09/07/2020 0213   GLUCOSEU NEGATIVE 09/07/2020 0213   HGBUR SMALL (A) 09/07/2020 0213   BILIRUBINUR NEGATIVE 09/07/2020 0213   BILIRUBINUR Negative 06/28/2020 1532   KETONESUR NEGATIVE 09/07/2020 0213   PROTEINUR 30 (A) 09/07/2020 0213   UROBILINOGEN 0.2 03/26/2013 1331   NITRITE NEGATIVE 09/07/2020 0213   LEUKOCYTESUR LARGE (A) 09/07/2020 0213    Radiological Exams on Admission: DG Chest 1 View  Result Date: 12/10/2020 CLINICAL DATA:  Pt presents from home via EMS following a fall while trying to get to the restroom. Pt endorses left hip pain - notable shortening on exam. Pt states he hit his head - denies LOC. Hx of dementia EXAM: CHEST  1 VIEW COMPARISON:  08/28/2020 FINDINGS: Coarse  interstitial markings, most prominent in the left lower lung. No confluent airspace  disease or overt edema. Heart size and mediastinal contours are within normal limits. Aortic Atherosclerosis (ICD10-170.0). CABG marker. No effusion.  No pneumothorax. Sternotomy wires. Lumbar fixation hardware partially visualized. Vertebral endplate spurring at multiple levels in the thoracolumbar spine. IMPRESSION: No acute cardiopulmonary disease. Electronically Signed   By: Lucrezia Europe M.D.   On: 12/10/2020 05:53   CT HEAD WO CONTRAST (5MM)  Result Date: 12/10/2020 CLINICAL DATA:  Blunt trauma post fall EXAM: CT HEAD WITHOUT CONTRAST TECHNIQUE: Contiguous axial images were obtained from the base of the skull through the vertex without intravenous contrast. COMPARISON:  MR 08/09/2020 1 a FINDINGS: Brain: Diffuse parenchymal atrophy. Patchy areas of hypoattenuation in deep and periventricular white matter bilaterally. Negative for acute intracranial hemorrhage, mass lesion, acute infarction, midline shift, or mass-effect. Acute infarct may be inapparent on noncontrast CT. Ventricles and sulci symmetric. Vascular: Atherosclerotic and physiologic intracranial calcifications. Skull: Eccentric sclerotic slightly expansile lesion in the clivus centered to the left of midline as described on prior studies back to 12/13/2012, nonspecific but possibly chondroid lesion. Negative for fracture or acute lesion. Sinuses/Orbits: Retention cyst or polyp in the sphenoid sinus. No acute findings. Other: None IMPRESSION: 1. Negative for bleed or other acute intracranial process. 2. Atrophy and nonspecific white matter changes. 3. Sclerotic osseous lesion in the clivus as described on prior studies back to 2014. Electronically Signed   By: Lucrezia Europe M.D.   On: 12/10/2020 05:57   CT Cervical Spine Wo Contrast  Result Date: 12/10/2020 CLINICAL DATA:  [place], blunt trauma to head EXAM: CT CERVICAL SPINE WITHOUT CONTRAST TECHNIQUE: Multidetector CT imaging of the cervical spine was performed without intravenous contrast.  Multiplanar CT image reconstructions were also generated. COMPARISON:  MR 12/12/2012 FINDINGS: Alignment: Normal Skull base and vertebrae: Slightly expansile mostly sclerotic lesion in the clivus centered to the left of midline, as seen on studies dating back to 2014. No fracture. Soft tissues and spinal canal: Enlarged heterogenous thyroid, incompletely visualized, with rightward displacement and mild narrowing of the trachea at the thoracic inlet. In the setting of significant comorbidities or limited life expectancy, no follow-up recommended (ref: J Am Coll Radiol. 2015 Feb;12(2): 143-50). No prevertebral swelling or fluid. Right calcified carotid bifurcation plaque. No evident canal hematoma. Disc levels: Mild narrowing C5-6 and C6-7 with small anterior and posterior endplate spurs. Moderate narrowing C7-T1 with endplate spurring. Multilevel facet DJD. Upper chest: Visualized lung apices clear. Other: Suture material in the subcutaneous tissues and nuchal ligament of the posterior cervical spine in the midline C4-C7. IMPRESSION: 1. Negative for fracture or other acute bone abnormality. 2. Cervical spondylitic and postop changes as above 3. Sclerotic osseous lesion in the clivus, as described on prior studies back to 2014. Electronically Signed   By: Lucrezia Europe M.D.   On: 12/10/2020 06:05   CT PELVIS WO CONTRAST  Result Date: 12/10/2020 CLINICAL DATA:  87 year old male status post fall with left hip pain, left femoral neck fracture suspected on radiographs. EXAM: CT PELVIS WITHOUT CONTRAST TECHNIQUE: Multidetector CT imaging of the pelvis was performed following the standard protocol without intravenous contrast. COMPARISON:  Left hip series 0 455 hours today. CT Abdomen and Pelvis 08/28/2020. FINDINGS: Urinary Tract: Partially visible left renal nephrocalcinosis and exophytic cysts. Stable small right renal lower pole cyst. No hydroureter. Extensive bladder diverticula appear stable. Pelvic phleboliths.  Bowel: New stool ball in the rectum, about 6 cm diameter with chronic retained  stool in the sigmoid. But visible large and small bowel loops are nondilated. Normal gas-filled appendix on series 3, image 42. Vascular/Lymphatic: Aortoiliac calcified atherosclerosis. Vascular patency is not evaluated in the absence of IV contrast. No lymphadenopathy identified. Reproductive:  Negative. Other:  No pelvic free fluid.  No pelvic sidewall hematoma. Musculoskeletal: Chronic lumbar degeneration, decompression and fusion. Visible lumbar hardware appears stable and intact. Sacrum and SI joints appear stable and intact. Pelvis and proximal right femur appears stable and intact. Transverse fracture subcapital left femoral neck is mildly angulated and impacted (series 3, image 95). Small comminution fragment on series 4, image 69. The intertrochanteric segment remains intact. The left femoral head is normally located. IMPRESSION: 1. Confirmed left femoral neck fracture, subcapital mildly angulated, impacted, and slightly comminuted. 2. No other acute traumatic injury identified in the pelvis. 3. Increased rectal stool, consider mild fecal impaction. Chronic bladder diverticula, left nephrocalcinosis. 4. Aortic Atherosclerosis (ICD10-I70.0). Electronically Signed   By: Genevie Ann M.D.   On: 12/10/2020 07:17   DG Hip Unilat W or Wo Pelvis 2-3 Views Left  Result Date: 12/10/2020 CLINICAL DATA:  [place], left hip pain EXAM: DG HIP (WITH OR WITHOUT PELVIS) 2-3V LEFT COMPARISON:  07/18/2020 FINDINGS: Probable impacted left femoral neck fracture. No dislocation. Bony pelvis intact. Fixation hardware in the lower lumbar spine partially visualized. Degenerative disc disease L5-S1. Bilateral pelvic phleboliths. Patchy arterial calcifications. IMPRESSION: Probable impacted left femoral neck fracture, new since previous. Electronically Signed   By: Lucrezia Europe M.D.   On: 12/10/2020 05:52     Assessment/Plan Principal Problem:   Closed  displaced fracture of left femoral neck (HCC) Active Problems:   Dementia due to Parkinson's disease without behavioral disturbance (HCC)   Hypertension   Coronary artery disease involving coronary bypass graft of native heart without angina pectoris   CKD (chronic kidney disease) stage 3, GFR 30-59 ml/min (HCC)   At high risk for falls     Patient is a 87 year old male who presents to the ER for evaluation following a fall and is found to have a left femoral neck fracture    Status post fall with closed displaced fracture of left femoral neck Immobilize left lower extremity Pain control Muscle relaxants Consult orthopedic surgery Place patient on fall precautions   History of dementia due to Parkinson's disease Continue donepezil    Hypertension with stage IIIb chronic kidney disease Renal function is stable Continue Cozaar    BPH Continue Flomax  DVT prophylaxis: SCD  Code Status: full code  Family Communication: Greater than 50% of time was spent discussing patient's condition and plan of care with his wife at the bedside.  All questions and concerns have been addressed.  She verbalizes understanding and agree with the plan.  CODE STATUS was discussed and patient remains a full code at this time. Disposition Plan: Back to previous home environment Consults called: Orthopedic surgery Status:At the time of admission, it appears that the appropriate admission status for this patient is inpatient. This is judged to be reasonable and necessary to provide the required intensity of service to ensure the patient's safety given the presenting symptoms, physical exam findings, and initial radiographic and laboratory data in the context of their comorbid conditions. Patient requires inpatient status due to high intensity of service, high risk for further deterioration and high frequency of surveillance required.     Jamekia Gannett MD [facility]     12/10/2020,  10:00 AM

## 2020-12-10 NOTE — ED Provider Notes (Signed)
[facility] Emergency Department Provider Note  ____________________________________________  Time seen: Approximately 5:42 AM  I have reviewed the triage vital signs and the nursing notes.   HISTORY  Chief Complaint Fall and Hip Pain (Left)   HPI Jason W Daking Westervelt. is a 87 y.o. male with history of COPD, CAD, GERD, hypertension, hyperlipidemia, peripheral neuropathy, OSA, CKD who presents for evaluation of a fall.  Patient reports that he got up to go to the bathroom this morning when he lost his balance and fell backwards.  He is complaining of left hip pain.  He reports that he did not hit his head.  He takes an aspirin at home.  He denies headache, neck pain, back pain, chest pain, shortness of breath, abdominal pain, right lower extremity or bilateral upper extremity pain.  Patient reports that he lost his balance and fell and that the fall was mechanical in nature.   Past Medical History:  Diagnosis Date   Arthritis    Benign prostate hyperplasia    Cataracts, bilateral    Coarse tremors    L>R hands   COPD (chronic obstructive pulmonary disease) (HCC)    first stages   Coronary artery disease    Dizziness    occasional   Gastric ulcer    in the 60's   GERD (gastroesophageal reflux disease)    takes Omeprazole every other day   Hard of hearing    doesn't wear hearing aids   History of colon polyps    History of kidney stones    still one on the left side, low   Hyperlipidemia    takes Simvastatin daily   Hypertension    stopped BP meds, BP under control   Macular degeneration    Myocardial infarction ([place]) 2009   Peripheral neuropathy    takes Gabapentin daily   Pneumonia    hx of in the 60's   Sleep apnea     Patient Active Problem List   Diagnosis Date Noted   Coronary artery disease due to lipid rich plaque    Benign prostatic hyperplasia with urinary frequency    Weakness    At high risk for falls 06/28/2020   Senile  purpura ([place]) 06/22/2020   Aortic atherosclerosis ([place]) 06/05/2020   Scoliosis of thoracolumbar spine 06/03/2020   History of 2019 novel coronavirus disease (COVID-19) 03/13/2020   CKD (chronic kidney disease) stage 3, GFR 30-59 ml/min ([place]) 11/26/2019   Difficulty walking 08/04/2017   Expressive aphasia 08/04/2017   Parkinson disease ([place]) 08/04/2017   Advanced care planning/counseling discussion 07/21/2016   GERD (gastroesophageal reflux disease) 12/30/2015   Neuropathy 07/17/2015   Dementia due to Parkinson's disease without behavioral disturbance ([place]) 07/17/2015   Orthostatic hypotension 07/17/2015   Hypertension 07/17/2015   Coronary artery disease involving coronary bypass graft of native heart without angina pectoris 07/17/2015   Urinary retention with incomplete bladder emptying 03/20/2013   Constipation due to slow transit 03/20/2013   Lumbar spondylolysis 03/14/2013   Lumbar scoliosis 03/10/2013    Past Surgical History:  Procedure Laterality Date   BACK SURGERY  2007   COLONOSCOPY     CORONARY ARTERY BYPASS GRAFT  2009   LIMA to LAD, SVG to D1 07/2007   EYE SURGERY Bilateral 2004, 2005   implants   GREEN LIGHT LASER TURP (TRANSURETHRAL RESECTION OF PROSTATE N/A 05/10/2013   Procedure: GREEN LIGHT LASER TURP (TRANSURETHRAL RESECTION OF PROSTATE;  Surgeon: Ardis Hughs, MD;  Location: [facility] ORS;  Service: Urology;  Laterality: N/A;   lipoma removed     chest wall   microthermo prostate  2009   [place] SURGERY  10/14   THORACIC DISCECTOMY N/A 03/10/2013   Procedure: Thoracic twelve-Lumbar one Laminectomy;  Surgeon: Erline Levine, MD;  Location: [facility];  Service: Neurosurgery;  Laterality: N/A;  Thoracic twelve-Lumbar one Laminectomy   TONSILLECTOMY     at age 75    Prior to Admission medications   Medication Sig Start Date End Date Taking? Authorizing Provider  acetaminophen (TYLENOL) 500 MG tablet Take 500 mg by mouth every 6 (six)  hours as needed.    [provider]  aspirin EC 81 MG tablet Take 81 mg by mouth every morning.    [provider]  donepezil (ARICEPT) 10 MG tablet Take 1 tablet (10 mg total) by mouth at bedtime. 06/26/19   Cannady, Henrine Screws T, NP  gabapentin (NEURONTIN) 300 MG capsule Take 300 mg by mouth 4 (four) times daily. 06/12/19   [provider]  losartan (COZAAR) 25 MG tablet Take 25 mg by mouth daily. 06/03/19   [provider]  melatonin 1 MG TABS tablet Take 1 tablet (1 mg total) by mouth at bedtime as needed. 08/02/20   Cannady, Henrine Screws T, NP  Multiple Vitamins-Minerals (PRESERVISION AREDS 2 PO) Take 2 tablets by mouth daily.    [provider]  omeprazole (PRILOSEC) 40 MG capsule Take 40 mg by mouth daily. 08/28/19   [provider]  silodosin (RAPAFLO) 8 MG CAPS capsule Take 1 capsule (8 mg total) by mouth daily with breakfast. 09/12/20   Vaillancourt, Aldona Bar, PA-C    Allergies Contrast media [iodinated diagnostic agents] and Codeine  Family History  Problem Relation Age of Onset   Hip fracture Mother    Heart disease Father    Hypertension Father    Heart attack Father    Thyroid disease Father    Cancer Sister        breast   Breast cancer Sister     Social History Social History   Tobacco Use   Smoking status: Former    Years: 40.00    Types: Cigarettes    Quit date: 02/23/1993    Years since quitting: 27.8   Smokeless tobacco: Never  Vaping Use   Vaping Use: Never used  Substance Use Topics   Alcohol use: No   Drug use: No    Review of Systems  Constitutional: Negative for fever. Eyes: Negative for visual changes. ENT: Negative for facial injury or neck injury Cardiovascular: Negative for chest injury. Respiratory: Negative for shortness of breath. Negative for chest wall injury. Gastrointestinal: Negative for abdominal pain or injury. Genitourinary: Negative for dysuria. Musculoskeletal: Negative for back injury, + L  hip pain Skin: Negative for laceration/abrasions. Neurological: + head injury.   ____________________________________________   PHYSICAL EXAM:  VITAL SIGNS: ED Triage Vitals  Enc Vitals Group     BP 12/10/20 0445 (!) 184/94     Pulse Rate 12/10/20 0445 70     Resp 12/10/20 0445 16     Temp 12/10/20 0445 97.9 F (36.6 C)     Temp Source 12/10/20 0445 Oral     SpO2 12/10/20 0445 98 %     Weight 12/10/20 0445 170 lb (77.1 kg)     Height 12/10/20 0445 5\' 9"  (1.753 m)     Head Circumference --      Peak Flow --  Pain Score 12/10/20 0442 10     Pain Loc --      Pain Edu? --      Excl. in [place]? --     Full spinal precautions maintained throughout the trauma exam. Constitutional: Alert and oriented. No acute distress. Does not appear intoxicated. HEENT Head: Normocephalic and atraumatic. Face: No facial bony tenderness. Stable midface Ears: No hemotympanum bilaterally. No Battle sign Eyes: No eye injury. PERRL. No raccoon eyes Nose: Nontender. No epistaxis. No rhinorrhea Mouth/Throat: Mucous membranes are moist. No oropharyngeal blood. No dental injury. Airway patent without stridor. Normal voice. Neck: no C-collar. No midline c-spine tenderness.  Cardiovascular: Normal rate, regular rhythm. Normal and symmetric distal pulses are present in all extremities. Pulmonary/Chest: Chest wall is stable and nontender to palpation/compression. Normal respiratory effort. Breath sounds are normal. No crepitus.  Abdominal: Soft, nontender, non distended. Musculoskeletal: LLE is shortened and externally rotated. Nontender with normal full range of motion in all other extremities. No thoracic or lumbar midline spinal tenderness. Pelvis is stable. Skin: Skin is warm, dry and intact. No abrasions or contutions. Psychiatric: Speech and behavior are appropriate. Neurological: Normal speech and language. Moves all extremities to command. No gross focal neurologic deficits are  appreciated.  Glascow Coma Score: 4 - Opens eyes on own 6 - Follows simple motor commands 5 - Alert and oriented GCS: 15   ____________________________________________   LABS (all labs ordered are listed, but only abnormal results are displayed)  Labs Reviewed  CBC WITH DIFFERENTIAL/PLATELET - Abnormal; Notable for the following components:      Result Value   Hemoglobin 12.8 (*)    All other components within normal limits  BASIC METABOLIC PANEL - Abnormal; Notable for the following components:   BUN 25 (*)    Creatinine, Ser 1.58 (*)    GFR, Estimated 41 (*)    All other components within normal limits  PROTIME-INR  APTT  TYPE AND SCREEN   ____________________________________________  EKG  ED ECG REPORT I, Jason Cross, the attending physician, personally viewed and interpreted this ECG.  Sinus rhythm with occasional PVCs, rate of 65, left axis deviation, LAFB, no ST elevations or depression ____________________________________________  RADIOLOGY  I have personally reviewed the images performed during this visit and I agree with the Radiologist's read.   Interpretation by Radiologist:  DG Chest 1 View  Result Date: 12/10/2020 CLINICAL DATA:  Pt presents from home via EMS following a fall while trying to get to the restroom. Pt endorses left hip pain - notable shortening on exam. Pt states he hit his head - denies LOC. Hx of dementia EXAM: CHEST  1 VIEW COMPARISON:  08/28/2020 FINDINGS: Coarse interstitial markings, most prominent in the left lower lung. No confluent airspace disease or overt edema. Heart size and mediastinal contours are within normal limits. Aortic Atherosclerosis (ICD10-170.0). CABG marker. No effusion.  No pneumothorax. Sternotomy wires. Lumbar fixation hardware partially visualized. Vertebral endplate spurring at multiple levels in the thoracolumbar spine. IMPRESSION: No acute cardiopulmonary disease. Electronically Signed   By: Lucrezia Europe  M.D.   On: 12/10/2020 05:53   CT HEAD WO CONTRAST (5MM)  Result Date: 12/10/2020 CLINICAL DATA:  Blunt trauma post fall EXAM: CT HEAD WITHOUT CONTRAST TECHNIQUE: Contiguous axial images were obtained from the base of the skull through the vertex without intravenous contrast. COMPARISON:  MR 08/09/2020 1 a FINDINGS: Brain: Diffuse parenchymal atrophy. Patchy areas of hypoattenuation in deep and periventricular white matter bilaterally. Negative for acute intracranial hemorrhage, mass  lesion, acute infarction, midline shift, or mass-effect. Acute infarct may be inapparent on noncontrast CT. Ventricles and sulci symmetric. Vascular: Atherosclerotic and physiologic intracranial calcifications. Skull: Eccentric sclerotic slightly expansile lesion in the clivus centered to the left of midline as described on prior studies back to 12/13/2012, nonspecific but possibly chondroid lesion. Negative for fracture or acute lesion. Sinuses/Orbits: Retention cyst or polyp in the sphenoid sinus. No acute findings. Other: None IMPRESSION: 1. Negative for bleed or other acute intracranial process. 2. Atrophy and nonspecific white matter changes. 3. Sclerotic osseous lesion in the clivus as described on prior studies back to 2014. Electronically Signed   By: Lucrezia Europe M.D.   On: 12/10/2020 05:57   CT Cervical Spine Wo Contrast  Result Date: 12/10/2020 CLINICAL DATA:  [place], blunt trauma to head EXAM: CT CERVICAL SPINE WITHOUT CONTRAST TECHNIQUE: Multidetector CT imaging of the cervical spine was performed without intravenous contrast. Multiplanar CT image reconstructions were also generated. COMPARISON:  MR 12/12/2012 FINDINGS: Alignment: Normal Skull base and vertebrae: Slightly expansile mostly sclerotic lesion in the clivus centered to the left of midline, as seen on studies dating back to 2014. No fracture. Soft tissues and spinal canal: Enlarged heterogenous thyroid, incompletely visualized, with rightward displacement  and mild narrowing of the trachea at the thoracic inlet. In the setting of significant comorbidities or limited life expectancy, no follow-up recommended (ref: J Am Coll Radiol. 2015 Feb;12(2): 143-50). No prevertebral swelling or fluid. Right calcified carotid bifurcation plaque. No evident canal hematoma. Disc levels: Mild narrowing C5-6 and C6-7 with small anterior and posterior endplate spurs. Moderate narrowing C7-T1 with endplate spurring. Multilevel facet DJD. Upper chest: Visualized lung apices clear. Other: Suture material in the subcutaneous tissues and nuchal ligament of the posterior cervical spine in the midline C4-C7. IMPRESSION: 1. Negative for fracture or other acute bone abnormality. 2. Cervical spondylitic and postop changes as above 3. Sclerotic osseous lesion in the clivus, as described on prior studies back to 2014. Electronically Signed   By: Lucrezia Europe M.D.   On: 12/10/2020 06:05   DG Hip Unilat W or Wo Pelvis 2-3 Views Left  Result Date: 12/10/2020 CLINICAL DATA:  [place], left hip pain EXAM: DG HIP (WITH OR WITHOUT PELVIS) 2-3V LEFT COMPARISON:  07/18/2020 FINDINGS: Probable impacted left femoral neck fracture. No dislocation. Bony pelvis intact. Fixation hardware in the lower lumbar spine partially visualized. Degenerative disc disease L5-S1. Bilateral pelvic phleboliths. Patchy arterial calcifications. IMPRESSION: Probable impacted left femoral neck fracture, new since previous. Electronically Signed   By: Lucrezia Europe M.D.   On: 12/10/2020 05:52     ____________________________________________   PROCEDURES  Procedure(s) performed:yes .1-3 Lead EKG Interpretation Performed by: Jason Re, MD Authorized by: Jason Re, MD     Interpretation: non-specific     ECG rate assessment: normal     Rhythm: sinus rhythm     Ectopy: PVCs     Conduction: normal     Critical Care performed:  None ____________________________________________   INITIAL IMPRESSION /  ASSESSMENT AND PLAN / ED COURSE  87 y.o. male with history of COPD, CAD, GERD, hypertension, hyperlipidemia, peripheral neuropathy, OSA, CKD who presents for evaluation of a fall.  Patient mechanical fall from home with head trauma on aspirin and left hip pain with shortened externally rotated left lower extremity.  Head is otherwise atraumatic.  No other signs of trauma based on physical exam.  Head CT and cervical spine with no signs of traumatic injury.  X-ray of the pelvis  and left hip show questionable femoral neck fracture confirmed on CT.  EKG with no signs of dysrhythmias. Will admit to Hospitalist and consult ortho       ____________________________________________  Please note:  Patient was evaluated in Emergency Department today for the symptoms described in the history of present illness. Patient was evaluated in the context of the global COVID-19 pandemic, which necessitated consideration that the patient might be at risk for infection with the SARS-CoV-2 virus that causes COVID-19. Institutional protocols and algorithms that pertain to the evaluation of patients at risk for COVID-19 are in a state of rapid change based on information released by regulatory bodies including the CDC and federal and state organizations. These policies and algorithms were followed during the patient's care in the ED.  Some ED evaluations and interventions may be delayed as a result of limited staffing during the pandemic.   ____________________________________________   FINAL CLINICAL IMPRESSION(S) / ED DIAGNOSES   Final diagnoses:  Fall, initial encounter  Closed fracture of left hip, initial encounter ([place])      NEW MEDICATIONS STARTED DURING THIS VISIT:  ED Discharge Orders     None        Note:  This document was prepared using Dragon voice recognition software and may include unintentional dictation errors.    Jason Re, MD 12/10/20 [id number]

## 2020-12-10 NOTE — Anesthesia Preprocedure Evaluation (Signed)
Anesthesia Evaluation  Patient identified by MRN, date of birth, ID band Patient awake    Reviewed: Allergy & Precautions, NPO status , Patient's Chart, lab work & pertinent test results  History of Anesthesia Complications Negative for: history of anesthetic complications  Airway Mallampati: III  TM Distance: >3 FB     Dental  (+) Upper Dentures, Lower Dentures   Pulmonary sleep apnea , COPD, former smoker,    breath sounds clear to auscultation- rhonchi (-) wheezing      Cardiovascular hypertension, Pt. on medications + CAD, + Past MI and + CABG   Rhythm:Regular Rate:Normal - Systolic murmurs and - Diastolic murmurs    Neuro/Psych neg Seizures PSYCHIATRIC DISORDERS Dementia Parkinson's disease     GI/Hepatic Neg liver ROS, PUD, GERD  ,  Endo/Other  negative endocrine ROSneg diabetes  Renal/GU CRFRenal disease     Musculoskeletal  (+) Arthritis ,   Abdominal (+) - obese,   Peds  Hematology negative hematology ROS (+)   Anesthesia Other Findings Past Medical History: No date: Arthritis No date: Benign prostate hyperplasia No date: Cataracts, bilateral No date: Coarse tremors     Comment:  L>R hands No date: COPD (chronic obstructive pulmonary disease) (HCC)     Comment:  first stages No date: Coronary artery disease No date: Dizziness     Comment:  occasional No date: Gastric ulcer     Comment:  in the 60's No date: GERD (gastroesophageal reflux disease)     Comment:  takes Omeprazole every other day No date: Hard of hearing     Comment:  doesn't wear hearing aids No date: History of colon polyps No date: History of kidney stones     Comment:  still one on the left side, low No date: Hyperlipidemia     Comment:  takes Simvastatin daily No date: Hypertension     Comment:  stopped BP meds, BP under control No date: Macular degeneration 2009: Myocardial infarction ([place]) No date: Peripheral  neuropathy     Comment:  takes Gabapentin daily No date: Pneumonia     Comment:  hx of in the 60's No date: Sleep apnea   Reproductive/Obstetrics                             Lab Results  Component Value Date   WBC 6.7 12/10/2020   HGB 12.8 (L) 12/10/2020   HCT 39.7 12/10/2020   MCV 89.0 12/10/2020   PLT 231 12/10/2020    Anesthesia Physical Anesthesia Plan  ASA: 3  Anesthesia Plan: Spinal   Post-op Pain Management:    Induction:   PONV Risk Score and Plan: 1 and Propofol infusion  Airway Management Planned: Natural Airway  Additional Equipment:   Intra-op Plan:   Post-operative Plan:   Informed Consent: I have reviewed the patients History and Physical, chart, labs and discussed the procedure including the risks, benefits and alternatives for the proposed anesthesia with the patient or authorized representative who has indicated his/her understanding and acceptance.     Dental advisory given and Consent reviewed with POA (consent from patient's wife)  Plan Discussed with: CRNA and Anesthesiologist  Anesthesia Plan Comments: (Discussed backup plan for GA)        Anesthesia Quick Evaluation

## 2020-12-10 NOTE — ED Notes (Signed)
Pt changed into brief and chucks.

## 2020-12-10 NOTE — Anesthesia Procedure Notes (Signed)
Spinal  Patient location during procedure: OR Start time: 12/10/2020 2:16 PM End time: 12/10/2020 2:36 PM Reason for block: surgical anesthesia Staffing Performed: resident/CRNA  Anesthesiologist: Emmie Niemann, MD Resident/CRNA: Lowry Bowl, CRNA Preanesthetic Checklist Completed: patient identified, IV checked, site marked, risks and benefits discussed, surgical consent, monitors and equipment checked, pre-op evaluation and timeout performed Spinal Block Patient position: right lateral decubitus Prep: ChloraPrep Patient monitoring: heart rate, cardiac monitor, continuous pulse ox and blood pressure Approach: midline Location: L3-4 Injection technique: single-shot Needle Needle type: Sprotte  Needle gauge: 24 G Needle length: 9 cm Assessment Sensory level: T4 Events: CSF return

## 2020-12-10 NOTE — ED Triage Notes (Signed)
Pt presents from home via EMS following a fall while trying to get to the restroom. Pt endorses left hip pain - notable shortening on exam. Pt states he hit his head - denies LOC. Hx of dementia.

## 2020-12-10 NOTE — Op Note (Signed)
12/10/2020  4:22 PM  Patient:   Jason Cross.  Pre-Op Diagnosis:   Displaced femoral neck fracture, left hip.  Post-Op Diagnosis:   Same.  Procedure:   Left hip bipolar hemiarthroplasty.  Surgeon:   Pascal Lux, MD  Assistant:   Cameron Proud, PA-C  Anesthesia:   Spinal  Findings:   As above.  Complications:   None  EBL:   100 cc  Fluids:   700 cc crystalloid  UOP:   None  TT:   None  Drains:   None  Closure:   Staples  Implants:   Biomet press-fit system with a #15 laterally offset reduced proximal profile Echo femoral stem, a 57 mm outer diameter shell, and a 28 mm head with a +0 mm neck adapter.  Brief Clinical Note:   The patient is a 87 year old male who sustained the above-noted injury earlier this morning when he apparently lost his balance and fell onto his left hip while going to the bathroom at his home. He was brought to the emergency room where x-rays demonstrated the above-noted injury. The patient has been cleared medically and presents at this time for definitive management of the injury.  Procedure:   The patient was brought into the operating room. After adequate spinal anesthesia was obtained, the patient was repositioned in the right lateral decubitus position and secured using a lateral hip positioner. The left hip and lower extremity were prepped with ChloroPrep solution before being draped sterilely. Preoperative antibiotics were administered. A timeout was performed to verify the appropriate surgical site.    A standard posterior approach to the hip was made through an approximately 4-5 inch incision. The incision was carried down through the subcutaneous tissues to expose the gluteal fascia and proximal end of the iliotibial band. These structures were split the length of the incision and the Charnley self-retaining hip retractor placed. The bursal tissues were swept posteriorly to expose the short external rotators. The anterior border of  the piriformis tendon was identified and this plane developed down through the capsule to enter the joint. Abundant fracture hematoma was suctioned. A flap of tissue was elevated off the posterior aspect of the femoral neck and greater trochanter and retracted posteriorly. This flap included the piriformis tendon, the short external rotators, and the posterior capsule. The femoral head was removed in its entirety, then taken to the back table where it was measured and found to be optimally replicated by a 57 mm head. The appropriate trial head was inserted and found to demonstrate an excellent suction fit.   Attention was directed to the femoral side. The femoral neck was recut 10-12 mm above the lesser trochanter using an oscillating saw. The piriformis fossa was debrided of soft tissues before the intramedullary canal was accessed through this point using a triple step reamer. The canal was reamed sequentially beginning with a #7 tapered reamer and progressing to a #15 tapered reamer. This provided excellent circumferential chatter. A box osteotome was used to establish version before the canal was broached sequentially beginning with a #12 broach and progressing to a #15 broach. This was left in place and several trial reductions performed. The permanent #15 laterally offset reduced proximal profile femoral stem was impacted into place. A repeat trial reduction was performed using both the -3 mm and +0 mm neck lengths. The +0 mm neck length demonstrated excellent stability both in extension and external rotation as well as with flexion to 90 and internal rotation beyond  70. It also was stable in the position of sleep. The 57 mm outer diameter shell with the +0 mm neck adapter and 28 mm head construct was put together on the back table before being impacted onto the stem of the femoral component. The Morse taper locking mechanism was verified using manual distraction before the head was relocated and the hip  placed through a range of motion with the findings as described above.  The wound was copiously irrigated with sterile saline solution via the jet lavage system before the peri-incisional and pericapsular tissues were injected with 30 cc of 0.5% Sensorcaine with epinephrine and 20 cc of Exparel diluted out to 60 cc with normal saline to help with postoperative analgesia. The posterior flap was reapproximated to the posterior aspect of the greater trochanter using #2 Tycron interrupted sutures placed through drill holes. The iliotibial band was reapproximated using #1 Vicryl interrupted sutures before the gluteal fascia was closed using a running #1 Vicryl suture. At this point, 1 g of transexemic acid in 10 cc of normal saline was injected into the joint to help reduce postoperative bleeding. The subcutaneous tissues were closed in several layers using 2-0 Vicryl interrupted sutures before the skin was closed using staples. A sterile occlusive dressing was applied to the wound . The patient then was rolled back into the supine position on the hospital bed before being awakened and returned to the recovery room in satisfactory condition after tolerating the procedure well.

## 2020-12-10 NOTE — ED Notes (Signed)
Pt given urinal, unable to urinate at this time.

## 2020-12-10 NOTE — Transfer of Care (Signed)
Immediate Anesthesia Transfer of Care Note  Patient: Jason Cross.  Procedure(s) Performed: ARTHROPLASTY BIPOLAR HIP (HEMIARTHROPLASTY) (Left: Hip)  Patient Location: PACU  Anesthesia Type:Spinal  Level of Consciousness: drowsy and patient cooperative  Airway & Oxygen Therapy: Patient Spontanous Breathing and Patient connected to face mask oxygen  Post-op Assessment: Report given to RN and Post -op Vital signs reviewed and stable  Post vital signs: Reviewed and stable  Last Vitals:  Vitals Value Taken Time  BP 100/62 12/10/20 1623  Temp    Pulse 82 12/10/20 1626  Resp 15 12/10/20 1626  SpO2 100 % 12/10/20 1626  Vitals shown include unvalidated device data.  Last Pain:  Vitals:   12/10/20 0445  TempSrc: Oral  PainSc:          Complications: No notable events documented.

## 2020-12-11 ENCOUNTER — Encounter: Payer: Self-pay | Admitting: Surgery

## 2020-12-11 DIAGNOSIS — E440 Moderate protein-calorie malnutrition: Secondary | ICD-10-CM | POA: Insufficient documentation

## 2020-12-11 DIAGNOSIS — S72002A Fracture of unspecified part of neck of left femur, initial encounter for closed fracture: Secondary | ICD-10-CM | POA: Diagnosis not present

## 2020-12-11 DIAGNOSIS — Z96649 Presence of unspecified artificial hip joint: Secondary | ICD-10-CM | POA: Insufficient documentation

## 2020-12-11 LAB — BASIC METABOLIC PANEL
Anion gap: 7 (ref 5–15)
BUN: 23 mg/dL (ref 8–23)
CO2: 23 mmol/L (ref 22–32)
Calcium: 8.6 mg/dL — ABNORMAL LOW (ref 8.9–10.3)
Chloride: 107 mmol/L (ref 98–111)
Creatinine, Ser: 1.5 mg/dL — ABNORMAL HIGH (ref 0.61–1.24)
GFR, Estimated: 44 mL/min — ABNORMAL LOW (ref >60)
Glucose, Bld: 124 mg/dL — ABNORMAL HIGH (ref 70–99)
Potassium: 4.4 mmol/L (ref 3.5–5.1)
Sodium: 137 mmol/L (ref 135–145)

## 2020-12-11 LAB — CBC
HCT: 36.4 % — ABNORMAL LOW (ref 39.0–52.0)
Hemoglobin: 12.3 g/dL — ABNORMAL LOW (ref 13.0–17.0)
MCH: 29.5 pg (ref 26.0–34.0)
MCHC: 33.8 g/dL (ref 30.0–36.0)
MCV: 87.3 fL (ref 80.0–100.0)
Platelets: 187 10*3/uL (ref 150–400)
RBC: 4.17 MIL/uL — ABNORMAL LOW (ref 4.22–5.81)
RDW: 15.2 % (ref 11.5–15.5)
WBC: 11.6 10*3/uL — ABNORMAL HIGH (ref 4.0–10.5)
nRBC: 0 % (ref 0.0–0.2)

## 2020-12-11 MED ORDER — ADULT MULTIVITAMIN W/MINERALS CH
1.0000 | ORAL_TABLET | Freq: Every day | ORAL | Status: DC
  Administered 2020-12-12 – 2020-12-18 (×7): 1 via ORAL
  Filled 2020-12-11 (×8): qty 1

## 2020-12-11 MED ORDER — ENSURE ENLIVE PO LIQD
237.0000 mL | Freq: Two times a day (BID) | ORAL | Status: DC
  Administered 2020-12-11 – 2020-12-17 (×13): 237 mL via ORAL

## 2020-12-11 NOTE — Progress Notes (Signed)
Initial Nutrition Assessment  DOCUMENTATION CODES:  Non-severe (moderate) malnutrition in context of chronic illness  INTERVENTION:  Liberalize to regular diet due to advanced age and poor intake Ensure Enlive po BID, each supplement provides 350 kcal and 20 grams of protein MVI with minerals daily Magic cup TID with meals, each supplement provides 290 kcal and 9 grams of protein  NUTRITION DIAGNOSIS:  Moderate Malnutrition related to chronic illness (COPD) as evidenced by mild muscle depletion, mild fat depletion, moderate muscle depletion.  GOAL:  Patient will meet greater than or equal to 90% of their needs  MONITOR:  Supplement acceptance, PO intake, Skin  REASON FOR ASSESSMENT:  Consult Hip fracture protocol  ASSESSMENT:  87 y.o. male with history of COPD, CAD, GERD, HTN, HLD, neuropathy, CKD and hx MI presented to ED with pain after a fall at home.  Imaging in ED showed a displaced left femoral neck fracture. Orthopedics consulted and took pt for surgical repair 10/18.  Pt resting in bed at the time of assessment. Family shaving pt at the bedside. Pt hard of hearing, family assists with nutrition hx. Pt reports that at home, his appetite is good but family endorses poor appetite here. Also reports that pt has been losing weight at home, drinking 1 ensure each day in addition to meals at home. Pt prefers vanilla. Family reports that pt does not like strawberry and chocolate causes diarrhea. Discussed magic cup, agreeable to receiving on trays. Prefers vanilla flavor as well.   Mild muscle and fat deficits present on exam.   Nutritionally Relevant Medications: Scheduled Meds:  docusate sodium  100 mg Oral BID   pantoprazole  40 mg Oral Daily   Continuous Infusions:   ceFAZolin (ANCEF) IV 2 g (12/11/20 0236)   PRN Meds: bisacodyl, diphenhydrAMINE, magnesium hydroxide, metoCLOPramide, ondansetron, sodium phosphate  Labs Reviewed: Creatinine 1.5  NUTRITION - FOCUSED  PHYSICAL EXAM: Flowsheet Row Most Recent Value  Orbital Region Mild depletion  Upper Arm Region Mild depletion  Thoracic and Lumbar Region Mild depletion  Buccal Region Mild depletion  Temple Region Mild depletion  Clavicle Bone Region Mild depletion  Clavicle and Acromion Bone Region Mild depletion  Scapular Bone Region Mild depletion  Dorsal Hand Mild depletion  Patellar Region Moderate depletion  Anterior Thigh Region Moderate depletion  Posterior Calf Region Moderate depletion  Edema (RD Assessment) None  Hair Reviewed  Eyes Reviewed  Mouth Reviewed  Skin Reviewed  Nails Reviewed   Diet Order:   Diet Order             Diet regular Room service appropriate? Yes; Fluid consistency: Thin  Diet effective now                   EDUCATION NEEDS:  Education needs have been addressed  Skin:  Skin Assessment: Reviewed RN Assessment (surgical incisions, left hip)  Last BM:  10/16  Height:  Ht Readings from Last 1 Encounters:  12/10/20 5\' 9"  (1.753 m)    Weight:  Wt Readings from Last 1 Encounters:  12/10/20 77.1 kg    Ideal Body Weight:  72.7 kg  BMI:  Body mass index is 25.1 kg/m.  Estimated Nutritional Needs:  Kcal:  1800-2000 kcal/d Protein:  90-100 g/d Fluid:  2-2.2L/d   Ranell Patrick, RD, LDN Clinical Dietitian RD pager # available in [facility]  After hours/weekend pager # available in [facility]

## 2020-12-11 NOTE — Progress Notes (Signed)
Pt alert oriented X2-3, no complains of pain. No sign of respiratory distress on room air. Removed foley catheter in the morning. Pt has altered mental status with the morning assessment, wife stated "sundowning". Will continue to monitor.

## 2020-12-11 NOTE — Progress Notes (Signed)
Subjective: 1 Day Post-Op Procedure(s) (LRB): ARTHROPLASTY BIPOLAR HIP (HEMIARTHROPLASTY) (Left) Patient reports pain as moderate.   Patient is well, and has had no acute complaints or problems Current plan is for discharge to SNF when able. Negative for chest pain and shortness of breath Fever: no Gastrointestinal:negative for nausea and vomiting Patient with history of dementia, wife is in the room with him currently.  Objective: Vital signs in last 24 hours: Temp:  [98.2 F (36.8 C)-101.3 F (38.5 C)] 98.6 F (37 C) (10/19 1222) Pulse Rate:  [72-97] 93 (10/19 1222) Resp:  [12-18] 18 (10/19 1222) BP: (100-149)/(61-81) 129/72 (10/19 1222) SpO2:  [94 %-100 %] 96 % (10/19 1222)  Intake/Output from previous day:  Intake/Output Summary (Last 24 hours) at 12/11/2020 1243 Last data filed at 12/11/2020 0535 Gross per 24 hour  Intake 1685.87 ml  Output 1250 ml  Net 435.87 ml    Intake/Output this shift: No intake/output data recorded.  Labs: Recent Labs    12/10/20 0437 12/11/20 0347  HGB 12.8* 12.3*   Recent Labs    12/10/20 0437 12/11/20 0347  WBC 6.7 11.6*  RBC 4.46 4.17*  HCT 39.7 36.4*  PLT 231 187   Recent Labs    12/10/20 0437 12/11/20 0347  NA 142 137  K 4.0 4.4  CL 107 107  CO2 27 23  BUN 25* 23  CREATININE 1.58* 1.50*  GLUCOSE 99 124*  CALCIUM 9.1 8.6*   Recent Labs    12/10/20 0437  INR 1.1     EXAM General - Patient is Alert, Confused, and Lacking, patient with history of dementia. Extremity - ABD soft Neurovascular intact Dorsiflexion/Plantar flexion intact Incision: dressing C/D/I No cellulitis present Compartment soft Dressing/Incision - clean, dry, no drainage Motor Function - intact, moving foot and toes well on exam.  Abdomen soft with normal bowel sounds.  Past Medical History:  Diagnosis Date   Arthritis    Benign prostate hyperplasia    Cataracts, bilateral    Coarse tremors    L>R hands   COPD (chronic  obstructive pulmonary disease) (HCC)    first stages   Coronary artery disease    Dizziness    occasional   Gastric ulcer    in the 60's   GERD (gastroesophageal reflux disease)    takes Omeprazole every other day   Hard of hearing    doesn't wear hearing aids   History of colon polyps    History of kidney stones    still one on the left side, low   Hyperlipidemia    takes Simvastatin daily   Hypertension    stopped BP meds, BP under control   Macular degeneration    Myocardial infarction [facility]) 2009   Peripheral neuropathy    takes Gabapentin daily   Pneumonia    hx of in the 60's   Sleep apnea     Assessment/Plan: 1 Day Post-Op Procedure(s) (LRB): ARTHROPLASTY BIPOLAR HIP (HEMIARTHROPLASTY) (Left) Principal Problem:   Closed displaced fracture of left femoral neck (HCC) Active Problems:   Dementia due to Parkinson's disease without behavioral disturbance ([place])   Hypertension   Coronary artery disease involving coronary bypass graft of native heart without angina pectoris   CKD (chronic kidney disease) stage 3, GFR 30-59 ml/min (HCC)   At high risk for falls  Estimated body mass index is 25.1 kg/m as calculated from the following:   Height as of this encounter: 5\' 9"  (1.753 m).   Weight as of this encounter:  77.1 kg. Advance diet Up with therapy D/C IV fluids when tolerating po intake.  Labs reviewed this AM.  Hg 12.3 this AM. Patient is passing gas without pain today.  Work on [employer]. Up with therapy.  Plan is for SNF upon discharge.  DVT Prophylaxis - Lovenox and Foot Pumps Weight-Bearing as tolerated to left leg  J. Cameron Proud, PA-C [facility] Orthopaedic Surgery 12/11/2020, 12:43 PM

## 2020-12-11 NOTE — Evaluation (Signed)
Occupational Therapy Evaluation Patient Details Name: Jason Cross. MRN: [id number] DOB: 16-Dec-1928 Today's Date: 12/11/2020   History of Present Illness Jason Cross. is a 87 y.o. male with medical history significant for dementia, Parkinson's disease, coronary artery disease status post CABG, peripheral neuropathy, GERD who presents to the ER via EMS for evaluation following a fall at home.   Clinical Impression   Pt seen for OT evaluation this date. Upon arrival to room, pt seated upright in bed with wife present. Pt A&Ox1, with wife reporting that pt is more confused than usual. PLOF obtained via wife report d/t pt with cognition different than usual and pt with hx of dementia. Prior to admission, pt was living with wife in a 1-level home with 2 STE. At baseline, pt walks household distances with RW and wife provides SUPERVISION for functional transfers and SET-UP/MIN A for ADLs.  Pt currently requires MAX A for bed-level LB dressing and MOD-MAX A for bed mobility d/t LLE pain and difficulty motor planning. Pt demonstrates poor static sitting balance at EOB and was unable to engage is seated UB ADLs at EOB this date d/t requiring b/l UE support and intermittent MIN A for trunk support. Pt would benefit from additional skilled OT services to maximize return to PLOF and minimize risk of future falls, injury, caregiver burden, and readmission. Upon discharge, recommend SNF.       Recommendations for follow up therapy are one component of a multi-disciplinary discharge planning process, led by the attending physician.  Recommendations may be updated based on patient status, additional functional criteria and insurance authorization.   Follow Up Recommendations  SNF    Equipment Recommendations  Other (comment) (defer to next venue of care)       Precautions / Restrictions Precautions Precautions: Fall Restrictions Weight Bearing Restrictions: No      Mobility Bed  Mobility Overal bed mobility: Needs Assistance Bed Mobility: Supine to Sit;Sit to Supine     Supine to sit: Mod assist;HOB elevated Sit to supine: Max assist   General bed mobility comments: Multimodal cuing and LLE management needed    Transfers Overall transfer level: Needs assistance Equipment used: Rolling walker (2 wheeled) Transfers: Sit to/from Stand Sit to Stand: +2 physical assistance;Mod assist         General transfer comment: Pt with difficulty motor planning and with shifting hip anteriorly to obtain upright posture.    Balance Overall balance assessment: Needs assistance Sitting-balance support: Bilateral upper extremity supported;Feet supported Sitting balance-Leahy Scale: Poor Sitting balance - Comments: pt required intermittent MIN A for static sitting balance at EOB d/t posterior lean Postural control: Posterior lean Standing balance support: Bilateral upper extremity supported;During functional activity Standing balance-Leahy Scale: Poor Standing balance comment: Required MOD A+2 to maintain static standing balance                           ADL either performed or assessed with clinical judgement   ADL Overall ADL's : Needs assistance/impaired                     Lower Body Dressing: Maximal assistance;Bed level Lower Body Dressing Details (indicate cue type and reason): to don briefs               General ADL Comments: Pt requires MAX A+ for bed-level LB ADLs. Pt with poor static sitting balance at EOB and unable to engage in seated ADLs  d/t requiring b/l UE support.     Vision Baseline Vision/History: 1 Wears glasses              Pertinent Vitals/Pain Pain Assessment: Faces Faces Pain Scale: Hurts a little bit Pain Location: L hip Pain Intervention(s): Limited activity within patient's tolerance;Monitored during session;Repositioned        Extremity/Trunk Assessment Upper Extremity Assessment Upper Extremity  Assessment: Generalized weakness   Lower Extremity Assessment Lower Extremity Assessment: Generalized weakness RLE Sensation: history of peripheral neuropathy LLE Sensation: history of peripheral neuropathy       Communication Communication Communication: HOH   Cognition Arousal/Alertness: Awake/alert Behavior During Therapy: Flat affect Overall Cognitive Status: History of cognitive impairments - at baseline                                 General Comments: A&Ox1. Hx of parkinson's and dementia              Home Living Family/patient expects to be discharged to:: Private residence Living Arrangements: Spouse/significant other Available Help at Discharge: Family;Available 24 hours/day Type of Home: House Home Access: Stairs to enter [employer] of Steps: 2 Entrance Stairs-Rails: Left;Right Home Layout: One level         Biochemist, clinical: Standard Bathroom Accessibility: Yes   Home Equipment: Cane - single point;Bedside commode;Walker - 2 wheels   Additional Comments: Denies any falls since previous hospitalization besides the 1 fall leading to current admission. Reports needing additional help at home due to functional decline      Prior Functioning/Environment Level of Independence: Needs assistance  Gait / Transfers Assistance Needed: Household amb with RW ADL's / Homemaking Assistance Needed: Spouse reports assisting in safely transferring pt to toilet and shower. Pt able to bath himself but needs assistance with toileting clean up with BM, MIN assist/set up with dressing ADL's            OT Problem List: Decreased strength;Decreased activity tolerance;Decreased range of motion;Impaired balance (sitting and/or standing);Decreased coordination;Decreased cognition;Decreased safety awareness;Decreased knowledge of use of DME or AE;Pain      OT Treatment/Interventions: Self-care/ADL training;Therapeutic exercise;DME and/or AE  instruction;Therapeutic activities;Patient/family education;Balance training    OT Goals(Current goals can be found in the care plan section) Acute Rehab OT Goals Patient Stated Goal: to go to rehab OT Goal Formulation: With family Time For Goal Achievement: 12/25/20 Potential to Achieve Goals: Good ADL Goals Pt Will Perform Grooming: with set-up;with supervision;sitting Pt Will Perform Lower Body Dressing: with min assist;sit to/from stand Pt Will Transfer to Toilet: with min assist;stand pivot transfer;bedside commode  OT Frequency: Min 1X/week    AM-PAC OT "6 Clicks" Daily Activity     Outcome Measure Help from another person eating meals?: None Help from another person taking care of personal grooming?: A Little Help from another person toileting, which includes using toliet, bedpan, or urinal?: A Lot Help from another person bathing (including washing, rinsing, drying)?: A Lot Help from another person to put on and taking off regular upper body clothing?: A Little Help from another person to put on and taking off regular lower body clothing?: A Lot 6 Click Score: 16   End of Session Equipment Utilized During Treatment: Gait belt;Rolling walker Nurse Communication: Mobility status  Activity Tolerance: Patient tolerated treatment well Patient left: in bed;with call bell/phone within reach;with bed alarm set;with nursing/sitter in room;with family/visitor present  OT Visit Diagnosis: Unsteadiness on feet (R26.81);Muscle  weakness (generalized) (M62.81)                Time: 1520-1550 OT Time Calculation (min): 30 min Charges:  OT General Charges $OT Visit: 1 Visit OT Evaluation $OT Eval Moderate Complexity: 1 Mod OT Treatments $Self Care/Home Management : 23-37 mins  Fredirick Maudlin, OTR/L [address]

## 2020-12-11 NOTE — TOC Progression Note (Signed)
Transition of Care (TOC) - Progression Note    Patient Details  Name: Jason Cross. MRN: [id number] Date of Birth: 03-20-1928  Transition of Care [facility]) CM/SW [place], RN Phone Number: 12/11/2020, 1:32 PM  Clinical Narrative:    [facility] has made a bed offer and the patients Wife Jackelyn Poling has accepted the bedoffer, [facility] has started the insurance approval process        Expected Discharge Plan and Services                                                 Social Determinants of Health (SDOH) Interventions    Readmission Risk Interventions No flowsheet data found.

## 2020-12-11 NOTE — Progress Notes (Signed)
PROGRESS NOTE    Jason Cross.  XBL:[id number] DOB: 06/25/1928 DOA: 12/10/2020 PCP: Venita Lick, NP   Brief Narrative: Taken from H&P. Jason Cross. is a 87 y.o. male with medical history significant for dementia, Parkinson's disease, coronary artery disease status post CABG, peripheral neuropathy, GERD who presents to the ER via EMS for evaluation following a fall at home. At baseline patient ambulates with a rolling walker.  Patient with progressive decline and frequent falls for the past 63-month. Found to have impacted left femoral neck fracture, no other acute abnormality noted. Taken to the OR and underwent left bipolar hemiarthroplasty with orthopedic surgery on 12/10/2020. PT is recommending SNF-TOC started the work-up  Subjective: Patient was seen and examined today.  Per wife he appears little more confused than his baseline.  History of dementia and Parkinson's.  He was appropriately answering questions, oriented to self and time but not the place stating that he is at home.  No other complaints.  Wife is the primary caretaker and would like him to go to SNF before returning home.  Assessment & Plan:   Principal Problem:   Closed displaced fracture of left femoral neck (HCC) Active Problems:   Dementia due to Parkinson's disease without behavioral disturbance (HCC)   Hypertension   Coronary artery disease involving coronary bypass graft of native heart without angina pectoris   CKD (chronic kidney disease) stage 3, GFR 30-59 ml/min (HCC)   At high risk for falls  Left femoral neck fracture.  Secondary to mechanical fall s/p bipolar hemiarthroplasty. Pain seems well controlled under current regimen.  History of frequent falls and progressively worsening weakness with history of underlying Parkinson's disease and dementia. PT is recommending SNF. -Continue with pain management and PT.  History of Parkinson's disease and dementia.  Not on any  medications for Parkinson's at home. -Continue home dose of Aricept  CKD stage IIIb.  Creatinine seems stable. -Monitor renal function. -Avoid nephrotoxins.  Hypertension.  Blood pressure within goal. -Continue home Cozaar  BPH. -Continue home Flomax  Objective: Vitals:   12/10/20 2218 12/11/20 0515 12/11/20 0839 12/11/20 1222  BP: 125/70 (!) 149/75 128/71 129/72  Pulse: 77 97 87 93  Resp: 16 16 16 18   Temp: 98.3 F (36.8 C) 98.2 F (36.8 C) 98.9 F (37.2 C) 98.6 F (37 C)  TempSrc:  Oral Oral   SpO2: 95% 94% 95% 96%  Weight:      Height:        Intake/Output Summary (Last 24 hours) at 12/11/2020 1415 Last data filed at 12/11/2020 0535 Gross per 24 hour  Intake 1685.87 ml  Output 1250 ml  Net 435.87 ml   Filed Weights   12/10/20 0445  Weight: 77.1 kg    Examination:  General exam: Appears calm and comfortable  Respiratory system: Clear to auscultation. Respiratory effort normal. Cardiovascular system: S1 & S2 heard, RRR.  Gastrointestinal system: Soft, nontender, nondistended, bowel sounds positive. Central nervous system: Alert and oriented to self and time, not place. No focal neurological deficits. Extremities: No edema, no cyanosis, pulses intact and symmetrical. Psychiatry: Judgement and insight appear normal.   DVT prophylaxis: Lovenox Code Status: Full Family Communication: Discussed with wife at bedside Disposition Plan:  Status is: Inpatient  Remains inpatient appropriate because: Of severity of illness  Level of care: Med-Surg  All the records are reviewed and case discussed with Care Management/Social Worker. Management plans discussed with the patient, nursing and they are in agreement.  Consultants:  Orthopedic surgery  Procedures:  Antimicrobials:   Data Reviewed: I have personally reviewed following labs and imaging studies  CBC: Recent Labs  Lab 12/10/20 0437 12/11/20 0347  WBC 6.7 11.6*  NEUTROABS 3.7  --   HGB 12.8*  12.3*  HCT 39.7 36.4*  MCV 89.0 87.3  PLT 231 562   Basic Metabolic Panel: Recent Labs  Lab 12/10/20 0437 12/11/20 0347  NA 142 137  K 4.0 4.4  CL 107 107  CO2 27 23  GLUCOSE 99 124*  BUN 25* 23  CREATININE 1.58* 1.50*  CALCIUM 9.1 8.6*   GFR: Estimated Creatinine Clearance: 32.7 mL/min (A) (by C-G formula based on SCr of 1.5 mg/dL (H)). Liver Function Tests: No results for input(s): AST, ALT, ALKPHOS, BILITOT, PROT, ALBUMIN in the last 168 hours. No results for input(s): LIPASE, AMYLASE in the last 168 hours. No results for input(s): AMMONIA in the last 168 hours. Coagulation Profile: Recent Labs  Lab 12/10/20 0437  INR 1.1   Cardiac Enzymes: No results for input(s): CKTOTAL, CKMB, CKMBINDEX, TROPONINI in the last 168 hours. BNP (last 3 results) No results for input(s): PROBNP in the last 8760 hours. HbA1C: No results for input(s): HGBA1C in the last 72 hours. CBG: No results for input(s): GLUCAP in the last 168 hours. Lipid Profile: No results for input(s): CHOL, HDL, LDLCALC, TRIG, CHOLHDL, LDLDIRECT in the last 72 hours. Thyroid Function Tests: No results for input(s): TSH, T4TOTAL, FREET4, T3FREE, THYROIDAB in the last 72 hours. Anemia Panel: No results for input(s): VITAMINB12, FOLATE, FERRITIN, TIBC, IRON, RETICCTPCT in the last 72 hours. Sepsis Labs: No results for input(s): PROCALCITON, LATICACIDVEN in the last 168 hours.  Recent Results (from the past 240 hour(s))  Resp Panel by RT-PCR (Flu A&B, Covid) Nasopharyngeal Swab     Status: None   Collection Time: 12/10/20  6:56 AM   Specimen: Nasopharyngeal Swab; Nasopharyngeal(NP) swabs in vial transport medium  Result Value Ref Range Status   SARS Coronavirus 2 by RT PCR NEGATIVE NEGATIVE Final    Comment: (NOTE) SARS-CoV-2 target nucleic acids are NOT DETECTED.  The SARS-CoV-2 RNA is generally detectable in upper respiratory specimens during the acute phase of infection. The lowest concentration of  SARS-CoV-2 viral copies this assay can detect is 138 copies/mL. A negative result does not preclude SARS-Cov-2 infection and should not be used as the sole basis for treatment or other patient management decisions. A negative result may occur with  improper specimen collection/handling, submission of specimen other than nasopharyngeal swab, presence of viral mutation(s) within the areas targeted by this assay, and inadequate number of viral copies(<138 copies/mL). A negative result must be combined with clinical observations, patient history, and epidemiological information. The expected result is Negative.  Fact Sheet for Patients:  EntrepreneurPulse.com.au  Fact Sheet for Healthcare Providers:  IncredibleEmployment.be  This test is no t yet approved or cleared by the [place] FDA and  has been authorized for detection and/or diagnosis of SARS-CoV-2 by FDA under an Emergency Use Authorization (EUA). This EUA will remain  in effect (meaning this test can be used) for the duration of the COVID-19 declaration under Section 564(b)(1) of the Act, 21 U.S.C.section 360bbb-3(b)(1), unless the authorization is terminated  or revoked sooner.       Influenza A by PCR NEGATIVE NEGATIVE Final   Influenza B by PCR NEGATIVE NEGATIVE Final    Comment: (NOTE) The Xpert Xpress SARS-CoV-2/FLU/RSV plus assay is intended as an aid in the diagnosis of  influenza from Nasopharyngeal swab specimens and should not be used as a sole basis for treatment. Nasal washings and aspirates are unacceptable for Xpert Xpress SARS-CoV-2/FLU/RSV testing.  Fact Sheet for Patients: EntrepreneurPulse.com.au  Fact Sheet for Healthcare Providers: IncredibleEmployment.be  This test is not yet approved or cleared by the [place] FDA and has been authorized for detection and/or diagnosis of SARS-CoV-2 by FDA under an Emergency Use  Authorization (EUA). This EUA will remain in effect (meaning this test can be used) for the duration of the COVID-19 declaration under Section 564(b)(1) of the Act, 21 U.S.C. section 360bbb-3(b)(1), unless the authorization is terminated or revoked.  Performed at [facility], [address], [place], [place] [address]      Radiology Studies: DG Chest 1 View  Result Date: 12/10/2020 CLINICAL DATA:  Pt presents from home via EMS following a fall while trying to get to the restroom. Pt endorses left hip pain - notable shortening on exam. Pt states he hit his head - denies LOC. Hx of dementia EXAM: CHEST  1 VIEW COMPARISON:  08/28/2020 FINDINGS: Coarse interstitial markings, most prominent in the left lower lung. No confluent airspace disease or overt edema. Heart size and mediastinal contours are within normal limits. Aortic Atherosclerosis (ICD10-170.0). CABG marker. No effusion.  No pneumothorax. Sternotomy wires. Lumbar fixation hardware partially visualized. Vertebral endplate spurring at multiple levels in the thoracolumbar spine. IMPRESSION: No acute cardiopulmonary disease. Electronically Signed   By: Lucrezia Europe M.D.   On: 12/10/2020 05:53   CT HEAD WO CONTRAST (5MM)  Result Date: 12/10/2020 CLINICAL DATA:  Blunt trauma post fall EXAM: CT HEAD WITHOUT CONTRAST TECHNIQUE: Contiguous axial images were obtained from the base of the skull through the vertex without intravenous contrast. COMPARISON:  MR 08/09/2020 1 a FINDINGS: Brain: Diffuse parenchymal atrophy. Patchy areas of hypoattenuation in deep and periventricular white matter bilaterally. Negative for acute intracranial hemorrhage, mass lesion, acute infarction, midline shift, or mass-effect. Acute infarct may be inapparent on noncontrast CT. Ventricles and sulci symmetric. Vascular: Atherosclerotic and physiologic intracranial calcifications. Skull: Eccentric sclerotic slightly expansile lesion in the clivus centered to the  left of midline as described on prior studies back to 12/13/2012, nonspecific but possibly chondroid lesion. Negative for fracture or acute lesion. Sinuses/Orbits: Retention cyst or polyp in the sphenoid sinus. No acute findings. Other: None IMPRESSION: 1. Negative for bleed or other acute intracranial process. 2. Atrophy and nonspecific white matter changes. 3. Sclerotic osseous lesion in the clivus as described on prior studies back to 2014. Electronically Signed   By: Lucrezia Europe M.D.   On: 12/10/2020 05:57   CT Cervical Spine Wo Contrast  Result Date: 12/10/2020 CLINICAL DATA:  [place], blunt trauma to head EXAM: CT CERVICAL SPINE WITHOUT CONTRAST TECHNIQUE: Multidetector CT imaging of the cervical spine was performed without intravenous contrast. Multiplanar CT image reconstructions were also generated. COMPARISON:  MR 12/12/2012 FINDINGS: Alignment: Normal Skull base and vertebrae: Slightly expansile mostly sclerotic lesion in the clivus centered to the left of midline, as seen on studies dating back to 2014. No fracture. Soft tissues and spinal canal: Enlarged heterogenous thyroid, incompletely visualized, with rightward displacement and mild narrowing of the trachea at the thoracic inlet. In the setting of significant comorbidities or limited life expectancy, no follow-up recommended (ref: J Am Coll Radiol. 2015 Feb;12(2): 143-50). No prevertebral swelling or fluid. Right calcified carotid bifurcation plaque. No evident canal hematoma. Disc levels: Mild narrowing C5-6 and C6-7 with small anterior and posterior endplate spurs.  Moderate narrowing C7-T1 with endplate spurring. Multilevel facet DJD. Upper chest: Visualized lung apices clear. Other: Suture material in the subcutaneous tissues and nuchal ligament of the posterior cervical spine in the midline C4-C7. IMPRESSION: 1. Negative for fracture or other acute bone abnormality. 2. Cervical spondylitic and postop changes as above 3. Sclerotic osseous lesion  in the clivus, as described on prior studies back to 2014. Electronically Signed   By: Lucrezia Europe M.D.   On: 12/10/2020 06:05   CT PELVIS WO CONTRAST  Result Date: 12/10/2020 CLINICAL DATA:  87 year old male status post fall with left hip pain, left femoral neck fracture suspected on radiographs. EXAM: CT PELVIS WITHOUT CONTRAST TECHNIQUE: Multidetector CT imaging of the pelvis was performed following the standard protocol without intravenous contrast. COMPARISON:  Left hip series 0 455 hours today. CT Abdomen and Pelvis 08/28/2020. FINDINGS: Urinary Tract: Partially visible left renal nephrocalcinosis and exophytic cysts. Stable small right renal lower pole cyst. No hydroureter. Extensive bladder diverticula appear stable. Pelvic phleboliths. Bowel: New stool ball in the rectum, about 6 cm diameter with chronic retained stool in the sigmoid. But visible large and small bowel loops are nondilated. Normal gas-filled appendix on series 3, image 42. Vascular/Lymphatic: Aortoiliac calcified atherosclerosis. Vascular patency is not evaluated in the absence of IV contrast. No lymphadenopathy identified. Reproductive:  Negative. Other:  No pelvic free fluid.  No pelvic sidewall hematoma. Musculoskeletal: Chronic lumbar degeneration, decompression and fusion. Visible lumbar hardware appears stable and intact. Sacrum and SI joints appear stable and intact. Pelvis and proximal right femur appears stable and intact. Transverse fracture subcapital left femoral neck is mildly angulated and impacted (series 3, image 95). Small comminution fragment on series 4, image 69. The intertrochanteric segment remains intact. The left femoral head is normally located. IMPRESSION: 1. Confirmed left femoral neck fracture, subcapital mildly angulated, impacted, and slightly comminuted. 2. No other acute traumatic injury identified in the pelvis. 3. Increased rectal stool, consider mild fecal impaction. Chronic bladder diverticula, left  nephrocalcinosis. 4. Aortic Atherosclerosis (ICD10-I70.0). Electronically Signed   By: Genevie Ann M.D.   On: 12/10/2020 07:17   DG HIP UNILAT W OR W/O PELVIS 2-3 VIEWS LEFT  Result Date: 12/10/2020 CLINICAL DATA:  Status post left total hip replacement. EXAM: DG HIP (WITH OR WITHOUT PELVIS) 2-3V LEFT COMPARISON:  Left hip radiographs earlier today FINDINGS: Sequelae of interval left hip arthroplasty are identified. No acute fracture or dislocation is identified. Postoperative gas is noted in the surrounding soft tissues, and skin staples are in place. Mild right hip osteoarthrosis is noted. IMPRESSION: Interval left hip arthroplasty. Electronically Signed   By: Logan Bores M.D.   On: 12/10/2020 18:50   DG Hip Unilat W or Wo Pelvis 2-3 Views Left  Result Date: 12/10/2020 CLINICAL DATA:  [place], left hip pain EXAM: DG HIP (WITH OR WITHOUT PELVIS) 2-3V LEFT COMPARISON:  07/18/2020 FINDINGS: Probable impacted left femoral neck fracture. No dislocation. Bony pelvis intact. Fixation hardware in the lower lumbar spine partially visualized. Degenerative disc disease L5-S1. Bilateral pelvic phleboliths. Patchy arterial calcifications. IMPRESSION: Probable impacted left femoral neck fracture, new since previous. Electronically Signed   By: Lucrezia Europe M.D.   On: 12/10/2020 05:52    Scheduled Meds:  Chlorhexidine Gluconate Cloth  6 each Topical Daily   docusate sodium  100 mg Oral BID   donepezil  10 mg Oral QHS   enoxaparin (LOVENOX) injection  40 mg Subcutaneous Q24H   feeding supplement  237 mL Oral BID BM  gabapentin  300 mg Oral QID   losartan  25 mg Oral Daily   pantoprazole  40 mg Oral Daily   tamsulosin  0.4 mg Oral QPM   Continuous Infusions:  sodium chloride Stopped (12/11/20 0535)   methocarbamol (ROBAXIN) IV       LOS: 1 day   Time spent: 40 minutes. More than 50% of the time was spent in counseling/coordination of care  Lorella Nimrod, MD [facility]  If 7PM-7AM, please contact  night-coverage [email or web address]  12/11/2020, 2:15 PM   This record has been created using Systems analyst. Errors have been sought and corrected,but may not always be located. Such creation errors do not reflect on the standard of care.

## 2020-12-11 NOTE — Anesthesia Postprocedure Evaluation (Signed)
Anesthesia Post Note  Patient: Martyn Giovan Pinsky.  Procedure(s) Performed: ARTHROPLASTY BIPOLAR HIP (HEMIARTHROPLASTY) (Left: Hip)  Patient location during evaluation: Nursing Unit Anesthesia Type: Spinal Level of consciousness: oriented and awake and alert Pain management: pain level controlled Vital Signs Assessment: post-procedure vital signs reviewed and stable Respiratory status: spontaneous breathing and respiratory function stable Cardiovascular status: blood pressure returned to baseline and stable Postop Assessment: no headache, no backache, no apparent nausea or vomiting and patient able to bend at knees Anesthetic complications: no   No notable events documented.   Last Vitals:  Vitals:   12/11/20 0515 12/11/20 0839  BP: (!) 149/75 128/71  Pulse: 97 87  Resp: 16 16  Temp: 36.8 C 37.2 C  SpO2: 94% 95%    Last Pain:  Vitals:   12/11/20 0839  TempSrc: Oral  PainSc:                  Alison Stalling

## 2020-12-11 NOTE — Evaluation (Signed)
Physical Therapy Evaluation Patient Details Name: Jason Cross. MRN: [id number] DOB: 1928-09-01 Today's Date: 12/11/2020  History of Present Illness  Jason W Latrel Szymczak. is a 87 y.o. male with medical history significant for dementia, Parkinson's disease, coronary artery disease status post CABG, peripheral neuropathy, GERD who presents to the ER via EMS for evaluation following a fall at home.   Clinical Impression  Pt admitted with above diagnosis. Pt A&O x2 to person and place. Agreeable to participate in therapy. Pt poor historian due to baseline Parkinson Disease and dementia. 15 minute phone call after session with pt's wife, Jackelyn Poling for PLOF, assistance needed at home, and home lay out. Prior to hospitalization pt was a household ambulator with RW needing assistance from spouse with ADL's/IADL's with minA to [facility] for transfers/bed mobility. Hx of repetitive falls but since July, has only had one fall which has led to current admission. Pt today relies on modA with HOB elevated and max multimodal cues for motor planning to reach EoB. X2 trials STS to RW still relying on Max multimodal cues for safe hand placement and to stand upright due to significant posterior lean. Heavy reliance on TC's on pelvis and VC's to maintain upright. On second STS pt able to pivot/side step transfer to recliner with festinating gait and freezing of gait relying on max TC's on RW and VC's to improve motor planning and modA with stepping to safely reach recliner. With transfers and  standing pt displays > 75% of weightbearing on RLE to off load LLE which I believe is due to pain/discomfort although pt does not endorse pain, but reports "tenderness and soreness". LLE also in significant hip IR and adduction placing genu valgus forces at  L knee with OOB mobility. Once sitting in recliner, pt educated on importance of neutral hip alignment which is attainable with PROM, but pt unable to hold in neutral position. Pt  will need STR due to poor safety awareness, motor planning, poor use of AD, and LE strength limitations with all functional mobility prior to D/c'ing back to home environment as pt is at an increased significant risk of falls with current level of mobility. Pt currently with functional limitations due to the deficits listed below (see PT Problem List). Pt will benefit from skilled PT to increase their independence and safety with mobility to allow discharge to the venue listed below.     Recommendations for follow up therapy are one component of a multi-disciplinary discharge planning process, led by the attending physician.  Recommendations may be updated based on patient status, additional functional criteria and insurance authorization.  Follow Up Recommendations SNF;Supervision/Assistance - 24 hour    Equipment Recommendations  Other (comment) (tbd by next venue of care)    Recommendations for Other Services OT consult     Precautions / Restrictions Precautions Precautions: Fall Restrictions Weight Bearing Restrictions: No      Mobility  Bed Mobility Overal bed mobility: Needs Assistance Bed Mobility: Supine to Sit     Supine to sit: Mod assist;HOB elevated     General bed mobility comments: Multimodal cuing and LLE management needed Patient Response: Cooperative;Flat affect;Impulsive  Transfers Overall transfer level: Needs assistance Equipment used: Rolling walker (2 wheeled) Transfers: Sit to/from [facility] Sit to Stand: Mod assist;From elevated surface Stand pivot transfers: Mod assist;From elevated surface       General transfer comment: Displays significant post lean. Relies on Max multimodal cues to improve upright posture over BOS so pt doesnt  fall backwards. Difficulty motor planning due to parksinson's. Significant weight shift onto RLE to off load LLE from hip pain.  Ambulation/Gait Ambulation/Gait assistance: Mod assist Gait Distance  (Feet): 2 Feet Assistive device: Rolling walker (2 wheeled) Gait Pattern/deviations: Step-to pattern;Trunk flexed;Narrow base of support;Festinating;Antalgic     General Gait Details: Difficulty keeping RW close to BOS. Keeps far in front despite max cuing. Difficulty coordinating side steps and turning to sit in recliner without max multimodal cuing  Stairs            Wheelchair Mobility    Modified Rankin (Stroke Patients Only)       Balance Overall balance assessment: Needs assistance Sitting-balance support: Bilateral upper extremity supported;Feet supported Sitting balance-Leahy Scale: Fair Sitting balance - Comments: Able to maintain static sitting balance once assisted to sitting. Postural control: Posterior lean Standing balance support: Bilateral upper extremity supported;During functional activity Standing balance-Leahy Scale: Poor Standing balance comment: Significant reliance on RW. Poor use of RW, significant weight shift onto RLE to offload LLE due to pain.                             Pertinent Vitals/Pain Pain Assessment: Faces Faces Pain Scale: Hurts a little bit Pain Location: L hip Pain Intervention(s): Limited activity within patient's tolerance;Monitored during session;Repositioned    Home Living Family/patient expects to be discharged to:: Private residence Living Arrangements: Spouse/significant other Available Help at Discharge: Family;Available 24 hours/day Type of Home: House Home Access: Stairs to enter Entrance Stairs-Rails: Chemical engineer of Steps: 2 Home Layout: One level Home Equipment: Cane - single point;Bedside commode;Walker - 2 wheels Additional Comments: Denies any falls since previous hospitalization besides the 1 fall leading to current admission. Reports needing additional help at home due to functional decline    Prior Function Level of Independence: Needs assistance   Gait / Transfers  Assistance Needed: Household amb with RW  ADL's / Homemaking Assistance Needed: Spouse reports assisting in safely transferring pt to toilet and shower. ABle to bath himself but needs assistance with toileting clean up with BM, assist/set up with dressing ADL's        Hand Dominance        Extremity/Trunk Assessment   Upper Extremity Assessment Upper Extremity Assessment: Generalized weakness    Lower Extremity Assessment Lower Extremity Assessment: Generalized weakness;LLE deficits/detail;RLE deficits/detail RLE Sensation: history of peripheral neuropathy LLE Sensation: history of peripheral neuropathy    Cervical / Trunk Assessment Cervical / Trunk Assessment: Kyphotic  Communication   Communication: HOH  Cognition Arousal/Alertness: Awake/alert Behavior During Therapy: Flat affect Overall Cognitive Status: History of cognitive impairments - at baseline                                 General Comments: Hx of Parkinson's and Dementia      General Comments General comments (skin integrity, edema, etc.): Significant L hip IR and adduction in long sitting and standing. ABle to reach neutral hip positioning but pt having dififculty maintaining.    Exercises Other Exercises Other Exercises: Role of PT in acute setting, d/c recs, weightbearing precautions   Assessment/Plan    PT Assessment Patient needs continued PT services  PT Problem List Decreased strength;Pain;Decreased coordination;Decreased range of motion;Decreased cognition;Impaired sensation;Decreased activity tolerance;Decreased knowledge of use of DME;Decreased balance;Decreased safety awareness;Decreased mobility;Decreased knowledge of precautions       PT Treatment Interventions  DME instruction;Therapeutic exercise;Gait training;Balance training;Stair training;Neuromuscular re-education;Functional mobility training;Therapeutic activities;Patient/family education    PT Goals (Current goals can  be found in the Care Plan section)  Acute Rehab PT Goals PT Goal Formulation: Patient unable to participate in goal setting    Frequency 7X/week   Barriers to discharge        Co-evaluation               AM-PAC PT "6 Clicks" Mobility  Outcome Measure Help needed turning from your back to your side while in a flat bed without using bedrails?: A Lot Help needed moving from lying on your back to sitting on the side of a flat bed without using bedrails?: A Lot Help needed moving to and from a bed to a chair (including a wheelchair)?: A Lot Help needed standing up from a chair using your arms (e.g., wheelchair or bedside chair)?: A Lot Help needed to walk in hospital room?: A Lot Help needed climbing 3-5 steps with a railing? : Total 6 Click Score: 11    End of Session Equipment Utilized During Treatment: Gait belt Activity Tolerance: Treatment limited secondary to medical complications (Comment) Patient left: in chair;with chair alarm set Nurse Communication: Mobility status PT Visit Diagnosis: Unsteadiness on feet (R26.81);History of falling (Z91.81);Other abnormalities of gait and mobility (R26.89);Repeated falls (R29.6);Muscle weakness (generalized) (M62.81);Pain Pain - Right/Left: Left Pain - part of body: Hip    Time: 9150-5697 PT Time Calculation (min) (ACUTE ONLY): 37 min   Charges:   PT Evaluation $PT Eval Moderate Complexity: 1 Mod PT Treatments $Therapeutic Activity: 8-22 mins       Elijha Dedman M. Fairly IV, PT, DPT Physical Therapist- [facility]  12/11/2020, 10:28 AM

## 2020-12-11 NOTE — TOC Progression Note (Signed)
Transition of Care (TOC) - Progression Note    Patient Details  Name: Jason Cross. MRN: [id number] Date of Birth: [birth year]  Transition of Care [facility]) CM/SW [place], RN Phone Number: 12/11/2020, 12:36 PM  Clinical Narrative:   Met with the patient and his wife in the room  He lives at home with his wife The plan is to go to STR, SNF for a bit prior to going back home The would like to go to [facility] if possible, I sent the bedsearch and will review the bed offers once obtained I was unable to get into [facility] to obtain PASSR Reached out to [facility] asking to review for bed offer Awaiting a call back         Expected Discharge Plan and Services                                                 Social Determinants of Health (SDOH) Interventions    Readmission Risk Interventions No flowsheet data found.

## 2020-12-11 NOTE — TOC Progression Note (Signed)
Transition of Care (TOC) - Progression Note    Patient Details  Name: Jason Cross. MRN: [id number] Date of Birth: [birth year]  Transition of Care [facility]) CM/SW [place], RN Phone Number: 12/11/2020, 4:02 PM  Clinical Narrative:      Was able to log into [place] MUST and obtain the PASSR number [id number]      Expected Discharge Plan and Services                                                 Social Determinants of Health (SDOH) Interventions    Readmission Risk Interventions No flowsheet data found.

## 2020-12-11 NOTE — NC FL2 (Addendum)
[place] LEVEL OF CARE SCREENING TOOL     IDENTIFICATION  Patient Name: Lang Zingg. Birthdate: [birth year] Sex: male Admission Date (Current Location): 12/10/2020  [facility] and [place] Number:  Engineering geologist and Address:  [facility], [address], [place], [place] [address]      Provider Number: [id number]  Attending Physician Name and Address:  Lorella Nimrod, MD  Relative Name and Phone Number:  Jackelyn Poling [phone]    Current Level of Care: Hospital Recommended Level of Care: [place] Prior Approval Number:    Date Approved/Denied:   PASRR Number:  [id number]  Discharge Plan: SNF    Current Diagnoses: Patient Active Problem List   Diagnosis Date Noted   Closed displaced fracture of left femoral neck ([place]) 12/10/2020   Coronary artery disease due to lipid rich plaque    Benign prostatic hyperplasia with urinary frequency    Weakness    At high risk for falls 06/28/2020   Senile purpura ([place]) 06/22/2020   Aortic atherosclerosis ([place]) 06/05/2020   Scoliosis of thoracolumbar spine 06/03/2020   History of 2019 novel coronavirus disease (COVID-19) 03/13/2020   CKD (chronic kidney disease) stage 3, GFR 30-59 ml/min ([place]) 11/26/2019   Difficulty walking 08/04/2017   Expressive aphasia 08/04/2017   Parkinson disease ([place]) 08/04/2017   Advanced care planning/counseling discussion 07/21/2016   GERD (gastroesophageal reflux disease) 12/30/2015   Neuropathy 07/17/2015   Dementia due to Parkinson's disease without behavioral disturbance ([place]) 07/17/2015   Orthostatic hypotension 07/17/2015   Hypertension 07/17/2015   Coronary artery disease involving coronary bypass graft of native heart without angina pectoris 07/17/2015   Urinary retention with incomplete bladder emptying 03/20/2013   Constipation due to slow transit 03/20/2013   Lumbar spondylolysis 03/14/2013   Lumbar scoliosis 03/10/2013     Orientation RESPIRATION BLADDER Height & Weight     Self, Time  Normal External catheter, Continent Weight: 77.1 kg Height:  5\' 9"  (175.3 cm)  BEHAVIORAL SYMPTOMS/MOOD NEUROLOGICAL BOWEL NUTRITION STATUS      Continent Diet (regular)  AMBULATORY STATUS COMMUNICATION OF NEEDS Skin   Extensive Assist Verbally Normal, Surgical wounds                       Personal Care Assistance Level of Assistance  (P) Bathing, Feeding, Dressing Bathing Assistance: Limited assistance Feeding assistance: Limited assistance Dressing Assistance: Limited assistance     Functional Limitations Info             SPECIAL CARE FACTORS FREQUENCY  PT (By licensed PT), OT (By licensed OT)     PT Frequency: 5 times per week OT Frequency: 5 times per week            Contractures Contractures Info: Not present    Additional Factors Info  Code Status, Allergies Code Status Info: full code Allergies Info: Medical Contrastm Codeine           Current Medications (12/11/2020):  This is the current hospital active medication list Current Facility-Administered Medications  Medication Dose Route Frequency Provider Last Rate Last Admin   0.9 %  sodium chloride infusion   Intravenous Continuous Poggi, Marshall Cork, MD   Stopping Infusion hung by another clincian at 12/11/20 0535   acetaminophen (TYLENOL) tablet 325-650 mg  325-650 mg Oral Q6H PRN Poggi, Marshall Cork, MD       bisacodyl (DULCOLAX) suppository 10 mg  10 mg Rectal Daily PRN Poggi, Marshall Cork, MD  Chlorhexidine Gluconate Cloth 2 % PADS 6 each  6 each Topical Daily Poggi, Marshall Cork, MD   6 each at 12/11/20 0834   diphenhydrAMINE (BENADRYL) 12.5 MG/5ML elixir 12.5-25 mg  12.5-25 mg Oral Q4H PRN Poggi, Marshall Cork, MD       docusate sodium (COLACE) capsule 100 mg  100 mg Oral BID Poggi, Marshall Cork, MD   100 mg at 12/11/20 0830   donepezil (ARICEPT) tablet 10 mg  10 mg Oral QHS Poggi, Marshall Cork, MD   10 mg at 12/10/20 2133   enoxaparin (LOVENOX) injection 40  mg  40 mg Subcutaneous Q24H Poggi, Marshall Cork, MD   40 mg at 12/11/20 0830   feeding supplement (ENSURE ENLIVE / ENSURE PLUS) liquid 237 mL  237 mL Oral BID BM Lorella Nimrod, MD   237 mL at 12/11/20 0834   gabapentin (NEURONTIN) capsule 300 mg  300 mg Oral QID Corky Mull, MD   300 mg at 12/11/20 0830   HYDROcodone-acetaminophen (NORCO/VICODIN) 5-325 MG per tablet 1-2 tablet  1-2 tablet Oral Q6H PRN Poggi, Marshall Cork, MD       losartan (COZAAR) tablet 25 mg  25 mg Oral Daily Poggi, Marshall Cork, MD   25 mg at 12/11/20 0830   magnesium hydroxide (MILK OF MAGNESIA) suspension 30 mL  30 mL Oral Daily PRN Poggi, Marshall Cork, MD       melatonin tablet 2.5 mg  2.5 mg Oral QHS PRN Poggi, Marshall Cork, MD       methocarbamol (ROBAXIN) tablet 500 mg  500 mg Oral Q6H PRN Poggi, Marshall Cork, MD       Or   methocarbamol (ROBAXIN) 500 mg in dextrose 5 % 50 mL IVPB  500 mg Intravenous Q6H PRN Poggi, Marshall Cork, MD       metoCLOPramide (REGLAN) tablet 5-10 mg  5-10 mg Oral Q8H PRN Poggi, Marshall Cork, MD       Or   metoCLOPramide (REGLAN) injection 5-10 mg  5-10 mg Intravenous Q8H PRN Poggi, Marshall Cork, MD       morphine 2 MG/ML injection 0.5 mg  0.5 mg Intravenous Q2H PRN Poggi, Marshall Cork, MD       morphine 2 MG/ML injection 0.5-1 mg  0.5-1 mg Intravenous Q2H PRN Poggi, Marshall Cork, MD       ondansetron (ZOFRAN) tablet 4 mg  4 mg Oral Q6H PRN Poggi, Marshall Cork, MD       Or   ondansetron (ZOFRAN) injection 4 mg  4 mg Intravenous Q6H PRN Poggi, Marshall Cork, MD       pantoprazole (PROTONIX) EC tablet 40 mg  40 mg Oral Daily Poggi, Marshall Cork, MD   40 mg at 12/11/20 0830   sodium phosphate (FLEET) 7-19 GM/118ML enema 1 enema  1 enema Rectal Once PRN Poggi, Marshall Cork, MD       tamsulosin (FLOMAX) capsule 0.4 mg  0.4 mg Oral QPM Poggi, Marshall Cork, MD         Discharge Medications: Please see discharge summary for a list of discharge medications.  Relevant Imaging Results:  Relevant Lab Results:   Additional Information SS# [id number]  Conception Oms, RN

## 2020-12-12 DIAGNOSIS — S72002A Fracture of unspecified part of neck of left femur, initial encounter for closed fracture: Secondary | ICD-10-CM | POA: Diagnosis not present

## 2020-12-12 LAB — BASIC METABOLIC PANEL
Anion gap: 6 (ref 5–15)
BUN: 31 mg/dL — ABNORMAL HIGH (ref 8–23)
CO2: 24 mmol/L (ref 22–32)
Calcium: 8.7 mg/dL — ABNORMAL LOW (ref 8.9–10.3)
Chloride: 107 mmol/L (ref 98–111)
Creatinine, Ser: 1.57 mg/dL — ABNORMAL HIGH (ref 0.61–1.24)
GFR, Estimated: 42 mL/min — ABNORMAL LOW (ref >60)
Glucose, Bld: 98 mg/dL (ref 70–99)
Potassium: 4.2 mmol/L (ref 3.5–5.1)
Sodium: 137 mmol/L (ref 135–145)

## 2020-12-12 LAB — URINALYSIS, COMPLETE (UACMP) WITH MICROSCOPIC
Bacteria, UA: NONE SEEN
Bilirubin Urine: NEGATIVE
Glucose, UA: NEGATIVE mg/dL
Hgb urine dipstick: NEGATIVE
Ketones, ur: 5 mg/dL — AB
Nitrite: NEGATIVE
Protein, ur: 30 mg/dL — AB
Specific Gravity, Urine: 1.02 (ref 1.005–1.030)
pH: 5 (ref 5.0–8.0)

## 2020-12-12 LAB — CBC
HCT: 35.6 % — ABNORMAL LOW (ref 39.0–52.0)
Hemoglobin: 12 g/dL — ABNORMAL LOW (ref 13.0–17.0)
MCH: 29.4 pg (ref 26.0–34.0)
MCHC: 33.7 g/dL (ref 30.0–36.0)
MCV: 87.3 fL (ref 80.0–100.0)
Platelets: 175 10*3/uL (ref 150–400)
RBC: 4.08 MIL/uL — ABNORMAL LOW (ref 4.22–5.81)
RDW: 15.4 % (ref 11.5–15.5)
WBC: 10.3 10*3/uL (ref 4.0–10.5)
nRBC: 0 % (ref 0.0–0.2)

## 2020-12-12 LAB — SURGICAL PATHOLOGY

## 2020-12-12 LAB — GLUCOSE, CAPILLARY: Glucose-Capillary: 125 mg/dL — ABNORMAL HIGH (ref 70–99)

## 2020-12-12 MED ORDER — SODIUM CHLORIDE 0.9 % IV SOLN
1.0000 g | INTRAVENOUS | Status: DC
  Administered 2020-12-12 – 2020-12-13 (×2): 1 g via INTRAVENOUS
  Filled 2020-12-12 (×2): qty 10

## 2020-12-12 NOTE — Progress Notes (Signed)
Physical Therapy Treatment Patient Details Name: Jason Cross. MRN: [id number] DOB: Aug 23, 1928 Today's Date: 12/12/2020   History of Present Illness Jason W [place]. is a 87 y.o. male with medical history significant for dementia, Parkinson's disease, coronary artery disease status post CABG, peripheral neuropathy, GERD who presents to the ER via EMS for evaluation following a fall at home.    PT Comments    Pt received laying supine in bed upon entry and agreeable to PT treatment. Pt with PMH of dementia and unable to recall any posterior hip precautions s/p L hemiarthoplasty. Pt demonstrating increased difficulty with maintaining seated position at EOB requiring modA likely due to pain in the L hip. Pt requiring maxA + 2 to stand with RW and unable to reach full hip and knee extension nor anteriorly weight shift.. Pt showing difficulty weight shifting towards L LE with tactile and verbal cues. Pt assisted back to bed with maxA + 2. Pt repositioned at end of session with pillows between his legs to improve adherence to hip precautions. Recommending SNF at discharge to improve overall function and to decrease caregiver burden.    Recommendations for follow up therapy are one component of a multi-disciplinary discharge planning process, led by the attending physician.  Recommendations may be updated based on patient status, additional functional criteria and insurance authorization.  Follow Up Recommendations  SNF;Supervision/Assistance - 24 hour     Equipment Recommendations  Other (comment) (TBD next venue of care)    Recommendations for Other Services       Precautions / Restrictions Precautions Precautions: Fall;Posterior Hip Restrictions Weight Bearing Restrictions: No     Mobility  Bed Mobility Overal bed mobility: Needs Assistance Bed Mobility: Supine to Sit;Sit to Supine     Supine to sit: Max assist;+2 for physical assistance;HOB elevated Sit to supine: Max  assist;+2 for physical assistance   General bed mobility comments: MaxA + 2 due to posterior lean and guarding of the L hip    Transfers Overall transfer level: Needs assistance Equipment used: Rolling walker (2 wheeled) Transfers: Sit to/from Stand Sit to Stand: Max assist;+2 safety/equipment         General transfer comment: did not attempt, per nurse  Ambulation/Gait             General Gait Details: Deferred at this time due to signficiant posterior lean and unable to weight shift towards L LE   Stairs             Wheelchair Mobility    Modified Rankin (Stroke Patients Only)       Balance Overall balance assessment: Needs assistance Sitting-balance support: Bilateral upper extremity supported;Feet supported Sitting balance-Leahy Scale: Poor Sitting balance - Comments: Requiring moderate assistance to maintain seated position. Demonstrating posterior lean   Standing balance support: Bilateral upper extremity supported;During functional activity Standing balance-Leahy Scale: Zero Standing balance comment: Requiring maxA +2 for static standing balance. Unable to weight shift anteriorly to be able to take steps                            Cognition Arousal/Alertness: Awake/alert Behavior During Therapy: Agitated;Anxious Overall Cognitive Status: History of cognitive impairments - at baseline                                 General Comments: Aware that he is at "a hospital." Can ID  year as 2022, unable to state month.      Exercises Total Joint Exercises Ankle Circles/Pumps: 10 reps;AROM;Both Heel Slides: AAROM;Left;20 reps Hip ABduction/ADduction: AAROM;Left;20 reps Other Exercises Other Exercises: Educ with spouse re: DC recs, POC    General Comments        Pertinent Vitals/Pain Pain Assessment: Faces Faces Pain Scale: Hurts even more (with movement) Pain Location: L hip Pain Descriptors / Indicators:  Discomfort;Grimacing;Guarding;Operative site guarding Pain Intervention(s): Limited activity within patient's tolerance;Repositioned;Monitored during session;Relaxation    Home Living                      Prior Function            PT Goals (current goals can now be found in the care plan section) Acute Rehab PT Goals Patient Stated Goal: to go to rehab PT Goal Formulation: Patient unable to participate in goal setting Progress towards PT goals: Progressing toward goals    Frequency    7X/week      PT Plan Current plan remains appropriate    Co-evaluation              AM-PAC PT "6 Clicks" Mobility   Outcome Measure  Help needed turning from your back to your side while in a flat bed without using bedrails?: Total Help needed moving from lying on your back to sitting on the side of a flat bed without using bedrails?: Total Help needed moving to and from a bed to a chair (including a wheelchair)?: Total Help needed standing up from a chair using your arms (e.g., wheelchair or bedside chair)?: A Lot Help needed to walk in hospital room?: A Lot Help needed climbing 3-5 steps with a railing? : Total 6 Click Score: 8    End of Session Equipment Utilized During Treatment: Gait belt Activity Tolerance: Patient limited by pain Patient left: in bed;with bed alarm set;with family/visitor present;with call bell/phone within reach Nurse Communication: Mobility status PT Visit Diagnosis: Unsteadiness on feet (R26.81);History of falling (Z91.81);Other abnormalities of gait and mobility (R26.89);Repeated falls (R29.6);Muscle weakness (generalized) (M62.81);Pain Pain - Right/Left: Left Pain - part of body: Hip     Time: 5361-4431 PT Time Calculation (min) (ACUTE ONLY): 24 min  Charges:  $Therapeutic Exercise: 8-22 mins $Therapeutic Activity: 8-22 mins                     Andrey Campanile, SPT    Andrey Campanile 12/12/2020, 1:11 PM

## 2020-12-12 NOTE — Progress Notes (Signed)
PROGRESS NOTE    Jason Cross.  AUQ:[id number] DOB: 1928-06-30 DOA: 12/10/2020 PCP: Venita Lick, NP   Brief Narrative: Taken from H&P. Jason Cross. is a 87 y.o. male with medical history significant for dementia, Parkinson's disease, coronary artery disease status post CABG, peripheral neuropathy, GERD who presents to the ER via EMS for evaluation following a fall at home. At baseline patient ambulates with a rolling walker.  Patient with progressive decline and frequent falls for the past 73-month. Found to have impacted left femoral neck fracture, no other acute abnormality noted. Taken to the OR and underwent left bipolar hemiarthroplasty with orthopedic surgery on 12/10/2020. PT is recommending SNF-TOC started the work-up  10/20: Patient developed fever overnight, up to 101.4.  History of UTI with pansensitive E. coli during prior admission with Foley catheter. Unable to void after removing catheter, Foley was replaced.  Patient is high risk for deterioration and death based on age and prior comorbidities with recent femoral neck fracture.  Subjective: Patient was feeling more weak and having some hip pain when seen today.  Wife at bedside.  Per wife he is more confused and unable to participate with PT because of worsening weakness.  She was also concerned about his fever, stating that he developed UTI during prior hospitalization with Foley catheter.  Assessment & Plan:   Principal Problem:   Closed displaced fracture of left femoral neck (HCC) Active Problems:   Dementia due to Parkinson's disease without behavioral disturbance (HCC)   Hypertension   Coronary artery disease involving coronary bypass graft of native heart without angina pectoris   CKD (chronic kidney disease) stage 3, GFR 30-59 ml/min (HCC)   At high risk for falls   Malnutrition of moderate degree  Left femoral neck fracture.  Secondary to mechanical fall s/p bipolar  hemiarthroplasty. Pain seems well controlled under current regimen.  History of frequent falls and progressively worsening weakness with history of underlying Parkinson's disease and dementia. PT is recommending SNF. -Continue with pain management and PT.  Fever.  Patient unable to explain any urinary symptoms.  UA with some leukocytosis. Can be postoperative SARS response. -Urine and blood cultures ordered. -Start him on ceftriaxone based on prior urine culture results. -Monitor fever curve  History of Parkinson's disease and dementia.  Not on any medications for Parkinson's at home. -Continue home dose of Aricept  CKD stage IIIb.  Creatinine seems stable. -Monitor renal function. -Avoid nephrotoxins.  Hypertension.  Blood pressure within goal. -Continue home Cozaar  BPH. -Continue home Flomax  Objective: Vitals:   12/12/20 0526 12/12/20 0739 12/12/20 1300 12/12/20 1458  BP: 140/87 136/79 (!) 151/86 (!) 145/79  Pulse: 73 (!) 43 (!) 56 (!) 102  Resp: 17 14 14 14   Temp: 98.2 F (36.8 C) 100 F (37.8 C) 99 F (37.2 C) 98.5 F (36.9 C)  TempSrc: Oral     SpO2: 95% 95% 96% 100%  Weight:      Height:        Intake/Output Summary (Last 24 hours) at 12/12/2020 1501 Last data filed at 12/12/2020 1419 Gross per 24 hour  Intake 0 ml  Output 1100 ml  Net -1100 ml    Filed Weights   12/10/20 0445  Weight: 77.1 kg    Examination:  General.  Lethargic elderly man, in no acute distress. Pulmonary.  Lungs clear bilaterally, normal respiratory effort. CV.  Regular rate and rhythm, no JVD, rub or murmur. Abdomen.  Soft, nontender, nondistended, BS  positive. CNS.  Alert and oriented to self today.  No focal neurologic deficit. Extremities.  No edema, no cyanosis, pulses intact and symmetrical. Psychiatry.  Judgment and insight appears normal.   DVT prophylaxis: Lovenox Code Status: Full Family Communication: Discussed with wife at bedside Disposition Plan:  Status is:  Inpatient  Remains inpatient appropriate because: Of severity of illness  Level of care: Med-Surg  All the records are reviewed and case discussed with Care Management/Social Worker. Management plans discussed with the patient, nursing and they are in agreement.  Consultants:  Orthopedic surgery  Procedures:  Antimicrobials:  Ceftriaxone.  Data Reviewed: I have personally reviewed following labs and imaging studies  CBC: Recent Labs  Lab 12/10/20 0437 12/11/20 0347 12/12/20 0417  WBC 6.7 11.6* 10.3  NEUTROABS 3.7  --   --   HGB 12.8* 12.3* 12.0*  HCT 39.7 36.4* 35.6*  MCV 89.0 87.3 87.3  PLT 231 187 542    Basic Metabolic Panel: Recent Labs  Lab 12/10/20 0437 12/11/20 0347 12/12/20 0417  NA 142 137 137  K 4.0 4.4 4.2  CL 107 107 107  CO2 27 23 24   GLUCOSE 99 124* 98  BUN 25* 23 31*  CREATININE 1.58* 1.50* 1.57*  CALCIUM 9.1 8.6* 8.7*    GFR: Estimated Creatinine Clearance: 31.3 mL/min (A) (by C-G formula based on SCr of 1.57 mg/dL (H)). Liver Function Tests: No results for input(s): AST, ALT, ALKPHOS, BILITOT, PROT, ALBUMIN in the last 168 hours. No results for input(s): LIPASE, AMYLASE in the last 168 hours. No results for input(s): AMMONIA in the last 168 hours. Coagulation Profile: Recent Labs  Lab 12/10/20 0437  INR 1.1    Cardiac Enzymes: No results for input(s): CKTOTAL, CKMB, CKMBINDEX, TROPONINI in the last 168 hours. BNP (last 3 results) No results for input(s): PROBNP in the last 8760 hours. HbA1C: No results for input(s): HGBA1C in the last 72 hours. CBG: No results for input(s): GLUCAP in the last 168 hours. Lipid Profile: No results for input(s): CHOL, HDL, LDLCALC, TRIG, CHOLHDL, LDLDIRECT in the last 72 hours. Thyroid Function Tests: No results for input(s): TSH, T4TOTAL, FREET4, T3FREE, THYROIDAB in the last 72 hours. Anemia Panel: No results for input(s): VITAMINB12, FOLATE, FERRITIN, TIBC, IRON, RETICCTPCT in the last 72  hours. Sepsis Labs: No results for input(s): PROCALCITON, LATICACIDVEN in the last 168 hours.  Recent Results (from the past 240 hour(s))  Resp Panel by RT-PCR (Flu A&B, Covid) Nasopharyngeal Swab     Status: None   Collection Time: 12/10/20  6:56 AM   Specimen: Nasopharyngeal Swab; Nasopharyngeal(NP) swabs in vial transport medium  Result Value Ref Range Status   SARS Coronavirus 2 by RT PCR NEGATIVE NEGATIVE Final    Comment: (NOTE) SARS-CoV-2 target nucleic acids are NOT DETECTED.  The SARS-CoV-2 RNA is generally detectable in upper respiratory specimens during the acute phase of infection. The lowest concentration of SARS-CoV-2 viral copies this assay can detect is 138 copies/mL. A negative result does not preclude SARS-Cov-2 infection and should not be used as the sole basis for treatment or other patient management decisions. A negative result may occur with  improper specimen collection/handling, submission of specimen other than nasopharyngeal swab, presence of viral mutation(s) within the areas targeted by this assay, and inadequate number of viral copies(<138 copies/mL). A negative result must be combined with clinical observations, patient history, and epidemiological information. The expected result is Negative.  Fact Sheet for Patients:  EntrepreneurPulse.com.au  Fact Sheet for Healthcare Providers:  IncredibleEmployment.be  This test is no t yet approved or cleared by the [place] and  has been authorized for detection and/or diagnosis of SARS-CoV-2 by FDA under an Emergency Use Authorization (EUA). This EUA will remain  in effect (meaning this test can be used) for the duration of the COVID-19 declaration under Section 564(b)(1) of the Act, 21 U.S.C.section 360bbb-3(b)(1), unless the authorization is terminated  or revoked sooner.       Influenza A by PCR NEGATIVE NEGATIVE Final   Influenza B by PCR NEGATIVE  NEGATIVE Final    Comment: (NOTE) The Xpert Xpress SARS-CoV-2/FLU/RSV plus assay is intended as an aid in the diagnosis of influenza from Nasopharyngeal swab specimens and should not be used as a sole basis for treatment. Nasal washings and aspirates are unacceptable for Xpert Xpress SARS-CoV-2/FLU/RSV testing.  Fact Sheet for Patients: EntrepreneurPulse.com.au  Fact Sheet for Healthcare Providers: IncredibleEmployment.be  This test is not yet approved or cleared by the [place] FDA and has been authorized for detection and/or diagnosis of SARS-CoV-2 by FDA under an Emergency Use Authorization (EUA). This EUA will remain in effect (meaning this test can be used) for the duration of the COVID-19 declaration under Section 564(b)(1) of the Act, 21 U.S.C. section 360bbb-3(b)(1), unless the authorization is terminated or revoked.  Performed at [facility], [address] [place], [place] [address]       Radiology Studies: DG HIP [facility] OR W/O PELVIS 2-3 VIEWS LEFT  Result Date: 12/10/2020 CLINICAL DATA:  Status post left total hip replacement. EXAM: DG HIP (WITH OR WITHOUT PELVIS) 2-3V LEFT COMPARISON:  Left hip radiographs earlier today FINDINGS: Sequelae of interval left hip arthroplasty are identified. No acute fracture or dislocation is identified. Postoperative gas is noted in the surrounding soft tissues, and skin staples are in place. Mild right hip osteoarthrosis is noted. IMPRESSION: Interval left hip arthroplasty. Electronically Signed   By: Logan Bores M.D.   On: 12/10/2020 18:50    Scheduled Meds:  Chlorhexidine Gluconate Cloth  6 each Topical Daily   docusate sodium  100 mg Oral BID   donepezil  10 mg Oral QHS   enoxaparin (LOVENOX) injection  40 mg Subcutaneous Q24H   feeding supplement  237 mL Oral BID BM   gabapentin  300 mg Oral QID   losartan  25 mg Oral Daily   multivitamin with minerals  1 tablet Oral  Daily   pantoprazole  40 mg Oral Daily   tamsulosin  0.4 mg Oral QPM   Continuous Infusions:  sodium chloride Stopped (12/11/20 0535)   cefTRIAXone (ROCEPHIN)  IV 1 g (12/12/20 1331)   methocarbamol (ROBAXIN) IV       LOS: 2 days   Time spent: 43 minutes. More than 50% of the time was spent in counseling/coordination of care  Lorella Nimrod, MD [facility]  If 7PM-7AM, please contact night-coverage [email or web address]  12/12/2020, 3:01 PM   This record has been created using Systems analyst. Errors have been sought and corrected,but may not always be located. Such creation errors do not reflect on the standard of care.

## 2020-12-12 NOTE — Progress Notes (Signed)
Subjective: 2 Days Post-Op Procedure(s) (LRB): ARTHROPLASTY BIPOLAR HIP (HEMIARTHROPLASTY) (Left) Patient reports pain as mild at rest. Severe with PT. Patient is well, and has had no acute complaints or problems Current plan is for discharge to SNF when able. Negative for chest pain and shortness of breath Fever: no Gastrointestinal:negative for nausea and vomiting Patient with history of dementia.  Objective: Vital signs in last 24 hours: Temp:  [98.2 F (36.8 C)-101.5 F (38.6 C)] 100 F (37.8 C) (10/20 0739) Pulse Rate:  [43-93] 43 (10/20 0739) Resp:  [14-19] 14 (10/20 0739) BP: (123-140)/(56-87) 136/79 (10/20 0739) SpO2:  [93 %-96 %] 95 % (10/20 0739)  Intake/Output from previous day:  Intake/Output Summary (Last 24 hours) at 12/12/2020 0916 Last data filed at 12/11/2020 2046 Gross per 24 hour  Intake --  Output 600 ml  Net -600 ml    Intake/Output this shift: No intake/output data recorded.  Labs: Recent Labs    12/10/20 0437 12/11/20 0347 12/12/20 0417  HGB 12.8* 12.3* 12.0*   Recent Labs    12/11/20 0347 12/12/20 0417  WBC 11.6* 10.3  RBC 4.17* 4.08*  HCT 36.4* 35.6*  PLT 187 175   Recent Labs    12/11/20 0347 12/12/20 0417  NA 137 137  K 4.4 4.2  CL 107 107  CO2 23 24  BUN 23 31*  CREATININE 1.50* 1.57*  GLUCOSE 124* 98  CALCIUM 8.6* 8.7*   Recent Labs    12/10/20 0437  INR 1.1     EXAM General - Patient is Alert, Confused, and Lacking, patient with history of dementia. Extremity - ABD soft Neurovascular intact Dorsiflexion/Plantar flexion intact Incision: dressing C/D/I No cellulitis present Compartment soft Dressing/Incision - clean, dry, no drainage Motor Function - intact, moving foot and toes well on exam.  Abdomen soft with normal bowel sounds.  Past Medical History:  Diagnosis Date   Arthritis    Benign prostate hyperplasia    Cataracts, bilateral    Coarse tremors    L>R hands   COPD (chronic obstructive  pulmonary disease) (HCC)    first stages   Coronary artery disease    Dizziness    occasional   Gastric ulcer    in the 60's   GERD (gastroesophageal reflux disease)    takes Omeprazole every other day   Hard of hearing    doesn't wear hearing aids   History of colon polyps    History of kidney stones    still one on the left side, low   Hyperlipidemia    takes Simvastatin daily   Hypertension    stopped BP meds, BP under control   Macular degeneration    Myocardial infarction [facility]) 2009   Peripheral neuropathy    takes Gabapentin daily   Pneumonia    hx of in the 60's   Sleep apnea     Assessment/Plan: 2 Days Post-Op Procedure(s) (LRB): ARTHROPLASTY BIPOLAR HIP (HEMIARTHROPLASTY) (Left) Principal Problem:   Closed displaced fracture of left femoral neck (HCC) Active Problems:   Dementia due to Parkinson's disease without behavioral disturbance (HCC)   Hypertension   Coronary artery disease involving coronary bypass graft of native heart without angina pectoris   CKD (chronic kidney disease) stage 3, GFR 30-59 ml/min (HCC)   At high risk for falls   Malnutrition of moderate degree  Estimated body mass index is 25.1 kg/m as calculated from the following:   Height as of this encounter: 5\' 9"  (1.753 m).   Weight as  of this encounter: 77.1 kg. Advance diet Up with therapy D/C IV fluids when tolerating po intake. Vital signs are stable.  Low-grade temp noted.  Encourage incentive spirometer.  Continue to monitor.  No signs of infection throughout left lower extremity. Labs reviewed this morning.  Hemoglobin stable at 12.0. Continue to work on bowel movement Up with therapy.  Plan is for SNF upon discharge.  DVT Prophylaxis - Lovenox and Foot Pumps Weight-Bearing as tolerated to left leg  T. Rachelle Hora, PA-C [facility] 12/12/2020, 9:16 AM

## 2020-12-12 NOTE — Progress Notes (Signed)
Occupational Therapy Treatment Patient Details Name: Coulter Oldaker. MRN: [id number] DOB: Jan 21, 1929 Today's Date: 12/12/2020   History of present illness Si W Twan Harkin. is a 87 y.o. male with medical history significant for dementia, Parkinson's disease, coronary artery disease status post CABG, peripheral neuropathy, GERD who presents to the ER via EMS for evaluation following a fall at home.   OT comments  Mr. Jeziorski presents today with generalized weakness, limited endurance, reduced L LE ROM, L LE pain, and confusion. He requires Max A +2 assistance for bed mobility, cries out in pain with L LE movement. He is slightly more oriented today than yesterday -- aware today that he is in a hospital, and can correctly state the year. Unable to follow instructions to participate in grooming. DC to SNF remains appropriate plan; continue with acute OT during hospitalization.   Recommendations for follow up therapy are one component of a multi-disciplinary discharge planning process, led by the attending physician.  Recommendations may be updated based on patient status, additional functional criteria and insurance authorization.    Follow Up Recommendations  SNF    Equipment Recommendations  None recommended by OT    Recommendations for Other Services      Precautions / Restrictions Precautions Precautions: Fall;Posterior Hip Restrictions Weight Bearing Restrictions: No       Mobility Bed Mobility Overal bed mobility: Needs Assistance Bed Mobility: Supine to Sit;Sit to Supine     Supine to sit: Max assist;+2 for physical assistance;HOB elevated Sit to supine: Max assist;+2 for physical assistance   General bed mobility comments: MaxA + 2 due to posterior lean and guarding of the L hip    Transfers Overall transfer level: Needs assistance Equipment used: Rolling walker (2 wheeled) Transfers: Sit to/from Stand Sit to Stand: Max assist;+2 safety/equipment          General transfer comment: did not attempt, per nurse    Balance Overall balance assessment: Needs assistance Sitting-balance support: Bilateral upper extremity supported;Feet supported Sitting balance-Leahy Scale: Poor Sitting balance - Comments: Requiring moderate assistance to maintain seated position. Demonstrating posterior lean   Standing balance support: Bilateral upper extremity supported;During functional activity Standing balance-Leahy Scale: Zero Standing balance comment: Requiring maxA +2 for static standing balance. Unable to weight shift anteriorly to be able to take steps                           ADL either performed or assessed with clinical judgement   ADL Overall ADL's : Needs assistance/impaired     Grooming: Wash/dry face;Wash/dry hands;Maximal assistance                                 General ADL Comments: Max A + 2 for bed-level mobility     Vision       Perception     Praxis      Cognition Arousal/Alertness: Awake/alert Behavior During Therapy: Agitated;Anxious Overall Cognitive Status: History of cognitive impairments - at baseline                                 General Comments: Aware that he is at "a hospital." Can ID year as 2022, unable to state month.        Exercises Exercises: Total Joint Total Joint Exercises Ankle Circles/Pumps: 10 reps;AROM;Both Heel Slides: AAROM;Left;20  reps Hip ABduction/ADduction: AAROM;Left;20 reps Other Exercises Other Exercises: Educ with spouse re: DC recs, POC   Shoulder Instructions       General Comments      Pertinent Vitals/ Pain       Pain Assessment: Faces Faces Pain Scale: Hurts even more (with movement) Pain Location: L hip Pain Descriptors / Indicators: Discomfort;Grimacing;Guarding;Operative site guarding Pain Intervention(s): Limited activity within patient's tolerance;Repositioned;Monitored during session;Relaxation  Home Living                                           Prior Functioning/Environment              Frequency  Min 1X/week        Progress Toward Goals  OT Goals(current goals can now be found in the care plan section)  Progress towards OT goals: Progressing toward goals  Acute Rehab OT Goals Patient Stated Goal: to go to rehab OT Goal Formulation: With family Time For Goal Achievement: 12/25/20 Potential to Achieve Goals: Good  Plan Discharge plan remains appropriate;Frequency remains appropriate    Co-evaluation                 AM-PAC OT "6 Clicks" Daily Activity     Outcome Measure   Help from another person eating meals?: A Little Help from another person taking care of personal grooming?: A Little Help from another person toileting, which includes using toliet, bedpan, or urinal?: A Lot Help from another person bathing (including washing, rinsing, drying)?: A Lot Help from another person to put on and taking off regular upper body clothing?: A Little Help from another person to put on and taking off regular lower body clothing?: A Lot 6 Click Score: 15    End of Session    OT Visit Diagnosis: Unsteadiness on feet (R26.81);Muscle weakness (generalized) (M62.81);Pain;History of falling (Z91.81) Pain - Right/Left: Left   Activity Tolerance Patient tolerated treatment well   Patient Left in bed;with call bell/phone within reach;with bed alarm set;with nursing/sitter in room;with family/visitor present   Nurse Communication Mobility status        Time: 3845-3646 OT Time Calculation (min): 20 min  Charges: OT General Charges $OT Visit: 1 Visit OT Treatments $Self Care/Home Management : 8-22 mins  Josiah Lobo, PhD, MS, OTR/L 12/12/20, 1:02 PM

## 2020-12-13 DIAGNOSIS — S72002A Fracture of unspecified part of neck of left femur, initial encounter for closed fracture: Secondary | ICD-10-CM | POA: Diagnosis not present

## 2020-12-13 LAB — CBC
HCT: 32.3 % — ABNORMAL LOW (ref 39.0–52.0)
Hemoglobin: 10.8 g/dL — ABNORMAL LOW (ref 13.0–17.0)
MCH: 29.1 pg (ref 26.0–34.0)
MCHC: 33.4 g/dL (ref 30.0–36.0)
MCV: 87.1 fL (ref 80.0–100.0)
Platelets: 172 10*3/uL (ref 150–400)
RBC: 3.71 MIL/uL — ABNORMAL LOW (ref 4.22–5.81)
RDW: 15.5 % (ref 11.5–15.5)
WBC: 9.5 10*3/uL (ref 4.0–10.5)
nRBC: 0 % (ref 0.0–0.2)

## 2020-12-13 LAB — BASIC METABOLIC PANEL
Anion gap: 5 (ref 5–15)
BUN: 34 mg/dL — ABNORMAL HIGH (ref 8–23)
CO2: 23 mmol/L (ref 22–32)
Calcium: 8.4 mg/dL — ABNORMAL LOW (ref 8.9–10.3)
Chloride: 109 mmol/L (ref 98–111)
Creatinine, Ser: 1.45 mg/dL — ABNORMAL HIGH (ref 0.61–1.24)
GFR, Estimated: 46 mL/min — ABNORMAL LOW (ref >60)
Glucose, Bld: 120 mg/dL — ABNORMAL HIGH (ref 70–99)
Potassium: 4.1 mmol/L (ref 3.5–5.1)
Sodium: 137 mmol/L (ref 135–145)

## 2020-12-13 MED ORDER — ACETAMINOPHEN 500 MG PO TABS: 500.0000 mg | ORAL_TABLET | Freq: Four times a day (QID) | ORAL | 0 refills | Status: AC | PRN

## 2020-12-13 MED ORDER — ONDANSETRON HCL 4 MG PO TABS: 4.0000 mg | ORAL_TABLET | Freq: Four times a day (QID) | ORAL | 0 refills | Status: DC | PRN

## 2020-12-13 MED ORDER — ENOXAPARIN SODIUM 40 MG/0.4ML IJ SOSY: 40.0000 mg | PREFILLED_SYRINGE | INTRAMUSCULAR | 0 refills | Status: DC

## 2020-12-13 MED ORDER — HYDROCODONE-ACETAMINOPHEN 5-325 MG PO TABS: 1.0000 | ORAL_TABLET | Freq: Three times a day (TID) | ORAL | 0 refills | Status: AC | PRN

## 2020-12-13 MED ORDER — CEPHALEXIN 500 MG PO CAPS
500.0000 mg | ORAL_CAPSULE | Freq: Three times a day (TID) | ORAL | Status: DC
  Administered 2020-12-14 (×2): 500 mg via ORAL
  Filled 2020-12-13 (×2): qty 1

## 2020-12-13 NOTE — TOC Progression Note (Signed)
Transition of Care (TOC) - Progression Note    Patient Details  Name: Jason Cross. MRN: [id number] Date of Birth: [birth year]  Transition of Care [facility]) CM/SW [place], RN Phone Number: 12/13/2020, 3:10 PM  Clinical Narrative:     Damaris Schooner with [facility] at [facility], the insurance Josem Kaufmann is still pending, The patient may get approval tomorrow and will be able to DC when he does, she will notify the [facility] team member once they get approval       Expected Discharge Plan and Services                                                 Social Determinants of Health (SDOH) Interventions    Readmission Risk Interventions No flowsheet data found.

## 2020-12-13 NOTE — Plan of Care (Signed)

## 2020-12-13 NOTE — Progress Notes (Signed)
PROGRESS NOTE    Jason Cross.  DGL:[id number] DOB: [birth year] DOA: 12/10/2020 PCP: Venita Lick, NP   Brief Narrative: Taken from H&P. Jason Cross. is a 87 y.o. male with medical history significant for dementia, Parkinson's disease, coronary artery disease status post CABG, peripheral neuropathy, GERD who presents to the ER via EMS for evaluation following a fall at home. At baseline patient ambulates with a rolling walker.  Patient with progressive decline and frequent falls for the past 84-month. Found to have impacted left femoral neck fracture, no other acute abnormality noted. Taken to the OR and underwent left bipolar hemiarthroplasty with orthopedic surgery on 12/10/2020. PT is recommending SNF-TOC started the work-up  10/20: Patient developed fever overnight, up to 101.4.  History of UTI with pansensitive E. coli during prior admission with Foley catheter. Unable to void after removing catheter, Foley was replaced.  Will need to be discharged with Foley catheter with a close follow-up with urology as an outpatient.  Patient is high risk for deterioration and death based on age and prior comorbidities with recent femoral neck fracture.  10/21: Fever resolved, still pending insurance authorization for SNF.  Subjective: Patient was feeling much improved when seen today.  No new complaints.  Wife at bedside.  Had 2 loose bowel movements since this morning.  Wife is concerned about laxative use.  Assessment & Plan:   Principal Problem:   Closed displaced fracture of left femoral neck (HCC) Active Problems:   Dementia due to Parkinson's disease without behavioral disturbance (HCC)   Hypertension   Coronary artery disease involving coronary bypass graft of native heart without angina pectoris   CKD (chronic kidney disease) stage 3, GFR 30-59 ml/min (HCC)   At high risk for falls   Malnutrition of moderate degree  Left femoral neck fracture.  Secondary to  mechanical fall s/p bipolar hemiarthroplasty. Pain seems well controlled under current regimen.  History of frequent falls and progressively worsening weakness with history of underlying Parkinson's disease and dementia. PT is recommending SNF-insurance authorization still pending. -Continue with pain management and PT.  Fever.  Remained afebrile over the past 24 hours.  Patient unable to explain any urinary symptoms.  UA with some leukocytosis.  Urine cultures pending.  Preliminary blood cultures negative Can be postoperative SIRS response.  He was started on ceftriaxone. -Follow-up urine cultures -Switch ceftriaxone to Keflex based on prior urine culture results.  History of Parkinson's disease and dementia.  Not on any medications for Parkinson's at home. -Continue home dose of Aricept  CKD stage IIIb.  Creatinine seems stable. -Monitor renal function. -Avoid nephrotoxins.  Hypertension.  Blood pressure within goal. -Continue home Cozaar  BPH/urinary retention.  Foley was replaced yesterday as patient was unable to void requiring in and out catheter.  Patient will be discharged with Foley catheter and will follow-up with his urologist closely for further recommendations. -Continue home Flomax  Objective: Vitals:   12/13/20 0406 12/13/20 0747 12/13/20 1132 12/13/20 1605  BP: 130/75 135/85 108/73 127/82  Pulse: 90 86 83 88  Resp: 17 16 17 16   Temp: 98.2 F (36.8 C) 97.7 F (36.5 C) 98.5 F (36.9 C) 98.6 F (37 C)  TempSrc: Oral Oral    SpO2: 97% 97% 96% 96%  Weight:      Height:        Intake/Output Summary (Last 24 hours) at 12/13/2020 1625 Last data filed at 12/13/2020 1416 Gross per 24 hour  Intake 810 ml  Output  1350 ml  Net -540 ml    Filed Weights   12/10/20 0445  Weight: 77.1 kg    Examination:  General.  Frail elderly man, in no acute distress. Pulmonary.  Lungs clear bilaterally, normal respiratory effort. CV.  Regular rate and rhythm, no JVD, rub or  murmur. Abdomen.  Soft, nontender, nondistended, BS positive. CNS.  Alert and oriented .  No focal neurologic deficit. Extremities.  No edema, no cyanosis, pulses intact and symmetrical. Psychiatry.  Judgment and insight appears impaired.  DVT prophylaxis: Lovenox Code Status: Full Family Communication: Discussed with wife at bedside Disposition Plan:  Status is: Inpatient  Remains inpatient appropriate because: Of severity of illness  Level of care: Med-Surg  All the records are reviewed and case discussed with Care Management/Social Worker. Management plans discussed with the patient, nursing and they are in agreement.  Consultants:  Orthopedic surgery  Procedures:  Antimicrobials:  Keflex  Data Reviewed: I have personally reviewed following labs and imaging studies  CBC: Recent Labs  Lab 12/10/20 0437 12/11/20 0347 12/12/20 0417 12/13/20 0317  WBC 6.7 11.6* 10.3 9.5  NEUTROABS 3.7  --   --   --   HGB 12.8* 12.3* 12.0* 10.8*  HCT 39.7 36.4* 35.6* 32.3*  MCV 89.0 87.3 87.3 87.1  PLT 231 187 175 580    Basic Metabolic Panel: Recent Labs  Lab 12/10/20 0437 12/11/20 0347 12/12/20 0417 12/13/20 0317  NA 142 137 137 137  K 4.0 4.4 4.2 4.1  CL 107 107 107 109  CO2 27 23 24 23   GLUCOSE 99 124* 98 120*  BUN 25* 23 31* 34*  CREATININE 1.58* 1.50* 1.57* 1.45*  CALCIUM 9.1 8.6* 8.7* 8.4*    GFR: Estimated Creatinine Clearance: 33.9 mL/min (A) (by C-G formula based on SCr of 1.45 mg/dL (H)). Liver Function Tests: No results for input(s): AST, ALT, ALKPHOS, BILITOT, PROT, ALBUMIN in the last 168 hours. No results for input(s): LIPASE, AMYLASE in the last 168 hours. No results for input(s): AMMONIA in the last 168 hours. Coagulation Profile: Recent Labs  Lab 12/10/20 0437  INR 1.1    Cardiac Enzymes: No results for input(s): CKTOTAL, CKMB, CKMBINDEX, TROPONINI in the last 168 hours. BNP (last 3 results) No results for input(s): PROBNP in the last 8760  hours. HbA1C: No results for input(s): HGBA1C in the last 72 hours. CBG: Recent Labs  Lab 12/12/20 2214  GLUCAP 125*   Lipid Profile: No results for input(s): CHOL, HDL, LDLCALC, TRIG, CHOLHDL, LDLDIRECT in the last 72 hours. Thyroid Function Tests: No results for input(s): TSH, T4TOTAL, FREET4, T3FREE, THYROIDAB in the last 72 hours. Anemia Panel: No results for input(s): VITAMINB12, FOLATE, FERRITIN, TIBC, IRON, RETICCTPCT in the last 72 hours. Sepsis Labs: No results for input(s): PROCALCITON, LATICACIDVEN in the last 168 hours.  Recent Results (from the past 240 hour(s))  Resp Panel by RT-PCR (Flu A&B, Covid) Nasopharyngeal Swab     Status: None   Collection Time: 12/10/20  6:56 AM   Specimen: Nasopharyngeal Swab; Nasopharyngeal(NP) swabs in vial transport medium  Result Value Ref Range Status   SARS Coronavirus 2 by RT PCR NEGATIVE NEGATIVE Final    Comment: (NOTE) SARS-CoV-2 target nucleic acids are NOT DETECTED.  The SARS-CoV-2 RNA is generally detectable in upper respiratory specimens during the acute phase of infection. The lowest concentration of SARS-CoV-2 viral copies this assay can detect is 138 copies/mL. A negative result does not preclude SARS-Cov-2 infection and should not be used as the  sole basis for treatment or other patient management decisions. A negative result may occur with  improper specimen collection/handling, submission of specimen other than nasopharyngeal swab, presence of viral mutation(s) within the areas targeted by this assay, and inadequate number of viral copies(<138 copies/mL). A negative result must be combined with clinical observations, patient history, and epidemiological information. The expected result is Negative.  Fact Sheet for Patients:  EntrepreneurPulse.com.au  Fact Sheet for Healthcare Providers:  IncredibleEmployment.be  This test is no t yet approved or cleared by the [place]  FDA and  has been authorized for detection and/or diagnosis of SARS-CoV-2 by FDA under an Emergency Use Authorization (EUA). This EUA will remain  in effect (meaning this test can be used) for the duration of the COVID-19 declaration under Section 564(b)(1) of the Act, 21 U.S.C.section 360bbb-3(b)(1), unless the authorization is terminated  or revoked sooner.       Influenza A by PCR NEGATIVE NEGATIVE Final   Influenza B by PCR NEGATIVE NEGATIVE Final    Comment: (NOTE) The Xpert Xpress SARS-CoV-2/FLU/RSV plus assay is intended as an aid in the diagnosis of influenza from Nasopharyngeal swab specimens and should not be used as a sole basis for treatment. Nasal washings and aspirates are unacceptable for Xpert Xpress SARS-CoV-2/FLU/RSV testing.  Fact Sheet for Patients: EntrepreneurPulse.com.au  Fact Sheet for Healthcare Providers: IncredibleEmployment.be  This test is not yet approved or cleared by the [place] FDA and has been authorized for detection and/or diagnosis of SARS-CoV-2 by FDA under an Emergency Use Authorization (EUA). This EUA will remain in effect (meaning this test can be used) for the duration of the COVID-19 declaration under Section 564(b)(1) of the Act, 21 U.S.C. section 360bbb-3(b)(1), unless the authorization is terminated or revoked.  Performed at [facility], [address] [place], [place] [address]   CULTURE, BLOOD (ROUTINE X 2) w Reflex to ID Panel     Status: None (Preliminary result)   Collection Time: 12/12/20 11:00 AM   Specimen: BLOOD  Result Value Ref Range Status   Specimen Description BLOOD [facility]  Final   Special Requests BOTTLES DRAWN AEROBIC AND ANAEROBIC BCAV  Final   Culture   Final    NO GROWTH < 24 HOURS Performed at [facility], [address] [place], [place] [address]    Report Status PENDING  Incomplete  CULTURE, BLOOD (ROUTINE X 2) w Reflex to ID Panel      Status: None (Preliminary result)   Collection Time: 12/12/20 12:02 PM   Specimen: BLOOD  Result Value Ref Range Status   Specimen Description BLOOD BLOOD LEFT WRIST  Final   Special Requests   Final    BOTTLES DRAWN AEROBIC AND ANAEROBIC Blood Culture adequate volume   Culture   Final    NO GROWTH < 24 HOURS Performed at [facility], [address] [place], [place] [address]    Report Status PENDING  Incomplete      Radiology Studies: No results found.  Scheduled Meds:  [START ON 12/14/2020] cephALEXin  500 mg Oral Q8H   Chlorhexidine Gluconate Cloth  6 each Topical Daily   docusate sodium  100 mg Oral BID   donepezil  10 mg Oral QHS   enoxaparin (LOVENOX) injection  40 mg Subcutaneous Q24H   feeding supplement  237 mL Oral BID BM   gabapentin  300 mg Oral QID   losartan  25 mg Oral Daily   multivitamin with minerals  1 tablet Oral  Daily   pantoprazole  40 mg Oral Daily   tamsulosin  0.4 mg Oral QPM   Continuous Infusions:  sodium chloride 75 mL/hr at 12/13/20 0526   methocarbamol (ROBAXIN) IV       LOS: 3 days   Time spent: 38 minutes. More than 50% of the time was spent in counseling/coordination of care  Lorella Nimrod, MD [facility]  If 7PM-7AM, please contact night-coverage [email or web address]  12/13/2020, 4:25 PM   This record has been created using Systems analyst. Errors have been sought and corrected,but may not always be located. Such creation errors do not reflect on the standard of care.

## 2020-12-13 NOTE — Plan of Care (Signed)
Patient is alert and oriented to self which seems to be his baseline. No major events occurred during shift. Vitals signs are stable, and patient seemed to not be in any pain. No further needs address. Will continue to monitor.  Problem: Education: Goal: Knowledge of General Education information will improve Description: Including pain rating scale, medication(s)/side effects and non-pharmacologic comfort measures Outcome: Progressing   Problem: Clinical Measurements: Goal: Ability to maintain clinical measurements within normal limits will improve Outcome: Progressing Goal: Will remain free from infection Outcome: Progressing Goal: Diagnostic test results will improve Outcome: Progressing Goal: Respiratory complications will improve Outcome: Progressing Goal: Cardiovascular complication will be avoided Outcome: Progressing   Problem: Health Behavior/Discharge Planning: Goal: Ability to manage health-related needs will improve Outcome: Progressing   Problem: Elimination: Goal: Will not experience complications related to bowel motility Outcome: Progressing Goal: Will not experience complications related to urinary retention Outcome: Progressing

## 2020-12-13 NOTE — Progress Notes (Signed)
Physical Therapy Treatment Patient Details Name: Jason Cross. MRN: [id number] DOB: 1928-09-01 Today's Date: 12/13/2020   History of Present Illness Jason Cross. is a 87 y.o. male with medical history significant for dementia, Parkinson's disease, coronary artery disease status post CABG, peripheral neuropathy, GERD who presents to the ER via EMS for evaluation following a fall at home.    PT Comments    Pt received seated on BSC with NT present for safety. Pt requiring modA for sit to stand from [facility] and NT for peri-care due to poor standing balance. Pt able to complete sit to stands x 5 during functional activities with modA and demonstrating improved WB through surgical LE. Pt completed stand pivot transfer back to bed with modA and maximal tactile and verbal cues for sequencing of RW and B LE movement. Pt will continue to benefit from skilled PT services to improve mobility and quality of life.     Recommendations for follow up therapy are one component of a multi-disciplinary discharge planning process, led by the attending physician.  Recommendations may be updated based on patient status, additional functional criteria and insurance authorization.  Follow Up Recommendations  SNF;Supervision/Assistance - 24 hour     Equipment Recommendations  Other (comment)    Recommendations for Other Services       Precautions / Restrictions Precautions Precautions: Fall;Posterior Hip Restrictions Weight Bearing Restrictions: No     Mobility  Bed Mobility Overal bed mobility: Needs Assistance Bed Mobility: Sit to Supine       Sit to supine: Mod assist;+2 for physical assistance   General bed mobility comments: Management of trunk and B LEs + 2 for assistance    Transfers Overall transfer level: Needs assistance Equipment used: Rolling walker (2 wheeled) Transfers: Sit to/from Stand x 5 Sit to Stand: Mod assist;From elevated surface Stand pivot transfers: Mod  assist;From elevated surface       General transfer comment: ModA for sit to stand and maximal multimodal cueing for stand pivot transfer back to bed from [facility]  Ambulation/Gait             General Gait Details: Deferred further ambulation due to posterior lean and patient safety   Stairs             Wheelchair Mobility    Modified Rankin (Stroke Patients Only)       Balance Overall balance assessment: Needs assistance Sitting-balance support: Bilateral upper extremity supported;Feet supported Sitting balance-Leahy Scale: Fair Sitting balance - Comments: Improved seated balance compared to yesterday   Standing balance support: Bilateral upper extremity supported;During functional activity Standing balance-Leahy Scale: Poor Standing balance comment: Requires B UE assist for transfers                            Cognition Arousal/Alertness: Awake/alert Behavior During Therapy: Flat affect Overall Cognitive Status: History of cognitive impairments - at baseline                                        Exercises Other Exercises Other Exercises: sit to stand transfers x 5 for peri-care. Pt demonstrating increased muscular fatigue    General Comments General comments (skin integrity, edema, etc.): Pt positioned at end of session with two pillows between legs to limit hip IR and adduction per hip precautions. SCDs reapplied. All needs within reach  Pertinent Vitals/Pain Pain Assessment: No/denies pain    Home Living                      Prior Function            PT Goals (current goals can now be found in the care plan section) Acute Rehab PT Goals Patient Stated Goal: to go to rehab PT Goal Formulation: Patient unable to participate in goal setting Progress towards PT goals: Progressing toward goals    Frequency    7X/week      PT Plan Current plan remains appropriate    Co-evaluation               AM-PAC PT "6 Clicks" Mobility   Outcome Measure  Help needed turning from your back to your side while in a flat bed without using bedrails?: A Lot Help needed moving from lying on your back to sitting on the side of a flat bed without using bedrails?: A Lot Help needed moving to and from a bed to a chair (including a wheelchair)?: A Lot Help needed standing up from a chair using your arms (e.g., wheelchair or bedside chair)?: A Lot Help needed to walk in hospital room?: Total Help needed climbing 3-5 steps with a railing? : Total 6 Click Score: 10    End of Session Equipment Utilized During Treatment: Gait belt Activity Tolerance: Patient limited by pain Patient left: in bed;with bed alarm set;with family/visitor present;with call bell/phone within reach Nurse Communication: Mobility status PT Visit Diagnosis: Unsteadiness on feet (R26.81);History of falling (Z91.81);Other abnormalities of gait and mobility (R26.89);Repeated falls (R29.6);Muscle weakness (generalized) (M62.81);Pain Pain - Right/Left: Left Pain - part of body: Hip     Time: 7673-4193 PT Time Calculation (min) (ACUTE ONLY): 18 min  Charges:  $Therapeutic Activity: 8-22 mins                     Andrey Campanile, SPT    Andrey Campanile 12/13/2020, 12:29 PM

## 2020-12-13 NOTE — TOC Progression Note (Addendum)
Transition of Care (TOC) - Progression Note    Patient Details  Name: Jason Cross. MRN: [id number] Date of Birth: [birth year]  Transition of Care [facility]) CM/SW Kraemer, RN Phone Number: 12/13/2020, 10:43 AM  Clinical Narrative:     Reached out to [facility] to check on the insurance auth status, awaiting a response  Holland Falling is requesting ,ore clinical notes and information, Jason Cross with [facility] submitting, Still pending        Expected Discharge Plan and Services                                                 Social Determinants of Health (SDOH) Interventions    Readmission Risk Interventions No flowsheet data found.

## 2020-12-13 NOTE — Progress Notes (Signed)
Subjective: 3 Days Post-Op Procedure(s) (LRB): ARTHROPLASTY BIPOLAR HIP (HEMIARTHROPLASTY) (Left) Patient is resting comfortably this morning.  No pain in bed. Patient is well, and has had no acute complaints or problems Current plan is for discharge to SNF when able. Negative for chest pain and shortness of breath Fever: no Gastrointestinal:negative for nausea and vomiting Patient with history of dementia, unable to provide a accurate history.  Objective: Vital signs in last 24 hours: Temp:  [97.8 F (36.6 C)-99 F (37.2 C)] 98.2 F (36.8 C) (10/21 0406) Pulse Rate:  [56-102] 90 (10/21 0406) Resp:  [14-19] 17 (10/21 0406) BP: (100-151)/(63-86) 130/75 (10/21 0406) SpO2:  [95 %-100 %] 97 % (10/21 0406)  Intake/Output from previous day:  Intake/Output Summary (Last 24 hours) at 12/13/2020 0739 Last data filed at 12/13/2020 0620 Gross per 24 hour  Intake 810 ml  Output 1550 ml  Net -740 ml    Intake/Output this shift: No intake/output data recorded.  Labs: Recent Labs    12/11/20 0347 12/12/20 0417 12/13/20 0317  HGB 12.3* 12.0* 10.8*   Recent Labs    12/12/20 0417 12/13/20 0317  WBC 10.3 9.5  RBC 4.08* 3.71*  HCT 35.6* 32.3*  PLT 175 172   Recent Labs    12/12/20 0417 12/13/20 0317  NA 137 137  K 4.2 4.1  CL 107 109  CO2 24 23  BUN 31* 34*  CREATININE 1.57* 1.45*  GLUCOSE 98 120*  CALCIUM 8.7* 8.4*   No results for input(s): LABPT, INR in the last 72 hours.    EXAM General - Patient is sleeping well this morning.   Extremity - ABD soft Neurovascular intact Dorsiflexion/Plantar flexion intact Incision: scant drainage No cellulitis present Compartment soft Dressing/Incision - Mild bloody drainage noted to the left hip. Motor Function - intact, moving foot and toes well on exam.  Abdomen soft with normal bowel sounds this morning.  Past Medical History:  Diagnosis Date   Arthritis    Benign prostate hyperplasia    Cataracts, bilateral     Coarse tremors    L>R hands   COPD (chronic obstructive pulmonary disease) (HCC)    first stages   Coronary artery disease    Dizziness    occasional   Gastric ulcer    in the 60's   GERD (gastroesophageal reflux disease)    takes Omeprazole every other day   Hard of hearing    doesn't wear hearing aids   History of colon polyps    History of kidney stones    still one on the left side, low   Hyperlipidemia    takes Simvastatin daily   Hypertension    stopped BP meds, BP under control   Macular degeneration    Myocardial infarction [facility]) 2009   Peripheral neuropathy    takes Gabapentin daily   Pneumonia    hx of in the 60's   Sleep apnea    Assessment/Plan: 3 Days Post-Op Procedure(s) (LRB): ARTHROPLASTY BIPOLAR HIP (HEMIARTHROPLASTY) (Left) Principal Problem:   Closed displaced fracture of left femoral neck (HCC) Active Problems:   Dementia due to Parkinson's disease without behavioral disturbance (HCC)   Hypertension   Coronary artery disease involving coronary bypass graft of native heart without angina pectoris   CKD (chronic kidney disease) stage 3, GFR 30-59 ml/min (HCC)   At high risk for falls   Malnutrition of moderate degree  Estimated body mass index is 25.1 kg/m as calculated from the following:   Height as of  this encounter: 5\' 9"  (1.753 m).   Weight as of this encounter: 77.1 kg. Advance diet Up with therapy D/C IV fluids when tolerating po intake. Vital signs are stable this morning.  No recent fevers this AM. Labs reviewed this morning.  Hemoglobin stable at 10.8. Continue to work on bowel movement.  The patient has had two bowel movements. Up with therapy.  Plan is for SNF upon discharge.  Upon discharge, continue Lovenox 40mg  daily for 14 days. Staples can be removed by SNF on 12/24/20.  Follow-up with [facility] orthopaedics in 6 weeks.  DVT Prophylaxis - Lovenox and Foot Pumps Weight-Bearing as tolerated to left leg  J. Cameron Proud,  PA-C [facility] Orthopaedic Surgery 12/13/2020, 7:39 AM

## 2020-12-13 NOTE — Care Management Important Message (Signed)
Important Message  Patient Details  Name: Jason Cross. MRN: [id number] Date of Birth: [birth year]   Medicare Important Message Given:  Yes     Juliann Pulse A Rhonda Vangieson 12/13/2020, 12:24 PM

## 2020-12-14 DIAGNOSIS — S72002A Fracture of unspecified part of neck of left femur, initial encounter for closed fracture: Secondary | ICD-10-CM | POA: Diagnosis not present

## 2020-12-14 LAB — URINE CULTURE: Culture: NO GROWTH

## 2020-12-14 MED ORDER — QUETIAPINE FUMARATE 25 MG PO TABS
25.0000 mg | ORAL_TABLET | Freq: Every day | ORAL | Status: DC
  Administered 2020-12-14 – 2020-12-15 (×2): 25 mg via ORAL
  Filled 2020-12-14 (×2): qty 1

## 2020-12-14 NOTE — Progress Notes (Signed)
PROGRESS NOTE    Jason Cross.  HYI:[id number] DOB: 07-10-1928 DOA: 12/10/2020 PCP: Venita Lick, NP   Brief Narrative: Taken from H&P. Jason Cross. is a 87 y.o. male with medical history significant for dementia, Parkinson's disease, coronary artery disease status post CABG, peripheral neuropathy, GERD who presents to the ER via EMS for evaluation following a fall at home. At baseline patient ambulates with a rolling walker.  Patient with progressive decline and frequent falls for the past 23-month. Found to have impacted left femoral neck fracture, no other acute abnormality noted. Taken to the OR and underwent left bipolar hemiarthroplasty with orthopedic surgery on 12/10/2020. PT is recommending SNF-TOC started the work-up  10/20: Patient developed fever overnight, up to 101.4.  History of UTI with pansensitive E. coli during prior admission with Foley catheter. Unable to void after removing catheter, Foley was replaced.  Will need to be discharged with Foley catheter with a close follow-up with urology as an outpatient.  Patient is high risk for deterioration and death based on age and prior comorbidities with recent femoral neck fracture.  10/21: Fever resolved, still pending insurance authorization for SNF.  Subjective: Patient was seen and examined today.  Wife at bedside, she was concerned about sundowning and becoming more confused at night.  He was becoming little agitated per wife during the nighttime only. He appears little more confused.  When asked he denies any pain or any other symptoms.  Oriented to self only.  Assessment & Plan:   Principal Problem:   Closed displaced fracture of left femoral neck (HCC) Active Problems:   Dementia due to Parkinson's disease without behavioral disturbance (HCC)   Hypertension   Coronary artery disease involving coronary bypass graft of native heart without angina pectoris   CKD (chronic kidney disease) stage  3, GFR 30-59 ml/min (HCC)   At high risk for falls   Malnutrition of moderate degree  Left femoral neck fracture.  Secondary to mechanical fall s/p bipolar hemiarthroplasty. Pain seems well controlled under current regimen.  History of frequent falls and progressively worsening weakness with history of underlying Parkinson's disease and dementia. PT is recommending SNF-insurance authorization still pending. -Continue with pain management and PT.  Fever.  Remained afebrile over the past 48 hours.  Patient unable to explain any urinary symptoms.  UA with some leukocytosis.  Urine cultures with no growth.  Preliminary blood cultures negative Can be postoperative SIRS response.  He was started on ceftriaxone and later switched to Keflex. -Discontinue antibiotics.  Sundowning.  Patient is experiencing sundowning which is very common in his age group and with prior diagnosis of dementia.  Unable to sleep properly despite getting melatonin per wife. -Start him on low-dose Seroquel at night. -Delirium precautions  History of Parkinson's disease and dementia.  Not on any medications for Parkinson's at home. -Continue home dose of Aricept  CKD stage IIIb.  Creatinine seems stable. -Monitor renal function. -Avoid nephrotoxins.  Hypertension.  Blood pressure within goal. -Continue home Cozaar  BPH/urinary retention.  Foley was replaced yesterday as patient was unable to void requiring in and out catheter.  Patient will be discharged with Foley catheter and will follow-up with his urologist closely for further recommendations. -Continue home Flomax  Objective: Vitals:   12/13/20 2011 12/13/20 2257 12/14/20 0318 12/14/20 0820  BP: (!) 172/78 (!) 142/72 (!) 148/80 (!) 149/76  Pulse: (!) 51 97 91 (!) 52  Resp: 18 16 18 16   Temp: 98.8 F (37.1  C) 99.4 F (37.4 C) 98.2 F (36.8 C) 97.8 F (36.6 C)  TempSrc: Oral Oral Oral   SpO2: 100% 96% 96% 94%  Weight:      Height:         Intake/Output Summary (Last 24 hours) at 12/14/2020 1420 Last data filed at 12/14/2020 1017 Gross per 24 hour  Intake 100 ml  Output 850 ml  Net -750 ml    Filed Weights   12/10/20 0445  Weight: 77.1 kg    Examination:  General.  Chronically ill-appearing elderly man, in no acute distress. Pulmonary.  Lungs clear bilaterally, normal respiratory effort. CV.  Regular rate and rhythm, no JVD, rub or murmur. Abdomen.  Soft, nontender, nondistended, BS positive. CNS.  Alert and oriented to self only.  No focal neurologic deficit. Extremities.  No edema, no cyanosis, pulses intact and symmetrical. Psychiatry.  Judgment and insight appears impaired.  DVT prophylaxis: Lovenox Code Status: Full Family Communication: Discussed with wife at bedside Disposition Plan:  Status is: Inpatient  Remains inpatient appropriate because: Of severity of illness  Level of care: Med-Surg  All the records are reviewed and case discussed with Care Management/Social Worker. Management plans discussed with the patient, nursing and they are in agreement.  Consultants:  Orthopedic surgery  Procedures:  Antimicrobials:   Data Reviewed: I have personally reviewed following labs and imaging studies  CBC: Recent Labs  Lab 12/10/20 0437 12/11/20 0347 12/12/20 0417 12/13/20 0317  WBC 6.7 11.6* 10.3 9.5  NEUTROABS 3.7  --   --   --   HGB 12.8* 12.3* 12.0* 10.8*  HCT 39.7 36.4* 35.6* 32.3*  MCV 89.0 87.3 87.3 87.1  PLT 231 187 175 366    Basic Metabolic Panel: Recent Labs  Lab 12/10/20 0437 12/11/20 0347 12/12/20 0417 12/13/20 0317  NA 142 137 137 137  K 4.0 4.4 4.2 4.1  CL 107 107 107 109  CO2 27 23 24 23   GLUCOSE 99 124* 98 120*  BUN 25* 23 31* 34*  CREATININE 1.58* 1.50* 1.57* 1.45*  CALCIUM 9.1 8.6* 8.7* 8.4*    GFR: Estimated Creatinine Clearance: 33.9 mL/min (A) (by C-G formula based on SCr of 1.45 mg/dL (H)). Liver Function Tests: No results for input(s): AST,  ALT, ALKPHOS, BILITOT, PROT, ALBUMIN in the last 168 hours. No results for input(s): LIPASE, AMYLASE in the last 168 hours. No results for input(s): AMMONIA in the last 168 hours. Coagulation Profile: Recent Labs  Lab 12/10/20 0437  INR 1.1    Cardiac Enzymes: No results for input(s): CKTOTAL, CKMB, CKMBINDEX, TROPONINI in the last 168 hours. BNP (last 3 results) No results for input(s): PROBNP in the last 8760 hours. HbA1C: No results for input(s): HGBA1C in the last 72 hours. CBG: Recent Labs  Lab 12/12/20 2214  GLUCAP 125*    Lipid Profile: No results for input(s): CHOL, HDL, LDLCALC, TRIG, CHOLHDL, LDLDIRECT in the last 72 hours. Thyroid Function Tests: No results for input(s): TSH, T4TOTAL, FREET4, T3FREE, THYROIDAB in the last 72 hours. Anemia Panel: No results for input(s): VITAMINB12, FOLATE, FERRITIN, TIBC, IRON, RETICCTPCT in the last 72 hours. Sepsis Labs: No results for input(s): PROCALCITON, LATICACIDVEN in the last 168 hours.  Recent Results (from the past 240 hour(s))  Resp Panel by RT-PCR (Flu A&B, Covid) Nasopharyngeal Swab     Status: None   Collection Time: 12/10/20  6:56 AM   Specimen: Nasopharyngeal Swab; Nasopharyngeal(NP) swabs in vial transport medium  Result Value Ref Range Status  SARS Coronavirus 2 by RT PCR NEGATIVE NEGATIVE Final    Comment: (NOTE) SARS-CoV-2 target nucleic acids are NOT DETECTED.  The SARS-CoV-2 RNA is generally detectable in upper respiratory specimens during the acute phase of infection. The lowest concentration of SARS-CoV-2 viral copies this assay can detect is 138 copies/mL. A negative result does not preclude SARS-Cov-2 infection and should not be used as the sole basis for treatment or other patient management decisions. A negative result may occur with  improper specimen collection/handling, submission of specimen other than nasopharyngeal swab, presence of viral mutation(s) within the areas targeted by this  assay, and inadequate number of viral copies(<138 copies/mL). A negative result must be combined with clinical observations, patient history, and epidemiological information. The expected result is Negative.  Fact Sheet for Patients:  EntrepreneurPulse.com.au  Fact Sheet for Healthcare Providers:  IncredibleEmployment.be  This test is no t yet approved or cleared by the [place] FDA and  has been authorized for detection and/or diagnosis of SARS-CoV-2 by FDA under an Emergency Use Authorization (EUA). This EUA will remain  in effect (meaning this test can be used) for the duration of the COVID-19 declaration under Section 564(b)(1) of the Act, 21 U.S.C.section 360bbb-3(b)(1), unless the authorization is terminated  or revoked sooner.       Influenza A by PCR NEGATIVE NEGATIVE Final   Influenza B by PCR NEGATIVE NEGATIVE Final    Comment: (NOTE) The Xpert Xpress SARS-CoV-2/FLU/RSV plus assay is intended as an aid in the diagnosis of influenza from Nasopharyngeal swab specimens and should not be used as a sole basis for treatment. Nasal washings and aspirates are unacceptable for Xpert Xpress SARS-CoV-2/FLU/RSV testing.  Fact Sheet for Patients: EntrepreneurPulse.com.au  Fact Sheet for Healthcare Providers: IncredibleEmployment.be  This test is not yet approved or cleared by the [place] FDA and has been authorized for detection and/or diagnosis of SARS-CoV-2 by FDA under an Emergency Use Authorization (EUA). This EUA will remain in effect (meaning this test can be used) for the duration of the COVID-19 declaration under Section 564(b)(1) of the Act, 21 U.S.C. section 360bbb-3(b)(1), unless the authorization is terminated or revoked.  Performed at [facility], [address] [place], [place] [address]   CULTURE, BLOOD (ROUTINE X 2) w Reflex to ID Panel     Status: None (Preliminary  result)   Collection Time: 12/12/20 11:00 AM   Specimen: BLOOD  Result Value Ref Range Status   Specimen Description BLOOD [facility]  Final   Special Requests BOTTLES DRAWN AEROBIC AND ANAEROBIC BCAV  Final   Culture   Final    NO GROWTH < 24 HOURS Performed at [facility], [address], [place], [place] [address]    Report Status PENDING  Incomplete  CULTURE, BLOOD (ROUTINE X 2) w Reflex to ID Panel     Status: None (Preliminary result)   Collection Time: 12/12/20 12:02 PM   Specimen: BLOOD  Result Value Ref Range Status   Specimen Description BLOOD BLOOD LEFT WRIST  Final   Special Requests   Final    BOTTLES DRAWN AEROBIC AND ANAEROBIC Blood Culture adequate volume   Culture   Final    NO GROWTH < 24 HOURS Performed at [facility], [address], [place], [place] [address]    Report Status PENDING  Incomplete  Urine Culture     Status: None   Collection Time: 12/12/20  1:18 PM   Specimen: In/Out Cath Urine  Result Value Ref Range  Status   Specimen Description   Final    IN/OUT CATH URINE Performed at [facility], [address] [place], [place] [address]    Special Requests   Final    NONE Performed at [facility], [address] [place], [place] [address]    Culture   Final    NO GROWTH Performed at [facility], [place] [address], [place], [place] [address]    Report Status 12/14/2020 FINAL  Final      Radiology Studies: No results found.  Scheduled Meds:  cephALEXin  500 mg Oral Q8H   Chlorhexidine Gluconate Cloth  6 each Topical Daily   docusate sodium  100 mg Oral BID   donepezil  10 mg Oral QHS   enoxaparin (LOVENOX) injection  40 mg Subcutaneous Q24H   feeding supplement  237 mL Oral BID BM   gabapentin  300 mg Oral QID   losartan  25 mg Oral Daily   multivitamin with minerals  1 tablet Oral Daily   pantoprazole  40 mg Oral Daily   QUEtiapine  25 mg Oral QHS   tamsulosin  0.4 mg Oral QPM   Continuous  Infusions:  sodium chloride 75 mL/hr at 12/13/20 0526   methocarbamol (ROBAXIN) IV       LOS: 4 days   Time spent: 35 minutes. More than 50% of the time was spent in counseling/coordination of care  Lorella Nimrod, MD [facility]  If 7PM-7AM, please contact night-coverage [email or web address]  12/14/2020, 2:20 PM   This record has been created using Systems analyst. Errors have been sought and corrected,but may not always be located. Such creation errors do not reflect on the standard of care.

## 2020-12-14 NOTE — Plan of Care (Signed)

## 2020-12-14 NOTE — Progress Notes (Signed)
Subjective: 4 Days Post-Op Procedure(s) (LRB): ARTHROPLASTY BIPOLAR HIP (HEMIARTHROPLASTY) (Left) Patient is resting comfortably this morning.  Moderate pain Patient is well, and has had no acute complaints or problems Current plan is for discharge to SNF when able. Negative for chest pain and shortness of breath Fever: no Gastrointestinal:negative for nausea and vomiting Patient with history of dementia, unable to provide a accurate history.  Objective: Vital signs in last 24 hours: Temp:  [97.8 F (36.6 C)-99.4 F (37.4 C)] 97.8 F (36.6 C) (10/22 0820) Pulse Rate:  [51-97] 52 (10/22 0820) Resp:  [16-18] 16 (10/22 0820) BP: (108-172)/(72-82) 149/76 (10/22 0820) SpO2:  [94 %-100 %] 94 % (10/22 0820)  Intake/Output from previous day:  Intake/Output Summary (Last 24 hours) at 12/14/2020 1113 Last data filed at 12/14/2020 1017 Gross per 24 hour  Intake 100 ml  Output 1150 ml  Net -1050 ml    Intake/Output this shift: Total I/O In: -  Out: 200 [Urine:200]  Labs: Recent Labs    12/12/20 0417 12/13/20 0317  HGB 12.0* 10.8*   Recent Labs    12/12/20 0417 12/13/20 0317  WBC 10.3 9.5  RBC 4.08* 3.71*  HCT 35.6* 32.3*  PLT 175 172   Recent Labs    12/12/20 0417 12/13/20 0317  NA 137 137  K 4.2 4.1  CL 107 109  CO2 24 23  BUN 31* 34*  CREATININE 1.57* 1.45*  GLUCOSE 98 120*  CALCIUM 8.7* 8.4*   No results for input(s): LABPT, INR in the last 72 hours.    EXAM General - Patient is sleeping well this morning.   Extremity - ABD soft Neurovascular intact Dorsiflexion/Plantar flexion intact Incision: scant drainage No cellulitis present Compartment soft Dressing/Incision - Mild bloody drainage noted to the left hip. Motor Function - intact, moving foot and toes well on exam.  Abdomen soft with normal bowel sounds this morning.  Past Medical History:  Diagnosis Date   Arthritis    Benign prostate hyperplasia    Cataracts, bilateral    Coarse  tremors    L>R hands   COPD (chronic obstructive pulmonary disease) (HCC)    first stages   Coronary artery disease    Dizziness    occasional   Gastric ulcer    in the 60's   GERD (gastroesophageal reflux disease)    takes Omeprazole every other day   Hard of hearing    doesn't wear hearing aids   History of colon polyps    History of kidney stones    still one on the left side, low   Hyperlipidemia    takes Simvastatin daily   Hypertension    stopped BP meds, BP under control   Macular degeneration    Myocardial infarction [facility]) 2009   Peripheral neuropathy    takes Gabapentin daily   Pneumonia    hx of in the 60's   Sleep apnea    Assessment/Plan: 4 Days Post-Op Procedure(s) (LRB): ARTHROPLASTY BIPOLAR HIP (HEMIARTHROPLASTY) (Left) Principal Problem:   Closed displaced fracture of left femoral neck (HCC) Active Problems:   Dementia due to Parkinson's disease without behavioral disturbance (HCC)   Hypertension   Coronary artery disease involving coronary bypass graft of native heart without angina pectoris   CKD (chronic kidney disease) stage 3, GFR 30-59 ml/min (HCC)   At high risk for falls   Malnutrition of moderate degree  Estimated body mass index is 25.1 kg/m as calculated from the following:   Height as of this  encounter: 5\' 9"  (1.753 m).   Weight as of this encounter: 77.1 kg. Advance diet Up with therapy D/C IV fluids when tolerating po intake. Vital signs are stable this morning. Afebrile Up with therapy.  Plan is for SNF upon discharge pending placement  Upon discharge, continue Lovenox 40mg  daily for 14 days. Staples can be removed by SNF on 12/24/20.  Follow-up with [facility] orthopaedics in 6 weeks.  DVT Prophylaxis - Lovenox and Foot Pumps Weight-Bearing as tolerated to left leg  T. Rachelle Hora, PA-C [facility] Orthopaedic Surgery 12/14/2020, 11:13 AM

## 2020-12-14 NOTE — Progress Notes (Signed)
Physical Therapy Treatment Patient Details Name: Jason Cross. MRN: [id number] DOB: 06-01-1928 Today's Date: 12/14/2020   History of Present Illness Jason Cross. is a 87 y.o. male with medical history significant for dementia, Parkinson's disease, coronary artery disease status post CABG, peripheral neuropathy, GERD who presents to the ER via EMS for evaluation following a fall at home.    PT Comments    Pt was asleep in long sitting upon arriving. He easily awakes and is conversational through. Is only oriented to self and hospital. Unaware for reason being in hospital or time/day. He was however able to follow simple one step commands throughout with increased time required to process. Pt was able to roll R to short sit with assistance prior to performing STS 4 x EOB. Pt had Bms upon standing several times. He was however able to advance to taking some antalgic side steps from FOB to [facility]. Max vcs and tcs throughout. Pt will greatly benefit form SNF at DC to address deficits while maximizing independence with ADLs. Support family in room throughout session. RN aware of pt's abilities.    Recommendations for follow up therapy are one component of a multi-disciplinary discharge planning process, led by the attending physician.  Recommendations may be updated based on patient status, additional functional criteria and insurance authorization.  Follow Up Recommendations  SNF;Supervision/Assistance - 24 hour     Equipment Recommendations  Other (comment) (defer to next level of care)       Precautions / Restrictions Precautions Precautions: Fall;Posterior Hip Precaution Booklet Issued: Yes (comment) Restrictions Weight Bearing Restrictions: Yes LLE Weight Bearing: Weight bearing as tolerated     Mobility  Bed Mobility Overal bed mobility: Needs Assistance Bed Mobility: Supine to Sit;Sidelying to Sit;Rolling;Sit to Supine Rolling: Min assist;Mod assist Sidelying to  sit: Mod assist Supine to sit: Mod assist Sit to supine: Max assist   General bed mobility comments: mod assist to exit R sid eof bed however max assist to return after performing OOB activity    Transfers Overall transfer level: Needs assistance Equipment used: Rolling walker (2 wheeled) Transfers: Sit to/from Stand Sit to Stand: Min assist;Mod assist;From elevated surface         General transfer comment: pt did perform STS ~ 4 x from EOB. vcs throughout for technique improvements. poor standing tolerance overall but did progress to take a few small side steps from FOB to [facility]. did not progress away form EOB due to multiple BMs upon standing.  Ambulation/Gait Ambulation/Gait assistance: Mod assist;Max assist Gait Distance (Feet): 3 Feet Assistive device: Rolling walker (2 wheeled) Gait Pattern/deviations: Trunk flexed Gait velocity: decreased   General Gait Details: pt did advance to taking several small steps laterally to progress form FOB towards HOB. limited ability to tolerate wt on LLE to advance RLE. Constant Max vcs for technique and sequencing      Balance Overall balance assessment: Needs assistance Sitting-balance support: Bilateral upper extremity supported;Feet supported Sitting balance-Leahy Scale: Fair Sitting balance - Comments: sat EOB x > 20 minutes throughout session with close supervision   Standing balance support: Bilateral upper extremity supported;During functional activity Standing balance-Leahy Scale: Poor Standing balance comment: Requires B UE assist for transfers       Cognition Arousal/Alertness: Awake/alert Behavior During Therapy: WFL for tasks assessed/performed Overall Cognitive Status: History of cognitive impairments - at baseline      General Comments: Pt is alert and conversational however only oriented to self and being in hospital. "  I think I'm here because I hit my head."             Pertinent Vitals/Pain Pain Assessment:   (c/o head ache only) Faces Pain Scale: Hurts little more Pain Descriptors / Indicators: Discomfort;Grimacing;Guarding;Operative site guarding Pain Intervention(s): Limited activity within patient's tolerance;Monitored during session;Repositioned     PT Goals (current goals can now be found in the care plan section) Acute Rehab PT Goals Patient Stated Goal: none stated Progress towards PT goals: Progressing toward goals    Frequency    7X/week      PT Plan Current plan remains appropriate       AM-PAC PT "6 Clicks" Mobility   Outcome Measure  Help needed turning from your back to your side while in a flat bed without using bedrails?: A Lot Help needed moving from lying on your back to sitting on the side of a flat bed without using bedrails?: A Lot Help needed moving to and from a bed to a chair (including a wheelchair)?: A Lot Help needed standing up from a chair using your arms (e.g., wheelchair or bedside chair)?: A Lot Help needed to walk in hospital room?: A Lot Help needed climbing 3-5 steps with a railing? : Total 6 Click Score: 11    End of Session   Activity Tolerance: Patient tolerated treatment well;Patient limited by fatigue;Patient limited by pain;Other (comment) (cognition/ multiple BMs  greatly impacts session progress) Patient left: in bed;with bed alarm set;with family/visitor present;with call bell/phone within reach Nurse Communication: Mobility status PT Visit Diagnosis: Unsteadiness on feet (R26.81);History of falling (Z91.81);Other abnormalities of gait and mobility (R26.89);Repeated falls (R29.6);Muscle weakness (generalized) (M62.81);Pain Pain - Right/Left: Left Pain - part of body: Hip     Time: 4239-5320 PT Time Calculation (min) (ACUTE ONLY): 38 min  Charges:  $Therapeutic Activity: 38-52 mins                    Julaine Fusi PTA 12/14/20, 3:57 PM

## 2020-12-15 DIAGNOSIS — S72002A Fracture of unspecified part of neck of left femur, initial encounter for closed fracture: Secondary | ICD-10-CM | POA: Diagnosis not present

## 2020-12-15 NOTE — Progress Notes (Signed)
Physical Therapy Treatment Patient Details Name: Jason Cross. MRN: [id number] DOB: 20-Jul-1928 Today's Date: 12/15/2020   History of Present Illness Juanantonio W Ludie Pavlik. is a 87 y.o. male with medical history significant for dementia, Parkinson's disease, coronary artery disease status post CABG, peripheral neuropathy, GERD who presents to the ER via EMS for evaluation following a fall at home.    PT Comments    Pt up to commode and to chair with nursing staff just prior to arrival.  Heavy +2 assist with nursing.  PT generally fatigued so further mobility was deferred but he participated in exercises in chair with heavy cues.  Recommendations for follow up therapy are one component of a multi-disciplinary discharge planning process, led by the attending physician.  Recommendations may be updated based on patient status, additional functional criteria and insurance authorization.  Follow Up Recommendations  SNF;Supervision/Assistance - 24 hour     Equipment Recommendations  Other (comment)    Recommendations for Other Services       Precautions / Restrictions Precautions Precautions: Fall;Posterior Hip Precaution Booklet Issued: Yes (comment) Restrictions Weight Bearing Restrictions: Yes LLE Weight Bearing: Weight bearing as tolerated     Mobility  Bed Mobility               General bed mobility comments: pt up to commode with nursing staff and in chait just prior.  Reports heavy max/depenant +2 for transfers with nursing.    Transfers                    Ambulation/Gait                 Stairs             Wheelchair Mobility    Modified Rankin (Stroke Patients Only)       Balance                                            Cognition Arousal/Alertness: Awake/alert Behavior During Therapy: WFL for tasks assessed/performed Overall Cognitive Status: History of cognitive impairments - at baseline                                         Exercises Other Exercises Other Exercises: BLE AAROM in chiar x 10    General Comments        Pertinent Vitals/Pain Pain Assessment: Faces Faces Pain Scale: Hurts a little bit Pain Descriptors / Indicators: Discomfort;Grimacing;Guarding;Operative site guarding Pain Intervention(s): Limited activity within patient's tolerance;Monitored during session;Repositioned    Home Living                      Prior Function            PT Goals (current goals can now be found in the care plan section) Progress towards PT goals: Progressing toward goals    Frequency    7X/week      PT Plan Current plan remains appropriate    Co-evaluation              AM-PAC PT "6 Clicks" Mobility   Outcome Measure  Help needed turning from your back to your side while in a flat bed without using bedrails?: A Lot Help needed moving from lying  on your back to sitting on the side of a flat bed without using bedrails?: A Lot Help needed moving to and from a bed to a chair (including a wheelchair)?: A Lot Help needed standing up from a chair using your arms (e.g., wheelchair or bedside chair)?: A Lot Help needed to walk in hospital room?: Total Help needed climbing 3-5 steps with a railing? : Total 6 Click Score: 10    End of Session   Activity Tolerance: Patient tolerated treatment well Patient left: in chair;with chair alarm set;with call bell/phone within reach Nurse Communication: Mobility status PT Visit Diagnosis: Unsteadiness on feet (R26.81);History of falling (Z91.81);Other abnormalities of gait and mobility (R26.89);Repeated falls (R29.6);Muscle weakness (generalized) (M62.81);Pain Pain - Right/Left: Left Pain - part of body: Hip     Time: 1050-1101 PT Time Calculation (min) (ACUTE ONLY): 11 min  Charges:  $Therapeutic Exercise: 8-22 mins                    Chesley Noon, PTA 12/15/20, 12:05 PM

## 2020-12-15 NOTE — Discharge Summary (Addendum)
Physician Discharge Summary  Jason Cross. SWF:[id number] DOB: [birth year] DOA: 12/10/2020  PCP: Venita Lick, NP  Admit date: 12/10/2020 Discharge date: 12/17/2020  Pt was discharged by Dr. Lorella Nimrod on 12/17/20.  Due to facility's last minute inability to accept pt, pt actually left the hospital on 12/18/2020.  Except for the date addendum, the rest of the discharge summary is as written by Dr. Reesa Chew.  Admitted From: Home Disposition: SNF  Recommendations for Outpatient Follow-up:  Follow up with PCP in 1-2 weeks Follow-up with orthopedic surgery Please obtain BMP/CBC in one week Please follow up on the following pending results: None  Home Health: No Equipment/Devices: Rolling walker Discharge Condition: Stable CODE STATUS: Full Diet recommendation: Heart Healthy    Brief/Interim Summary: Jason Cross. is a 87 y.o. male with medical history significant for dementia, Parkinson's disease, coronary artery disease status post CABG, peripheral neuropathy, GERD who presents to the ER via EMS for evaluation following a fall at home. At baseline patient ambulates with a rolling walker.  Patient with progressive decline and frequent falls for the past 73-month. Found to have impacted left femoral neck fracture, no other acute abnormality noted. Taken to the OR and underwent left bipolar hemiarthroplasty with orthopedic surgery on 12/10/2020. PT is recommending SNF and patient is being discharged to rehab for further management. Staples to be removed at skilled nursing facility on 12/24/2020.  He will follow-up with [facility] orthopedic surgery in about 4 to 6 weeks.  He was also given Lovenox for 2 weeks for DVT prophylaxis.  Patient was unable to void after removal of Foley catheter, post surgically, Foley was replaced and he will follow-up with his urologist for further recommendations as an outpatient.  Patient did not develop fever a day after surgery.  Most  likely a SIRS response.  He was initially started on ceftriaxone, urine and blood cultures negative so antibiotics were discontinued.  Patient did become febrile again at 100, Keflex was restarted and he will complete a 5-day course now.  Patient was having sundowning effect/delirium during the course of hospitalization.  He was started on Seroquel with improvement in his symptoms.  Seroquel later discontinued at wife's request as it was causing excessive daytime somnolence.  Patient has an diagnosis of Parkinson's disease per chart review but not on any medication.  He also has underlying dementia and continue home dose of Aricept.  Patient also has an history of CKD stage IIIb and creatinine currently stable.  Hypertension well-controlled on home dose of Cozaar.  Patient is high risk for deterioration and death based on age and prior comorbidities with recent femoral neck fracture.  Will continue the rest of his home medications and follow-up with his providers.  Discharge Diagnoses:  Principal Problem:   Closed displaced fracture of left femoral neck (HCC) Active Problems:   Dementia due to Parkinson's disease without behavioral disturbance (HCC)   Hypertension   Coronary artery disease involving coronary bypass graft of native heart without angina pectoris   CKD (chronic kidney disease) stage 3, GFR 30-59 ml/min (HCC)   At high risk for falls   Malnutrition of moderate degree   Discharge Instructions  Discharge Instructions     Diet - low sodium heart healthy   Complete by: As directed    Discharge wound care:   Complete by: As directed    Please reinforce dressing as needed. Remove staples on 12/24/2020   Increase activity slowly   Complete by: As directed  Allergies as of 12/17/2020       Reactions   Contrast Media [iodinated Diagnostic Agents] Hives, Itching, Rash   Codeine Nausea And Vomiting        Medication List     STOP taking these medications     silodosin 8 MG Caps capsule Commonly known as: RAPAFLO       TAKE these medications    acetaminophen 500 MG tablet Commonly known as: TYLENOL Take 1-2 tablets (500-1,000 mg total) by mouth every 6 (six) hours as needed. What changed: how much to take   aspirin EC 81 MG tablet Take 81 mg by mouth every morning.   cephALEXin 500 MG capsule Commonly known as: KEFLEX Take 1 capsule (500 mg total) by mouth every 8 (eight) hours for 5 days.   docusate sodium 100 MG capsule Commonly known as: COLACE Take 1 capsule (100 mg total) by mouth 2 (two) times daily.   donepezil 10 MG tablet Commonly known as: ARICEPT Take 1 tablet (10 mg total) by mouth at bedtime.   enoxaparin 40 MG/0.4ML injection Commonly known as: LOVENOX Inject 0.4 mLs (40 mg total) into the skin daily.   feeding supplement Liqd Take 237 mLs by mouth 2 (two) times daily between meals.   gabapentin 300 MG capsule Commonly known as: NEURONTIN Take 300 mg by mouth 4 (four) times daily.   HYDROcodone-acetaminophen 5-325 MG tablet Commonly known as: NORCO/VICODIN Take 1 tablet by mouth every 8 (eight) hours as needed for severe pain.   losartan 25 MG tablet Commonly known as: COZAAR Take 25 mg by mouth daily.   magnesium hydroxide 400 MG/5ML suspension Commonly known as: MILK OF MAGNESIA Take 30 mLs by mouth daily as needed for mild constipation.   melatonin 1 MG Tabs tablet Take 1 tablet (1 mg total) by mouth at bedtime as needed.   methocarbamol 500 MG tablet Commonly known as: ROBAXIN Take 1 tablet (500 mg total) by mouth every 6 (six) hours as needed for muscle spasms.   omeprazole 40 MG capsule Commonly known as: PRILOSEC Take 40 mg by mouth daily.   ondansetron 4 MG tablet Commonly known as: ZOFRAN Take 1 tablet (4 mg total) by mouth every 6 (six) hours as needed for nausea.   PRESERVISION AREDS 2 PO Take 2 tablets by mouth daily.   tamsulosin 0.4 MG Caps capsule Commonly known as:  FLOMAX Take 0.4 mg by mouth every evening.               Discharge Care Instructions  (From admission, onward)           Start     Ordered   12/17/20 0000  Discharge wound care:       Comments: Please reinforce dressing as needed. Remove staples on 12/24/2020   12/17/20 1035            Contact information for follow-up providers     Marnee Guarneri T, NP. Schedule an appointment as soon as possible for a visit.   Specialty: Nurse Practitioner Contact information: [place] [phone]              Contact information for after-discharge care     Destination     [facility] Preferred SNF .   Service: Skilled Chiropodist information: [address] [place] [phone]                    Allergies  Allergen Reactions   Contrast Media [Iodinated Diagnostic Agents] Hives, Itching and Rash   Codeine Nausea And Vomiting    Consultations: Orthopedic surgery  Procedures/Studies: DG Chest 1 View  Result Date: 12/10/2020 CLINICAL DATA:  Pt presents from home via EMS following a fall while trying to get to the restroom. Pt endorses left hip pain - notable shortening on exam. Pt states he hit his head - denies LOC. Hx of dementia EXAM: CHEST  1 VIEW COMPARISON:  08/28/2020 FINDINGS: Coarse interstitial markings, most prominent in the left lower lung. No confluent airspace disease or overt edema. Heart size and mediastinal contours are within normal limits. Aortic Atherosclerosis (ICD10-170.0). CABG marker. No effusion.  No pneumothorax. Sternotomy wires. Lumbar fixation hardware partially visualized. Vertebral endplate spurring at multiple levels in the thoracolumbar spine. IMPRESSION: No acute cardiopulmonary disease. Electronically Signed   By: Lucrezia Europe M.D.   On: 12/10/2020 05:53   CT HEAD WO CONTRAST (5MM)  Result Date: 12/10/2020 CLINICAL DATA:  Blunt trauma post fall  EXAM: CT HEAD WITHOUT CONTRAST TECHNIQUE: Contiguous axial images were obtained from the base of the skull through the vertex without intravenous contrast. COMPARISON:  MR 08/09/2020 1 a FINDINGS: Brain: Diffuse parenchymal atrophy. Patchy areas of hypoattenuation in deep and periventricular white matter bilaterally. Negative for acute intracranial hemorrhage, mass lesion, acute infarction, midline shift, or mass-effect. Acute infarct may be inapparent on noncontrast CT. Ventricles and sulci symmetric. Vascular: Atherosclerotic and physiologic intracranial calcifications. Skull: Eccentric sclerotic slightly expansile lesion in the clivus centered to the left of midline as described on prior studies back to 12/13/2012, nonspecific but possibly chondroid lesion. Negative for fracture or acute lesion. Sinuses/Orbits: Retention cyst or polyp in the sphenoid sinus. No acute findings. Other: None IMPRESSION: 1. Negative for bleed or other acute intracranial process. 2. Atrophy and nonspecific white matter changes. 3. Sclerotic osseous lesion in the clivus as described on prior studies back to 2014. Electronically Signed   By: Lucrezia Europe M.D.   On: 12/10/2020 05:57   CT Cervical Spine Wo Contrast  Result Date: 12/10/2020 CLINICAL DATA:  [place], blunt trauma to head EXAM: CT CERVICAL SPINE WITHOUT CONTRAST TECHNIQUE: Multidetector CT imaging of the cervical spine was performed without intravenous contrast. Multiplanar CT image reconstructions were also generated. COMPARISON:  MR 12/12/2012 FINDINGS: Alignment: Normal Skull base and vertebrae: Slightly expansile mostly sclerotic lesion in the clivus centered to the left of midline, as seen on studies dating back to 2014. No fracture. Soft tissues and spinal canal: Enlarged heterogenous thyroid, incompletely visualized, with rightward displacement and mild narrowing of the trachea at the thoracic inlet. In the setting of significant comorbidities or limited life  expectancy, no follow-up recommended (ref: J Am Coll Radiol. 2015 Feb;12(2): 143-50). No prevertebral swelling or fluid. Right calcified carotid bifurcation plaque. No evident canal hematoma. Disc levels: Mild narrowing C5-6 and C6-7 with small anterior and posterior endplate spurs. Moderate narrowing C7-T1 with endplate spurring. Multilevel facet DJD. Upper chest: Visualized lung apices clear. Other: Suture material in the subcutaneous tissues and nuchal ligament of the posterior cervical spine in the midline C4-C7. IMPRESSION: 1. Negative for fracture or other acute bone abnormality. 2. Cervical spondylitic and postop changes as above 3. Sclerotic osseous lesion in the clivus, as described on prior studies back to 2014. Electronically Signed   By: Lucrezia Europe M.D.   On: 12/10/2020 06:05   CT PELVIS WO CONTRAST  Result Date: 12/10/2020 CLINICAL DATA:  87 year old male status post fall with left  hip pain, left femoral neck fracture suspected on radiographs. EXAM: CT PELVIS WITHOUT CONTRAST TECHNIQUE: Multidetector CT imaging of the pelvis was performed following the standard protocol without intravenous contrast. COMPARISON:  Left hip series 0 455 hours today. CT Abdomen and Pelvis 08/28/2020. FINDINGS: Urinary Tract: Partially visible left renal nephrocalcinosis and exophytic cysts. Stable small right renal lower pole cyst. No hydroureter. Extensive bladder diverticula appear stable. Pelvic phleboliths. Bowel: New stool ball in the rectum, about 6 cm diameter with chronic retained stool in the sigmoid. But visible large and small bowel loops are nondilated. Normal gas-filled appendix on series 3, image 42. Vascular/Lymphatic: Aortoiliac calcified atherosclerosis. Vascular patency is not evaluated in the absence of IV contrast. No lymphadenopathy identified. Reproductive:  Negative. Other:  No pelvic free fluid.  No pelvic sidewall hematoma. Musculoskeletal: Chronic lumbar degeneration, decompression and fusion.  Visible lumbar hardware appears stable and intact. Sacrum and SI joints appear stable and intact. Pelvis and proximal right femur appears stable and intact. Transverse fracture subcapital left femoral neck is mildly angulated and impacted (series 3, image 95). Small comminution fragment on series 4, image 69. The intertrochanteric segment remains intact. The left femoral head is normally located. IMPRESSION: 1. Confirmed left femoral neck fracture, subcapital mildly angulated, impacted, and slightly comminuted. 2. No other acute traumatic injury identified in the pelvis. 3. Increased rectal stool, consider mild fecal impaction. Chronic bladder diverticula, left nephrocalcinosis. 4. Aortic Atherosclerosis (ICD10-I70.0). Electronically Signed   By: Genevie Ann M.D.   On: 12/10/2020 07:17   DG HIP UNILAT W OR W/O PELVIS 2-3 VIEWS LEFT  Result Date: 12/10/2020 CLINICAL DATA:  Status post left total hip replacement. EXAM: DG HIP (WITH OR WITHOUT PELVIS) 2-3V LEFT COMPARISON:  Left hip radiographs earlier today FINDINGS: Sequelae of interval left hip arthroplasty are identified. No acute fracture or dislocation is identified. Postoperative gas is noted in the surrounding soft tissues, and skin staples are in place. Mild right hip osteoarthrosis is noted. IMPRESSION: Interval left hip arthroplasty. Electronically Signed   By: Logan Bores M.D.   On: 12/10/2020 18:50   DG Hip Unilat W or Wo Pelvis 2-3 Views Left  Result Date: 12/10/2020 CLINICAL DATA:  [place], left hip pain EXAM: DG HIP (WITH OR WITHOUT PELVIS) 2-3V LEFT COMPARISON:  07/18/2020 FINDINGS: Probable impacted left femoral neck fracture. No dislocation. Bony pelvis intact. Fixation hardware in the lower lumbar spine partially visualized. Degenerative disc disease L5-S1. Bilateral pelvic phleboliths. Patchy arterial calcifications. IMPRESSION: Probable impacted left femoral neck fracture, new since previous. Electronically Signed   By: Lucrezia Europe M.D.   On:  12/10/2020 05:52    Subjective: Patient was seen and examined today.  Sitting in chair comfortably and eating a sandwich.  Per wife he is doing much better today.  Wife at bedside.  Discharge Exam: Vitals:   12/17/20 0427 12/17/20 0808  BP: 118/71 118/75  Pulse: 61 84  Resp: 18 16  Temp: 98.9 F (37.2 C) 98.6 F (37 C)  SpO2: 93% 93%   Vitals:   12/16/20 1612 12/16/20 2045 12/17/20 0427 12/17/20 0808  BP: 137/63 123/75 118/71 118/75  Pulse: 76 83 61 84  Resp: 18 20 18 16   Temp: 99.7 F (37.6 C) 98 F (36.7 C) 98.9 F (37.2 C) 98.6 F (37 C)  TempSrc:      SpO2: 95% 96% 93% 93%  Weight:      Height:        General: Pt is alert, awake, not in acute  distress Cardiovascular: RRR, S1/S2 +, no rubs, no gallops Respiratory: CTA bilaterally, no wheezing, no rhonchi Abdominal: Soft, NT, ND, bowel sounds + Extremities: no edema, no cyanosis   The results of significant diagnostics from this hospitalization (including imaging, microbiology, ancillary and laboratory) are listed below for reference.    Microbiology: Recent Results (from the past 240 hour(s))  Resp Panel by RT-PCR (Flu A&B, Covid) Nasopharyngeal Swab     Status: None   Collection Time: 12/10/20  6:56 AM   Specimen: Nasopharyngeal Swab; Nasopharyngeal(NP) swabs in vial transport medium  Result Value Ref Range Status   SARS Coronavirus 2 by RT PCR NEGATIVE NEGATIVE Final    Comment: (NOTE) SARS-CoV-2 target nucleic acids are NOT DETECTED.  The SARS-CoV-2 RNA is generally detectable in upper respiratory specimens during the acute phase of infection. The lowest concentration of SARS-CoV-2 viral copies this assay can detect is 138 copies/mL. A negative result does not preclude SARS-Cov-2 infection and should not be used as the sole basis for treatment or other patient management decisions. A negative result may occur with  improper specimen collection/handling, submission of specimen other than nasopharyngeal  swab, presence of viral mutation(s) within the areas targeted by this assay, and inadequate number of viral copies(<138 copies/mL). A negative result must be combined with clinical observations, patient history, and epidemiological information. The expected result is Negative.  Fact Sheet for Patients:  EntrepreneurPulse.com.au  Fact Sheet for Healthcare Providers:  IncredibleEmployment.be  This test is no t yet approved or cleared by the [place] FDA and  has been authorized for detection and/or diagnosis of SARS-CoV-2 by FDA under an Emergency Use Authorization (EUA). This EUA will remain  in effect (meaning this test can be used) for the duration of the COVID-19 declaration under Section 564(b)(1) of the Act, 21 U.S.C.section 360bbb-3(b)(1), unless the authorization is terminated  or revoked sooner.       Influenza A by PCR NEGATIVE NEGATIVE Final   Influenza B by PCR NEGATIVE NEGATIVE Final    Comment: (NOTE) The Xpert Xpress SARS-CoV-2/FLU/RSV plus assay is intended as an aid in the diagnosis of influenza from Nasopharyngeal swab specimens and should not be used as a sole basis for treatment. Nasal washings and aspirates are unacceptable for Xpert Xpress SARS-CoV-2/FLU/RSV testing.  Fact Sheet for Patients: EntrepreneurPulse.com.au  Fact Sheet for Healthcare Providers: IncredibleEmployment.be  This test is not yet approved or cleared by the [place] FDA and has been authorized for detection and/or diagnosis of SARS-CoV-2 by FDA under an Emergency Use Authorization (EUA). This EUA will remain in effect (meaning this test can be used) for the duration of the COVID-19 declaration under Section 564(b)(1) of the Act, 21 U.S.C. section 360bbb-3(b)(1), unless the authorization is terminated or revoked.  Performed at [facility], [address] [place], [place] [address]   CULTURE,  BLOOD (ROUTINE X 2) w Reflex to ID Panel     Status: None   Collection Time: 12/12/20 11:00 AM   Specimen: BLOOD  Result Value Ref Range Status   Specimen Description BLOOD [facility]  Final   Special Requests BOTTLES DRAWN AEROBIC AND ANAEROBIC BCAV  Final   Culture   Final    NO GROWTH 5 DAYS Performed at [facility], [address] [place], [place] [address]    Report Status 12/17/2020 FINAL  Final  CULTURE, BLOOD (ROUTINE X 2) w Reflex to ID Panel     Status: None   Collection Time: 12/12/20 12:02 PM  Specimen: BLOOD  Result Value Ref Range Status   Specimen Description BLOOD BLOOD LEFT WRIST  Final   Special Requests   Final    BOTTLES DRAWN AEROBIC AND ANAEROBIC Blood Culture adequate volume   Culture   Final    NO GROWTH 5 DAYS Performed at [facility], [address] [place], [place] [address]    Report Status 12/17/2020 FINAL  Final  Urine Culture     Status: None   Collection Time: 12/12/20  1:18 PM   Specimen: In/Out Cath Urine  Result Value Ref Range Status   Specimen Description   Final    IN/OUT CATH URINE Performed at [facility], [address], [place], [place] [address]    Special Requests   Final    NONE Performed at [facility], [address] [place], [place] [address]    Culture   Final    NO GROWTH Performed at [facility], [place] [address], [place], [place] [address]    Report Status 12/14/2020 FINAL  Final  Resp Panel by RT-PCR (Flu A&B, Covid) Nasopharyngeal Swab     Status: None   Collection Time: 12/16/20  4:16 PM   Specimen: Nasopharyngeal Swab; Nasopharyngeal(NP) swabs in vial transport medium  Result Value Ref Range Status   SARS Coronavirus 2 by RT PCR NEGATIVE NEGATIVE Final    Comment: (NOTE) SARS-CoV-2 target nucleic acids are NOT DETECTED.  The SARS-CoV-2 RNA is generally detectable in upper respiratory specimens during the acute phase of infection. The lowest concentration of  SARS-CoV-2 viral copies this assay can detect is 138 copies/mL. A negative result does not preclude SARS-Cov-2 infection and should not be used as the sole basis for treatment or other patient management decisions. A negative result may occur with  improper specimen collection/handling, submission of specimen other than nasopharyngeal swab, presence of viral mutation(s) within the areas targeted by this assay, and inadequate number of viral copies(<138 copies/mL). A negative result must be combined with clinical observations, patient history, and epidemiological information. The expected result is Negative.  Fact Sheet for Patients:  EntrepreneurPulse.com.au  Fact Sheet for Healthcare Providers:  IncredibleEmployment.be  This test is no t yet approved or cleared by the [place] FDA and  has been authorized for detection and/or diagnosis of SARS-CoV-2 by FDA under an Emergency Use Authorization (EUA). This EUA will remain  in effect (meaning this test can be used) for the duration of the COVID-19 declaration under Section 564(b)(1) of the Act, 21 U.S.C.section 360bbb-3(b)(1), unless the authorization is terminated  or revoked sooner.       Influenza A by PCR NEGATIVE NEGATIVE Final   Influenza B by PCR NEGATIVE NEGATIVE Final    Comment: (NOTE) The Xpert Xpress SARS-CoV-2/FLU/RSV plus assay is intended as an aid in the diagnosis of influenza from Nasopharyngeal swab specimens and should not be used as a sole basis for treatment. Nasal washings and aspirates are unacceptable for Xpert Xpress SARS-CoV-2/FLU/RSV testing.  Fact Sheet for Patients: EntrepreneurPulse.com.au  Fact Sheet for Healthcare Providers: IncredibleEmployment.be  This test is not yet approved or cleared by the [place] FDA and has been authorized for detection and/or diagnosis of SARS-CoV-2 by FDA under an Emergency Use  Authorization (EUA). This EUA will remain in effect (meaning this test can be used) for the duration of the COVID-19 declaration under Section 564(b)(1) of the Act, 21 U.S.C. section 360bbb-3(b)(1), unless the authorization is terminated or revoked.  Performed at [facility], [place], [place] [address]      Labs: BNP (last 3 results) No results for input(s): BNP in the last 8760 hours. Basic Metabolic Panel: Recent Labs  Lab 12/11/20 0347 12/12/20 0417 12/13/20 0317 12/17/20 0547  NA 137 137 137 135  K 4.4 4.2 4.1 4.2  CL 107 107 109 104  CO2 23 24 23 25   GLUCOSE 124* 98 120* 111*  BUN 23 31* 34* 33*  CREATININE 1.50* 1.57* 1.45* 1.37*  CALCIUM 8.6* 8.7* 8.4* 8.5*   Liver Function Tests: No results for input(s): AST, ALT, ALKPHOS, BILITOT, PROT, ALBUMIN in the last 168 hours. No results for input(s): LIPASE, AMYLASE in the last 168 hours. No results for input(s): AMMONIA in the last 168 hours. CBC: Recent Labs  Lab 12/11/20 0347 12/12/20 0417 12/13/20 0317 12/16/20 0910  WBC 11.6* 10.3 9.5 9.3  NEUTROABS  --   --   --  6.7  HGB 12.3* 12.0* 10.8* 10.8*  HCT 36.4* 35.6* 32.3* 32.7*  MCV 87.3 87.3 87.1 85.6  PLT 187 175 172 230   Cardiac Enzymes: No results for input(s): CKTOTAL, CKMB, CKMBINDEX, TROPONINI in the last 168 hours. BNP: Invalid input(s): POCBNP CBG: Recent Labs  Lab 12/12/20 2214  GLUCAP 125*   D-Dimer No results for input(s): DDIMER in the last 72 hours. Hgb A1c No results for input(s): HGBA1C in the last 72 hours. Lipid Profile No results for input(s): CHOL, HDL, LDLCALC, TRIG, CHOLHDL, LDLDIRECT in the last 72 hours. Thyroid function studies No results for input(s): TSH, T4TOTAL, T3FREE, THYROIDAB in the last 72 hours.  Invalid input(s): FREET3 Anemia work up No results for input(s): VITAMINB12, FOLATE, FERRITIN, TIBC, IRON, RETICCTPCT in the last 72 hours. Urinalysis    Component Value Date/Time    COLORURINE YELLOW (A) 12/12/2020 1318   APPEARANCEUR CLEAR (A) 12/12/2020 1318   APPEARANCEUR Clear 06/28/2020 1532   LABSPEC 1.020 12/12/2020 1318   PHURINE 5.0 12/12/2020 1318   GLUCOSEU NEGATIVE 12/12/2020 1318   HGBUR NEGATIVE 12/12/2020 1318   [place] 12/12/2020 1318   BILIRUBINUR Negative 06/28/2020 1532   KETONESUR 5 (A) 12/12/2020 1318   PROTEINUR 30 (A) 12/12/2020 1318   UROBILINOGEN 0.2 03/26/2013 1331   NITRITE NEGATIVE 12/12/2020 1318   LEUKOCYTESUR MODERATE (A) 12/12/2020 1318   Sepsis Labs Invalid input(s): PROCALCITONIN,  WBC,  LACTICIDVEN Microbiology Recent Results (from the past 240 hour(s))  Resp Panel by RT-PCR (Flu A&B, Covid) Nasopharyngeal Swab     Status: None   Collection Time: 12/10/20  6:56 AM   Specimen: Nasopharyngeal Swab; Nasopharyngeal(NP) swabs in vial transport medium  Result Value Ref Range Status   SARS Coronavirus 2 by RT PCR NEGATIVE NEGATIVE Final    Comment: (NOTE) SARS-CoV-2 target nucleic acids are NOT DETECTED.  The SARS-CoV-2 RNA is generally detectable in upper respiratory specimens during the acute phase of infection. The lowest concentration of SARS-CoV-2 viral copies this assay can detect is 138 copies/mL. A negative result does not preclude SARS-Cov-2 infection and should not be used as the sole basis for treatment or other patient management decisions. A negative result may occur with  improper specimen collection/handling, submission of specimen other than nasopharyngeal swab, presence of viral mutation(s) within the areas targeted by this assay, and inadequate number of viral copies(<138 copies/mL). A negative result must be combined with clinical observations, patient history, and epidemiological information. The expected result is Negative.  Fact Sheet for Patients:  EntrepreneurPulse.com.au  Fact Sheet for Healthcare Providers:  IncredibleEmployment.be  This test is no  t yet approved or cleared by the [place] and  has been authorized for detection and/or diagnosis of SARS-CoV-2 by FDA under an Emergency Use Authorization (EUA). This EUA will remain  in effect (meaning this test can be used) for the duration of the COVID-19 declaration under Section 564(b)(1) of the Act, 21 U.S.C.section 360bbb-3(b)(1), unless the authorization is terminated  or revoked sooner.       Influenza A by PCR NEGATIVE NEGATIVE Final   Influenza B by PCR NEGATIVE NEGATIVE Final    Comment: (NOTE) The Xpert Xpress SARS-CoV-2/FLU/RSV plus assay is intended as an aid in the diagnosis of influenza from Nasopharyngeal swab specimens and should not be used as a sole basis for treatment. Nasal washings and aspirates are unacceptable for Xpert Xpress SARS-CoV-2/FLU/RSV testing.  Fact Sheet for Patients: EntrepreneurPulse.com.au  Fact Sheet for Healthcare Providers: IncredibleEmployment.be  This test is not yet approved or cleared by the [place] FDA and has been authorized for detection and/or diagnosis of SARS-CoV-2 by FDA under an Emergency Use Authorization (EUA). This EUA will remain in effect (meaning this test can be used) for the duration of the COVID-19 declaration under Section 564(b)(1) of the Act, 21 U.S.C. section 360bbb-3(b)(1), unless the authorization is terminated or revoked.  Performed at [facility], [address] [place], [place] [address]   CULTURE, BLOOD (ROUTINE X 2) w Reflex to ID Panel     Status: None   Collection Time: 12/12/20 11:00 AM   Specimen: BLOOD  Result Value Ref Range Status   Specimen Description BLOOD [facility]  Final   Special Requests BOTTLES DRAWN AEROBIC AND ANAEROBIC BCAV  Final   Culture   Final    NO GROWTH 5 DAYS Performed at [facility], [address] [place], [place] [address]    Report Status 12/17/2020 FINAL  Final  CULTURE, BLOOD (ROUTINE X 2) w  Reflex to ID Panel     Status: None   Collection Time: 12/12/20 12:02 PM   Specimen: BLOOD  Result Value Ref Range Status   Specimen Description BLOOD BLOOD LEFT WRIST  Final   Special Requests   Final    BOTTLES DRAWN AEROBIC AND ANAEROBIC Blood Culture adequate volume   Culture   Final    NO GROWTH 5 DAYS Performed at [facility], [address], [place], [place] [address]    Report Status 12/17/2020 FINAL  Final  Urine Culture     Status: None   Collection Time: 12/12/20  1:18 PM   Specimen: In/Out Cath Urine  Result Value Ref Range Status   Specimen Description   Final    IN/OUT CATH URINE Performed at [facility], [address], [place], [place] [address]    Special Requests   Final    NONE Performed at [facility], [address] [place], [place] [address]    Culture   Final    NO GROWTH Performed at [facility], [place] [address], [place], [place] [address]    Report Status 12/14/2020 FINAL  Final  Resp Panel by RT-PCR (Flu A&B, Covid) Nasopharyngeal Swab     Status: None   Collection Time: 12/16/20  4:16 PM   Specimen: Nasopharyngeal Swab; Nasopharyngeal(NP) swabs in vial transport medium  Result Value Ref Range Status   SARS Coronavirus 2 by RT PCR NEGATIVE NEGATIVE Final    Comment: (NOTE) SARS-CoV-2 target nucleic acids are NOT DETECTED.  The SARS-CoV-2 RNA is generally detectable in upper respiratory specimens during the acute phase of infection. The lowest concentration of SARS-CoV-2 viral copies this assay can detect is 138 copies/mL. A negative result does not preclude SARS-Cov-2 infection and should not be used as the sole basis for treatment or other patient management decisions. A negative result may occur with  improper specimen collection/handling, submission of specimen other than nasopharyngeal swab, presence of viral mutation(s) within the areas targeted by this assay, and inadequate number of  viral copies(<138 copies/mL). A negative result must be combined with clinical observations, patient history, and epidemiological information. The expected result is Negative.  Fact Sheet for Patients:  EntrepreneurPulse.com.au  Fact Sheet for Healthcare Providers:  IncredibleEmployment.be  This test is no t yet approved or cleared by the [place] FDA and  has been authorized for detection and/or diagnosis of SARS-CoV-2 by FDA under an Emergency Use Authorization (EUA). This EUA will remain  in effect (meaning this test can be used) for the duration of the COVID-19 declaration under Section 564(b)(1) of the Act, 21 U.S.C.section 360bbb-3(b)(1), unless the authorization is terminated  or revoked sooner.       Influenza A by PCR NEGATIVE NEGATIVE Final   Influenza B by PCR NEGATIVE NEGATIVE Final    Comment: (NOTE) The Xpert Xpress SARS-CoV-2/FLU/RSV plus assay is intended as an aid in the diagnosis of influenza from Nasopharyngeal swab specimens and should not be used as a sole basis for treatment. Nasal washings and aspirates are unacceptable for Xpert Xpress SARS-CoV-2/FLU/RSV testing.  Fact Sheet for Patients: EntrepreneurPulse.com.au  Fact Sheet for Healthcare Providers: IncredibleEmployment.be  This test is not yet approved or cleared by the [place] FDA and has been authorized for detection and/or diagnosis of SARS-CoV-2 by FDA under an Emergency Use Authorization (EUA). This EUA will remain in effect (meaning this test can be used) for the duration of the COVID-19 declaration under Section 564(b)(1) of the Act, 21 U.S.C. section 360bbb-3(b)(1), unless the authorization is terminated or revoked.  Performed at [facility], [address] [place], [place] [address]     Time coordinating discharge: Over 30 minutes  SIGNED:  Lorella Nimrod, MD  [facility] 12/17/2020, 10:36 AM  If 7PM-7AM, please contact night-coverage [email or web address]  This record has been created using Systems analyst. Errors have been sought and corrected,but may not always be located. Such creation errors do not reflect on the standard of care.

## 2020-12-15 NOTE — Progress Notes (Signed)
PROGRESS NOTE    Jason Cross.  HWE:[id number] DOB: [birth year] DOA: 12/10/2020 PCP: Venita Lick, NP   Brief Narrative: Taken from H&P. Jason Cross. is a 87 y.o. male with medical history significant for dementia, Parkinson's disease, coronary artery disease status post CABG, peripheral neuropathy, GERD who presents to the ER via EMS for evaluation following a fall at home. At baseline patient ambulates with a rolling walker.  Patient with progressive decline and frequent falls for the past 46-month. Found to have impacted left femoral neck fracture, no other acute abnormality noted. Taken to the OR and underwent left bipolar hemiarthroplasty with orthopedic surgery on 12/10/2020. PT is recommending SNF-TOC started the work-up  10/20: Patient developed fever overnight, up to 101.4.  History of UTI with pansensitive E. coli during prior admission with Foley catheter. Unable to void after removing catheter, Foley was replaced.  Will need to be discharged with Foley catheter with a close follow-up with urology as an outpatient.  Patient is high risk for deterioration and death based on age and prior comorbidities with recent femoral neck fracture.  10/21: Fever resolved, still pending insurance authorization for SNF.  Subjective: Patient was seen and examined today.  Oriented to self only.  Denies any complaints.  No family member at bedside today.  Per nursing staff he had a good night sleep after taking Seroquel.  Assessment & Plan:   Principal Problem:   Closed displaced fracture of left femoral neck (HCC) Active Problems:   Dementia due to Parkinson's disease without behavioral disturbance (HCC)   Hypertension   Coronary artery disease involving coronary bypass graft of native heart without angina pectoris   CKD (chronic kidney disease) stage 3, GFR 30-59 ml/min (HCC)   At high risk for falls   Malnutrition of moderate degree  Left femoral neck fracture.   Secondary to mechanical fall s/p bipolar hemiarthroplasty. Pain seems well controlled under current regimen.  History of frequent falls and progressively worsening weakness with history of underlying Parkinson's disease and dementia. PT is recommending SNF-insurance authorization still pending. -Continue with pain management and PT.  Fever.  Resolved.  Patient unable to explain any urinary symptoms.  UA with some leukocytosis.  Urine cultures with no growth.  Preliminary blood cultures negative Can be postoperative SIRS response.  He was started on ceftriaxone and later switched to Keflex. -Discontinue antibiotics.  Sundowning.  Patient is experiencing sundowning which is very common in his age group and with prior diagnosis of dementia.  Unable to sleep properly despite getting melatonin per wife. -Continue low-dose Seroquel at night. -Delirium precautions  History of Parkinson's disease and dementia.  Not on any medications for Parkinson's at home. -Continue home dose of Aricept  CKD stage IIIb.  Creatinine seems stable. -Monitor renal function. -Avoid nephrotoxins.  Hypertension.  Blood pressure within goal. -Continue home Cozaar  BPH/urinary retention.  Foley was replaced yesterday as patient was unable to void requiring in and out catheter.  Patient will be discharged with Foley catheter and will follow-up with his urologist closely for further recommendations. -Continue home Flomax  Objective: Vitals:   12/14/20 1552 12/14/20 1955 12/15/20 0400 12/15/20 0807  BP: 130/70 132/73 130/70 137/81  Pulse: 81 83 80 (!) 59  Resp: 16   16  Temp: 97.6 F (36.4 C) 98.6 F (37 C) 98.6 F (37 C) 98.5 F (36.9 C)  TempSrc:  Oral    SpO2: 98% 96% 97% 94%  Weight:  Height:        Intake/Output Summary (Last 24 hours) at 12/15/2020 1314 Last data filed at 12/15/2020 0900 Gross per 24 hour  Intake 240 ml  Output 1200 ml  Net -960 ml    Filed Weights   12/10/20 0445   Weight: 77.1 kg    Examination:  General.  Lethargic elderly man, in no acute distress. Pulmonary.  Lungs clear bilaterally, normal respiratory effort. CV.  Regular rate and rhythm, no JVD, rub or murmur. Abdomen.  Soft, nontender, nondistended, BS positive. CNS.  Alert and oriented to self only.  No focal neurologic deficit. Extremities.  No edema, no cyanosis, pulses intact and symmetrical. Psychiatry.  Judgment and insight appears impaired.   DVT prophylaxis: Lovenox Code Status: Full Family Communication:  Disposition Plan:  Status is: Inpatient  Remains inpatient appropriate because: Of severity of illness  Level of care: Med-Surg  All the records are reviewed and case discussed with Care Management/Social Worker. Management plans discussed with the patient, nursing and they are in agreement.  Consultants:  Orthopedic surgery  Procedures:  Antimicrobials:   Data Reviewed: I have personally reviewed following labs and imaging studies  CBC: Recent Labs  Lab 12/10/20 0437 12/11/20 0347 12/12/20 0417 12/13/20 0317  WBC 6.7 11.6* 10.3 9.5  NEUTROABS 3.7  --   --   --   HGB 12.8* 12.3* 12.0* 10.8*  HCT 39.7 36.4* 35.6* 32.3*  MCV 89.0 87.3 87.3 87.1  PLT 231 187 175 301    Basic Metabolic Panel: Recent Labs  Lab 12/10/20 0437 12/11/20 0347 12/12/20 0417 12/13/20 0317  NA 142 137 137 137  K 4.0 4.4 4.2 4.1  CL 107 107 107 109  CO2 27 23 24 23   GLUCOSE 99 124* 98 120*  BUN 25* 23 31* 34*  CREATININE 1.58* 1.50* 1.57* 1.45*  CALCIUM 9.1 8.6* 8.7* 8.4*    GFR: Estimated Creatinine Clearance: 33.9 mL/min (A) (by C-G formula based on SCr of 1.45 mg/dL (H)). Liver Function Tests: No results for input(s): AST, ALT, ALKPHOS, BILITOT, PROT, ALBUMIN in the last 168 hours. No results for input(s): LIPASE, AMYLASE in the last 168 hours. No results for input(s): AMMONIA in the last 168 hours. Coagulation Profile: Recent Labs  Lab 12/10/20 0437  INR 1.1     Cardiac Enzymes: No results for input(s): CKTOTAL, CKMB, CKMBINDEX, TROPONINI in the last 168 hours. BNP (last 3 results) No results for input(s): PROBNP in the last 8760 hours. HbA1C: No results for input(s): HGBA1C in the last 72 hours. CBG: Recent Labs  Lab 12/12/20 2214  GLUCAP 125*    Lipid Profile: No results for input(s): CHOL, HDL, LDLCALC, TRIG, CHOLHDL, LDLDIRECT in the last 72 hours. Thyroid Function Tests: No results for input(s): TSH, T4TOTAL, FREET4, T3FREE, THYROIDAB in the last 72 hours. Anemia Panel: No results for input(s): VITAMINB12, FOLATE, FERRITIN, TIBC, IRON, RETICCTPCT in the last 72 hours. Sepsis Labs: No results for input(s): PROCALCITON, LATICACIDVEN in the last 168 hours.  Recent Results (from the past 240 hour(s))  Resp Panel by RT-PCR (Flu A&B, Covid) Nasopharyngeal Swab     Status: None   Collection Time: 12/10/20  6:56 AM   Specimen: Nasopharyngeal Swab; Nasopharyngeal(NP) swabs in vial transport medium  Result Value Ref Range Status   SARS Coronavirus 2 by RT PCR NEGATIVE NEGATIVE Final    Comment: (NOTE) SARS-CoV-2 target nucleic acids are NOT DETECTED.  The SARS-CoV-2 RNA is generally detectable in upper respiratory specimens during the acute phase  of infection. The lowest concentration of SARS-CoV-2 viral copies this assay can detect is 138 copies/mL. A negative result does not preclude SARS-Cov-2 infection and should not be used as the sole basis for treatment or other patient management decisions. A negative result may occur with  improper specimen collection/handling, submission of specimen other than nasopharyngeal swab, presence of viral mutation(s) within the areas targeted by this assay, and inadequate number of viral copies(<138 copies/mL). A negative result must be combined with clinical observations, patient history, and epidemiological information. The expected result is Negative.  Fact Sheet for Patients:   EntrepreneurPulse.com.au  Fact Sheet for Healthcare Providers:  IncredibleEmployment.be  This test is no t yet approved or cleared by the [place] FDA and  has been authorized for detection and/or diagnosis of SARS-CoV-2 by FDA under an Emergency Use Authorization (EUA). This EUA will remain  in effect (meaning this test can be used) for the duration of the COVID-19 declaration under Section 564(b)(1) of the Act, 21 U.S.C.section 360bbb-3(b)(1), unless the authorization is terminated  or revoked sooner.       Influenza A by PCR NEGATIVE NEGATIVE Final   Influenza B by PCR NEGATIVE NEGATIVE Final    Comment: (NOTE) The Xpert Xpress SARS-CoV-2/FLU/RSV plus assay is intended as an aid in the diagnosis of influenza from Nasopharyngeal swab specimens and should not be used as a sole basis for treatment. Nasal washings and aspirates are unacceptable for Xpert Xpress SARS-CoV-2/FLU/RSV testing.  Fact Sheet for Patients: EntrepreneurPulse.com.au  Fact Sheet for Healthcare Providers: IncredibleEmployment.be  This test is not yet approved or cleared by the [place] FDA and has been authorized for detection and/or diagnosis of SARS-CoV-2 by FDA under an Emergency Use Authorization (EUA). This EUA will remain in effect (meaning this test can be used) for the duration of the COVID-19 declaration under Section 564(b)(1) of the Act, 21 U.S.C. section 360bbb-3(b)(1), unless the authorization is terminated or revoked.  Performed at [facility], [address] [place], [place] [address]   CULTURE, BLOOD (ROUTINE X 2) w Reflex to ID Panel     Status: None (Preliminary result)   Collection Time: 12/12/20 11:00 AM   Specimen: BLOOD  Result Value Ref Range Status   Specimen Description BLOOD [facility]  Final   Special Requests BOTTLES DRAWN AEROBIC AND ANAEROBIC BCAV  Final   Culture   Final    NO GROWTH  3 DAYS Performed at [facility], [address], [place], [place] [address]    Report Status PENDING  Incomplete  CULTURE, BLOOD (ROUTINE X 2) w Reflex to ID Panel     Status: None (Preliminary result)   Collection Time: 12/12/20 12:02 PM   Specimen: BLOOD  Result Value Ref Range Status   Specimen Description BLOOD BLOOD LEFT WRIST  Final   Special Requests   Final    BOTTLES DRAWN AEROBIC AND ANAEROBIC Blood Culture adequate volume   Culture   Final    NO GROWTH 3 DAYS Performed at [facility], [address] [place], [place] [address]    Report Status PENDING  Incomplete  Urine Culture     Status: None   Collection Time: 12/12/20  1:18 PM   Specimen: In/Out Cath Urine  Result Value Ref Range Status   Specimen Description   Final    IN/OUT CATH URINE Performed at [facility], [address] [place], [place] [address]    Special Requests   Final    NONE  Performed at [facility], [address] [place], [place] [address]    Culture   Final    NO GROWTH Performed at [facility], [place] [address], [place], [place] [address]    Report Status 12/14/2020 FINAL  Final      Radiology Studies: No results found.  Scheduled Meds:  Chlorhexidine Gluconate Cloth  6 each Topical Daily   docusate sodium  100 mg Oral BID   donepezil  10 mg Oral QHS   enoxaparin (LOVENOX) injection  40 mg Subcutaneous Q24H   feeding supplement  237 mL Oral BID BM   gabapentin  300 mg Oral QID   losartan  25 mg Oral Daily   multivitamin with minerals  1 tablet Oral Daily   pantoprazole  40 mg Oral Daily   QUEtiapine  25 mg Oral QHS   tamsulosin  0.4 mg Oral QPM   Continuous Infusions:  sodium chloride 75 mL/hr at 12/13/20 0526   methocarbamol (ROBAXIN) IV       LOS: 5 days   Time spent: 33 minutes. More than 50% of the time was spent in counseling/coordination of care  Lorella Nimrod, MD [facility]  If 7PM-7AM, please contact  night-coverage [email or web address]  12/15/2020, 1:14 PM   This record has been created using Systems analyst. Errors have been sought and corrected,but may not always be located. Such creation errors do not reflect on the standard of care.

## 2020-12-15 NOTE — TOC Progression Note (Signed)
Transition of Care (TOC) - Progression Note    Patient Details  Name: Jason Cross. MRN: [id number] Date of Birth: March 25, 1928  Transition of Care [facility]) CM/SW Contact  Izola Price, RN Phone Number: 12/15/2020, 9:52 AM  Clinical Narrative:  10/23: On 10/22: No word from [facility] as of 11:00 am. Contacted AC Tina  at [facility] again, and she would check and let me know if approved today (10/22) or 10/23. Simmie Davies RN CM           Expected Discharge Plan and Services                                                 Social Determinants of Health (SDOH) Interventions    Readmission Risk Interventions No flowsheet data found.

## 2020-12-15 NOTE — Progress Notes (Signed)
Subjective: 5 Days Post-Op Procedure(s) (LRB): ARTHROPLASTY BIPOLAR HIP (HEMIARTHROPLASTY) (Left) Patient is resting comfortably this morning. Mild pain Patient is well, and has had no acute complaints or problems Current plan is for discharge to SNF when able. Negative for chest pain and shortness of breath Fever: no Gastrointestinal:negative for nausea and vomiting Patient with history of dementia, unable to provide a accurate history.  Objective: Vital signs in last 24 hours: Temp:  [97.6 F (36.4 C)-98.6 F (37 C)] 98.5 F (36.9 C) (10/23 0807) Pulse Rate:  [59-83] 59 (10/23 0807) Resp:  [16] 16 (10/23 0807) BP: (130-137)/(70-81) 137/81 (10/23 0807) SpO2:  [94 %-98 %] 94 % (10/23 0807)  Intake/Output from previous day:  Intake/Output Summary (Last 24 hours) at 12/15/2020 0857 Last data filed at 12/14/2020 2004 Gross per 24 hour  Intake 240 ml  Output 400 ml  Net -160 ml    Intake/Output this shift: No intake/output data recorded.  Labs: Recent Labs    12/13/20 0317  HGB 10.8*   Recent Labs    12/13/20 0317  WBC 9.5  RBC 3.71*  HCT 32.3*  PLT 172   Recent Labs    12/13/20 0317  NA 137  K 4.1  CL 109  CO2 23  BUN 34*  CREATININE 1.45*  GLUCOSE 120*  CALCIUM 8.4*   No results for input(s): LABPT, INR in the last 72 hours.    EXAM General - Patient is sleeping well this morning.   Extremity - ABD soft Neurovascular intact Dorsiflexion/Plantar flexion intact Incision: scant drainage No cellulitis present Compartment soft Dressing/Incision - Mild dry bloody drainage noted to the left hip.  Motor Function - intact, moving foot and toes well on exam.  Abdomen soft with normal bowel sounds this morning.  Past Medical History:  Diagnosis Date   Arthritis    Benign prostate hyperplasia    Cataracts, bilateral    Coarse tremors    L>R hands   COPD (chronic obstructive pulmonary disease) (HCC)    first stages   Coronary artery disease     Dizziness    occasional   Gastric ulcer    in the 60's   GERD (gastroesophageal reflux disease)    takes Omeprazole every other day   Hard of hearing    doesn't wear hearing aids   History of colon polyps    History of kidney stones    still one on the left side, low   Hyperlipidemia    takes Simvastatin daily   Hypertension    stopped BP meds, BP under control   Macular degeneration    Myocardial infarction [facility]) 2009   Peripheral neuropathy    takes Gabapentin daily   Pneumonia    hx of in the 60's   Sleep apnea    Assessment/Plan: 5 Days Post-Op Procedure(s) (LRB): ARTHROPLASTY BIPOLAR HIP (HEMIARTHROPLASTY) (Left) Principal Problem:   Closed displaced fracture of left femoral neck (HCC) Active Problems:   Dementia due to Parkinson's disease without behavioral disturbance ([place])   Hypertension   Coronary artery disease involving coronary bypass graft of native heart without angina pectoris   CKD (chronic kidney disease) stage 3, GFR 30-59 ml/min (HCC)   At high risk for falls   Malnutrition of moderate degree  Estimated body mass index is 25.1 kg/m as calculated from the following:   Height as of this encounter: 5\' 9"  (1.753 m).   Weight as of this encounter: 77.1 kg. Advance diet Up with therapy D/C IV fluids  when tolerating po intake. Vital signs are stable this morning. Afebrile Up with therapy.  Plan is for SNF upon discharge pending placement  Upon discharge, continue Lovenox 40mg  daily for 14 days. Staples can be removed by SNF on 12/24/20.  Follow-up with [facility] orthopaedics in 6 weeks.  DVT Prophylaxis - Lovenox and Foot Pumps Weight-Bearing as tolerated to left leg  T. Rachelle Hora, PA-C [facility] Orthopaedic Surgery 12/15/2020, 8:57 AM

## 2020-12-15 NOTE — Plan of Care (Signed)

## 2020-12-16 ENCOUNTER — Encounter: Payer: Self-pay | Admitting: Surgery

## 2020-12-16 DIAGNOSIS — S72002A Fracture of unspecified part of neck of left femur, initial encounter for closed fracture: Secondary | ICD-10-CM | POA: Diagnosis not present

## 2020-12-16 LAB — CBC WITH DIFFERENTIAL/PLATELET
Abs Immature Granulocytes: 0.05 10*3/uL (ref 0.00–0.07)
Basophils Absolute: 0.1 10*3/uL (ref 0.0–0.1)
Basophils Relative: 1 %
Eosinophils Absolute: 0.1 10*3/uL (ref 0.0–0.5)
Eosinophils Relative: 1 %
HCT: 32.7 % — ABNORMAL LOW (ref 39.0–52.0)
Hemoglobin: 10.8 g/dL — ABNORMAL LOW (ref 13.0–17.0)
Immature Granulocytes: 1 %
Lymphocytes Relative: 17 %
Lymphs Abs: 1.6 10*3/uL (ref 0.7–4.0)
MCH: 28.3 pg (ref 26.0–34.0)
MCHC: 33 g/dL (ref 30.0–36.0)
MCV: 85.6 fL (ref 80.0–100.0)
Monocytes Absolute: 0.9 10*3/uL (ref 0.1–1.0)
Monocytes Relative: 9 %
Neutro Abs: 6.7 10*3/uL (ref 1.7–7.7)
Neutrophils Relative %: 71 %
Platelets: 230 10*3/uL (ref 150–400)
RBC: 3.82 MIL/uL — ABNORMAL LOW (ref 4.22–5.81)
RDW: 15 % (ref 11.5–15.5)
WBC: 9.3 10*3/uL (ref 4.0–10.5)
nRBC: 0 % (ref 0.0–0.2)

## 2020-12-16 LAB — RESP PANEL BY RT-PCR (FLU A&B, COVID) ARPGX2
Influenza A by PCR: NEGATIVE
Influenza B by PCR: NEGATIVE
SARS Coronavirus 2 by RT PCR: NEGATIVE

## 2020-12-16 MED ORDER — CEPHALEXIN 500 MG PO CAPS
500.0000 mg | ORAL_CAPSULE | Freq: Three times a day (TID) | ORAL | Status: DC
  Administered 2020-12-16 – 2020-12-18 (×6): 500 mg via ORAL
  Filled 2020-12-16 (×6): qty 1

## 2020-12-16 NOTE — Progress Notes (Signed)
Occupational Therapy Treatment Patient Details Name: Jason Cross. MRN: [id number] DOB: [birth year] Today's Date: 12/16/2020   History of present illness Jason Cross Jason Cross. is a 87 y.o. male with medical history significant for dementia, Parkinson's disease, coronary artery disease status post CABG, peripheral neuropathy, GERD who presents to the ER via EMS for evaluation following a fall at home.   OT comments  Jason Cross is largely non-communicative/disengaged this AM, although at one point he does state that he has pain "in my tailbone." Provided educ and Mod A re: repositioning in bed for comfort and skin integrity. Pt able to perform LB therex from supine with multimodal cues. Recommend DC to SNF for ongoing rehab.    Recommendations for follow up therapy are one component of a multi-disciplinary discharge planning process, led by the attending physician.  Recommendations may be updated based on patient status, additional functional criteria and insurance authorization.    Follow Up Recommendations  Skilled nursing-short term rehab (<3 hours/day)    Assistance Recommended at Discharge    Equipment Recommendations  None recommended by OT    Recommendations for Other Services      Precautions / Restrictions Precautions Precautions: Fall;Posterior Hip Restrictions Weight Bearing Restrictions: Yes LLE Weight Bearing: Weight bearing as tolerated       Mobility Bed Mobility Overal bed mobility: Needs Assistance Bed Mobility: Rolling Rolling: Mod assist              Transfers Overall transfer level: Needs assistance                 General transfer comment: deferred, 2/2 pt lethargy     Balance Overall balance assessment: Needs assistance Sitting-balance support: Bilateral upper extremity supported;Feet supported Sitting balance-Leahy Scale: Fair                                     ADL either performed or assessed with clinical  judgement   ADL                                               Vision       Perception     Praxis      Cognition Arousal/Alertness: Lethargic Behavior During Therapy: WFL for tasks assessed/performed Overall Cognitive Status: History of cognitive impairments - at baseline                                 General Comments: Pt very drowsy today          Exercises Other Exercises Other Exercises: Bed mobility, repositioning for comfort and skin integrity; Therex from supine; educ with spouse re: DC recs, POC   Shoulder Instructions       General Comments      Pertinent Vitals/ Pain       Faces Pain Scale: Hurts little more Breathing: occasional labored breathing, short period of hyperventilation Negative Vocalization: occasional moan/groan, low speech, negative/disapproving quality Facial Expression: sad, frightened, frown Pain Location: L hip, "tailbone" Pain Intervention(s): Repositioned;Monitored during session;Limited activity within patient's tolerance  Home Living  Prior Functioning/Environment              Frequency  Min 1X/week        Progress Toward Goals  OT Goals(current goals can now be found in the care plan section)  Progress towards OT goals: Progressing toward goals  Acute Rehab OT Goals OT Goal Formulation: With family Time For Goal Achievement: 12/25/20 Potential to Achieve Goals: Good  Plan Discharge plan remains appropriate;Frequency remains appropriate    Co-evaluation                 AM-PAC OT "6 Clicks" Daily Activity     Outcome Measure   Help from another person eating meals?: A Little Help from another person taking care of personal grooming?: A Lot Help from another person toileting, which includes using toliet, bedpan, or urinal?: A Lot Help from another person bathing (including washing, rinsing, drying)?: A Lot Help  from another person to put on and taking off regular upper body clothing?: A Lot Help from another person to put on and taking off regular lower body clothing?: A Lot 6 Click Score: 13    End of Session    OT Visit Diagnosis: Unsteadiness on feet (R26.81);Muscle weakness (generalized) (M62.81);Pain;History of falling (Z91.81) Pain - Right/Left: Left Pain - part of body: Hip   Activity Tolerance Patient limited by lethargy   Patient Left in bed;with call bell/phone within reach;with bed alarm set;with family/visitor present   Nurse Communication          Time: 1042-1100 OT Time Calculation (min): 18 min  Charges: OT General Charges $OT Visit: 1 Visit OT Treatments $Self Care/Home Management : 8-22 mins  Jason Lobo, PhD, MS, OTR/L 12/16/20, 11:56 AM

## 2020-12-16 NOTE — Progress Notes (Signed)
Subjective: 6 Days Post-Op Procedure(s) (LRB): ARTHROPLASTY BIPOLAR HIP (HEMIARTHROPLASTY) (Left) Patient is resting comfortably this morning. Mild pain Patient is well, and has had no acute complaints or problems Current plan is for discharge to SNF when able. Negative for chest pain and shortness of breath Fever: no Gastrointestinal:negative for nausea and vomiting Patient with history of dementia, unable to provide a accurate history.  Objective: Vital signs in last 24 hours: Temp:  [98 F (36.7 C)-98.5 F (36.9 C)] 98.2 F (36.8 C) (10/24 0345) Pulse Rate:  [59-82] 82 (10/24 0345) Resp:  [16-17] 17 (10/24 0345) BP: (123-137)/(71-81) 124/76 (10/24 0345) SpO2:  [94 %-97 %] 95 % (10/24 0345)  Intake/Output from previous day:  Intake/Output Summary (Last 24 hours) at 12/16/2020 0755 Last data filed at 12/16/2020 0430 Gross per 24 hour  Intake 444.34 ml  Output 2175 ml  Net -1730.66 ml    Intake/Output this shift: No intake/output data recorded.  Labs: No results for input(s): HGB in the last 72 hours.  No results for input(s): WBC, RBC, HCT, PLT in the last 72 hours.  No results for input(s): NA, K, CL, CO2, BUN, CREATININE, GLUCOSE, CALCIUM in the last 72 hours.  No results for input(s): LABPT, INR in the last 72 hours.    EXAM General - Patient is sleeping well this morning.  Does not provide a pain score this AM. Extremity - ABD soft Neurovascular intact Dorsiflexion/Plantar flexion intact Incision: scant drainage No cellulitis present Compartment soft Dressing/Incision - Mild dry bloody drainage noted to the left hip.  Motor Function - intact, moving foot and toes well on exam.  Abdomen soft with normal bowel sounds this morning.  Past Medical History:  Diagnosis Date   Arthritis    Benign prostate hyperplasia    Cataracts, bilateral    Coarse tremors    L>R hands   COPD (chronic obstructive pulmonary disease) (HCC)    first stages   Coronary  artery disease    Dizziness    occasional   Gastric ulcer    in the 60's   GERD (gastroesophageal reflux disease)    takes Omeprazole every other day   Hard of hearing    doesn't wear hearing aids   History of colon polyps    History of kidney stones    still one on the left side, low   Hyperlipidemia    takes Simvastatin daily   Hypertension    stopped BP meds, BP under control   Macular degeneration    Myocardial infarction [facility]) 2009   Peripheral neuropathy    takes Gabapentin daily   Pneumonia    hx of in the 60's   Sleep apnea    Assessment/Plan: 6 Days Post-Op Procedure(s) (LRB): ARTHROPLASTY BIPOLAR HIP (HEMIARTHROPLASTY) (Left) Principal Problem:   Closed displaced fracture of left femoral neck (HCC) Active Problems:   Dementia due to Parkinson's disease without behavioral disturbance ([place])   Hypertension   Coronary artery disease involving coronary bypass graft of native heart without angina pectoris   CKD (chronic kidney disease) stage 3, GFR 30-59 ml/min (HCC)   At high risk for falls   Malnutrition of moderate degree  Estimated body mass index is 25.1 kg/m as calculated from the following:   Height as of this encounter: 5\' 9"  (1.753 m).   Weight as of this encounter: 77.1 kg. Advance diet Up with therapy D/C IV fluids when tolerating po intake. Vital signs are stable this morning. Afebrile Up with therapy.  Plan  is for SNF upon discharge pending placement  Upon discharge, continue Lovenox 40mg  daily for 14 days. Staples can be removed by SNF on 12/24/20.  Follow-up with [facility] orthopaedics in 6 weeks.  DVT Prophylaxis - Lovenox and Foot Pumps Weight-Bearing as tolerated to left leg  J. Cameron Proud, PA-C [facility] Orthopaedic Surgery 12/16/2020, 7:55 AM

## 2020-12-16 NOTE — Progress Notes (Signed)
Physical Therapy Treatment Patient Details Name: Jason Cross. MRN: [id number] DOB: September 23, 1928 Today's Date: 12/16/2020   History of Present Illness Jason Cross. is a 87 y.o. male with medical history significant for dementia, Parkinson's disease, coronary artery disease status post CABG, peripheral neuropathy, GERD who presents to the ER via EMS for evaluation following a fall at home.    PT Comments    Lethargic.  Supine AA?PROM x 10 for comfort.  Lethargic and falling asleep during session.  R reported lethargy attributed to meds earlier.  Had more trouble transferring with nursing today and it was deferred today as he was falling asleep during ex and not able to actively assist at this time.   Recommendations for follow up therapy are one component of a multi-disciplinary discharge planning process, led by the attending physician.  Recommendations may be updated based on patient status, additional functional criteria and insurance authorization.  Follow Up Recommendations  Skilled nursing-short term rehab (<3 hours/day)     Assistance Recommended at Discharge    Equipment Recommendations  Other (comment)    Recommendations for Other Services       Precautions / Restrictions Precautions Precautions: Fall;Posterior Hip Restrictions Weight Bearing Restrictions: Yes LLE Weight Bearing: Weight bearing as tolerated     Mobility  Bed Mobility Overal bed mobility: Needs Assistance Bed Mobility: Rolling Rolling: Mod assist              Transfers Overall transfer level: Needs assistance                 General transfer comment: deferred, 2/2 pt lethargy    Ambulation/Gait                 Stairs             Wheelchair Mobility    Modified Rankin (Stroke Patients Only)       Balance Overall balance assessment: Needs assistance Sitting-balance support: Bilateral upper extremity supported;Feet supported Sitting  balance-Leahy Scale: Fair                                      Cognition Arousal/Alertness: Lethargic Behavior During Therapy: WFL for tasks assessed/performed Overall Cognitive Status: History of cognitive impairments - at baseline                                 General Comments: Pt very drowsy today        Exercises Other Exercises Other Exercises: BLE AAROM in bed x 10 Other Exercises: Bed mobility, repositioning for comfort and skin integrity; Therex from supine; educ with spouse re: DC recs, POC    General Comments        Pertinent Vitals/Pain Pain Assessment: Faces Faces Pain Scale: Hurts a little bit Breathing: occasional labored breathing, short period of hyperventilation Negative Vocalization: occasional moan/groan, low speech, negative/disapproving quality Facial Expression: sad, frightened, frown Pain Location: L hip, "tailbone" Pain Descriptors / Indicators: Grimacing Pain Intervention(s): Repositioned;Monitored during session;Limited activity within patient's tolerance    Home Living                          Prior Function            PT Goals (current goals can now be found in the care plan section) Acute  Rehab PT Goals Patient Stated Goal: none stated Progress towards PT goals: Not progressing toward goals - comment    Frequency    7X/week      PT Plan Current plan remains appropriate    Co-evaluation              AM-PAC PT "6 Clicks" Mobility   Outcome Measure  Help needed turning from your back to your side while in a flat bed without using bedrails?: Total Help needed moving from lying on your back to sitting on the side of a flat bed without using bedrails?: Total Help needed moving to and from a bed to a chair (including a wheelchair)?: Total Help needed standing up from a chair using your arms (e.g., wheelchair or bedside chair)?: Total Help needed to walk in hospital room?: Total Help  needed climbing 3-5 steps with a railing? : Total 6 Click Score: 6    End of Session   Activity Tolerance: Patient tolerated treatment well;Patient limited by lethargy Patient left: in bed;with call bell/phone within reach;with bed alarm set;with family/visitor present Nurse Communication: Mobility status PT Visit Diagnosis: Unsteadiness on feet (R26.81);History of falling (Z91.81);Other abnormalities of gait and mobility (R26.89);Repeated falls (R29.6);Muscle weakness (generalized) (M62.81);Pain Pain - Right/Left: Left Pain - part of body: Hip     Time: 5183-4373 PT Time Calculation (min) (ACUTE ONLY): 14 min  Charges:  $Therapeutic Exercise: 8-22 mins                    Chesley Noon, PTA 12/16/20, 2:41 PM

## 2020-12-16 NOTE — Care Management Important Message (Signed)
Important Message  Patient Details  Name: Jason Cross. MRN: [id number] Date of Birth: [birth year]   Medicare Important Message Given:  Yes     Juliann Pulse A Mazel Villela 12/16/2020, 11:27 AM

## 2020-12-16 NOTE — Progress Notes (Signed)
PROGRESS NOTE    Jason Cross.  NAT:[id number] DOB: August 08, 1928 DOA: 12/10/2020 PCP: Venita Lick, NP   Brief Narrative: Taken from H&P. Jason Cross. is a 87 y.o. male with medical history significant for dementia, Parkinson's disease, coronary artery disease status post CABG, peripheral neuropathy, GERD who presents to the ER via EMS for evaluation following a fall at home. At baseline patient ambulates with a rolling walker.  Patient with progressive decline and frequent falls for the past 33-month. Found to have impacted left femoral neck fracture, no other acute abnormality noted. Taken to the OR and underwent left bipolar hemiarthroplasty with orthopedic surgery on 12/10/2020. PT is recommending SNF-TOC started the work-up  10/20: Patient developed fever overnight, up to 101.4.  History of UTI with pansensitive E. coli during prior admission with Foley catheter. Unable to void after removing catheter, Foley was replaced.  Will need to be discharged with Foley catheter with a close follow-up with urology as an outpatient.  Patient is high risk for deterioration and death based on age and prior comorbidities with recent femoral neck fracture.  10/21: Fever resolved, still pending insurance authorization for SNF.  10/24: Again had 1 more episode of fever at 100.7, he was complaining of some sore throat with wife earlier and then later denied it.  No new symptoms.  Restarting Keflex for 5 days. Obtained insurance authorization but facility will take him tomorrow.  Subjective: Patient was seen and examined today.  Wife at bedside.  When asked how are you feeling, stating not good but unable to explain any symptoms.  Per wife he was complaining of some sore throat earlier in the day but then said it is okay now.  Assessment & Plan:   Principal Problem:   Closed displaced fracture of left femoral neck (HCC) Active Problems:   Dementia due to Parkinson's disease  without behavioral disturbance (HCC)   Hypertension   Coronary artery disease involving coronary bypass graft of native heart without angina pectoris   CKD (chronic kidney disease) stage 3, GFR 30-59 ml/min (HCC)   At high risk for falls   Malnutrition of moderate degree  Left femoral neck fracture.  Secondary to mechanical fall s/p bipolar hemiarthroplasty. Pain seems well controlled under current regimen.  History of frequent falls and progressively worsening weakness with history of underlying Parkinson's disease and dementia. PT is recommending SNF-insurance authorization still pending. -Continue with pain management and PT.  Fever.  1 more episode of being febrile at 100.7, patient unable to explain any urinary symptoms.  UA with some leukocytosis.  Urine cultures with no growth.  Preliminary blood cultures negative Can be postoperative SIRS response.  He was started on ceftriaxone and later switched to Keflex. -Restart Keflex-for 5 days. -Checking COVID  Sundowning.  Patient is experiencing sundowning which is very common in his age group and with prior diagnosis of dementia.  Unable to sleep properly despite getting melatonin per wife. -Seroquel is being discontinued at wife's request as she thinks it is causing increased somnolence in the morning. -Delirium precautions  History of Parkinson's disease and dementia.  Not on any medications for Parkinson's at home. -Continue home dose of Aricept  CKD stage IIIb.  Creatinine seems stable. -Monitor renal function. -Avoid nephrotoxins.  Hypertension.  Blood pressure within goal. -Continue home Cozaar  BPH/urinary retention.  Foley was replaced 10/21 as patient was unable to void requiring in and out catheter.  Patient will be discharged with Foley catheter and will  follow-up with his urologist closely for further recommendations. -Continue home Flomax  Objective: Vitals:   12/15/20 2116 12/16/20 0345 12/16/20 0829 12/16/20 1152   BP: 126/72 124/76 127/64 (!) 115/57  Pulse: 78 82 84 82  Resp: 16 17 18 18   Temp: 98.3 F (36.8 C) 98.2 F (36.8 C) (!) 100.7 F (38.2 C) 98.2 F (36.8 C)  TempSrc: Oral Oral    SpO2: 96% 95% 94% 100%  Weight:      Height:        Intake/Output Summary (Last 24 hours) at 12/16/2020 1548 Last data filed at 12/16/2020 0430 Gross per 24 hour  Intake 324.34 ml  Output 1175 ml  Net -850.66 ml    Filed Weights   12/10/20 0445  Weight: 77.1 kg    Examination:  General.  Chronically ill-appearing, frail elderly man, in no acute distress. Pulmonary.  Lungs clear bilaterally, normal respiratory effort. CV.  Regular rate and rhythm, no JVD, rub or murmur. Abdomen.  Soft, nontender, nondistended, BS positive. CNS.  Alert and oriented to self only.  No focal neurologic deficit. Extremities.  No edema, no cyanosis, pulses intact and symmetrical. Psychiatry.  Judgment and insight appears impaired.  DVT prophylaxis: Lovenox Code Status: Full Family Communication: Discussed with wife at bedside Disposition Plan:  Status is: Inpatient  Remains inpatient appropriate because: Of severity of illness  Level of care: Med-Surg  All the records are reviewed and case discussed with Care Management/Social Worker. Management plans discussed with the patient, nursing and they are in agreement.  Consultants:  Orthopedic surgery  Procedures:  Antimicrobials:   Data Reviewed: I have personally reviewed following labs and imaging studies  CBC: Recent Labs  Lab 12/10/20 0437 12/11/20 0347 12/12/20 0417 12/13/20 0317 12/16/20 0910  WBC 6.7 11.6* 10.3 9.5 9.3  NEUTROABS 3.7  --   --   --  6.7  HGB 12.8* 12.3* 12.0* 10.8* 10.8*  HCT 39.7 36.4* 35.6* 32.3* 32.7*  MCV 89.0 87.3 87.3 87.1 85.6  PLT 231 187 175 172 027    Basic Metabolic Panel: Recent Labs  Lab 12/10/20 0437 12/11/20 0347 12/12/20 0417 12/13/20 0317  NA 142 137 137 137  K 4.0 4.4 4.2 4.1  CL 107 107 107 109   CO2 27 23 24 23   GLUCOSE 99 124* 98 120*  BUN 25* 23 31* 34*  CREATININE 1.58* 1.50* 1.57* 1.45*  CALCIUM 9.1 8.6* 8.7* 8.4*    GFR: Estimated Creatinine Clearance: 33.9 mL/min (A) (by C-G formula based on SCr of 1.45 mg/dL (H)). Liver Function Tests: No results for input(s): AST, ALT, ALKPHOS, BILITOT, PROT, ALBUMIN in the last 168 hours. No results for input(s): LIPASE, AMYLASE in the last 168 hours. No results for input(s): AMMONIA in the last 168 hours. Coagulation Profile: Recent Labs  Lab 12/10/20 0437  INR 1.1    Cardiac Enzymes: No results for input(s): CKTOTAL, CKMB, CKMBINDEX, TROPONINI in the last 168 hours. BNP (last 3 results) No results for input(s): PROBNP in the last 8760 hours. HbA1C: No results for input(s): HGBA1C in the last 72 hours. CBG: Recent Labs  Lab 12/12/20 2214  GLUCAP 125*    Lipid Profile: No results for input(s): CHOL, HDL, LDLCALC, TRIG, CHOLHDL, LDLDIRECT in the last 72 hours. Thyroid Function Tests: No results for input(s): TSH, T4TOTAL, FREET4, T3FREE, THYROIDAB in the last 72 hours. Anemia Panel: No results for input(s): VITAMINB12, FOLATE, FERRITIN, TIBC, IRON, RETICCTPCT in the last 72 hours. Sepsis Labs: No results for input(s):  PROCALCITON, LATICACIDVEN in the last 168 hours.  Recent Results (from the past 240 hour(s))  Resp Panel by RT-PCR (Flu A&B, Covid) Nasopharyngeal Swab     Status: None   Collection Time: 12/10/20  6:56 AM   Specimen: Nasopharyngeal Swab; Nasopharyngeal(NP) swabs in vial transport medium  Result Value Ref Range Status   SARS Coronavirus 2 by RT PCR NEGATIVE NEGATIVE Final    Comment: (NOTE) SARS-CoV-2 target nucleic acids are NOT DETECTED.  The SARS-CoV-2 RNA is generally detectable in upper respiratory specimens during the acute phase of infection. The lowest concentration of SARS-CoV-2 viral copies this assay can detect is 138 copies/mL. A negative result does not preclude SARS-Cov-2 infection  and should not be used as the sole basis for treatment or other patient management decisions. A negative result may occur with  improper specimen collection/handling, submission of specimen other than nasopharyngeal swab, presence of viral mutation(s) within the areas targeted by this assay, and inadequate number of viral copies(<138 copies/mL). A negative result must be combined with clinical observations, patient history, and epidemiological information. The expected result is Negative.  Fact Sheet for Patients:  EntrepreneurPulse.com.au  Fact Sheet for Healthcare Providers:  IncredibleEmployment.be  This test is no t yet approved or cleared by the [place] FDA and  has been authorized for detection and/or diagnosis of SARS-CoV-2 by FDA under an Emergency Use Authorization (EUA). This EUA will remain  in effect (meaning this test can be used) for the duration of the COVID-19 declaration under Section 564(b)(1) of the Act, 21 U.S.C.section 360bbb-3(b)(1), unless the authorization is terminated  or revoked sooner.       Influenza A by PCR NEGATIVE NEGATIVE Final   Influenza B by PCR NEGATIVE NEGATIVE Final    Comment: (NOTE) The Xpert Xpress SARS-CoV-2/FLU/RSV plus assay is intended as an aid in the diagnosis of influenza from Nasopharyngeal swab specimens and should not be used as a sole basis for treatment. Nasal washings and aspirates are unacceptable for Xpert Xpress SARS-CoV-2/FLU/RSV testing.  Fact Sheet for Patients: EntrepreneurPulse.com.au  Fact Sheet for Healthcare Providers: IncredibleEmployment.be  This test is not yet approved or cleared by the [place] FDA and has been authorized for detection and/or diagnosis of SARS-CoV-2 by FDA under an Emergency Use Authorization (EUA). This EUA will remain in effect (meaning this test can be used) for the duration of the COVID-19 declaration  under Section 564(b)(1) of the Act, 21 U.S.C. section 360bbb-3(b)(1), unless the authorization is terminated or revoked.  Performed at [facility], [address] [place], [place] [address]   CULTURE, BLOOD (ROUTINE X 2) w Reflex to ID Panel     Status: None (Preliminary result)   Collection Time: 12/12/20 11:00 AM   Specimen: BLOOD  Result Value Ref Range Status   Specimen Description BLOOD [facility]  Final   Special Requests BOTTLES DRAWN AEROBIC AND ANAEROBIC BCAV  Final   Culture   Final    NO GROWTH 4 DAYS Performed at [facility], [address] [place], [place] [address]    Report Status PENDING  Incomplete  CULTURE, BLOOD (ROUTINE X 2) w Reflex to ID Panel     Status: None (Preliminary result)   Collection Time: 12/12/20 12:02 PM   Specimen: BLOOD  Result Value Ref Range Status   Specimen Description BLOOD BLOOD LEFT WRIST  Final   Special Requests   Final    BOTTLES DRAWN AEROBIC AND ANAEROBIC Blood Culture adequate volume   Culture  Final    NO GROWTH 4 DAYS Performed at [facility], [address] [place], [place] [address]    Report Status PENDING  Incomplete  Urine Culture     Status: None   Collection Time: 12/12/20  1:18 PM   Specimen: In/Out Cath Urine  Result Value Ref Range Status   Specimen Description   Final    IN/OUT CATH URINE Performed at [facility], [address] [place], [place] [address]    Special Requests   Final    NONE Performed at [facility], [address] [place], [place] [address]    Culture   Final    NO GROWTH Performed at [facility], [place] [address], [place], [place] [address]    Report Status 12/14/2020 FINAL  Final      Radiology Studies: No results found.  Scheduled Meds:  cephALEXin  500 mg Oral Q8H   Chlorhexidine Gluconate Cloth  6 each Topical Daily   docusate sodium  100 mg Oral BID   donepezil  10 mg Oral QHS   enoxaparin (LOVENOX) injection  40 mg  Subcutaneous Q24H   feeding supplement  237 mL Oral BID BM   gabapentin  300 mg Oral QID   losartan  25 mg Oral Daily   multivitamin with minerals  1 tablet Oral Daily   pantoprazole  40 mg Oral Daily   QUEtiapine  25 mg Oral QHS   tamsulosin  0.4 mg Oral QPM   Continuous Infusions:  sodium chloride Stopped (12/13/20 2213)   methocarbamol (ROBAXIN) IV       LOS: 6 days   Time spent: 35 minutes. More than 50% of the time was spent in counseling/coordination of care  Lorella Nimrod, MD [facility]  If 7PM-7AM, please contact night-coverage [email or web address]  12/16/2020, 3:48 PM   This record has been created using Systems analyst. Errors have been sought and corrected,but may not always be located. Such creation errors do not reflect on the standard of care.

## 2020-12-16 NOTE — TOC Progression Note (Addendum)
Transition of Care (TOC) - Progression Note    Patient Details  Name: Jason Cross. MRN: [id number] Date of Birth: June 03, 1928  Transition of [facility]) CM/SW [place], RN Phone Number: 12/16/2020, 1:58 PM  Clinical Narrative:  Patient to discharge to [facility], room 710, nurse to call report to [phone]. Wife notified of discharge plan voices understanding, receptive to plan. TOC barriers resolved.   3:42pm Discharge rescheduled for tomorrow, 10/25 due to delay in discharge orders. [facility] cancelled. Attending and nurse notified. Wife also notified of discharge reschedule for tomorrow.      Barriers to Discharge: Barriers Resolved  Expected Discharge Plan and Services                                                 Social Determinants of Health (SDOH) Interventions    Readmission Risk Interventions No flowsheet data found.

## 2020-12-17 DIAGNOSIS — S72002A Fracture of unspecified part of neck of left femur, initial encounter for closed fracture: Secondary | ICD-10-CM | POA: Diagnosis not present

## 2020-12-17 LAB — GLUCOSE, CAPILLARY: Glucose-Capillary: 115 mg/dL — ABNORMAL HIGH (ref 70–99)

## 2020-12-17 LAB — CULTURE, BLOOD (ROUTINE X 2)
Culture: NO GROWTH
Culture: NO GROWTH
Special Requests: ADEQUATE

## 2020-12-17 LAB — BASIC METABOLIC PANEL
Anion gap: 6 (ref 5–15)
BUN: 33 mg/dL — ABNORMAL HIGH (ref 8–23)
CO2: 25 mmol/L (ref 22–32)
Calcium: 8.5 mg/dL — ABNORMAL LOW (ref 8.9–10.3)
Chloride: 104 mmol/L (ref 98–111)
Creatinine, Ser: 1.37 mg/dL — ABNORMAL HIGH (ref 0.61–1.24)
GFR, Estimated: 49 mL/min — ABNORMAL LOW (ref >60)
Glucose, Bld: 111 mg/dL — ABNORMAL HIGH (ref 70–99)
Potassium: 4.2 mmol/L (ref 3.5–5.1)
Sodium: 135 mmol/L (ref 135–145)

## 2020-12-17 MED ORDER — ENSURE ENLIVE PO LIQD: 237.0000 mL | Freq: Two times a day (BID) | ORAL | 12 refills | Status: AC

## 2020-12-17 MED ORDER — DOCUSATE SODIUM 100 MG PO CAPS: 100.0000 mg | ORAL_CAPSULE | Freq: Two times a day (BID) | ORAL | 0 refills | Status: AC

## 2020-12-17 MED ORDER — METHOCARBAMOL 500 MG PO TABS: 500.0000 mg | ORAL_TABLET | Freq: Four times a day (QID) | ORAL | Status: AC | PRN

## 2020-12-17 MED ORDER — MAGNESIUM HYDROXIDE 400 MG/5ML PO SUSP: 30.0000 mL | Freq: Every day | ORAL | 0 refills | Status: AC | PRN

## 2020-12-17 MED ORDER — CEPHALEXIN 500 MG PO CAPS: 500.0000 mg | ORAL_CAPSULE | Freq: Three times a day (TID) | ORAL | 0 refills | Status: AC

## 2020-12-17 NOTE — Progress Notes (Signed)
Physical Therapy Treatment Patient Details Name: Jason Cross. MRN: [id number] DOB: 05/31/1928 Today's Date: 12/17/2020   History of Present Illness Keylan W Ronnald Shedden. is a 87 y.o. male with medical history significant for dementia, Parkinson's disease, coronary artery disease status post CABG, peripheral neuropathy, GERD who presents to the ER via EMS for evaluation following a fall at home.    PT Comments    Pt seen this am for OOB to recliner and assisted back to bed after ~1.5 hours of sitting up. Pt required Mod/MaxA for bed mobility and transfers today. Increased difficulty returning to bed due to incontinence of stool, impaired vision, and HOH.  Pt also with difficulty placing weight through L LE and attaining upright stance at RW during transfers. Pt declined any pain, however remains week and requires significant increase in care which wife is unable to provide at home.  Pt will benefit from SNF placement once medically cleared for d/c.     Recommendations for follow up therapy are one component of a multi-disciplinary discharge planning process, led by the attending physician.  Recommendations may be updated based on patient status, additional functional criteria and insurance authorization.  Follow Up Recommendations  Skilled nursing-short term rehab (<3 hours/day)     Assistance Recommended at Discharge    Equipment Recommendations       Recommendations for Other Services OT consult     Precautions / Restrictions Precautions Precautions: Fall Restrictions Weight Bearing Restrictions: Yes LLE Weight Bearing: Weight bearing as tolerated     Mobility  Bed Mobility Overal bed mobility: Needs Assistance Bed Mobility: Rolling Rolling: Mod assist Sidelying to sit: Mod assist Supine to sit: Mod assist Sit to supine: Max assist        Transfers Overall transfer level: Needs assistance Equipment used: Rolling walker (2 wheels) Transfers: Sit to/from  Stand Sit to Stand: Mod assist;Max assist Stand pivot transfers: Mod assist;From elevated surface         General transfer comment:  (Pt incontinent of large amounts of stool upon standing from recliner)    Ambulation/Gait             General Gait Details:  (Pt only able to side step with support of RW bed<>recliner and ModA)   Stairs             Wheelchair Mobility    Modified Rankin (Stroke Patients Only)       Balance Overall balance assessment: Needs assistance Sitting-balance support: Bilateral upper extremity supported;Feet supported Sitting balance-Leahy Scale: Fair Sitting balance - Comments:  (sat EOB x 5 minutes with supervision, feet on floor, and single UE support)   Standing balance support: Bilateral upper extremity supported;During functional activity Standing balance-Leahy Scale: Poor Standing balance comment: Requires B UE assist for transfers                            Cognition Arousal/Alertness: Awake/alert Behavior During Therapy: WFL for tasks assessed/performed Overall Cognitive Status: History of cognitive impairments - at baseline                                 General Comments:  (Pt more alert yet states he has poor vision and is HOH making it challenging for him to follow commands to complete tasks)        Exercises      General Comments General comments (  skin integrity, edema, etc.): Pt struggled with transfers today due to impaired vision, hearing loss, and difficulty coordinating desired movements      Pertinent Vitals/Pain Pain Assessment: No/denies pain    Home Living                          Prior Function            PT Goals (current goals can now be found in the care plan section) Acute Rehab PT Goals Patient Stated Goal: none stated    Frequency    7X/week      PT Plan Current plan remains appropriate    Co-evaluation              AM-PAC PT "6 Clicks"  Mobility   Outcome Measure  Help needed turning from your back to your side while in a flat bed without using bedrails?: A Lot Help needed moving from lying on your back to sitting on the side of a flat bed without using bedrails?: A Lot Help needed moving to and from a bed to a chair (including a wheelchair)?: A Lot Help needed standing up from a chair using your arms (e.g., wheelchair or bedside chair)?: A Lot Help needed to walk in hospital room?: Total Help needed climbing 3-5 steps with a railing? : Total 6 Click Score: 10    End of Session Equipment Utilized During Treatment: Gait belt Activity Tolerance: Patient tolerated treatment well Patient left: in bed;with call bell/phone within reach;with bed alarm set;with family/visitor present Nurse Communication: Mobility status PT Visit Diagnosis: Unsteadiness on feet (R26.81);History of falling (Z91.81);Other abnormalities of gait and mobility (R26.89);Repeated falls (R29.6);Muscle weakness (generalized) (M62.81);Pain Pain - Right/Left: Left Pain - part of body: Hip     Time: 1145-1240 PT Time Calculation (min) (ACUTE ONLY): 55 min  Charges:  $Therapeutic Activity: 38-52 mins $Neuromuscular Re-education: 8-22 mins                    [name], PTA    [name] 12/17/2020, 12:52 PM

## 2020-12-17 NOTE — TOC Progression Note (Signed)
Transition of Care (TOC) - Progression Note    Patient Details  Name: Jason Cross. MRN: [id number] Date of Birth: February 19, 1929  Transition of Care [facility]) CM/SW [place], LCSW Phone Number: 12/17/2020, 11:55 AM  Clinical Narrative:  One of [facility]'s patients tested positive for COVID so they are having to move patients around and this took up the open rooms they had. They are hoping to be able to accept him tomorrow. Auth expires after tomorrow. Called wife and provided update.    Barriers to Discharge: Barriers Resolved  Expected Discharge Plan and Services           Expected Discharge Date: 12/14/20                                     Social Determinants of Health (SDOH) Interventions    Readmission Risk Interventions No flowsheet data found.

## 2020-12-17 NOTE — Plan of Care (Signed)

## 2020-12-18 DIAGNOSIS — R69 Illness, unspecified: Secondary | ICD-10-CM | POA: Diagnosis not present

## 2020-12-18 DIAGNOSIS — R54 Age-related physical debility: Secondary | ICD-10-CM | POA: Diagnosis not present

## 2020-12-18 DIAGNOSIS — F0280 Dementia in other diseases classified elsewhere without behavioral disturbance: Secondary | ICD-10-CM | POA: Diagnosis not present

## 2020-12-18 DIAGNOSIS — Z741 Need for assistance with personal care: Secondary | ICD-10-CM | POA: Diagnosis not present

## 2020-12-18 DIAGNOSIS — S72002D Fracture of unspecified part of neck of left femur, subsequent encounter for closed fracture with routine healing: Secondary | ICD-10-CM | POA: Diagnosis not present

## 2020-12-18 DIAGNOSIS — I2510 Atherosclerotic heart disease of native coronary artery without angina pectoris: Secondary | ICD-10-CM | POA: Diagnosis not present

## 2020-12-18 DIAGNOSIS — Z743 Need for continuous supervision: Secondary | ICD-10-CM | POA: Diagnosis not present

## 2020-12-18 DIAGNOSIS — Z79899 Other long term (current) drug therapy: Secondary | ICD-10-CM | POA: Diagnosis not present

## 2020-12-18 DIAGNOSIS — K219 Gastro-esophageal reflux disease without esophagitis: Secondary | ICD-10-CM | POA: Diagnosis not present

## 2020-12-18 DIAGNOSIS — Z96649 Presence of unspecified artificial hip joint: Secondary | ICD-10-CM | POA: Diagnosis not present

## 2020-12-18 DIAGNOSIS — N399 Disorder of urinary system, unspecified: Secondary | ICD-10-CM | POA: Diagnosis not present

## 2020-12-18 DIAGNOSIS — M62552 Muscle wasting and atrophy, not elsewhere classified, left thigh: Secondary | ICD-10-CM | POA: Diagnosis not present

## 2020-12-18 DIAGNOSIS — I1310 Hypertensive heart and chronic kidney disease without heart failure, with stage 1 through stage 4 chronic kidney disease, or unspecified chronic kidney disease: Secondary | ICD-10-CM | POA: Diagnosis not present

## 2020-12-18 DIAGNOSIS — R339 Retention of urine, unspecified: Secondary | ICD-10-CM | POA: Diagnosis not present

## 2020-12-18 DIAGNOSIS — R404 Transient alteration of awareness: Secondary | ICD-10-CM | POA: Diagnosis not present

## 2020-12-18 DIAGNOSIS — E569 Vitamin deficiency, unspecified: Secondary | ICD-10-CM | POA: Diagnosis not present

## 2020-12-18 DIAGNOSIS — N403 Nodular prostate with lower urinary tract symptoms: Secondary | ICD-10-CM | POA: Diagnosis not present

## 2020-12-18 DIAGNOSIS — M62551 Muscle wasting and atrophy, not elsewhere classified, right thigh: Secondary | ICD-10-CM | POA: Diagnosis not present

## 2020-12-18 DIAGNOSIS — S72302A Unspecified fracture of shaft of left femur, initial encounter for closed fracture: Secondary | ICD-10-CM | POA: Diagnosis not present

## 2020-12-18 DIAGNOSIS — I959 Hypotension, unspecified: Secondary | ICD-10-CM | POA: Diagnosis not present

## 2020-12-18 DIAGNOSIS — M6259 Muscle wasting and atrophy, not elsewhere classified, multiple sites: Secondary | ICD-10-CM | POA: Diagnosis not present

## 2020-12-18 DIAGNOSIS — Z4789 Encounter for other orthopedic aftercare: Secondary | ICD-10-CM | POA: Diagnosis not present

## 2020-12-18 DIAGNOSIS — E869 Volume depletion, unspecified: Secondary | ICD-10-CM | POA: Diagnosis not present

## 2020-12-18 DIAGNOSIS — Z7401 Bed confinement status: Secondary | ICD-10-CM | POA: Diagnosis not present

## 2020-12-18 DIAGNOSIS — G20 Parkinson's disease: Secondary | ICD-10-CM | POA: Diagnosis not present

## 2020-12-18 DIAGNOSIS — I10 Essential (primary) hypertension: Secondary | ICD-10-CM | POA: Diagnosis not present

## 2020-12-18 DIAGNOSIS — M80052A Age-related osteoporosis with current pathological fracture, left femur, initial encounter for fracture: Secondary | ICD-10-CM | POA: Diagnosis not present

## 2020-12-18 DIAGNOSIS — R400 Somnolence: Secondary | ICD-10-CM | POA: Diagnosis not present

## 2020-12-18 DIAGNOSIS — M6281 Muscle weakness (generalized): Secondary | ICD-10-CM | POA: Diagnosis not present

## 2020-12-18 LAB — GLUCOSE, CAPILLARY: Glucose-Capillary: 127 mg/dL — ABNORMAL HIGH (ref 70–99)

## 2020-12-18 NOTE — Plan of Care (Signed)
  Problem: Education: Goal: Knowledge of General Education information will improve Description: Including pain rating scale, medication(s)/side effects and non-pharmacologic comfort measures Outcome: Adequate for Discharge   Problem: Health Behavior/Discharge Planning: Goal: Ability to manage health-related needs will improve Outcome: Adequate for Discharge   Problem: Clinical Measurements: Goal: Ability to maintain clinical measurements within normal limits will improve Outcome: Adequate for Discharge Goal: Will remain free from infection Outcome: Adequate for Discharge Goal: Diagnostic test results will improve Outcome: Adequate for Discharge Goal: Respiratory complications will improve Outcome: Adequate for Discharge Goal: Cardiovascular complication will be avoided Outcome: Adequate for Discharge   Problem: Activity: Goal: Risk for activity intolerance will decrease Outcome: Adequate for Discharge   Problem: Nutrition: Goal: Adequate nutrition will be maintained Outcome: Adequate for Discharge   Problem: Coping: Goal: Level of anxiety will decrease Outcome: Adequate for Discharge   Problem: Elimination: Goal: Will not experience complications related to bowel motility Outcome: Adequate for Discharge Goal: Will not experience complications related to urinary retention Outcome: Adequate for Discharge   Problem: Pain Managment: Goal: General experience of comfort will improve Outcome: Adequate for Discharge   Problem: Skin Integrity: Goal: Risk for impaired skin integrity will decrease Outcome: Adequate for Discharge   Problem: Safety: Goal: Ability to remain free from injury will improve Outcome: Adequate for Discharge   

## 2020-12-18 NOTE — Progress Notes (Signed)
Subjective: 8 Days Post-Op Procedure(s) (LRB): ARTHROPLASTY BIPOLAR HIP (HEMIARTHROPLASTY) (Left) Patient is resting comfortably this morning, denies any significant pain. Patient is well, and has had no acute complaints or problems Current plan is for discharge to SNF when able. Negative for chest pain and shortness of breath Fever: no Gastrointestinal:negative for nausea and vomiting Patient with history of dementia, unable to provide a accurate history.  Objective: Vital signs in last 24 hours: Temp:  [97.8 F (36.6 C)-98.6 F (37 C)] 98 F (36.7 C) (10/26 0700) Pulse Rate:  [70-84] 70 (10/26 0700) Resp:  [16-20] 16 (10/26 0700) BP: (118-131)/(53-75) 128/68 (10/26 0700) SpO2:  [93 %-98 %] 96 % (10/26 0700)  Intake/Output from previous day:  Intake/Output Summary (Last 24 hours) at 12/18/2020 0804 Last data filed at 12/17/2020 2200 Gross per 24 hour  Intake --  Output 800 ml  Net -800 ml    Intake/Output this shift: No intake/output data recorded.  Labs: Recent Labs    12/16/20 0910  HGB 10.8*    Recent Labs    12/16/20 0910  WBC 9.3  RBC 3.82*  HCT 32.7*  PLT 230    Recent Labs    12/17/20 0547  NA 135  K 4.2  CL 104  CO2 25  BUN 33*  CREATININE 1.37*  GLUCOSE 111*  CALCIUM 8.5*    No results for input(s): LABPT, INR in the last 72 hours.    EXAM General - Patient is well this AM, denies any significant pain. Extremity - ABD soft Neurovascular intact Dorsiflexion/Plantar flexion intact Incision: scant drainage No cellulitis present Compartment soft Dressing/Incision - Mild dry bloody drainage noted to the left hip.  Motor Function - intact, moving foot and toes well on exam.  Abdomen soft with normal bowel sounds this morning.  Past Medical History:  Diagnosis Date   Arthritis    Benign prostate hyperplasia    Cataracts, bilateral    Coarse tremors    L>R hands   COPD (chronic obstructive pulmonary disease) (HCC)    first  stages   Coronary artery disease    Dizziness    occasional   Gastric ulcer    in the 60's   GERD (gastroesophageal reflux disease)    takes Omeprazole every other day   Hard of hearing    doesn't wear hearing aids   History of colon polyps    History of kidney stones    still one on the left side, low   Hyperlipidemia    takes Simvastatin daily   Hypertension    stopped BP meds, BP under control   Macular degeneration    Myocardial infarction [facility]) 2009   Peripheral neuropathy    takes Gabapentin daily   Pneumonia    hx of in the 60's   Sleep apnea    Assessment/Plan: 8 Days Post-Op Procedure(s) (LRB): ARTHROPLASTY BIPOLAR HIP (HEMIARTHROPLASTY) (Left) Principal Problem:   Closed displaced fracture of left femoral neck (HCC) Active Problems:   Dementia due to Parkinson's disease without behavioral disturbance ([place])   Hypertension   Coronary artery disease involving coronary bypass graft of native heart without angina pectoris   CKD (chronic kidney disease) stage 3, GFR 30-59 ml/min (HCC)   At high risk for falls   Malnutrition of moderate degree  Estimated body mass index is 25.1 kg/m as calculated from the following:   Height as of this encounter: 5\' 9"  (1.753 m).   Weight as of this encounter: 77.1 kg. Advance diet Up  with therapy D/C IV fluids when tolerating po intake.  Vital signs are stable this morning. Afebrile Up with therapy.  Plan is for SNF upon discharge pending placement  Upon discharge, continue Lovenox 40mg  daily for 14 days. Staples can be removed by SNF on 12/24/20.  Follow-up with [facility] orthopaedics in 6 weeks.  DVT Prophylaxis - Lovenox and Foot Pumps Weight-Bearing as tolerated to left leg  J. Cameron Proud, PA-C [facility] Orthopaedic Surgery 12/18/2020, 8:04 AM

## 2020-12-18 NOTE — TOC Transition Note (Signed)
Transition of Care [facility]) - CM/SW Discharge Note   Patient Details  Name: Jason Cross. MRN: [id number] Date of Birth: 01-Sep-1928  Transition of Care [facility]) CM/SW Contact:  Kerin Salen, RN Phone Number: 12/18/2020, 11:35 AM   Clinical Narrative: To discharge to [facility] today via EMS at 1pm. Spouse notified. Patient to discharge to room 603B, nurse given number to call for report. TOC barriers resolved.      Final next level of care: [facility] [place] Barriers to Discharge: Barriers Resolved   Patient Goals and CMS Choice        Discharge Placement              Patient chooses bed at: [facility] [place] Patient to be transferred to facility by: [facility] Name of family member notified: Omeed, Osuna (Spouse)   [phone] Patient and family notified of of transfer: 12/18/20  Discharge Plan and Services                                     Social Determinants of Health (SDOH) Interventions     Readmission Risk Interventions No flowsheet data found.

## 2020-12-18 NOTE — Progress Notes (Signed)
Physical Therapy Treatment Patient Details Name: Jason Cross. MRN: [id number] DOB: Jun 16, 1928 Today's Date: 12/18/2020   History of Present Illness Jason Cross. is a 87 y.o. male with medical history significant for dementia, Parkinson's disease, coronary artery disease status post CABG, peripheral neuropathy, GERD who presents to the ER via EMS for evaluation following a fall at home.    PT Comments    Therapist in this am. Pt resting in bed, wife at bedside. Pt with c/o fatigue, declined mobility, agreed to LE strengthening exercises with primary focus on Left LE. Pt initially resistant to movement due to anticipation of left hip pain and falling asleep during ROM. Pillow placed in between knees to prevent hip tightness with excessive resting internal rotation. Bed alarm on, call bell in reach, all needs met.  Awaiting transfer to SNF once bed available.    Recommendations for follow up therapy are one component of a multi-disciplinary discharge planning process, led by the attending physician.  Recommendations may be updated based on patient status, additional functional criteria and insurance authorization.  Follow Up Recommendations  Skilled nursing-short term rehab (<3 hours/day)     Assistance Recommended at Discharge    Equipment Recommendations       Recommendations for Other Services OT consult     Precautions / Restrictions Precautions Precautions: Fall Restrictions Weight Bearing Restrictions: Yes LLE Weight Bearing: Weight bearing as tolerated     Mobility  Bed Mobility                    Transfers                        Ambulation/Gait                 Stairs             Wheelchair Mobility    Modified Rankin (Stroke Patients Only)       Balance                                            Cognition Arousal/Alertness: Awake/alert (fatigued) Behavior During Therapy: WFL for tasks  assessed/performed Overall Cognitive Status: History of cognitive impairments - at baseline                                 General Comments:  (HOH, impaired vision)        Exercises Total Joint Exercises Ankle Circles/Pumps: AROM;Both;20 reps Heel Slides: AAROM;Left;20 reps Hip ABduction/ADduction: AAROM;Left;20 reps Straight Leg Raises: AAROM;Left;5 reps General Exercises - Lower Extremity Short Arc QuadSinclair Ship;Left;10 reps    General Comments General comments (skin integrity, edema, etc.):  (Pt refused bed mobility/OOB activity)      Pertinent Vitals/Pain Pain Assessment: No/denies pain    Home Living                          Prior Function            PT Goals (current goals can now be found in the care plan section) Acute Rehab PT Goals Patient Stated Goal:  (go to rehab)    Frequency    7X/week      PT Plan Current plan remains appropriate    Co-evaluation  AM-PAC PT "6 Clicks" Mobility   Outcome Measure  Help needed turning from your back to your side while in a flat bed without using bedrails?: A Lot Help needed moving from lying on your back to sitting on the side of a flat bed without using bedrails?: A Lot Help needed moving to and from a bed to a chair (including a wheelchair)?: A Lot Help needed standing up from a chair using your arms (e.g., wheelchair or bedside chair)?: A Lot Help needed to walk in hospital room?: Total Help needed climbing 3-5 steps with a railing? : Total 6 Click Score: 10    End of Session   Activity Tolerance: Patient limited by fatigue Patient left: in bed;with call bell/phone within reach;with bed alarm set Nurse Communication: Mobility status PT Visit Diagnosis: Unsteadiness on feet (R26.81);History of falling (Z91.81);Other abnormalities of gait and mobility (R26.89);Repeated falls (R29.6);Muscle weakness (generalized) (M62.81);Pain     Time: 1002-1018 PT Time  Calculation (min) (ACUTE ONLY): 16 min  Charges:  $Therapeutic Exercise: 8-22 mins                    [name], PTA   [name] 12/18/2020, 10:32 AM

## 2020-12-18 NOTE — Progress Notes (Signed)
Nutrition Follow-up  DOCUMENTATION CODES:  Non-severe (moderate) malnutrition in context of chronic illness  INTERVENTION:  Continue regular diet due to advanced age and poor intake Ensure Enlive po BID, each supplement provides 350 kcal and 20 grams of protein MVI with minerals daily Magic cup TID with meals, each supplement provides 290 kcal and 9 grams of protein Request new measured weight  NUTRITION DIAGNOSIS:  Moderate Malnutrition related to chronic illness (COPD) as evidenced by mild muscle depletion, mild fat depletion, moderate muscle depletion.  GOAL:  Patient will meet greater than or equal to 90% of their needs  MONITOR:  Supplement acceptance, PO intake, Skin  REASON FOR ASSESSMENT:  Consult Hip fracture protocol  ASSESSMENT:  87 y.o. male with history of COPD, CAD, GERD, HTN, HLD, neuropathy, CKD and hx MI presented to ED with pain after a fall at home.  Imaging in ED showed a displaced left femoral neck fracture. Orthopedics consulted and took pt for surgical repair 10/18.  Limited meal intake to review over the last few days but reviewed dining records and pt is ordering all meals. Also  routinely accepting ensure enlive supplements which provide a significant source of kcal and protein to aid in post-op recovery. Will continue current nutrition plan.  Pt medically stable to be dc to SNF, bed offer has been accepted by family, awaiting availability at facility.  Noted no weight has been obtained since admission. Will request new measured be obtained to assess for accuracy and if pt has lost weight.   Average Meal Intake: 10/20-10/23: 30% intake x 5 recorded meals (0-50%)  Nutritionally Relevant Medications: Scheduled Meds:  cephALEXin  500 mg Oral Q8H   docusate sodium  100 mg Oral BID   Ensure Enlive  237 mL Oral BID BM   multivitamin with minerals  1 tablet Oral Daily   pantoprazole  40 mg Oral Daily   PRN Meds: bisacodyl, diphenhydrAMINE, magnesium  hydroxide, metoCLOPramide, ondansetron  Labs Reviewed: BUN 33, Creatinine 1.37 (10/25)  NUTRITION - FOCUSED PHYSICAL EXAM: Flowsheet Row Most Recent Value  Orbital Region Mild depletion  Upper Arm Region Mild depletion  Thoracic and Lumbar Region Mild depletion  Buccal Region Mild depletion  Temple Region Mild depletion  Clavicle Bone Region Mild depletion  Clavicle and Acromion Bone Region Mild depletion  Scapular Bone Region Mild depletion  Dorsal Hand Mild depletion  Patellar Region Moderate depletion  Anterior Thigh Region Moderate depletion  Posterior Calf Region Moderate depletion  Edema (RD Assessment) None  Hair Reviewed  Eyes Reviewed  Mouth Reviewed  Skin Reviewed  Nails Reviewed   Diet Order:   Diet Order             Diet - low sodium heart healthy           Diet regular Room service appropriate? Yes; Fluid consistency: Thin  Diet effective now                   EDUCATION NEEDS:  Education needs have been addressed  Skin:  Skin Assessment: Reviewed RN Assessment (surgical incisions, left hip)  Last BM:  10/24 - type 4  Height:  Ht Readings from Last 1 Encounters:  12/10/20 5\' 9"  (1.753 m)    Weight:  Wt Readings from Last 1 Encounters:  12/10/20 77.1 kg    Ideal Body Weight:  72.7 kg  BMI:  Body mass index is 25.1 kg/m.  Estimated Nutritional Needs:  Kcal:  1800-2000 kcal/d Protein:  90-100 g/d Fluid:  2-2.2L/d   Ranell Patrick, RD, LDN Clinical Dietitian RD pager # available in [facility]  After hours/weekend pager # available in [facility]

## 2020-12-19 DIAGNOSIS — N403 Nodular prostate with lower urinary tract symptoms: Secondary | ICD-10-CM | POA: Diagnosis not present

## 2020-12-19 DIAGNOSIS — I2510 Atherosclerotic heart disease of native coronary artery without angina pectoris: Secondary | ICD-10-CM | POA: Diagnosis not present

## 2020-12-19 DIAGNOSIS — G20 Parkinson's disease: Secondary | ICD-10-CM | POA: Diagnosis not present

## 2020-12-19 DIAGNOSIS — I1310 Hypertensive heart and chronic kidney disease without heart failure, with stage 1 through stage 4 chronic kidney disease, or unspecified chronic kidney disease: Secondary | ICD-10-CM | POA: Diagnosis not present

## 2020-12-19 DIAGNOSIS — R54 Age-related physical debility: Secondary | ICD-10-CM | POA: Diagnosis not present

## 2020-12-23 ENCOUNTER — Telehealth: Payer: Self-pay | Admitting: Nurse Practitioner

## 2020-12-23 DIAGNOSIS — M6281 Muscle weakness (generalized): Secondary | ICD-10-CM | POA: Diagnosis not present

## 2020-12-23 DIAGNOSIS — R69 Illness, unspecified: Secondary | ICD-10-CM | POA: Diagnosis not present

## 2020-12-23 DIAGNOSIS — S72302A Unspecified fracture of shaft of left femur, initial encounter for closed fracture: Secondary | ICD-10-CM | POA: Diagnosis not present

## 2020-12-23 DIAGNOSIS — R339 Retention of urine, unspecified: Secondary | ICD-10-CM | POA: Diagnosis not present

## 2020-12-23 NOTE — Telephone Encounter (Signed)
Copied from [place] [id number]. Topic: General - Other >> Dec 23, 2020 12:44 PM Alanda Slim E wrote: Reason for CRM: Pts wife called to speak with [name] about some concerns she has / she would like [name] to call her when she has a chance /please advise

## 2020-12-24 NOTE — Telephone Encounter (Signed)
Called and spoke with patient's wife. She states that the patient has been discharged from the hospital and has been in rehab since last 12/18/20. Patients wife states that she cannot afford LTC after rehab discharge from rehab, she wants to go home from rehab. If they release him to home, can he have PT, OT, and nursing via home health? Also wants to discuss ordering rolling chair for him as well. Patient's wife states she has tried talking to the [place] about options as to what they can do after discharge but has not gotten any answers from them. Scheduled the patient a virtual visit as he is still in rehab to be able to discuss with wife and have documentation. Advised her that we will more than likely have to have an in office visit for medicare documentation for orders that may be needed.

## 2020-12-26 DIAGNOSIS — S72302A Unspecified fracture of shaft of left femur, initial encounter for closed fracture: Secondary | ICD-10-CM | POA: Diagnosis not present

## 2020-12-26 DIAGNOSIS — R339 Retention of urine, unspecified: Secondary | ICD-10-CM | POA: Diagnosis not present

## 2020-12-26 DIAGNOSIS — M6281 Muscle weakness (generalized): Secondary | ICD-10-CM | POA: Diagnosis not present

## 2020-12-27 ENCOUNTER — Encounter: Payer: Self-pay | Admitting: Nurse Practitioner

## 2020-12-30 ENCOUNTER — Telehealth (INDEPENDENT_AMBULATORY_CARE_PROVIDER_SITE_OTHER): Payer: Medicare HMO | Admitting: Nurse Practitioner

## 2020-12-30 ENCOUNTER — Encounter: Payer: Self-pay | Admitting: Nurse Practitioner

## 2020-12-30 DIAGNOSIS — F0280 Dementia in other diseases classified elsewhere without behavioral disturbance: Secondary | ICD-10-CM

## 2020-12-30 DIAGNOSIS — G20 Parkinson's disease: Secondary | ICD-10-CM | POA: Diagnosis not present

## 2020-12-30 DIAGNOSIS — M6281 Muscle weakness (generalized): Secondary | ICD-10-CM | POA: Diagnosis not present

## 2020-12-30 DIAGNOSIS — R69 Illness, unspecified: Secondary | ICD-10-CM | POA: Diagnosis not present

## 2020-12-30 DIAGNOSIS — Z96649 Presence of unspecified artificial hip joint: Secondary | ICD-10-CM | POA: Diagnosis not present

## 2020-12-30 DIAGNOSIS — R400 Somnolence: Secondary | ICD-10-CM | POA: Diagnosis not present

## 2020-12-30 NOTE — Progress Notes (Signed)
There were no vitals taken for this visit.   Subjective:    Patient ID: Jason Cobb., male    DOB: [birth year], 87 y.o.   MRN: [id number]  HPI: Jason W Helios Kohlmann. is a 87 y.o. male  Chief Complaint  Patient presents with   Home Health    Pt's wife states that the patient is currently in Rehab from hip replacement surgery and would like to discuss home health and what is going to happen after his discharge from Rehab   This visit was completed via telephone due to the restrictions of the COVID-19 pandemic. All issues as above were discussed and addressed but no physical exam was performed. If it was felt that the patient should be evaluated in the office, they were directed there. The patient verbally consented to this visit. Patient was unable to complete an audio/visual visit due to Lack of equipment. Due to the catastrophic nature of the COVID-19 pandemic, this visit was done through audio contact only. Location of the patient: home Location of the provider: work Those involved with this call:  Provider: Marnee Guarneri, DNP CMA: Irena Reichmann, [place] Desk/Registration: [employer]  Time spent on call:  21 minutes on the phone discussing health concerns. 15 minutes total spent in review of patient's record and preparation of their chart.  I verified patient/wife identity using two factors (patient name and date of birth). Patient consents verbally to being seen via telemedicine visit today.     Wife is main caregiver and DPR, she provided HPI today.  LEFT HIP ARTHROPLASTY: Patient currently in rehab after left hip repair on 12/10/20, had a fall on 12/10/20 -- has underlying dementia.  Moved to [facility] in [place] on 12/18/20.  [facility] has approved up to 20 days, but at anytime they could state he is ready to go home (13th day today and 17th day on Sunday).  They did discuss long term placement with his wife.  She is not satisfied with care at [facility].  Had a catheter which was  taken out on the 31st, he is very confused and sundowning per his wife's report.  She went into visit recently and he was in bed naked with pillow cases taken off as well.    She was told that when he was discharged home he would have home health care.  She does not want long term care and would prefer to bring him home and try to care for him at home. Wife has got him enrolled in [facility] system (60%) -- 11/09/20 == took her awhile to get him enrolled due to [employer] records last name showing different then his name.  She does endorse some decline in patient memory since recent event.  Relevant past medical, surgical, family and social history reviewed and updated as indicated. Interim medical history since our last visit reviewed. Allergies and medications reviewed and updated.  Review of Systems  Constitutional:  Negative for activity change, diaphoresis, fatigue and fever.  Respiratory:  Negative for cough, chest tightness, shortness of breath and wheezing.   Cardiovascular:  Negative for chest pain, palpitations and leg swelling.  Gastrointestinal: Negative.   Neurological: Negative.   Psychiatric/Behavioral: Negative.     Per HPI unless specifically indicated above     Objective:    There were no vitals taken for this visit.  Wt Readings from Last 3 Encounters:  12/10/20 170 lb (77.1 kg)  09/27/20 161 lb (73 kg)  09/06/20 160 lb (72.6  kg)    Physical Exam  Unable to perform due to telephone visit only.  Results for orders placed or performed during the hospital encounter of 12/10/20  Resp Panel by RT-PCR (Flu A&B, Covid) Nasopharyngeal Swab   Specimen: Nasopharyngeal Swab; Nasopharyngeal(NP) swabs in vial transport medium  Result Value Ref Range   SARS Coronavirus 2 by RT PCR NEGATIVE NEGATIVE   Influenza A by PCR NEGATIVE NEGATIVE   Influenza B by PCR NEGATIVE NEGATIVE  Urine Culture   Specimen: In/Out Cath Urine  Result Value Ref Range   Specimen Description       IN/OUT CATH URINE Performed at [facility], [address] [place], [place] [address]    Special Requests      NONE Performed at [facility], [address], [place], [place] [address]    Culture      NO GROWTH Performed at [facility], [place] [address] [place], [place] [address]    Report Status 12/14/2020 FINAL   CULTURE, BLOOD (ROUTINE X 2) w Reflex to ID Panel   Specimen: BLOOD  Result Value Ref Range   Specimen Description BLOOD [facility]    Special Requests BOTTLES DRAWN AEROBIC AND ANAEROBIC BCAV    Culture      NO GROWTH 5 DAYS Performed at [facility], [address] [place], [place] [address]    Report Status 12/17/2020 FINAL   CULTURE, BLOOD (ROUTINE X 2) w Reflex to ID Panel   Specimen: BLOOD  Result Value Ref Range   Specimen Description BLOOD BLOOD LEFT WRIST    Special Requests      BOTTLES DRAWN AEROBIC AND ANAEROBIC Blood Culture adequate volume   Culture      NO GROWTH 5 DAYS Performed at [facility], [address] [place], [place] [address]    Report Status 12/17/2020 FINAL   Resp Panel by RT-PCR (Flu A&B, Covid) Nasopharyngeal Swab   Specimen: Nasopharyngeal Swab; Nasopharyngeal(NP) swabs in vial transport medium  Result Value Ref Range   SARS Coronavirus 2 by RT PCR NEGATIVE NEGATIVE   Influenza A by PCR NEGATIVE NEGATIVE   Influenza B by PCR NEGATIVE NEGATIVE  CBC with Differential  Result Value Ref Range   WBC 6.7 4.0 - 10.5 K/uL   RBC 4.46 4.22 - 5.81 MIL/uL   Hemoglobin 12.8 (L) 13.0 - 17.0 g/dL   HCT 39.7 39.0 - 52.0 %   MCV 89.0 80.0 - 100.0 fL   MCH 28.7 26.0 - 34.0 pg   MCHC 32.2 30.0 - 36.0 g/dL   RDW 15.4 11.5 - 15.5 %   Platelets 231 150 - 400 K/uL   nRBC 0.0 0.0 - 0.2 %   Neutrophils Relative % 55 %   Neutro Abs 3.7 1.7 - 7.7 K/uL   Lymphocytes Relative 33 %   Lymphs Abs 2.2 0.7 - 4.0 K/uL   Monocytes Relative 7 %   Monocytes Absolute 0.4 0.1 - 1.0 K/uL   Eosinophils Relative 4 %    Eosinophils Absolute 0.2 0.0 - 0.5 K/uL   Basophils Relative 1 %   Basophils Absolute 0.1 0.0 - 0.1 K/uL   Immature Granulocytes 0 %   Abs Immature Granulocytes 0.01 0.00 - 0.07 K/uL  Basic metabolic panel  Result Value Ref Range   Sodium 142 135 - 145 mmol/L   Potassium 4.0 3.5 - 5.1 mmol/L   Chloride 107 98 - 111 mmol/L   CO2 27 22 - 32 mmol/L  Glucose, Bld 99 70 - 99 mg/dL   BUN 25 (H) 8 - 23 mg/dL   Creatinine, Ser 1.58 (H) 0.61 - 1.24 mg/dL   Calcium 9.1 8.9 - 10.3 mg/dL   GFR, Estimated 41 (L) >60 mL/min   Anion gap 8 5 - 15  Protime-INR  Result Value Ref Range   Prothrombin Time 13.7 11.4 - 15.2 seconds   INR 1.1 0.8 - 1.2  APTT  Result Value Ref Range   aPTT 30 24 - 36 seconds  Basic metabolic panel  Result Value Ref Range   Sodium 137 135 - 145 mmol/L   Potassium 4.4 3.5 - 5.1 mmol/L   Chloride 107 98 - 111 mmol/L   CO2 23 22 - 32 mmol/L   Glucose, Bld 124 (H) 70 - 99 mg/dL   BUN 23 8 - 23 mg/dL   Creatinine, Ser 1.50 (H) 0.61 - 1.24 mg/dL   Calcium 8.6 (L) 8.9 - 10.3 mg/dL   GFR, Estimated 44 (L) >60 mL/min   Anion gap 7 5 - 15  CBC  Result Value Ref Range   WBC 11.6 (H) 4.0 - 10.5 K/uL   RBC 4.17 (L) 4.22 - 5.81 MIL/uL   Hemoglobin 12.3 (L) 13.0 - 17.0 g/dL   HCT 36.4 (L) 39.0 - 52.0 %   MCV 87.3 80.0 - 100.0 fL   MCH 29.5 26.0 - 34.0 pg   MCHC 33.8 30.0 - 36.0 g/dL   RDW 15.2 11.5 - 15.5 %   Platelets 187 150 - 400 K/uL   nRBC 0.0 0.0 - 0.2 %  Basic metabolic panel  Result Value Ref Range   Sodium 137 135 - 145 mmol/L   Potassium 4.2 3.5 - 5.1 mmol/L   Chloride 107 98 - 111 mmol/L   CO2 24 22 - 32 mmol/L   Glucose, Bld 98 70 - 99 mg/dL   BUN 31 (H) 8 - 23 mg/dL   Creatinine, Ser 1.57 (H) 0.61 - 1.24 mg/dL   Calcium 8.7 (L) 8.9 - 10.3 mg/dL   GFR, Estimated 42 (L) >60 mL/min   Anion gap 6 5 - 15  CBC  Result Value Ref Range   WBC 10.3 4.0 - 10.5 K/uL   RBC 4.08 (L) 4.22 - 5.81 MIL/uL   Hemoglobin 12.0 (L) 13.0 - 17.0 g/dL   HCT 35.6 (L)  39.0 - 52.0 %   MCV 87.3 80.0 - 100.0 fL   MCH 29.4 26.0 - 34.0 pg   MCHC 33.7 30.0 - 36.0 g/dL   RDW 15.4 11.5 - 15.5 %   Platelets 175 150 - 400 K/uL   nRBC 0.0 0.0 - 0.2 %  Urinalysis, Complete w Microscopic Urine, Catheterized  Result Value Ref Range   Color, Urine YELLOW (A) YELLOW   APPearance CLEAR (A) CLEAR   Specific Gravity, Urine 1.020 1.005 - 1.030   pH 5.0 5.0 - 8.0   Glucose, UA NEGATIVE NEGATIVE mg/dL   Hgb urine dipstick NEGATIVE NEGATIVE   Bilirubin Urine NEGATIVE NEGATIVE   Ketones, ur 5 (A) NEGATIVE mg/dL   Protein, ur 30 (A) NEGATIVE mg/dL   Nitrite NEGATIVE NEGATIVE   Leukocytes,Ua MODERATE (A) NEGATIVE   RBC / HPF 0-5 0 - 5 RBC/hpf   WBC, UA 21-50 0 - 5 WBC/hpf   Bacteria, UA NONE SEEN NONE SEEN   Squamous Epithelial / LPF 0-5 0 - 5   Mucus PRESENT   Basic metabolic panel  Result Value Ref Range   Sodium  137 135 - 145 mmol/L   Potassium 4.1 3.5 - 5.1 mmol/L   Chloride 109 98 - 111 mmol/L   CO2 23 22 - 32 mmol/L   Glucose, Bld 120 (H) 70 - 99 mg/dL   BUN 34 (H) 8 - 23 mg/dL   Creatinine, Ser 1.45 (H) 0.61 - 1.24 mg/dL   Calcium 8.4 (L) 8.9 - 10.3 mg/dL   GFR, Estimated 46 (L) >60 mL/min   Anion gap 5 5 - 15  CBC  Result Value Ref Range   WBC 9.5 4.0 - 10.5 K/uL   RBC 3.71 (L) 4.22 - 5.81 MIL/uL   Hemoglobin 10.8 (L) 13.0 - 17.0 g/dL   HCT 32.3 (L) 39.0 - 52.0 %   MCV 87.1 80.0 - 100.0 fL   MCH 29.1 26.0 - 34.0 pg   MCHC 33.4 30.0 - 36.0 g/dL   RDW 15.5 11.5 - 15.5 %   Platelets 172 150 - 400 K/uL   nRBC 0.0 0.0 - 0.2 %  Glucose, capillary  Result Value Ref Range   Glucose-Capillary 125 (H) 70 - 99 mg/dL   Comment 1 Notify RN   CBC with Differential/Platelet  Result Value Ref Range   WBC 9.3 4.0 - 10.5 K/uL   RBC 3.82 (L) 4.22 - 5.81 MIL/uL   Hemoglobin 10.8 (L) 13.0 - 17.0 g/dL   HCT 32.7 (L) 39.0 - 52.0 %   MCV 85.6 80.0 - 100.0 fL   MCH 28.3 26.0 - 34.0 pg   MCHC 33.0 30.0 - 36.0 g/dL   RDW 15.0 11.5 - 15.5 %   Platelets 230 150 -  400 K/uL   nRBC 0.0 0.0 - 0.2 %   Neutrophils Relative % 71 %   Neutro Abs 6.7 1.7 - 7.7 K/uL   Lymphocytes Relative 17 %   Lymphs Abs 1.6 0.7 - 4.0 K/uL   Monocytes Relative 9 %   Monocytes Absolute 0.9 0.1 - 1.0 K/uL   Eosinophils Relative 1 %   Eosinophils Absolute 0.1 0.0 - 0.5 K/uL   Basophils Relative 1 %   Basophils Absolute 0.1 0.0 - 0.1 K/uL   Immature Granulocytes 1 %   Abs Immature Granulocytes 0.05 0.00 - 0.07 K/uL  Basic metabolic panel  Result Value Ref Range   Sodium 135 135 - 145 mmol/L   Potassium 4.2 3.5 - 5.1 mmol/L   Chloride 104 98 - 111 mmol/L   CO2 25 22 - 32 mmol/L   Glucose, Bld 111 (H) 70 - 99 mg/dL   BUN 33 (H) 8 - 23 mg/dL   Creatinine, Ser 1.37 (H) 0.61 - 1.24 mg/dL   Calcium 8.5 (L) 8.9 - 10.3 mg/dL   GFR, Estimated 49 (L) >60 mL/min   Anion gap 6 5 - 15  Glucose, capillary  Result Value Ref Range   Glucose-Capillary 115 (H) 70 - 99 mg/dL   Comment 1 Notify RN   Glucose, capillary  Result Value Ref Range   Glucose-Capillary 127 (H) 70 - 99 mg/dL   Comment 1 Notify RN    Comment 2 Document in Chart   Type and screen [place]  Result Value Ref Range   ABO/RH(D) O POS    Antibody Screen NEG    Sample Expiration      12/13/2020,2359 Performed at [facility], [address] [place], [place] [address]   Surgical pathology  Result Value Ref Range   SURGICAL PATHOLOGY      SURGICAL PATHOLOGY  CASE: [id number] PATIENT: Jason Cross Surgical Pathology Report     Specimen Submitted: A. Femoral head, left  Clinical History: Left hip fracture     DIAGNOSIS: A. FEMORAL HEAD, LEFT; HEMIARTHROPLASTY: - CHANGES CONSISTENT WITH FRACTURE.  GROSS DESCRIPTION: A. Labeled: Left femoral head Received: Fresh Collection time: 3:21 PM on 12/10/2020 Placed into formalin time: 4:16 PM on 12/10/2020 Size of specimen:      Head -5.3 x 5.1 x 4.6 cm      Neck -4.0 x 2.4 x 1.6 cm      Additional tissue:  None present Articular surface: The articular surface is tan-yellow and glistening Cut surface: The cut surface is tan-yellow and firm Other findings: The bone margin is dark brown, hemorrhagic and jagged  Block summary: 1 -reamings from bone margin 2-representative cross-section  Tissue decalcification: Cassettes 1 and 2  [facility] 12/11/2020  Final Diagnosis performed by Bryan Lemma, MD.   Electronically signed 12/12/2020 5:38:03PM The el ectronic signature indicates that the named Attending Pathologist has evaluated the specimen Technical component performed at [facility], [address] [place], [place] [address] Lab: [phone] Dir: Rush Farmer, MD, MMM  Professional component performed at [facility], [facility], [place], [place], [place] [address] Lab: [phone] Dir: Kathi Simpers, MD       Assessment & Plan:   Problem List Items Addressed This Visit       Nervous and Auditory   Dementia due to Parkinson's disease without behavioral disturbance (HCC)    Chronic, progressive.  Discussed with his wife progressive nature of disease process, continue Donepezil 10 MG.  Continue to collaborate with neurology as needed.  Recent fall with noted decline in memory reported, educated wife on this and hospitalization with dementia. Recommend she reach out to [facility] tomorrow to discuss any needs patient may have and discuss LTC in [place] if needed in future.        Other   Status post hip hemiarthroplasty    Currently remains in rehab facility -- will assist patient and wife upon discharge as needed.  Recommend wife reach out to SW at [place] to further discuss needs too.       I discussed the assessment and treatment plan with the patient. The patient was provided an opportunity to ask questions and all were answered. The patient agreed with the plan and demonstrated an understanding of the instructions.   The patient was advised to call back or seek an  in-person evaluation if the symptoms worsen or if the condition fails to improve as anticipated.   I provided 21+ minutes of time during this encounter.   Follow up plan: Return if symptoms worsen or fail to improve.

## 2020-12-30 NOTE — Patient Instructions (Signed)
Hip Pain The hip is the joint between the upper legs and the lower pelvis. The bones, cartilage, tendons, and muscles of your hip joint support your body and allow you to move around. Hip pain can range from a minor ache to severe pain in one or both of your hips. The pain may be felt on the inside of the hip joint near the groin, or on the outside near the buttocks and upper thigh. You may also have swelling or stiffness in your hip area. Follow these instructions at home: Managing pain, stiffness, and swelling   If directed, put ice on the painful area. To do this: Put ice in a plastic bag. Place a towel between your skin and the bag. Leave the ice on for 20 minutes, 2-3 times a day. If directed, apply heat to the affected area as often as told by your health care provider. Use the heat source that your health care provider recommends, such as a moist heat pack or a heating pad. Place a towel between your skin and the heat source. Leave the heat on for 20-30 minutes. Remove the heat if your skin turns bright red. This is especially important if you are unable to feel pain, heat, or cold. You may have a greater risk of getting burned. Activity Do exercises as told by your health care provider. Avoid activities that cause pain. General instructions  Take over-the-counter and prescription medicines only as told by your health care provider. Keep a journal of your symptoms. Write down: How often you have hip pain. The location of your pain. What the pain feels like. What makes the pain worse. Sleep with a pillow between your legs on your most comfortable side. Keep all follow-up visits as told by your health care provider. This is important. Contact a health care provider if: You cannot put weight on your leg. Your pain or swelling continues or gets worse after one week. It gets harder to walk. You have a fever. Get help right away if: You fall. You have a sudden increase in pain and  swelling in your hip. Your hip is red or swollen or very tender to touch. Summary Hip pain can range from a minor ache to severe pain in one or both of your hips. The pain may be felt on the inside of the hip joint near the groin, or on the outside near the buttocks and upper thigh. Avoid activities that cause pain. Write down how often you have hip pain, the location of the pain, what makes it worse, and what it feels like. This information is not intended to replace advice given to you by your health care provider. Make sure you discuss any questions you have with your health care provider. Document Revised: 06/27/2018 Document Reviewed: 06/27/2018 Elsevier Patient Education  2022 Elsevier Inc.  

## 2020-12-30 NOTE — Assessment & Plan Note (Signed)
Chronic, progressive.  Discussed with his wife progressive nature of disease process, continue Donepezil 10 MG.  Continue to collaborate with neurology as needed.  Recent fall with noted decline in memory reported, educated wife on this and hospitalization with dementia. Recommend she reach out to [facility] tomorrow to discuss any needs patient may have and discuss LTC in [place] if needed in future.

## 2020-12-30 NOTE — Assessment & Plan Note (Signed)
Currently remains in rehab facility -- will assist patient and wife upon discharge as needed.  Recommend wife reach out to SW at [place] to further discuss needs too.

## 2020-12-31 DIAGNOSIS — R400 Somnolence: Secondary | ICD-10-CM | POA: Diagnosis not present

## 2020-12-31 DIAGNOSIS — Z79899 Other long term (current) drug therapy: Secondary | ICD-10-CM | POA: Diagnosis not present

## 2020-12-31 DIAGNOSIS — I959 Hypotension, unspecified: Secondary | ICD-10-CM | POA: Diagnosis not present

## 2021-01-01 DIAGNOSIS — E869 Volume depletion, unspecified: Secondary | ICD-10-CM | POA: Diagnosis not present

## 2021-01-01 DIAGNOSIS — I959 Hypotension, unspecified: Secondary | ICD-10-CM | POA: Diagnosis not present

## 2021-01-01 DIAGNOSIS — R400 Somnolence: Secondary | ICD-10-CM | POA: Diagnosis not present

## 2021-01-02 DIAGNOSIS — R69 Illness, unspecified: Secondary | ICD-10-CM | POA: Diagnosis not present

## 2021-01-02 DIAGNOSIS — I959 Hypotension, unspecified: Secondary | ICD-10-CM | POA: Diagnosis not present

## 2021-01-02 DIAGNOSIS — E869 Volume depletion, unspecified: Secondary | ICD-10-CM | POA: Diagnosis not present

## 2021-01-02 DIAGNOSIS — M6281 Muscle weakness (generalized): Secondary | ICD-10-CM | POA: Diagnosis not present

## 2021-01-03 ENCOUNTER — Emergency Department: Payer: Medicare HMO

## 2021-01-03 ENCOUNTER — Telehealth: Payer: Self-pay | Admitting: Nurse Practitioner

## 2021-01-03 ENCOUNTER — Other Ambulatory Visit: Payer: Self-pay

## 2021-01-03 ENCOUNTER — Inpatient Hospital Stay
Admission: EM | Admit: 2021-01-03 | Discharge: 2021-01-08 | DRG: 871 | Disposition: A | Discharge status: Skilled Nursing Facility | Payer: Medicare HMO | Source: Skilled Nursing Facility | Attending: Internal Medicine | Admitting: Internal Medicine

## 2021-01-03 DIAGNOSIS — Z885 Allergy status to narcotic agent status: Secondary | ICD-10-CM

## 2021-01-03 DIAGNOSIS — F05 Delirium due to known physiological condition: Secondary | ICD-10-CM | POA: Diagnosis present

## 2021-01-03 DIAGNOSIS — Z7189 Other specified counseling: Secondary | ICD-10-CM | POA: Diagnosis not present

## 2021-01-03 DIAGNOSIS — Z8249 Family history of ischemic heart disease and other diseases of the circulatory system: Secondary | ICD-10-CM | POA: Diagnosis not present

## 2021-01-03 DIAGNOSIS — I2510 Atherosclerotic heart disease of native coronary artery without angina pectoris: Secondary | ICD-10-CM | POA: Diagnosis not present

## 2021-01-03 DIAGNOSIS — Z743 Need for continuous supervision: Secondary | ICD-10-CM | POA: Diagnosis not present

## 2021-01-03 DIAGNOSIS — R69 Illness, unspecified: Secondary | ICD-10-CM | POA: Diagnosis not present

## 2021-01-03 DIAGNOSIS — R509 Fever, unspecified: Secondary | ICD-10-CM | POA: Diagnosis not present

## 2021-01-03 DIAGNOSIS — Z66 Do not resuscitate: Secondary | ICD-10-CM | POA: Diagnosis present

## 2021-01-03 DIAGNOSIS — N390 Urinary tract infection, site not specified: Secondary | ICD-10-CM | POA: Diagnosis present

## 2021-01-03 DIAGNOSIS — M1990 Unspecified osteoarthritis, unspecified site: Secondary | ICD-10-CM | POA: Diagnosis present

## 2021-01-03 DIAGNOSIS — Z7982 Long term (current) use of aspirin: Secondary | ICD-10-CM

## 2021-01-03 DIAGNOSIS — I25810 Atherosclerosis of coronary artery bypass graft(s) without angina pectoris: Secondary | ICD-10-CM | POA: Diagnosis present

## 2021-01-03 DIAGNOSIS — I5022 Chronic systolic (congestive) heart failure: Secondary | ICD-10-CM | POA: Diagnosis not present

## 2021-01-03 DIAGNOSIS — N1832 Chronic kidney disease, stage 3b: Secondary | ICD-10-CM | POA: Diagnosis present

## 2021-01-03 DIAGNOSIS — G629 Polyneuropathy, unspecified: Secondary | ICD-10-CM | POA: Diagnosis not present

## 2021-01-03 DIAGNOSIS — R4182 Altered mental status, unspecified: Secondary | ICD-10-CM | POA: Diagnosis not present

## 2021-01-03 DIAGNOSIS — A4181 Sepsis due to Enterococcus: Secondary | ICD-10-CM | POA: Diagnosis not present

## 2021-01-03 DIAGNOSIS — M6281 Muscle weakness (generalized): Secondary | ICD-10-CM | POA: Diagnosis not present

## 2021-01-03 DIAGNOSIS — A4151 Sepsis due to Escherichia coli [E. coli]: Secondary | ICD-10-CM | POA: Diagnosis not present

## 2021-01-03 DIAGNOSIS — E860 Dehydration: Secondary | ICD-10-CM | POA: Diagnosis not present

## 2021-01-03 DIAGNOSIS — Z91041 Radiographic dye allergy status: Secondary | ICD-10-CM

## 2021-01-03 DIAGNOSIS — Z87898 Personal history of other specified conditions: Secondary | ICD-10-CM | POA: Diagnosis not present

## 2021-01-03 DIAGNOSIS — Z515 Encounter for palliative care: Secondary | ICD-10-CM

## 2021-01-03 DIAGNOSIS — F02818 Dementia in other diseases classified elsewhere, unspecified severity, with other behavioral disturbance: Secondary | ICD-10-CM | POA: Diagnosis not present

## 2021-01-03 DIAGNOSIS — G20 Parkinson's disease: Secondary | ICD-10-CM

## 2021-01-03 DIAGNOSIS — Z96642 Presence of left artificial hip joint: Secondary | ICD-10-CM | POA: Diagnosis present

## 2021-01-03 DIAGNOSIS — Z8349 Family history of other endocrine, nutritional and metabolic diseases: Secondary | ICD-10-CM

## 2021-01-03 DIAGNOSIS — F0280 Dementia in other diseases classified elsewhere without behavioral disturbance: Secondary | ICD-10-CM

## 2021-01-03 DIAGNOSIS — Z96649 Presence of unspecified artificial hip joint: Secondary | ICD-10-CM | POA: Diagnosis not present

## 2021-01-03 DIAGNOSIS — Z7401 Bed confinement status: Secondary | ICD-10-CM | POA: Diagnosis not present

## 2021-01-03 DIAGNOSIS — N401 Enlarged prostate with lower urinary tract symptoms: Secondary | ICD-10-CM | POA: Diagnosis not present

## 2021-01-03 DIAGNOSIS — R531 Weakness: Secondary | ICD-10-CM | POA: Diagnosis not present

## 2021-01-03 DIAGNOSIS — J449 Chronic obstructive pulmonary disease, unspecified: Secondary | ICD-10-CM | POA: Diagnosis not present

## 2021-01-03 DIAGNOSIS — Z20822 Contact with and (suspected) exposure to covid-19: Secondary | ICD-10-CM | POA: Diagnosis not present

## 2021-01-03 DIAGNOSIS — I130 Hypertensive heart and chronic kidney disease with heart failure and stage 1 through stage 4 chronic kidney disease, or unspecified chronic kidney disease: Secondary | ICD-10-CM | POA: Diagnosis present

## 2021-01-03 DIAGNOSIS — R410 Disorientation, unspecified: Secondary | ICD-10-CM

## 2021-01-03 DIAGNOSIS — K219 Gastro-esophageal reflux disease without esophagitis: Secondary | ICD-10-CM | POA: Diagnosis present

## 2021-01-03 DIAGNOSIS — G9341 Metabolic encephalopathy: Secondary | ICD-10-CM | POA: Diagnosis present

## 2021-01-03 DIAGNOSIS — R338 Other retention of urine: Secondary | ICD-10-CM | POA: Diagnosis not present

## 2021-01-03 DIAGNOSIS — I10 Essential (primary) hypertension: Secondary | ICD-10-CM | POA: Diagnosis present

## 2021-01-03 DIAGNOSIS — R404 Transient alteration of awareness: Secondary | ICD-10-CM | POA: Diagnosis not present

## 2021-01-03 DIAGNOSIS — E785 Hyperlipidemia, unspecified: Secondary | ICD-10-CM | POA: Diagnosis present

## 2021-01-03 DIAGNOSIS — I252 Old myocardial infarction: Secondary | ICD-10-CM

## 2021-01-03 DIAGNOSIS — N3000 Acute cystitis without hematuria: Secondary | ICD-10-CM

## 2021-01-03 DIAGNOSIS — H9190 Unspecified hearing loss, unspecified ear: Secondary | ICD-10-CM | POA: Diagnosis present

## 2021-01-03 DIAGNOSIS — G4730 Sleep apnea, unspecified: Secondary | ICD-10-CM | POA: Diagnosis present

## 2021-01-03 DIAGNOSIS — Z79899 Other long term (current) drug therapy: Secondary | ICD-10-CM

## 2021-01-03 DIAGNOSIS — R001 Bradycardia, unspecified: Secondary | ICD-10-CM | POA: Diagnosis not present

## 2021-01-03 DIAGNOSIS — N1831 Chronic kidney disease, stage 3a: Secondary | ICD-10-CM | POA: Diagnosis not present

## 2021-01-03 DIAGNOSIS — R9431 Abnormal electrocardiogram [ECG] [EKG]: Secondary | ICD-10-CM | POA: Diagnosis not present

## 2021-01-03 DIAGNOSIS — Z87891 Personal history of nicotine dependence: Secondary | ICD-10-CM

## 2021-01-03 DIAGNOSIS — Z803 Family history of malignant neoplasm of breast: Secondary | ICD-10-CM

## 2021-01-03 DIAGNOSIS — I959 Hypotension, unspecified: Secondary | ICD-10-CM | POA: Diagnosis not present

## 2021-01-03 DIAGNOSIS — R0902 Hypoxemia: Secondary | ICD-10-CM | POA: Diagnosis not present

## 2021-01-03 DIAGNOSIS — R079 Chest pain, unspecified: Secondary | ICD-10-CM | POA: Diagnosis not present

## 2021-01-03 LAB — URINALYSIS, COMPLETE (UACMP) WITH MICROSCOPIC
Bilirubin Urine: NEGATIVE
Glucose, UA: NEGATIVE mg/dL
Ketones, ur: NEGATIVE mg/dL
Nitrite: NEGATIVE
Protein, ur: 30 mg/dL — AB
Specific Gravity, Urine: 1.014 (ref 1.005–1.030)
Squamous Epithelial / HPF: NONE SEEN (ref 0–5)
WBC, UA: >50 WBC/hpf — ABNORMAL HIGH (ref 0–5)
pH: 5 (ref 5.0–8.0)

## 2021-01-03 LAB — COMPREHENSIVE METABOLIC PANEL
ALT: 16 U/L (ref 0–44)
AST: 17 U/L (ref 15–41)
Albumin: 3 g/dL — ABNORMAL LOW (ref 3.5–5.0)
Alkaline Phosphatase: 187 U/L — ABNORMAL HIGH (ref 38–126)
Anion gap: 8 (ref 5–15)
BUN: 40 mg/dL — ABNORMAL HIGH (ref 8–23)
CO2: 22 mmol/L (ref 22–32)
Calcium: 9 mg/dL (ref 8.9–10.3)
Chloride: 108 mmol/L (ref 98–111)
Creatinine, Ser: 1.52 mg/dL — ABNORMAL HIGH (ref 0.61–1.24)
GFR, Estimated: 43 mL/min — ABNORMAL LOW (ref >60)
Glucose, Bld: 108 mg/dL — ABNORMAL HIGH (ref 70–99)
Potassium: 4.3 mmol/L (ref 3.5–5.1)
Sodium: 138 mmol/L (ref 135–145)
Total Bilirubin: 1.4 mg/dL — ABNORMAL HIGH (ref 0.3–1.2)
Total Protein: 6.4 g/dL — ABNORMAL LOW (ref 6.5–8.1)

## 2021-01-03 LAB — CBC
HCT: 35.4 % — ABNORMAL LOW (ref 39.0–52.0)
Hemoglobin: 11.3 g/dL — ABNORMAL LOW (ref 13.0–17.0)
MCH: 28.4 pg (ref 26.0–34.0)
MCHC: 31.9 g/dL (ref 30.0–36.0)
MCV: 88.9 fL (ref 80.0–100.0)
Platelets: 236 10*3/uL (ref 150–400)
RBC: 3.98 MIL/uL — ABNORMAL LOW (ref 4.22–5.81)
RDW: 14.7 % (ref 11.5–15.5)
WBC: 12 10*3/uL — ABNORMAL HIGH (ref 4.0–10.5)
nRBC: 0 % (ref 0.0–0.2)

## 2021-01-03 LAB — RESP PANEL BY RT-PCR (FLU A&B, COVID) ARPGX2
Influenza A by PCR: NEGATIVE
Influenza B by PCR: NEGATIVE
SARS Coronavirus 2 by RT PCR: NEGATIVE

## 2021-01-03 LAB — TROPONIN I (HIGH SENSITIVITY): Troponin I (High Sensitivity): 23 ng/L — ABNORMAL HIGH (ref <18)

## 2021-01-03 MED ORDER — ACETAMINOPHEN 650 MG RE SUPP
650.0000 mg | Freq: Four times a day (QID) | RECTAL | Status: DC | PRN
  Administered 2021-01-04: 02:00:00 650 mg via RECTAL
  Filled 2021-01-03: qty 1

## 2021-01-03 MED ORDER — SODIUM CHLORIDE 0.9 % IV SOLN
1.0000 g | Freq: Once | INTRAVENOUS | Status: AC
  Administered 2021-01-03: 1 g via INTRAVENOUS
  Filled 2021-01-03: qty 10

## 2021-01-03 MED ORDER — ENOXAPARIN SODIUM 40 MG/0.4ML IJ SOSY
40.0000 mg | PREFILLED_SYRINGE | INTRAMUSCULAR | Status: DC
  Administered 2021-01-03 – 2021-01-07 (×5): 40 mg via SUBCUTANEOUS
  Filled 2021-01-03 (×5): qty 0.4

## 2021-01-03 MED ORDER — SODIUM CHLORIDE 0.9 % IV BOLUS
1000.0000 mL | Freq: Once | INTRAVENOUS | Status: AC
  Administered 2021-01-04: 1000 mL via INTRAVENOUS

## 2021-01-03 MED ORDER — ACETAMINOPHEN 325 MG PO TABS
650.0000 mg | ORAL_TABLET | Freq: Four times a day (QID) | ORAL | Status: DC | PRN
  Administered 2021-01-05: 17:00:00 650 mg via ORAL
  Filled 2021-01-03: qty 2

## 2021-01-03 MED ORDER — ONDANSETRON HCL 4 MG/2ML IJ SOLN: 4.0000 mg | Freq: Four times a day (QID) | INTRAMUSCULAR | Status: DC | PRN

## 2021-01-03 MED ORDER — SODIUM CHLORIDE 0.9 % IV SOLN: INTRAVENOUS | Status: DC

## 2021-01-03 MED ORDER — ONDANSETRON HCL 4 MG PO TABS: 4.0000 mg | ORAL_TABLET | Freq: Four times a day (QID) | ORAL | Status: DC | PRN

## 2021-01-03 MED ORDER — SODIUM CHLORIDE 0.9 % IV SOLN
1.0000 g | INTRAVENOUS | Status: DC
  Administered 2021-01-04 – 2021-01-05 (×2): 1 g via INTRAVENOUS
  Filled 2021-01-03: qty 1
  Filled 2021-01-03: qty 10
  Filled 2021-01-03: qty 1

## 2021-01-03 MED ORDER — TAMSULOSIN HCL 0.4 MG PO CAPS
0.4000 mg | ORAL_CAPSULE | Freq: Every evening | ORAL | Status: DC
  Administered 2021-01-04 – 2021-01-07 (×4): 0.4 mg via ORAL
  Filled 2021-01-03 (×4): qty 1

## 2021-01-03 MED ORDER — SODIUM CHLORIDE 0.9 % IV BOLUS
500.0000 mL | Freq: Once | INTRAVENOUS | Status: AC
  Administered 2021-01-04: 500 mL via INTRAVENOUS

## 2021-01-03 MED ORDER — HALOPERIDOL LACTATE 5 MG/ML IJ SOLN
2.0000 mg | Freq: Once | INTRAMUSCULAR | Status: AC
  Administered 2021-01-03: 2 mg via INTRAVENOUS
  Filled 2021-01-03: qty 1

## 2021-01-03 MED ORDER — SODIUM CHLORIDE 0.9 % IV BOLUS
1000.0000 mL | Freq: Once | INTRAVENOUS | Status: AC
  Administered 2021-01-03: 1000 mL via INTRAVENOUS

## 2021-01-03 NOTE — ED Notes (Signed)
New adult brief applied; tolerated fair; spouse at bedside

## 2021-01-03 NOTE — Progress Notes (Signed)
[facility]) Hospital Liaison Note   Received request from Marnee Guarneri, NP for hospice services prior to patient transporting to hospital. [facility] will initiate hospice services once patient is discharged from hospital.   Please do not hesitate to call with any hospice related questions.    Thank you,   Bobbie "Loren Racer, [place], BSN [facility] Liaison [phone]

## 2021-01-03 NOTE — H&P (Signed)
History and Physical    Jason Cross. FWY:[id number] DOB: [birth year] DOA: 01/03/2021  PCP: Venita Lick, NP   Patient coming from: home  I have personally briefly reviewed patient's relevant medical records in [place]  Chief Complaint: altered mental status  HPI: Jason Cross. is a 87 y.o. male with medical history significant for Dementia, Parkinson's disease on no specific meds, CKD 3B, HTN, BPH, CAD s/p CABG, s/p left hip hemiarthroplasty on 10/18 and a hospitalization complicated by urinary retention, discharged with Foley catheter to rehab who presents to the ED for evaluation of altered mental status and generalized weakness.  Patient has baseline confusion with sundowning related to his dementia however for the past week he has been getting increasingly restless and agitated needing personal assistance for ambulation and transfers.  He also had little oral intake. Wife feels like he has not been getting the needed therapy at the facility, probably related to him complaining of dizziness when they stood him up for therapy. He has had no vomiting or diarrhea and no complaints of abdominal pain. He Has had a non productive cough without shortness of breath and no fever or chills.   ED course: On arrival, temperature 99 with otherwise normal vitals Blood work WBC 12,000, hemoglobin 11.3.  Creatinine 1.52 which is at baseline Troponin 23 Urinalysis with large leukocyte esterase and many bacteria.  COVID and flu negative  EKG, personally viewed and interpreted: Sinus at 94 with nonspecific ST-T wave changes  CT head with no acute intracranial abnormality  Patient started on Rocephin for UTI.  He also got a dose of IV haldol for agitation as he was pulling on lines while in the ED. Hospitalist consulted for admission.   Review of Systems: Unable to obtain due to dementia with restlessness   Past Medical History:  Diagnosis Date   Arthritis    Benign  prostate hyperplasia    Cataracts, bilateral    Coarse tremors    L>R hands   COPD (chronic obstructive pulmonary disease) (HCC)    first stages   Coronary artery disease    Dizziness    occasional   Gastric ulcer    in the 60's   GERD (gastroesophageal reflux disease)    takes Omeprazole every other day   Hard of hearing    doesn't wear hearing aids   History of colon polyps    History of kidney stones    still one on the left side, low   Hyperlipidemia    takes Simvastatin daily   Hypertension    stopped BP meds, BP under control   Macular degeneration    Myocardial infarction ([place]) 2009   Peripheral neuropathy    takes Gabapentin daily   Pneumonia    hx of in the 60's   Sleep apnea     Past Surgical History:  Procedure Laterality Date   BACK SURGERY  2007   COLONOSCOPY     CORONARY ARTERY BYPASS GRAFT  2009   LIMA to LAD, SVG to D1 07/2007   EYE SURGERY Bilateral 2004, 2005   implants   GREEN LIGHT LASER TURP (TRANSURETHRAL RESECTION OF PROSTATE N/A 05/10/2013   Procedure: GREEN LIGHT LASER TURP (TRANSURETHRAL RESECTION OF PROSTATE;  Surgeon: Ardis Hughs, MD;  Location: [facility];  Service: Urology;  Laterality: N/A;   HIP ARTHROPLASTY Left 12/10/2020   Procedure: ARTHROPLASTY BIPOLAR HIP (HEMIARTHROPLASTY);  Surgeon: Corky Mull, MD;  Location: [facility] OR[facility];  Service:  Orthopedics;  Laterality: Left;   lipoma removed     chest wall   microthermo prostate  2009   [place] SURGERY  10/14   THORACIC DISCECTOMY N/A 03/10/2013   Procedure: Thoracic twelve-Lumbar one Laminectomy;  Surgeon: Erline Levine, MD;  Location: [facility];  Service: Neurosurgery;  Laterality: N/A;  Thoracic twelve-Lumbar one Laminectomy   TONSILLECTOMY     at age 63     reports that he quit smoking about 27 years ago. His smoking use included cigarettes. He has never used smokeless tobacco. He reports that he does not drink alcohol and does not use drugs.  Allergies   Allergen Reactions   Contrast Media [Iodinated Diagnostic Agents] Hives, Itching and Rash   Codeine Nausea And Vomiting    Family History  Problem Relation Age of Onset   Hip fracture Mother    Heart disease Father    Hypertension Father    Heart attack Father    Thyroid disease Father    Cancer Sister        breast   Breast cancer Sister       Prior to Admission medications   Medication Sig Start Date End Date Taking? Authorizing Provider  acetaminophen (TYLENOL) 500 MG tablet Take 1-2 tablets (500-1,000 mg total) by mouth every 6 (six) hours as needed. 12/13/20   Lattie Corns, PA-C  aspirin EC 81 MG tablet Take 81 mg by mouth every morning.    [provider]  docusate sodium (COLACE) 100 MG capsule Take 1 capsule (100 mg total) by mouth 2 (two) times daily. 12/17/20   Lorella Nimrod, MD  donepezil (ARICEPT) 10 MG tablet Take 1 tablet (10 mg total) by mouth at bedtime. 06/26/19   Cannady, Henrine Screws T, NP  enoxaparin (LOVENOX) 40 MG/0.4ML injection Inject 0.4 mLs (40 mg total) into the skin daily. 12/13/20   Lattie Corns, PA-C  feeding supplement (ENSURE ENLIVE / ENSURE PLUS) LIQD Take 237 mLs by mouth 2 (two) times daily between meals. 12/17/20   Lorella Nimrod, MD  gabapentin (NEURONTIN) 300 MG capsule Take 300 mg by mouth 4 (four) times daily. 06/12/19   [provider]  HYDROcodone-acetaminophen (NORCO/VICODIN) 5-325 MG tablet Take 1 tablet by mouth every 8 (eight) hours as needed for severe pain. 12/13/20   Lattie Corns, PA-C  losartan (COZAAR) 25 MG tablet Take 25 mg by mouth daily. 06/03/19   [provider]  magnesium hydroxide (MILK OF MAGNESIA) 400 MG/5ML suspension Take 30 mLs by mouth daily as needed for mild constipation. 12/17/20   Lorella Nimrod, MD  melatonin 1 MG TABS tablet Take 1 tablet (1 mg total) by mouth at bedtime as needed. 08/02/20   Cannady, Henrine Screws T, NP  methocarbamol (ROBAXIN) 500 MG tablet Take 1 tablet (500 mg  total) by mouth every 6 (six) hours as needed for muscle spasms. 12/17/20   Lorella Nimrod, MD  Multiple Vitamins-Minerals (PRESERVISION AREDS 2 PO) Take 2 tablets by mouth daily.    [provider]  omeprazole (PRILOSEC) 40 MG capsule Take 40 mg by mouth daily. 08/28/19   [provider]  ondansetron (ZOFRAN) 4 MG tablet Take 1 tablet (4 mg total) by mouth every 6 (six) hours as needed for nausea. 12/13/20   Lattie Corns, PA-C  tamsulosin (FLOMAX) 0.4 MG CAPS capsule Take 0.4 mg by mouth every evening. 11/06/20   [provider]    Physical Exam: Vitals:   01/03/21 1235  01/03/21 1529 01/03/21 1746 01/03/21 2025  BP:  (!) 155/90 (!) 150/85 (!) 145/87  Pulse:  99 98 (!) 105  Resp:  17 17 16   Temp:  98.8 F (37.1 C)    TempSrc:  Axillary    SpO2:  99% 98% 95%  Weight: 78 kg     Height: 5\' 9"  (1.753 m)      Constitutional: Frail-appearing elderly male, restless and pulling at lines.  Unable to answer questions and not in any apparent distress HEENT:      Head: Normocephalic and atraumatic.         Eyes: PERLA, EOMI, Conjunctivae are normal. Sclera is non-icteric.       Mouth/Throat: Mucous membranes are moist.       Neck: Supple with no signs of meningismus. Cardiovascular: Regular rate and rhythm. No murmurs, gallops, or rubs. 2+ symmetrical distal pulses are present . No JVD. No  LE edema Respiratory: Respiratory effort normal .Lungs sounds clear bilaterally. No wheezes, crackles, or rhonchi.  Gastrointestinal: Soft, non tender, non distended. Positive bowel sounds.  Genitourinary: No CVA tenderness. Musculoskeletal: Nontender with normal range of motion in all extremities. No cyanosis, or erythema of extremities. Neurologic:  Face is symmetric. Moving all extremities. No gross focal neurologic deficits . Skin: Skin is warm, dry.  No rash or ulcers Psychiatric: Unable to assess due to altered mental status  Labs on Admission: I have personally reviewed  following labs and imaging studies  CBC: Recent Labs  Lab 01/03/21 1237  WBC 12.0*  HGB 11.3*  HCT 35.4*  MCV 88.9  PLT 595   Basic Metabolic Panel: Recent Labs  Lab 01/03/21 1237  NA 138  K 4.3  CL 108  CO2 22  GLUCOSE 108*  BUN 40*  CREATININE 1.52*  CALCIUM 9.0   GFR: Estimated Creatinine Clearance: 32.3 mL/min (A) (by C-G formula based on SCr of 1.52 mg/dL (H)). Liver Function Tests: Recent Labs  Lab 01/03/21 1237  AST 17  ALT 16  ALKPHOS 187*  BILITOT 1.4*  PROT 6.4*  ALBUMIN 3.0*   No results for input(s): LIPASE, AMYLASE in the last 168 hours. No results for input(s): AMMONIA in the last 168 hours. Coagulation Profile: No results for input(s): INR, PROTIME in the last 168 hours. Cardiac Enzymes: No results for input(s): CKTOTAL, CKMB, CKMBINDEX, TROPONINI in the last 168 hours. BNP (last 3 results) No results for input(s): PROBNP in the last 8760 hours. HbA1C: No results for input(s): HGBA1C in the last 72 hours. CBG: No results for input(s): GLUCAP in the last 168 hours. Lipid Profile: No results for input(s): CHOL, HDL, LDLCALC, TRIG, CHOLHDL, LDLDIRECT in the last 72 hours. Thyroid Function Tests: No results for input(s): TSH, T4TOTAL, FREET4, T3FREE, THYROIDAB in the last 72 hours. Anemia Panel: No results for input(s): VITAMINB12, FOLATE, FERRITIN, TIBC, IRON, RETICCTPCT in the last 72 hours. Urine analysis:    Component Value Date/Time   COLORURINE YELLOW (A) 01/03/2021 1534   APPEARANCEUR CLOUDY (A) 01/03/2021 1534   APPEARANCEUR Clear 06/28/2020 1532   LABSPEC 1.014 01/03/2021 1534   PHURINE 5.0 01/03/2021 1534   GLUCOSEU NEGATIVE 01/03/2021 1534   HGBUR MODERATE (A) 01/03/2021 1534   BILIRUBINUR NEGATIVE 01/03/2021 1534   BILIRUBINUR Negative 06/28/2020 [place] 01/03/2021 1534   PROTEINUR 30 (A) 01/03/2021 1534   UROBILINOGEN 0.2 03/26/2013 1331   NITRITE NEGATIVE 01/03/2021 1534   LEUKOCYTESUR LARGE (A)  01/03/2021 1534    Radiological Exams on Admission: CT  HEAD WO CONTRAST (5MM)  Result Date: 01/03/2021 CLINICAL DATA:  Mental status change, unknown cause EXAM: CT HEAD WITHOUT CONTRAST TECHNIQUE: Contiguous axial images were obtained from the base of the skull through the vertex without intravenous contrast. COMPARISON:  December 10, 2020. FINDINGS: Brain: No evidence of acute large vascular territory infarction, hemorrhage, hydrocephalus, extra-axial collection or mass lesion/mass effect. Similar moderate patchy white matter hypoattenuation, nonspecific but compatible with chronic microvascular disease. Similar atrophy. Vascular: No hyperdense vessel. Calcific intracranial atherosclerosis. Skull: No evidence of acute fracture. Sinuses/Orbits: Mucosal thickening in the sphenoid sinuses. No acute orbital findings. Other: No mastoid effusions. IMPRESSION: 1. No evidence of acute intracranial abnormality. 2. Similar chronic microvascular ischemic disease and atrophy. Electronically Signed   By: Margaretha Sheffield M.D.   On: 01/03/2021 16:43    Assessment/Plan 87 year old male with history of dementia, Parkinson's disease on no Parkinson specific meds, CKD 3B, HTN, BPH, CAD s/p CABG, s/p left hip hemiarthroplasty on 10/18 and a hospitalization complicated by urinary retention, discharged with Foley catheter to rehab, presenting with altered mental status    UTI (urinary tract infection)   History of urinary retention secondary to BPH with LUTS requiring Foley 10/25-10/31 -Patient currently does not have a Foley catheter - Continue Rocephin - Follow cultures  Acute metabolic encephalopathy - Secondary to acute infection above - Fall and aspiration precautions - Neurologic checks - We will keep n.p.o. until swallow eval  -IV hydration -Dietary and PT eval - Palliative care consult might be appropriate     Dementia due to Parkinson's disease with behavioral disturbance (HCC) -Holding oral  medications for now - Delirium precautions    Hypertension - Continue losartan    Coronary artery disease s/p CABG 2010 - Troponin slightly elevated but EKG non acute --Holding oral medications --Follows with Dr Nehemiah Massed  Stage 3b chronic kidney disease ([place]) - Renal function at baseline  S/p hemiarthroplasty 12/10/2020 - No acute issues suspected    DVT prophylaxis: Lovenox  Code Status: DNR Family Communication:  wife at bedside Disposition Plan: Back to previous home environment Consults called: none  Status:At the time of admission, it appears that the appropriate admission status for this patient is INPATIENT. This is judged to be reasonable and necessary in order to provide the required intensity of service to ensure the patient's safety given the presenting symptoms, physical exam findings, and initial radiographic and laboratory data in the context of their  Comorbid conditions.   Patient requires inpatient status due to high intensity of service, high risk for further deterioration and high frequency of surveillance required.   I certify that at the point of admission it is my clinical judgment that the patient will require inpatient hospital care spanning beyond [place] MD [facility]   01/03/2021, 8:28 PM

## 2021-01-03 NOTE — ED Provider Notes (Signed)
[facility] Emergency Department Provider Note  Time seen: 3:34 PM  I have reviewed the triage vital signs and the nursing notes.   HISTORY  Chief Complaint Altered Mental Status   HPI Shandon W Tadao Emig. is a 87 y.o. male with a past medical history of COPD, gastric reflux, hypertension, hyperlipidemia, Parkinson's, dementia, presents to the emergency department for weakness and possible chest pain.  According to the wife who is here with the patient patient has baseline dementia and is acting fairly typical to his baseline.  She reports at the rehab facility where he is currently after falling and having a hip fracture 3 weeks ago, that they could not get the patient up today due to weakness and states his blood pressure and heart rate were fluctuating.  The patient then complained of chest pain so they called EMS to bring the patient to the emergency department.  Here patient is somewhat irritated by the wires of the cardiac monitor and attempts to take his clothes off at times.  Wife states this is fairly typical where he will often times will take his clothes off as they appear to bother him.  Past Medical History:  Diagnosis Date   Arthritis    Benign prostate hyperplasia    Cataracts, bilateral    Coarse tremors    L>R hands   COPD (chronic obstructive pulmonary disease) (HCC)    first stages   Coronary artery disease    Dizziness    occasional   Gastric ulcer    in the 60's   GERD (gastroesophageal reflux disease)    takes Omeprazole every other day   Hard of hearing    doesn't wear hearing aids   History of colon polyps    History of kidney stones    still one on the left side, low   Hyperlipidemia    takes Simvastatin daily   Hypertension    stopped BP meds, BP under control   Macular degeneration    Myocardial infarction ([place]) 2009   Peripheral neuropathy    takes Gabapentin daily   Pneumonia    hx of in the 60's   Sleep apnea      Patient Active Problem List   Diagnosis Date Noted   Malnutrition of moderate degree 12/11/2020   Status post hip hemiarthroplasty 12/11/2020   Benign prostatic hyperplasia with urinary frequency    At high risk for falls 06/28/2020   Senile purpura ([place]) 06/22/2020   Aortic atherosclerosis ([place]) 06/05/2020   Scoliosis of thoracolumbar spine 06/03/2020   History of 2019 novel coronavirus disease (COVID-19) 03/13/2020   CKD (chronic kidney disease) stage 3, GFR 30-59 ml/min ([place]) 11/26/2019   Difficulty walking 08/04/2017   Expressive aphasia 08/04/2017   Parkinson disease ([place]) 08/04/2017   Advanced care planning/counseling discussion 07/21/2016   GERD (gastroesophageal reflux disease) 12/30/2015   Neuropathy 07/17/2015   Dementia due to Parkinson's disease without behavioral disturbance ([place]) 07/17/2015   Orthostatic hypotension 07/17/2015   Hypertension 07/17/2015   Coronary artery disease involving coronary bypass graft of native heart without angina pectoris 07/17/2015   Constipation due to slow transit 03/20/2013    Past Surgical History:  Procedure Laterality Date   BACK SURGERY  2007   COLONOSCOPY     CORONARY ARTERY BYPASS GRAFT  2009   LIMA to LAD, SVG to D1 07/2007   EYE SURGERY Bilateral 2004, 2005   implants   GREEN LIGHT LASER TURP (TRANSURETHRAL RESECTION OF PROSTATE N/A 05/10/2013  Procedure: GREEN LIGHT LASER TURP (TRANSURETHRAL RESECTION OF PROSTATE;  Surgeon: Ardis Hughs, MD;  Location: [facility];  Service: Urology;  Laterality: N/A;   HIP ARTHROPLASTY Left 12/10/2020   Procedure: ARTHROPLASTY BIPOLAR HIP (HEMIARTHROPLASTY);  Surgeon: Corky Mull, MD;  Location: [facility];  Service: Orthopedics;  Laterality: Left;   lipoma removed     chest wall   microthermo prostate  2009   [place] SURGERY  10/14   THORACIC DISCECTOMY N/A 03/10/2013   Procedure: Thoracic twelve-Lumbar one Laminectomy;  Surgeon: Erline Levine, MD;  Location:  [facility];  Service: Neurosurgery;  Laterality: N/A;  Thoracic twelve-Lumbar one Laminectomy   TONSILLECTOMY     at age 89    Prior to Admission medications   Medication Sig Start Date End Date Taking? Authorizing Provider  acetaminophen (TYLENOL) 500 MG tablet Take 1-2 tablets (500-1,000 mg total) by mouth every 6 (six) hours as needed. 12/13/20   Lattie Corns, PA-C  aspirin EC 81 MG tablet Take 81 mg by mouth every morning.    [provider]  docusate sodium (COLACE) 100 MG capsule Take 1 capsule (100 mg total) by mouth 2 (two) times daily. 12/17/20   Lorella Nimrod, MD  donepezil (ARICEPT) 10 MG tablet Take 1 tablet (10 mg total) by mouth at bedtime. 06/26/19   Cannady, Henrine Screws T, NP  enoxaparin (LOVENOX) 40 MG/0.4ML injection Inject 0.4 mLs (40 mg total) into the skin daily. 12/13/20   Lattie Corns, PA-C  feeding supplement (ENSURE ENLIVE / ENSURE PLUS) LIQD Take 237 mLs by mouth 2 (two) times daily between meals. 12/17/20   Lorella Nimrod, MD  gabapentin (NEURONTIN) 300 MG capsule Take 300 mg by mouth 4 (four) times daily. 06/12/19   [provider]  HYDROcodone-acetaminophen (NORCO/VICODIN) 5-325 MG tablet Take 1 tablet by mouth every 8 (eight) hours as needed for severe pain. 12/13/20   Lattie Corns, PA-C  losartan (COZAAR) 25 MG tablet Take 25 mg by mouth daily. 06/03/19   [provider]  magnesium hydroxide (MILK OF MAGNESIA) 400 MG/5ML suspension Take 30 mLs by mouth daily as needed for mild constipation. 12/17/20   Lorella Nimrod, MD  melatonin 1 MG TABS tablet Take 1 tablet (1 mg total) by mouth at bedtime as needed. 08/02/20   Cannady, Henrine Screws T, NP  methocarbamol (ROBAXIN) 500 MG tablet Take 1 tablet (500 mg total) by mouth every 6 (six) hours as needed for muscle spasms. 12/17/20   Lorella Nimrod, MD  Multiple Vitamins-Minerals (PRESERVISION AREDS 2 PO) Take 2 tablets by mouth daily.    [provider]  omeprazole (PRILOSEC) 40 MG  capsule Take 40 mg by mouth daily. 08/28/19   [provider]  ondansetron (ZOFRAN) 4 MG tablet Take 1 tablet (4 mg total) by mouth every 6 (six) hours as needed for nausea. 12/13/20   Lattie Corns, PA-C  tamsulosin (FLOMAX) 0.4 MG CAPS capsule Take 0.4 mg by mouth every evening. 11/06/20   [provider]    Allergies  Allergen Reactions   Contrast Media [Iodinated Diagnostic Agents] Hives, Itching and Rash   Codeine Nausea And Vomiting    Family History  Problem Relation Age of Onset   Hip fracture Mother    Heart disease Father    Hypertension Father    Heart attack Father    Thyroid disease Father    Cancer Sister        breast   Breast  cancer Sister     Social History Social History   Tobacco Use   Smoking status: Former    Years: 40.00    Types: Cigarettes    Quit date: 02/23/1993    Years since quitting: 27.8   Smokeless tobacco: Never  Vaping Use   Vaping Use: Never used  Substance Use Topics   Alcohol use: No   Drug use: No    Review of Systems Per wife Constitutional: No recent fever ENT: No noted congestion Cardiovascular: Patient reported chest pain earlier today. Respiratory: No apparent shortness of breath or cough Gastrointestinal: No recent vomiting or diarrhea Skin: Negative for skin complaints  All other ROS negative  ____________________________________________   PHYSICAL EXAM:  VITAL SIGNS: ED Triage Vitals  Enc Vitals Group     BP 01/03/21 1234 135/74     Pulse Rate 01/03/21 1234 90     Resp 01/03/21 1234 18     Temp 01/03/21 1234 99 F (37.2 C)     Temp Source 01/03/21 1234 Oral     SpO2 01/03/21 1234 94 %     Weight 01/03/21 1235 171 lb 15.3 oz (78 kg)     Height 01/03/21 1235 5\' 9"  (1.753 m)     Head Circumference --      Peak Flow --      Pain Score --      Pain Loc --      Pain Edu? --      Excl. in [place]? --    Constitutional: Patient is awake and alert but confused, baseline dementia.  Unable to  follow commands or answer questions appropriately. Eyes: Normal exam ENT      Head: Normocephalic and atraumatic.      Mouth/Throat: Somewhat dry appearing mucous membranes. Cardiovascular: Normal rate, regular rhythm around 100 bpm Respiratory: Normal respiratory effort without tachypnea nor retractions. Breath sounds are clear  Gastrointestinal: Soft and nontender. No distention.  No reaction to abdominal palpation Musculoskeletal: Nontender with normal range of motion in all extremities.  Neurologic:  Normal speech.  Moves all extremities. Skin: Small amount of bruising to lower abdomen which appear to be from injections. Psychiatric: Largely at baseline per wife.  ____________________________________________    EKG  EKG viewed and interpreted by myself shows atrial fibrillation at 111 bpm with a narrow QRS, normal axis, normal intervals with frequent PVC.  ____________________________________________    RADIOLOGY  CT scan of the head negative for acute abnormality  ____________________________________________   INITIAL IMPRESSION / ASSESSMENT AND PLAN / ED COURSE  Pertinent labs & imaging results that were available during my care of the patient were reviewed by me and considered in my medical decision making (see chart for details).   Patient presents to the emergency department for weakness today unable to get up at rehab due to fluctuations in blood pressure and heart rate per wife and then the patient complained of chest discomfort and was sent to the emergency department.  Patient is confused but has dementia at baseline and the wife states this is fairly typical for him from a mental state.  Patient basic labs largely within normal limits no significant findings.  We will check cardiac enzyme, EKG, urine sample and a COVID test.  We will IV hydrate while awaiting results.  Wife agreeable to plan of care.  Patient is a work-up shows a significant urinary tract infection  likely the cause of the patient's symptoms.  Remainder of the work-up is been largely nonrevealing.  Given the patient's increased weakness and confusion we will start the patient on IV Rocephin and admit to the hospitalist service.  Urine culture has been sent.  Kyaire W [name]. was evaluated in Emergency Department on 01/03/2021 for the symptoms described in the history of present illness. He was evaluated in the context of the global COVID-19 pandemic, which necessitated consideration that the patient might be at risk for infection with the SARS-CoV-2 virus that causes COVID-19. Institutional protocols and algorithms that pertain to the evaluation of patients at risk for COVID-19 are in a state of rapid change based on information released by regulatory bodies including the CDC and federal and state organizations. These policies and algorithms were followed during the patient's care in the ED.  ____________________________________________   FINAL CLINICAL IMPRESSION(S) / ED DIAGNOSES  Weakness Urinary tract infection   Harvest Dark, MD 01/03/21 2259

## 2021-01-03 NOTE — Telephone Encounter (Signed)
Bonnie from [facility] called in wanting to know if kDr Ned Card is going to follow patient with hospice. Please call back

## 2021-01-03 NOTE — ED Notes (Signed)
Pt here via EMS from [facility], c/o dehydration, abd pain LLQ, hx hip surgery a month ago, #18 RFA per EMS. NSR with multiple PVC's, 127/57; 99% RA, HR 80's; dementia baseline, however, today he isn't answering questions as appropriately. Temp 99.6. placed in recliner next to first nurse, pts daughter on the way to ED.

## 2021-01-03 NOTE — Telephone Encounter (Signed)
Patient wife was notified and verbalized understanding and has no further questions. Patient states she will try and speak with them as they are still waiting in triage currently.   Jason Cross is there anything you can help with this as far as the hospice referral. Please advise?

## 2021-01-03 NOTE — ED Triage Notes (Addendum)
See first nurse note. Pt not answering questions appropriately, dementia baseline. NAD noted.  Pt not complaining of anything In triage

## 2021-01-03 NOTE — ED Notes (Signed)
Patient changed into new adult brief; 2nd RN in to assist spouse at bedside

## 2021-01-03 NOTE — Telephone Encounter (Signed)
Copied from [place] [id number]. Topic: General - Other >> Jan 03, 2021 10:14 AM Valere Dross wrote: Reason for CRM: Pts wife called in wanting to speak with someone about getting hospice involved for the pt, please advise

## 2021-01-03 NOTE — Telephone Encounter (Signed)
Patient wife states patient has an episode of becoming agitated and his blood pressure dropped and then it went up to 150's. Wife states that [facility] facility called the EMS for patient and he is currently being triaged at [facility] and she is wondering if there is something that [name] could please do for the patient to avoid going back to the [place] facility after patient is discharged from the hospital. Patient wife also states she reached out hospice on her own and was informed that they would need a doctor's order. So, she would be interested in the referral for hospice.

## 2021-01-04 ENCOUNTER — Inpatient Hospital Stay: Payer: Medicare HMO

## 2021-01-04 ENCOUNTER — Other Ambulatory Visit: Payer: Self-pay

## 2021-01-04 DIAGNOSIS — N3000 Acute cystitis without hematuria: Secondary | ICD-10-CM | POA: Diagnosis not present

## 2021-01-04 LAB — BASIC METABOLIC PANEL
Anion gap: 7 (ref 5–15)
BUN: 40 mg/dL — ABNORMAL HIGH (ref 8–23)
CO2: 18 mmol/L — ABNORMAL LOW (ref 22–32)
Calcium: 7.8 mg/dL — ABNORMAL LOW (ref 8.9–10.3)
Chloride: 117 mmol/L — ABNORMAL HIGH (ref 98–111)
Creatinine, Ser: 1.34 mg/dL — ABNORMAL HIGH (ref 0.61–1.24)
GFR, Estimated: 50 mL/min — ABNORMAL LOW (ref >60)
Glucose, Bld: 107 mg/dL — ABNORMAL HIGH (ref 70–99)
Potassium: 4.1 mmol/L (ref 3.5–5.1)
Sodium: 142 mmol/L (ref 135–145)

## 2021-01-04 LAB — CBC
HCT: 29.4 % — ABNORMAL LOW (ref 39.0–52.0)
Hemoglobin: 9.3 g/dL — ABNORMAL LOW (ref 13.0–17.0)
MCH: 27.8 pg (ref 26.0–34.0)
MCHC: 31.6 g/dL (ref 30.0–36.0)
MCV: 88 fL (ref 80.0–100.0)
Platelets: 181 10*3/uL (ref 150–400)
RBC: 3.34 MIL/uL — ABNORMAL LOW (ref 4.22–5.81)
RDW: 14.8 % (ref 11.5–15.5)
WBC: 10 10*3/uL (ref 4.0–10.5)
nRBC: 0 % (ref 0.0–0.2)

## 2021-01-04 LAB — MRSA NEXT GEN BY PCR, NASAL: MRSA by PCR Next Gen: NOT DETECTED

## 2021-01-04 MED ORDER — GABAPENTIN 300 MG PO CAPS
300.0000 mg | ORAL_CAPSULE | Freq: Every day | ORAL | Status: DC
  Administered 2021-01-04 – 2021-01-07 (×4): 300 mg via ORAL
  Filled 2021-01-04 (×4): qty 1

## 2021-01-04 MED ORDER — CHLORHEXIDINE GLUCONATE CLOTH 2 % EX PADS
6.0000 | MEDICATED_PAD | Freq: Every day | CUTANEOUS | Status: DC
  Administered 2021-01-05 – 2021-01-08 (×4): 6 via TOPICAL

## 2021-01-04 MED ORDER — ASPIRIN EC 81 MG PO TBEC
81.0000 mg | DELAYED_RELEASE_TABLET | Freq: Every morning | ORAL | Status: DC
  Administered 2021-01-04 – 2021-01-08 (×5): 81 mg via ORAL
  Filled 2021-01-04 (×5): qty 1

## 2021-01-04 MED ORDER — DONEPEZIL HCL 5 MG PO TABS
10.0000 mg | ORAL_TABLET | Freq: Every day | ORAL | Status: DC
  Administered 2021-01-04 – 2021-01-07 (×4): 10 mg via ORAL
  Filled 2021-01-04 (×4): qty 2

## 2021-01-04 NOTE — Progress Notes (Signed)
2 1 Liter NS Boluses just completed and stopped by floor nurse as patient was admitted to floor with boluses infusing via gravity and not an IV pump. NS infusing at 24mL/hr.

## 2021-01-04 NOTE — Progress Notes (Signed)
Patient agitated, bladder distended, confused, and febrile. Tylenol effective for pain and fever. I&O performed and bladder emptied, patient did not tolerate procedure well. IVF boluses completed and IV fluid now infusing at appropriate rate. Patient cleaned, tucked into bed, and extra blankets provided for comfort.   01/04/21 0211  Assess: MEWS Score  Temp (!) 101.5 F (38.6 C)  BP (!) 141/98  Pulse Rate (!) 54  Resp 16  SpO2 96 %  O2 Device Room Air  Patient Activity (if Appropriate) In bed  Assess: MEWS Score  MEWS Temp 2  MEWS Systolic 0  MEWS Pulse 0  MEWS RR 0  MEWS LOC 1  MEWS Score 3  MEWS Score Color Yellow  Assess: if the MEWS score is Yellow or Red  Were vital signs taken at a resting state? No (agitated and combative)  Focused Assessment Change from prior assessment (see assessment flowsheet)  Does the patient meet 2 or more of the SIRS criteria? No  Does the patient have a confirmed or suspected source of infection? Yes  MEWS guidelines implemented *See Row Information* No, previously yellow, continue vital signs every 4 hours  Treat  MEWS Interventions Administered prn meds/treatments;Administered scheduled meds/treatments (Tylenol and IVF at ordered rate)  Pain Scale PAINAD  Pain Score 7  Breathing 0  Negative Vocalization 1  Facial Expression 2  Body Language 2  Consolability 2  PAINAD Score 7  Take Vital Signs  Increase Vital Sign Frequency   (Previously yellow;Patient febril and agitated, cont q4hr VS)  Escalate  MEWS: Escalate Yellow: discuss with charge nurse/RN and consider discussing with provider and RRT  Notify: Charge Nurse/RN  Name of Charge Nurse/RN Notified Psychologist, sport and exercise  Date Charge Nurse/RN Notified 01/04/21  Time Charge Nurse/RN Notified 0220  Document  Patient Outcome Stabilized after interventions  Progress note created (see row info) Yes  Assess: SIRS CRITERIA  SIRS Temperature  1  SIRS Pulse 0  SIRS Respirations  0  SIRS WBC  0  SIRS Score Sum  1

## 2021-01-04 NOTE — Progress Notes (Signed)
Patient voided and had a BM during PT session.  PVR 263ml.  Dr. Si Raider made aware. Will continue to monitor.

## 2021-01-04 NOTE — Evaluation (Signed)
Physical Therapy Evaluation Patient Details Name: Jason Cross. MRN: [id number] DOB: [birth year] Today's Date: 01/04/2021  History of Present Illness  87 y.o. male admitted on 11/11 from SNF was found to have sepsis UTI, fever, AMS, and was recent admit for L hip hemiarthroplasty after falling at home.  Home with wife who will require independent gait to go home from him.  PMHx: dementia, Parkinson's disease, CAD status post CABG, peripheral neuropathy, GERD  Clinical Impression  Pt was seen for a lengthy visit due to his incontinence and fatigue with the effort of getting OOB to side and back, but also cleaning up from a signficiant incontinent episode of feces and urine.  Pt is unable to tell when he is going to need help, and will need to wear appropriate garments for protection of this issue.  Follow along with him to get stronger and increase standing endurance, and so far have noted no orthostatic BP's.  Have not been able to get a standing value due to the time and detour of pt having incontinence and having to clean he and the bed, change clothing as well.  Repositioned on the bed with help and two person scooting up bed, then mitts replaced.  Pt is very fidgety and will definitely try to pull a line without them.  Follow for acute PT goals.  Supine BP was 124/72 and sitting was 123/81.          Recommendations for follow up therapy are one component of a multi-disciplinary discharge planning process, led by the attending physician.  Recommendations may be updated based on patient status, additional functional criteria and insurance authorization.  Follow Up Recommendations Skilled nursing-short term rehab (<3 hours/day)    Assistance Recommended at Discharge Frequent or constant Supervision/Assistance  Functional Status Assessment Patient has had a recent decline in their functional status and/or demonstrates limited ability to make significant improvements in function in a  reasonable and predictable amount of time  Equipment Recommendations  Rolling walker (2 wheels)    Recommendations for Other Services       Precautions / Restrictions Precautions Precautions: Fall Precaution Booklet Issued: Yes (comment) Precaution Comments: instructed previous encounter Required Braces or Orthoses: Other Brace Other Brace: hand mitts Restrictions Weight Bearing Restrictions: Yes LLE Weight Bearing: Weight bearing as tolerated Other Position/Activity Restrictions: posterior precautions      Mobility  Bed Mobility Overal bed mobility: Needs Assistance Bed Mobility: Rolling Rolling: Mod assist Sidelying to sit: Mod assist Supine to sit: Mod assist Sit to supine: Mod assist        Transfers Overall transfer level: Needs assistance Equipment used: Rolling walker (2 wheels) Transfers: Sit to/from Stand Sit to Stand: Max assist;+2 physical assistance;+2 safety/equipment;From elevated surface           General transfer comment: max with repetitions for his cleanup from incontinence    Ambulation/Gait               General Gait Details: unable to take a step  Stairs            Wheelchair Mobility    Modified Rankin (Stroke Patients Only)       Balance Overall balance assessment: Needs assistance;History of Falls Sitting-balance support: Feet supported Sitting balance-Leahy Scale: Fair     Standing balance support: Bilateral upper extremity supported;During functional activity Standing balance-Leahy Scale: Poor  Pertinent Vitals/Pain Pain Assessment: Faces Faces Pain Scale: Hurts a little bit Breathing: normal Negative Vocalization: none Facial Expression: smiling or inexpressive Body Language: tense, distressed pacing, fidgeting Consolability: distracted or reassured by voice/touch PAINAD Score: 2 Pain Location: L hip Pain Descriptors / Indicators: Sore Pain Intervention(s):  Monitored during session;Repositioned    Home Living Family/patient expects to be discharged to:: Private residence Living Arrangements: Spouse/significant other Available Help at Discharge: Family;Available 24 hours/day Type of Home: House Home Access: Stairs to enter Entrance Stairs-Rails: Chemical engineer of Steps: 2   Home Layout: One level Home Equipment: None Additional Comments: recent fall at SNF rolling off bed    Prior Function Prior Level of Function : Independent/Modified Independent             Mobility Comments: previously walked with no AD ADLs Comments: per wife did independently     Hand Dominance   Dominant Hand: Right    Extremity/Trunk Assessment        Lower Extremity Assessment Lower Extremity Assessment: Generalized weakness;LLE deficits/detail LLE Deficits / Details: recent L hip hemiarthroplasty    Cervical / Trunk Assessment Cervical / Trunk Assessment: Kyphotic  Communication   Communication: HOH  Cognition Arousal/Alertness: Awake/alert Behavior During Therapy: WFL for tasks assessed/performed Overall Cognitive Status: History of cognitive impairments - at baseline                                 General Comments: sleepy after visit due to fatigue        General Comments General comments (skin integrity, edema, etc.): instructed pt on sequence to stand, wife present for entire vist and discussing issues wiht previous care    Exercises     Assessment/Plan    PT Assessment Patient needs continued PT services  PT Problem List Decreased strength;Decreased range of motion;Decreased activity tolerance;Decreased balance;Decreased mobility;Decreased coordination;Decreased cognition;Decreased knowledge of use of DME;Decreased safety awareness;Decreased knowledge of precautions;Decreased skin integrity;Pain       PT Treatment Interventions DME instruction;Gait training;Stair training;Functional mobility  training;Therapeutic activities;Therapeutic exercise;Balance training;Neuromuscular re-education;Patient/family education    PT Goals (Current goals can be found in the Care Plan section)  Acute Rehab PT Goals Patient Stated Goal: none stated PT Goal Formulation: Patient unable to participate in goal setting Time For Goal Achievement: 01/06/2021 Potential to Achieve Goals: Fair    Frequency Min 2X/week   Barriers to discharge Decreased caregiver support;Inaccessible home environment home with wife and steps, she cannot assist him    Co-evaluation               AM-PAC PT "6 Clicks" Mobility  Outcome Measure Help needed turning from your back to your side while in a flat bed without using bedrails?: A Lot Help needed moving from lying on your back to sitting on the side of a flat bed without using bedrails?: A Lot Help needed moving to and from a bed to a chair (including a wheelchair)?: A Lot Help needed standing up from a chair using your arms (e.g., wheelchair or bedside chair)?: A Lot Help needed to walk in hospital room?: Total Help needed climbing 3-5 steps with a railing? : Total 6 Click Score: 10    End of Session   Activity Tolerance: Patient limited by fatigue;Patient limited by pain;Treatment limited secondary to medical complications (Comment) Patient left: in bed;with call bell/phone within reach;with bed alarm set;with family/visitor present;with restraints reapplied Nurse Communication: Mobility status;Precautions  PT Visit Diagnosis: Unsteadiness on feet (R26.81);Repeated falls (R29.6);Muscle weakness (generalized) (M62.81);Difficulty in walking, not elsewhere classified (R26.2);Pain;Dizziness and giddiness (R42) Pain - Right/Left: Left Pain - part of body: Hip    Time: 1210-1305 PT Time Calculation (min) (ACUTE ONLY): 55 min   Charges:   PT Evaluation $PT Eval Moderate Complexity: 1 Mod PT Treatments $Therapeutic Activity: 23-37 mins $Neuromuscular  Re-education: 8-22 mins       Ramond Dial 01/04/2021, 1:55 PM  Mee Hives, PT PhD Acute Rehab Dept. Number: [place] and [place]

## 2021-01-04 NOTE — Plan of Care (Signed)
Patient is more calm since spouse at the bedside.  Very confused.  Poor appetite.  Last bladder scan at 1815 was>258ml. Dr. Si Raider made aware. Will continue to monitor.

## 2021-01-04 NOTE — Evaluation (Signed)
Clinical/Bedside Swallow Evaluation Patient Details  Name: Jason Cross. MRN: [id number] Date of Birth: [birth year]  Today's Date: 01/04/2021 Time: SLP Start Time (ACUTE ONLY): 1445 SLP Stop Time (ACUTE ONLY): 1545 SLP Time Calculation (min) (ACUTE ONLY): 60 min  Past Medical History:  Past Medical History:  Diagnosis Date   Arthritis    Benign prostate hyperplasia    Cataracts, bilateral    Coarse tremors    L>R hands   COPD (chronic obstructive pulmonary disease) (HCC)    first stages   Coronary artery disease    Dizziness    occasional   Gastric ulcer    in the 60's   GERD (gastroesophageal reflux disease)    takes Omeprazole every other day   Hard of hearing    doesn't wear hearing aids   History of colon polyps    History of kidney stones    still one on the left side, low   Hyperlipidemia    takes Simvastatin daily   Hypertension    stopped BP meds, BP under control   Macular degeneration    Myocardial infarction ([place]) 2009   Peripheral neuropathy    takes Gabapentin daily   Pneumonia    hx of in the 60's   Sleep apnea    Past Surgical History:  Past Surgical History:  Procedure Laterality Date   BACK SURGERY  2007   COLONOSCOPY     CORONARY ARTERY BYPASS GRAFT  2009   LIMA to LAD, SVG to D1 07/2007   EYE SURGERY Bilateral 2004, 2005   implants   GREEN LIGHT LASER TURP (TRANSURETHRAL RESECTION OF PROSTATE N/A 05/10/2013   Procedure: GREEN LIGHT LASER TURP (TRANSURETHRAL RESECTION OF PROSTATE;  Surgeon: Ardis Hughs, MD;  Location: [facility];  Service: Urology;  Laterality: N/A;   HIP ARTHROPLASTY Left 12/10/2020   Procedure: ARTHROPLASTY BIPOLAR HIP (HEMIARTHROPLASTY);  Surgeon: Corky Mull, MD;  Location: [facility] ORS;  Service: Orthopedics;  Laterality: Left;   lipoma removed     chest wall   microthermo prostate  2009   [place] SURGERY  10/14   THORACIC DISCECTOMY N/A 03/10/2013   Procedure: Thoracic twelve-Lumbar one  Laminectomy;  Surgeon: Erline Levine, MD;  Location: [facility];  Service: Neurosurgery;  Laterality: N/A;  Thoracic twelve-Lumbar one Laminectomy   TONSILLECTOMY     at age 8   HPI:  Pt is a 87 y.o. male with medical history significant for Dementia, Parkinson's disease on no specific meds, CKD 3B, HTN, BPH, CAD s/p CABG, s/p left hip hemiarthroplasty on 10/18 and a hospitalization complicated by urinary retention, discharged with Foley catheter to rehab who presents to the ED for evaluation of altered mental status and generalized weakness.  Patient has baseline confusion with sundowning related to his dementia however for the past week he has been getting increasingly restless and agitated needing personal assistance for ambulation and transfers.  He also had little oral intake. Wife feels like he has not been getting the needed therapy at the facility, probably related to him complaining of dizziness when they stood him up for therapy. He has had no vomiting or diarrhea and no complaints of abdominal pain. He Has had a non productive cough without shortness of breath and no fever or chills.  Pt was admitted w/ dxs of UTI (urinary tract infection), urinary retention, Dehydration.  Patient started on Rocephin for UTI.  He also got a dose of IV haldol for  agitation as he was pulling on lines while in the ED.    CXR on 01/04/2021: No acute cardiopulmonary abnormality.    Assessment / Plan / Recommendation  Clinical Impression  Pt appears to present w/ mild, inconsistent oropharyngeal phase dysphagia in light of declined Cognitive status; Baseline Parkinson's Dis w/ associated Dementia. This can impact his overall awareness/timing of swallow and safety during po tasks which increases risk for aspiration, choking. Pt's risk for aspiration is present but can be reduced when following general aspiration precautions and using a modified diet consistency w/ support feeding at meals, cues for direction and  encouragement. He required MOD+ verbal/tactile/visual cues for follow through during po tasks.       Pt consumed trials of ice chips, purees, soft solids and thin liquids w/ overt coughing x1 w/ first trial of thin liquid via Cup; NO further overt, clinical s/s of aspiration noted w/ remaining tirals and consistencies; no decline in vocal quality; no cough, and no decline in respiratory status during/post trials. Oral phase was grossly adequate for bolus management and oral clearing of the boluses given. Oral prep confusion noted; aversion behaviors noted -- pt did NOT want much po's. Mastication of soft solids grossly appropriate. Pt attempted self-feeding by holding own Cup during drinking which improves safety of swallowing -- encouraged Family/Wife to do so w/ him at all times. OM Exam appeared [facility] w/ No unilateral weakness noted during bolus management and clearing. Confusion of OM tasks and oral care noted.         D/t pt's Baseline, declined Cognitive status w/ Parkinson's Dis. and Dementia as well as the increased risk for aspiration, recommend a dysphagia level 3(mech soft) w/ thin liquids w/ careful monitoring of drinking of liquids. Aspiration precautions; reduce Distractions during meals and engage pt during po's at meal for self-feeding. Pills Crushed in Puree for safer swallowing. Support w/ feeding at meals. MD/NSG updated. ST services recommends follow w/ Palliative Care for [place] and education re: impact of Cognitive decline/Dementia and Parkinson's Dis. on swallowing. ST services can follow pt at discharge to REhab for further education as needed -- pt appears at his Baseline from a dysphagia standpoint. Largely suspect that pt's Dementia and Parkinson's Dis. could continue to impact oral intake overall (aversion behaviors, desire) and swallowing. Precautions posted in room; handouts given. Education w/ Family on benefits of dysphagia diet; impact of Cognitive decline/Dementia and Parkinson's  Dis. on swallowing; aspiration precautions given. Wife agreed. SLP Visit Diagnosis: Dysphagia, oropharyngeal phase (R13.12) (baseline Dementia)    Aspiration Risk  Mild aspiration risk;Risk for inadequate nutrition/hydration (baseline)    Diet Recommendation   dysphagia level 3(mech soft) w/ thin liquids w/ careful monitoring of drinking of liquids. Aspiration precautions; monitor straw/cup use w/ thin liquids. MUST sit FULLY Upright w/ all oral intake; reduce Distractions during meals and engage pt during po's at meal for self-feeding. Support w/ feeding at meals.   Medication Administration: Crushed with puree (for safer swallowing)    Other  Recommendations Recommended Consults:  (Palliative Care for [place]) Oral Care Recommendations: Oral care BID;Oral care before and after PO;Staff/trained caregiver to provide oral care Other Recommendations:  (n/a)    Recommendations for follow up therapy are one component of a multi-disciplinary discharge planning process, led by the attending physician.  Recommendations may be updated based on patient status, additional functional criteria and insurance authorization.  Follow up Recommendations No SLP follow up      Assistance Recommended at Discharge None  Functional  Status Assessment Patient has had a recent decline in their functional status and/or demonstrates limited ability to make significant improvements in function in a reasonable and predictable amount of time  Frequency and Duration  (n/a)   (n/a)       Prognosis Prognosis for Safe Diet Advancement: Guarded Barriers to Reach Goals: Cognitive deficits;Language deficits;Time post onset;Severity of deficits;Behavior Barriers/Prognosis Comment: pt does not want much po's; aversion behaviors noted      Swallow Study   General Date of Onset: 01/03/21 HPI: Pt is a 87 y.o. male with medical history significant for Dementia, Parkinson's disease on no specific meds, CKD 3B, HTN, BPH, CAD s/p  CABG, s/p left hip hemiarthroplasty on 10/18 and a hospitalization complicated by urinary retention, discharged with Foley catheter to rehab who presents to the ED for evaluation of altered mental status and generalized weakness.  Patient has baseline confusion with sundowning related to his dementia however for the past week he has been getting increasingly restless and agitated needing personal assistance for ambulation and transfers.  He also had little oral intake. Wife feels like he has not been getting the needed therapy at the facility, probably related to him complaining of dizziness when they stood him up for therapy. He has had no vomiting or diarrhea and no complaints of abdominal pain. He Has had a non productive cough without shortness of breath and no fever or chills.  Pt was admitted w/ dxs of UTI (urinary tract infection), urinary retention, Dehydration.  Patient started on Rocephin for UTI.  He also got a dose of IV haldol for agitation as he was pulling on lines while in the ED.    CXR on 01/04/2021: No acute cardiopulmonary abnormality. Type of Study: Bedside Swallow Evaluation Previous Swallow Assessment: none Diet Prior to this Study: Regular;Thin liquids Temperature Spikes Noted: No (wbc 10.0) Respiratory Status: Room air History of Recent Intubation: No Behavior/Cognition: Alert;Cooperative;Pleasant mood;Confused;Distractible;Requires cueing (pt did not want much po) Oral Cavity Assessment: Within Functional Limits Oral Care Completed by SLP: Recent completion by staff Oral Cavity - Dentition: Dentures, top;Dentures, bottom Vision: Impaired for self-feeding (macular degeneration per Wife) Self-Feeding Abilities: Needs assist;Needs set up;Total assist (could hold Cup to drink) Patient Positioning: Upright in bed (needed positioning and encouragement) Baseline Vocal Quality: Low vocal intensity (mumbled speech) Volitional Cough: Cognitively unable to elicit Volitional Swallow:  Unable to elicit    Oral/Motor/Sensory Function Overall Oral Motor/Sensory Function:  (no overt weakness noted during bolus management and clearing)   Ice Chips Ice chips: Within functional limits Presentation: Spoon (fed; 2 trials)   Thin Liquid Thin Liquid: Impaired Presentation: Cup;Self Fed;Straw (4 via Cup; 4 via Straw) Oral Phase Impairments: Poor awareness of bolus (oral prep) Oral Phase Functional Implications:  (none) Pharyngeal  Phase Impairments: Cough - Delayed (x1 w/ cup drinking) Other Comments: Wife stated pt has been using the straw most often and does "well" w/ it when drinking    Nectar Thick Nectar Thick Liquid: Not tested   Honey Thick Honey Thick Liquid: Not tested   Puree Puree: Within functional limits Presentation: Spoon (fed; 4 trials)   Solid     Solid: Impaired Presentation: Spoon (fed; 2 trials) Oral Phase Impairments: Poor awareness of bolus (did not want; oral prep) Oral Phase Functional Implications: Prolonged oral transit;Impaired mastication Pharyngeal Phase Impairments:  (none) Other Comments: expectorated 1 bolus -- "too slimey"       Orinda Kenner, MS, [employer] Speech Language Pathologist Rehab Services [phone] Kiela Shisler 01/04/2021,4:28 PM

## 2021-01-04 NOTE — Progress Notes (Signed)
PROGRESS NOTE    Jason Cross.  ACZ:[id number] DOB: 1928-05-12 DOA: 01/03/2021 PCP: Venita Lick, NP  Outpatient Specialists: neurology, cardiology    Brief Narrative:   From admission h and p Jason Cross. is a 87 y.o. male with medical history significant for Dementia, Parkinson's disease on no specific meds, CKD 3B, HTN, BPH, CAD s/p CABG, s/p left hip hemiarthroplasty on 10/18 and a hospitalization complicated by urinary retention, discharged with Foley catheter to rehab who presents to the ED for evaluation of altered mental status and generalized weakness.  Patient has baseline confusion with sundowning related to his dementia however for the past week he has been getting increasingly restless and agitated needing personal assistance for ambulation and transfers.  He also had little oral intake. Wife feels like he has not been getting the needed therapy at the facility, probably related to him complaining of dizziness when they stood him up for therapy. He has had no vomiting or diarrhea and no complaints of abdominal pain. He Has had a non productive cough without shortness of breath and no fever or chills.     Assessment & Plan:   Principal Problem:   UTI (urinary tract infection) Active Problems:   Dementia due to Parkinson's disease with behavioral disturbance (HCC)   Hypertension   Coronary artery disease involving coronary bypass graft of native heart without angina pectoris   Stage 3b chronic kidney disease (HCC)   Generalized weakness   Status post hip hemiarthroplasty   Acute metabolic encephalopathy   History of urinary retention  # Acute cystitis # Sepsis Sepsis by fever, encephalopathy. Chronic urinary retention likely contributing. Covid/flu neg, no respiratory of GI symptoms - will complete w/u with cxr and blood cultures - f/u urine culture - continue ceftriaxone  # Urinary retention Hx of, likely cause of infection. Discharged last  month with foley which was removed at snf. Followed outpt by urology. Has required I/o cath here x2. Kidney function is at baseline. - nurse to check bladder scan, if greater than 300 will place foley - conf flomax  # Acute metabolic encephalopathy Underlying advanced dementia. 2/2 delirium, infection - treat underlying causes  # End-of-life care DNR, wife wants treat-the-treatable approach for now, is interested in hospice services - palliative consult - TOC consult, wife wants to explore snf vs home-based services  # CAD  S/p cabg. Appears stable - home aspirin  # Recent left hip arthroplasty On 10/18 - pt consult - plan was for ortho f/u in 4-6 weeks, may touch base w/ them while he's in house  # HFrEF Ef 30-35% in 2021. Appears compensated/euvolemic - monitor - followed by cardiology as outpt. Not on guideline-based therapy possibly 2/2 advanced comorbidities  # Dementia - home aricept - slp swallow eval; dysphagia 3 diet for now  # Neuropathy - decrease home gabapentin from 300/600 to 300 qhs  # CKD 3a At baseline - monitor    DVT prophylaxis: lovenox Code Status: dnr Family Communication: wife updated @ bedside 11/12  Level of care: Telemetry Medical Status is: Inpatient  Remains inpatient appropriate because: severity of illness        Consultants:  palliative  Procedures: none  Antimicrobials:  Ceftriaxone 11/11>    Subjective: Comfortable in bed, demented.  Objective: Vitals:   01/04/21 0211 01/04/21 0416 01/04/21 0805 01/04/21 0810  BP: (!) 141/98  120/86   Pulse: (!) 54  (!) 33 72  Resp: 16  16   Temp: (!)  101.5 F (38.6 C) 98.4 F (36.9 C) 97.8 F (36.6 C)   TempSrc: Axillary Axillary    SpO2: 96%  97%   Weight: 71.2 kg     Height: 5\' 9"  (1.753 m)       Intake/Output Summary (Last 24 hours) at 01/04/2021 1053 Last data filed at 01/04/2021 0323 Gross per 24 hour  Intake 1511.25 ml  Output 700 ml  Net 811.25 ml    Filed Weights   01/03/21 1235 01/04/21 0211  Weight: 78 kg 71.2 kg    Examination:  General exam: Appears calm and comfortable  Respiratory system: Clear to auscultation. Respiratory effort normal. Cardiovascular system: S1 & S2 heard, RRR. No JVD, murmurs, rubs, gallops or clicks. No pedal edema. Gastrointestinal system: Abdomen is nondistended, soft and nontender. No organomegaly or masses felt. Normal bowel sounds heard. Central nervous system: Alert and oriented. No focal neurological deficits. Extremities: Symmetric 5 x 5 power. Skin: No rashes, lesions or ulcers Psychiatry: confused    Data Reviewed: I have personally reviewed following labs and imaging studies  CBC: Recent Labs  Lab 01/03/21 1237 01/04/21 0453  WBC 12.0* 10.0  HGB 11.3* 9.3*  HCT 35.4* 29.4*  MCV 88.9 88.0  PLT 236 235   Basic Metabolic Panel: Recent Labs  Lab 01/03/21 1237 01/04/21 0453  NA 138 142  K 4.3 4.1  CL 108 117*  CO2 22 18*  GLUCOSE 108* 107*  BUN 40* 40*  CREATININE 1.52* 1.34*  CALCIUM 9.0 7.8*   GFR: Estimated Creatinine Clearance: 36.6 mL/min (A) (by C-G formula based on SCr of 1.34 mg/dL (H)). Liver Function Tests: Recent Labs  Lab 01/03/21 1237  AST 17  ALT 16  ALKPHOS 187*  BILITOT 1.4*  PROT 6.4*  ALBUMIN 3.0*   No results for input(s): LIPASE, AMYLASE in the last 168 hours. No results for input(s): AMMONIA in the last 168 hours. Coagulation Profile: No results for input(s): INR, PROTIME in the last 168 hours. Cardiac Enzymes: No results for input(s): CKTOTAL, CKMB, CKMBINDEX, TROPONINI in the last 168 hours. BNP (last 3 results) No results for input(s): PROBNP in the last 8760 hours. HbA1C: No results for input(s): HGBA1C in the last 72 hours. CBG: No results for input(s): GLUCAP in the last 168 hours. Lipid Profile: No results for input(s): CHOL, HDL, LDLCALC, TRIG, CHOLHDL, LDLDIRECT in the last 72 hours. Thyroid Function Tests: No results for  input(s): TSH, T4TOTAL, FREET4, T3FREE, THYROIDAB in the last 72 hours. Anemia Panel: No results for input(s): VITAMINB12, FOLATE, FERRITIN, TIBC, IRON, RETICCTPCT in the last 72 hours. Urine analysis:    Component Value Date/Time   COLORURINE YELLOW (A) 01/03/2021 1534   APPEARANCEUR CLOUDY (A) 01/03/2021 1534   APPEARANCEUR Clear 06/28/2020 1532   LABSPEC 1.014 01/03/2021 1534   PHURINE 5.0 01/03/2021 1534   GLUCOSEU NEGATIVE 01/03/2021 1534   HGBUR MODERATE (A) 01/03/2021 1534   BILIRUBINUR NEGATIVE 01/03/2021 1534   BILIRUBINUR Negative 06/28/2020 1532   KETONESUR NEGATIVE 01/03/2021 1534   PROTEINUR 30 (A) 01/03/2021 1534   UROBILINOGEN 0.2 03/26/2013 1331   NITRITE NEGATIVE 01/03/2021 1534   LEUKOCYTESUR LARGE (A) 01/03/2021 1534   Sepsis Labs: @LABRCNTIP (procalcitonin:4,lacticidven:4)  ) Recent Results (from the past 240 hour(s))  Resp Panel by RT-PCR (Flu A&B, Covid) Nasopharyngeal Swab     Status: None   Collection Time: 01/03/21  3:34 PM   Specimen: Nasopharyngeal Swab; Nasopharyngeal(NP) swabs in vial transport medium  Result Value Ref Range Status   SARS Coronavirus 2  by RT PCR NEGATIVE NEGATIVE Final    Comment: (NOTE) SARS-CoV-2 target nucleic acids are NOT DETECTED.  The SARS-CoV-2 RNA is generally detectable in upper respiratory specimens during the acute phase of infection. The lowest concentration of SARS-CoV-2 viral copies this assay can detect is 138 copies/mL. A negative result does not preclude SARS-Cov-2 infection and should not be used as the sole basis for treatment or other patient management decisions. A negative result may occur with  improper specimen collection/handling, submission of specimen other than nasopharyngeal swab, presence of viral mutation(s) within the areas targeted by this assay, and inadequate number of viral copies(<138 copies/mL). A negative result must be combined with clinical observations, patient history, and  epidemiological information. The expected result is Negative.  Fact Sheet for Patients:  EntrepreneurPulse.com.au  Fact Sheet for Healthcare Providers:  IncredibleEmployment.be  This test is no t yet approved or cleared by the [place] FDA and  has been authorized for detection and/or diagnosis of SARS-CoV-2 by FDA under an Emergency Use Authorization (EUA). This EUA will remain  in effect (meaning this test can be used) for the duration of the COVID-19 declaration under Section 564(b)(1) of the Act, 21 U.S.C.section 360bbb-3(b)(1), unless the authorization is terminated  or revoked sooner.       Influenza A by PCR NEGATIVE NEGATIVE Final   Influenza B by PCR NEGATIVE NEGATIVE Final    Comment: (NOTE) The Xpert Xpress SARS-CoV-2/FLU/RSV plus assay is intended as an aid in the diagnosis of influenza from Nasopharyngeal swab specimens and should not be used as a sole basis for treatment. Nasal washings and aspirates are unacceptable for Xpert Xpress SARS-CoV-2/FLU/RSV testing.  Fact Sheet for Patients: EntrepreneurPulse.com.au  Fact Sheet for Healthcare Providers: IncredibleEmployment.be  This test is not yet approved or cleared by the [place] FDA and has been authorized for detection and/or diagnosis of SARS-CoV-2 by FDA under an Emergency Use Authorization (EUA). This EUA will remain in effect (meaning this test can be used) for the duration of the COVID-19 declaration under Section 564(b)(1) of the Act, 21 U.S.C. section 360bbb-3(b)(1), unless the authorization is terminated or revoked.  Performed at [facility], [place] [place], [place] [address]          Radiology Studies: CT HEAD WO CONTRAST (5MM)  Result Date: 01/03/2021 CLINICAL DATA:  Mental status change, unknown cause EXAM: CT HEAD WITHOUT CONTRAST TECHNIQUE: Contiguous axial images were obtained from the  base of the skull through the vertex without intravenous contrast. COMPARISON:  December 10, 2020. FINDINGS: Brain: No evidence of acute large vascular territory infarction, hemorrhage, hydrocephalus, extra-axial collection or mass lesion/mass effect. Similar moderate patchy white matter hypoattenuation, nonspecific but compatible with chronic microvascular disease. Similar atrophy. Vascular: No hyperdense vessel. Calcific intracranial atherosclerosis. Skull: No evidence of acute fracture. Sinuses/Orbits: Mucosal thickening in the sphenoid sinuses. No acute orbital findings. Other: No mastoid effusions. IMPRESSION: 1. No evidence of acute intracranial abnormality. 2. Similar chronic microvascular ischemic disease and atrophy. Electronically Signed   By: Margaretha Sheffield M.D.   On: 01/03/2021 16:43        Scheduled Meds:  enoxaparin (LOVENOX) injection  40 mg Subcutaneous Q24H   tamsulosin  0.4 mg Oral QPM   Continuous Infusions:  sodium chloride 75 mL/hr at 01/04/21 0416   cefTRIAXone (ROCEPHIN)  IV       LOS: 1 day    Time spent: 40 min    Desma Maxim, MD [facility]   If 7PM-7AM, please  contact night-coverage [email or web address] Password [facility] 01/04/2021, 10:53 AM

## 2021-01-05 DIAGNOSIS — N3000 Acute cystitis without hematuria: Secondary | ICD-10-CM | POA: Diagnosis not present

## 2021-01-05 LAB — BASIC METABOLIC PANEL
Anion gap: 9 (ref 5–15)
BUN: 36 mg/dL — ABNORMAL HIGH (ref 8–23)
CO2: 18 mmol/L — ABNORMAL LOW (ref 22–32)
Calcium: 8.3 mg/dL — ABNORMAL LOW (ref 8.9–10.3)
Chloride: 115 mmol/L — ABNORMAL HIGH (ref 98–111)
Creatinine, Ser: 1.27 mg/dL — ABNORMAL HIGH (ref 0.61–1.24)
GFR, Estimated: 54 mL/min — ABNORMAL LOW (ref >60)
Glucose, Bld: 96 mg/dL (ref 70–99)
Potassium: 3.7 mmol/L (ref 3.5–5.1)
Sodium: 142 mmol/L (ref 135–145)

## 2021-01-05 MED ORDER — BENZOCAINE 10 % MT GEL
Freq: Two times a day (BID) | OROMUCOSAL | Status: DC | PRN
  Filled 2021-01-05: qty 9

## 2021-01-05 MED ORDER — MELATONIN 5 MG PO TABS
5.0000 mg | ORAL_TABLET | Freq: Every day | ORAL | Status: DC
  Administered 2021-01-05 – 2021-01-07 (×3): 5 mg via ORAL
  Filled 2021-01-05 (×4): qty 1

## 2021-01-05 NOTE — Progress Notes (Addendum)
PROGRESS NOTE    Jason Cross.  UDJ:[id number] DOB: Sep 02, 1928 DOA: 01/03/2021 PCP: Venita Lick, NP  Outpatient Specialists: neurology, cardiology    Brief Narrative:   From admission h and p Jason Cross. is a 87 y.o. male with medical history significant for Dementia, Parkinson's disease on no specific meds, CKD 3B, HTN, BPH, CAD s/p CABG, s/p left hip hemiarthroplasty on 10/18 and a hospitalization complicated by urinary retention, discharged with Foley catheter to rehab who presents to the ED for evaluation of altered mental status and generalized weakness.  Patient has baseline confusion with sundowning related to his dementia however for the past week he has been getting increasingly restless and agitated needing personal assistance for ambulation and transfers.  He also had little oral intake. Wife feels like he has not been getting the needed therapy at the facility, probably related to him complaining of dizziness when they stood him up for therapy. He has had no vomiting or diarrhea and no complaints of abdominal pain. He Has had a non productive cough without shortness of breath and no fever or chills.     Assessment & Plan:   Principal Problem:   UTI (urinary tract infection) Active Problems:   Dementia due to Parkinson's disease with behavioral disturbance (HCC)   Hypertension   Coronary artery disease involving coronary bypass graft of native heart without angina pectoris   Stage 3b chronic kidney disease (HCC)   Generalized weakness   Status post hip hemiarthroplasty   Acute metabolic encephalopathy   History of urinary retention  # Acute cystitis # Sepsis Sepsis by fever, encephalopathy. Chronic urinary retention likely contributing. Covid/flu neg, no respiratory of GI symptoms. Sepsis physiology resolved - f/u cultures, urine growing GNRs - continue ceftriaxone for now  # Urinary retention Hx of, likely cause of infection. Discharged last  month with foley which was removed at snf. Followed outpt by urology. Has required I/o cath here x2. Retained overnight, Foley placed - maintain Foley; outpt urology f/u - conf flomax  # Acute metabolic encephalopathy Underlying advanced dementia. 2/2 delirium, infection - treat underlying causes  # Denture-associated ulcer - benzocaine gel ordered  # End-of-life care DNR, wife wants treat-the-treatable approach for now, is interested in hospice services, SNF - palliative consult - TOC consult, wife wants to explore snf vs home-based services  # CAD  S/p cabg. Appears stable - home aspirin  # Recent left hip arthroplasty On 10/18 - pt consult - plan was for ortho f/u in 4-6 weeks, may touch base w/ them while he's in house  # HFrEF Ef 30-35% in 2021. Appears compensated/euvolemic - monitor - followed by cardiology as outpt. Not on guideline-based therapy possibly 2/2 advanced comorbidities  # Dementia - home aricept - slp swallow eval; dysphagia 3 diet for now  # Neuropathy - decrease home gabapentin from 300/600 to 300 qhs  # CKD 3a At baseline - monitor    DVT prophylaxis: lovenox Code Status: dnr Family Communication: wife updated @ bedside 11/13  Level of care: Telemetry Medical Status is: Inpatient  Remains inpatient appropriate because: severity of illness        Consultants:  palliative  Procedures: none  Antimicrobials:  Ceftriaxone 11/11>    Subjective: Comfortable in bed, demented.  Objective: Vitals:   01/05/21 0046 01/05/21 0523 01/05/21 0745 01/05/21 1120  BP: 137/90 (!) 151/72 (!) 143/95 (!) 158/84  Pulse: 96 93 98 (!) 41  Resp: 18 18 16 18   Temp:  98.7 F (37.1 C) 98.3 F (36.8 C) 98.6 F (37 C) 98.2 F (36.8 C)  TempSrc: Oral Oral Oral Oral  SpO2: 97% 98% 97% (!) 88%  Weight:      Height:        Intake/Output Summary (Last 24 hours) at 01/05/2021 1135 Last data filed at 01/05/2021 0834 Gross per 24 hour  Intake  820.05 ml  Output 2900 ml  Net -2079.95 ml   Filed Weights   01/03/21 1235 01/04/21 0211  Weight: 78 kg 71.2 kg    Examination:  General exam: Appears calm and comfortable  ENT: 2 shallow ulcers upper anterior gum Respiratory system: Clear to auscultation. Respiratory effort normal. Cardiovascular system: S1 & S2 heard, RRR. No JVD, murmurs, rubs, gallops or clicks. No pedal edema. Gastrointestinal system: Abdomen is nondistended, soft and nontender. No organomegaly or masses felt. Normal bowel sounds heard. Central nervous system: Alert and oriented. No focal neurological deficits. Extremities: Symmetric 5 x 5 power. Skin: No rashes, lesions or ulcers Psychiatry: confused    Data Reviewed: I have personally reviewed following labs and imaging studies  CBC: Recent Labs  Lab 01/03/21 1237 01/04/21 0453  WBC 12.0* 10.0  HGB 11.3* 9.3*  HCT 35.4* 29.4*  MCV 88.9 88.0  PLT 236 329   Basic Metabolic Panel: Recent Labs  Lab 01/03/21 1237 01/04/21 0453 01/05/21 0433  NA 138 142 142  K 4.3 4.1 3.7  CL 108 117* 115*  CO2 22 18* 18*  GLUCOSE 108* 107* 96  BUN 40* 40* 36*  CREATININE 1.52* 1.34* 1.27*  CALCIUM 9.0 7.8* 8.3*   GFR: Estimated Creatinine Clearance: 38.7 mL/min (A) (by C-G formula based on SCr of 1.27 mg/dL (H)). Liver Function Tests: Recent Labs  Lab 01/03/21 1237  AST 17  ALT 16  ALKPHOS 187*  BILITOT 1.4*  PROT 6.4*  ALBUMIN 3.0*   No results for input(s): LIPASE, AMYLASE in the last 168 hours. No results for input(s): AMMONIA in the last 168 hours. Coagulation Profile: No results for input(s): INR, PROTIME in the last 168 hours. Cardiac Enzymes: No results for input(s): CKTOTAL, CKMB, CKMBINDEX, TROPONINI in the last 168 hours. BNP (last 3 results) No results for input(s): PROBNP in the last 8760 hours. HbA1C: No results for input(s): HGBA1C in the last 72 hours. CBG: No results for input(s): GLUCAP in the last 168 hours. Lipid  Profile: No results for input(s): CHOL, HDL, LDLCALC, TRIG, CHOLHDL, LDLDIRECT in the last 72 hours. Thyroid Function Tests: No results for input(s): TSH, T4TOTAL, FREET4, T3FREE, THYROIDAB in the last 72 hours. Anemia Panel: No results for input(s): VITAMINB12, FOLATE, FERRITIN, TIBC, IRON, RETICCTPCT in the last 72 hours. Urine analysis:    Component Value Date/Time   COLORURINE YELLOW (A) 01/03/2021 1534   APPEARANCEUR CLOUDY (A) 01/03/2021 1534   APPEARANCEUR Clear 06/28/2020 1532   LABSPEC 1.014 01/03/2021 1534   PHURINE 5.0 01/03/2021 1534   GLUCOSEU NEGATIVE 01/03/2021 1534   HGBUR MODERATE (A) 01/03/2021 1534   BILIRUBINUR NEGATIVE 01/03/2021 1534   BILIRUBINUR Negative 06/28/2020 1532   KETONESUR NEGATIVE 01/03/2021 1534   PROTEINUR 30 (A) 01/03/2021 1534   UROBILINOGEN 0.2 03/26/2013 1331   NITRITE NEGATIVE 01/03/2021 1534   LEUKOCYTESUR LARGE (A) 01/03/2021 1534   Sepsis Labs: @LABRCNTIP (procalcitonin:4,lacticidven:4)  ) Recent Results (from the past 240 hour(s))  Resp Panel by RT-PCR (Flu A&B, Covid) Nasopharyngeal Swab     Status: None   Collection Time: 01/03/21  3:34 PM   Specimen: Nasopharyngeal Swab;  Nasopharyngeal(NP) swabs in vial transport medium  Result Value Ref Range Status   SARS Coronavirus 2 by RT PCR NEGATIVE NEGATIVE Final    Comment: (NOTE) SARS-CoV-2 target nucleic acids are NOT DETECTED.  The SARS-CoV-2 RNA is generally detectable in upper respiratory specimens during the acute phase of infection. The lowest concentration of SARS-CoV-2 viral copies this assay can detect is 138 copies/mL. A negative result does not preclude SARS-Cov-2 infection and should not be used as the sole basis for treatment or other patient management decisions. A negative result may occur with  improper specimen collection/handling, submission of specimen other than nasopharyngeal swab, presence of viral mutation(s) within the areas targeted by this assay, and  inadequate number of viral copies(<138 copies/mL). A negative result must be combined with clinical observations, patient history, and epidemiological information. The expected result is Negative.  Fact Sheet for Patients:  EntrepreneurPulse.com.au  Fact Sheet for Healthcare Providers:  IncredibleEmployment.be  This test is no t yet approved or cleared by the [place] FDA and  has been authorized for detection and/or diagnosis of SARS-CoV-2 by FDA under an Emergency Use Authorization (EUA). This EUA will remain  in effect (meaning this test can be used) for the duration of the COVID-19 declaration under Section 564(b)(1) of the Act, 21 U.S.C.section 360bbb-3(b)(1), unless the authorization is terminated  or revoked sooner.       Influenza A by PCR NEGATIVE NEGATIVE Final   Influenza B by PCR NEGATIVE NEGATIVE Final    Comment: (NOTE) The Xpert Xpress SARS-CoV-2/FLU/RSV plus assay is intended as an aid in the diagnosis of influenza from Nasopharyngeal swab specimens and should not be used as a sole basis for treatment. Nasal washings and aspirates are unacceptable for Xpert Xpress SARS-CoV-2/FLU/RSV testing.  Fact Sheet for Patients: EntrepreneurPulse.com.au  Fact Sheet for Healthcare Providers: IncredibleEmployment.be  This test is not yet approved or cleared by the [place] FDA and has been authorized for detection and/or diagnosis of SARS-CoV-2 by FDA under an Emergency Use Authorization (EUA). This EUA will remain in effect (meaning this test can be used) for the duration of the COVID-19 declaration under Section 564(b)(1) of the Act, 21 U.S.C. section 360bbb-3(b)(1), unless the authorization is terminated or revoked.  Performed at [facility], [address] [place], [place] [address]   Urine Culture     Status: Abnormal (Preliminary result)   Collection Time: 01/03/21  7:42  PM   Specimen: Urine, Random  Result Value Ref Range Status   Specimen Description   Final    URINE, RANDOM Performed at [facility], [address] [place], [place] [address]    Special Requests   Final    NONE Performed at [facility], [address], [place], [place] [address]    Culture (A)  Final    >=100,000 COLONIES/mL GRAM NEGATIVE RODS CULTURE REINCUBATED FOR BETTER GROWTH SUSCEPTIBILITIES TO FOLLOW Performed at [facility], [place] [address] [place],  [address]    Report Status PENDING  Incomplete  CULTURE, BLOOD (ROUTINE X 2) w Reflex to ID Panel     Status: None (Preliminary result)   Collection Time: 01/04/21 11:19 AM   Specimen: BLOOD  Result Value Ref Range Status   Specimen Description BLOOD RIGHT HAND  Final   Special Requests   Final    BOTTLES DRAWN AEROBIC AND ANAEROBIC Blood Culture adequate volume   Culture   Final    NO GROWTH < 24 HOURS Performed at [facility], [address] [place], [place] [address]    Report Status PENDING  Incomplete  CULTURE, BLOOD (ROUTINE X 2) w Reflex to ID Panel     Status: None (Preliminary result)   Collection Time: 01/04/21 11:19 AM   Specimen: BLOOD  Result Value Ref Range Status   Specimen Description BLOOD  LEFT HAND  Final   Special Requests   Final    BOTTLES DRAWN AEROBIC AND ANAEROBIC Blood Culture results may not be optimal due to an excessive volume of blood received in culture bottles   Culture   Final    NO GROWTH < 24 HOURS Performed at [facility] [address], [place], [place] [address]    Report Status PENDING  Incomplete  MRSA Next Gen by PCR, Nasal     Status: None   Collection Time: 01/04/21  7:27 PM   Specimen: Nasal Mucosa; Nasal Swab  Result Value Ref Range Status   MRSA by PCR Next Gen NOT DETECTED NOT DETECTED Final    Comment: (NOTE) The GeneXpert MRSA Assay (FDA approved for NASAL specimens only), is one component of a  comprehensive MRSA colonization surveillance program. It is not intended to diagnose MRSA infection nor to guide or monitor treatment for MRSA infections. Test performance is not FDA approved in patients less than 2 years old. Performed at [facility], [address] [place], [place] [address]          Radiology Studies: CT HEAD WO CONTRAST (5MM)  Result Date: 01/03/2021 CLINICAL DATA:  Mental status change, unknown cause EXAM: CT HEAD WITHOUT CONTRAST TECHNIQUE: Contiguous axial images were obtained from the base of the skull through the vertex without intravenous contrast. COMPARISON:  December 10, 2020. FINDINGS: Brain: No evidence of acute large vascular territory infarction, hemorrhage, hydrocephalus, extra-axial collection or mass lesion/mass effect. Similar moderate patchy white matter hypoattenuation, nonspecific but compatible with chronic microvascular disease. Similar atrophy. Vascular: No hyperdense vessel. Calcific intracranial atherosclerosis. Skull: No evidence of acute fracture. Sinuses/Orbits: Mucosal thickening in the sphenoid sinuses. No acute orbital findings. Other: No mastoid effusions. IMPRESSION: 1. No evidence of acute intracranial abnormality. 2. Similar chronic microvascular ischemic disease and atrophy. Electronically Signed   By: Margaretha Sheffield M.D.   On: 01/03/2021 16:43   DG Chest Port 1 View  Result Date: 01/04/2021 CLINICAL DATA:  Fever EXAM: PORTABLE CHEST 1 VIEW COMPARISON:  December 10, 2020 FINDINGS: The cardiomediastinal silhouette is unchanged in contour.Status post median sternotomy and CABG. Atherosclerotic calcifications of the aorta. No pleural effusion. No pneumothorax. No acute pleuroparenchymal abnormality. Visualized abdomen is unremarkable. Multilevel degenerative changes of the thoracic spine. IMPRESSION: No acute cardiopulmonary abnormality. Electronically Signed   By: Valentino Saxon M.D.   On: 01/04/2021 11:50         Scheduled Meds:  aspirin EC  81 mg Oral q morning   Chlorhexidine Gluconate Cloth  6 each Topical Daily   donepezil  10 mg Oral QHS   enoxaparin (LOVENOX) injection  40 mg Subcutaneous Q24H   gabapentin  300 mg Oral QHS   tamsulosin  0.4 mg Oral QPM   Continuous Infusions:  cefTRIAXone (ROCEPHIN)  IV Stopped (01/04/21 2118)     LOS: 2 days    Time spent: 30 min    Desma Maxim, MD [facility]   If 7PM-7AM, please contact night-coverage [email or web address] Password [email or web address] 01/05/2021, 11:35 AM

## 2021-01-05 NOTE — TOC Initial Note (Addendum)
Transition of Care (TOC) - Initial/Assessment Note    Patient Details  Name: Jason Cross. MRN: [id number] Date of Birth: 03-19-1928  Transition of Care [facility]) CM/SW Contact:    Izola Price, RN Phone Number: 01/05/2021, 1:12 PM  Clinical Narrative: 1/13: Patient was admitted 11/11 from [facility] with likely UTI pending cultures.  Foley in place at this time.  Patient has PMH htx of Parkinson's, dementia, neuropathy.  Patient was able to get around with some help prior to admission to [facility] for hips surgery.  Was transferred to [facility] for STR and was there 17 days.  Spouse found patient "hanging out of bed, soiled in urine and bowel movement".  She also stated they were not doing therapy, stating his BP was low and canceling therapies so her observation is that he did not receive enough therapy to show improvement before showing signs of UTI.  Spouse is concerned about safe return to current facility, unsure if she could do [facility] and would need all services if so; or another facility or [place] facility (60% connected via [place] area).  Spouse has been unable to reach a SW at the [place].  CM emailed [place] contact, Steva Ready, to see if some assistance/guidance for spouse or CM for placement available. Discussed options with spouse between [facility], Hospice, STR, [place]. TOC to follow up Monday CM.  Spouse can drive patient or drive to visit as far as [place] for [place] placement if necessary.  No issues getting medications,  PCP DR. [name] Cannady. [id number] [place], [place] - [address] Has used [facility] in the past.  Hospice note in chart re: seeing patient on discharge? Spouse was unsure about this.  Simmie Davies RN CM                   Expected Discharge Plan: Skilled Nursing Facility Barriers to Discharge: Continued Medical Work up   Patient Goals and CMS Choice        Expected Discharge Plan and Services Expected Discharge Plan: [place]   Discharge  Planning Services: CM Consult   Living arrangements for the past 2 months: [place] ([facility] for 17 days)                                      Prior Living Arrangements/Services Living arrangements for the past 2 months: [place] ([facility] for 17 days) Lives with:: Spouse (When at home.) Patient language and need for interpreter reviewed:: No        Need for Family Participation in Patient Care: Yes (Comment) Care giver support system in place?: Yes (comment) (BUt not enough help, just spouse.)   Criminal Activity/Legal Involvement Pertinent to Current Situation/Hospitalization: No - Comment as needed  Activities of Daily Living   ADL Screening (condition at time of admission) Patient's cognitive ability adequate to safely complete daily activities?: No Is the patient deaf or have difficulty hearing?: Yes Does the patient have difficulty seeing, even when wearing glasses/contacts?: Yes Does the patient have difficulty concentrating, remembering, or making decisions?: Yes Patient able to express need for assistance with ADLs?: No Does the patient have difficulty dressing or bathing?: Yes Independently performs ADLs?: No Communication: Independent Dressing (OT): Needs assistance Is this a change from baseline?: Pre-admission baseline Grooming: Needs assistance Is this a change from baseline?: Pre-admission baseline Feeding: Needs  assistance Is this a change from baseline?: Pre-admission baseline Bathing: Needs assistance Is this a change from baseline?: Pre-admission baseline Toileting: Needs assistance Is this a change from baseline?: Pre-admission baseline In/Out Bed: Needs assistance Is this a change from baseline?: Pre-admission baseline Walks in Home: Needs assistance Is this a change from baseline?: Pre-admission baseline Does the patient have difficulty walking or climbing stairs?: Yes Weakness of Legs:  Both Weakness of Arms/Hands: Both  Permission Sought/Granted   Permission granted to share information with : Yes, Verbal Permission Granted  Share Information with NAME: Jackelyn Poling  Permission granted to share info w AGENCY: VA, HH, SNF/STR, DME agencies related to discharge plan.  Permission granted to share info w Relationship: Spouse  Permission granted to share info w Contact Information: [phone]  Emotional Assessment Appearance:: Appears stated age   Affect (typically observed): Restless Orientation: : Oriented to Self, Fluctuating Orientation (Suspected and/or reported Sundowners) Alcohol / Substance Use: Not Applicable Psych Involvement: No (comment)  Admission diagnosis:  Lower urinary tract infectious disease [N39.0] Confusion [R41.0] UTI (urinary tract infection) [N39.0] Patient Active Problem List   Diagnosis Date Noted   Acute metabolic encephalopathy 09/81/1914   History of urinary retention 01/03/2021   Malnutrition of moderate degree 12/11/2020   Status post hip hemiarthroplasty 12/11/2020   UTI (urinary tract infection)    Benign prostatic hyperplasia with urinary frequency    Generalized weakness    At high risk for falls 06/28/2020   Senile purpura ([place]) 06/22/2020   Aortic atherosclerosis ([place]) 06/05/2020   Scoliosis of thoracolumbar spine 06/03/2020   History of 2019 novel coronavirus disease (COVID-19) 03/13/2020   Stage 3b chronic kidney disease ([place]) 11/26/2019   Difficulty walking 08/04/2017   Expressive aphasia 08/04/2017   Parkinson disease ([place]) 08/04/2017   Advanced care planning/counseling discussion 07/21/2016   GERD (gastroesophageal reflux disease) 12/30/2015   Neuropathy 07/17/2015   Dementia due to Parkinson's disease with behavioral disturbance ([place]) 07/17/2015   Orthostatic hypotension 07/17/2015   Hypertension 07/17/2015   Coronary artery disease involving coronary bypass graft of native heart without angina pectoris 07/17/2015    Constipation due to slow transit 03/20/2013   PCP:  Venita Lick, NP Pharmacy:   [id number] - [place], [place] - [address] [place] [address] Phone: [phone] Fax: [phone]  [id number] [place], [place] [address] [place] [address] Phone: [phone] Fax: [phone]     Social Determinants of Health (SDOH) Interventions    Readmission Risk Interventions No flowsheet data found.

## 2021-01-05 NOTE — TOC Progression Note (Signed)
Transition of Care (TOC) - Progression Note    Patient Details  Name: Jason Cross. MRN: [id number] Date of Birth: 09-19-1928  Transition of Care [facility]) CM/SW Contact  Izola Price, RN Phone Number: 01/05/2021, 12:53 PM  Clinical Narrative: 11/13: Patient was admitted 11/11 from [facility] with likely UTI pending cultures.  Foley in place at this time.  Patient has PMH htx of Parkinson's, dementia, neuropathy.  Patient was able to get around with some help prior to admission to [facility] for hips surgery.  Was transferred to [facility] for STR and was there 17 days.  Spouse found patient "hanging out of bed, soiled in urine and bowel movement".  She also stated they were not doing therapy, stating his BP was low and canceling therapies so her observation is that he did not receive enough therapy to show improvement before showing signs of UTI.  Spouse is concerned about safe return to current facility, unsure if she could do [place] and would need all services if so; or another facility or [place] facility (60% connected via [place] area).  Spouse has been unable to reach a SW at the [place].  CM emailed [place] contact, Steva Ready, to see if some assistance/guidance for spouse or CM for placement available. Discussed options with spouse between [facility], Hospice, STR, [place]. TOC to follow up Monday CM.  Spouse can drive patient or drive to visit as far as [place] for [place] placement if necessary.  No issues getting medications,  PCP DR. [name] Cannady. [id number] [place], [place] - [address] Has used [facility] in the past.  Hospice note in chart re: seeing patient on discharge? Spouse was unsure about this.  Simmie Davies RN CM          Expected Discharge Plan and Services                                                 Social Determinants of Health (SDOH) Interventions    Readmission Risk Interventions No flowsheet data found.

## 2021-01-06 DIAGNOSIS — Z96649 Presence of unspecified artificial hip joint: Secondary | ICD-10-CM

## 2021-01-06 DIAGNOSIS — Z515 Encounter for palliative care: Secondary | ICD-10-CM

## 2021-01-06 DIAGNOSIS — R410 Disorientation, unspecified: Secondary | ICD-10-CM

## 2021-01-06 DIAGNOSIS — N1832 Chronic kidney disease, stage 3b: Secondary | ICD-10-CM

## 2021-01-06 DIAGNOSIS — Z7189 Other specified counseling: Secondary | ICD-10-CM

## 2021-01-06 DIAGNOSIS — Z87898 Personal history of other specified conditions: Secondary | ICD-10-CM

## 2021-01-06 DIAGNOSIS — R531 Weakness: Secondary | ICD-10-CM

## 2021-01-06 DIAGNOSIS — I25810 Atherosclerosis of coronary artery bypass graft(s) without angina pectoris: Secondary | ICD-10-CM

## 2021-01-06 LAB — BASIC METABOLIC PANEL
Anion gap: 9 (ref 5–15)
BUN: 34 mg/dL — ABNORMAL HIGH (ref 8–23)
CO2: 20 mmol/L — ABNORMAL LOW (ref 22–32)
Calcium: 8.6 mg/dL — ABNORMAL LOW (ref 8.9–10.3)
Chloride: 112 mmol/L — ABNORMAL HIGH (ref 98–111)
Creatinine, Ser: 1.21 mg/dL (ref 0.61–1.24)
GFR, Estimated: 57 mL/min — ABNORMAL LOW (ref >60)
Glucose, Bld: 106 mg/dL — ABNORMAL HIGH (ref 70–99)
Potassium: 3.5 mmol/L (ref 3.5–5.1)
Sodium: 141 mmol/L (ref 135–145)

## 2021-01-06 LAB — URINE CULTURE: Culture: >=100000 — AB

## 2021-01-06 MED ORDER — NITROFURANTOIN MONOHYD MACRO 100 MG PO CAPS
100.0000 mg | ORAL_CAPSULE | Freq: Two times a day (BID) | ORAL | Status: DC
  Administered 2021-01-06 – 2021-01-08 (×4): 100 mg via ORAL
  Filled 2021-01-06 (×5): qty 1

## 2021-01-06 MED ORDER — CEFDINIR 300 MG PO CAPS: 300.0000 mg | ORAL_CAPSULE | Freq: Two times a day (BID) | ORAL | Status: DC

## 2021-01-06 MED ORDER — CEFDINIR 300 MG PO CAPS
300.0000 mg | ORAL_CAPSULE | Freq: Two times a day (BID) | ORAL | Status: DC
  Filled 2021-01-06: qty 1

## 2021-01-06 NOTE — Telephone Encounter (Addendum)
Jasmine on behalf of [name] states all steps have been complete as suggested and pt is being discharged 11/16 with request for hospice, not going back to [facility] and request [name] to be attending. Let  [name] or Elizebeth Koller know [phone]

## 2021-01-06 NOTE — Progress Notes (Signed)
Physical Therapy Treatment Patient Details Name: Jason Cross. MRN: [id number] DOB: [birth year] Today's Date: 01/06/2021   History of Present Illness 87 y.o. male admitted on 11/11 from SNF was found to have sepsis UTI, fever, AMS, and was recent admit for L hip hemiarthroplasty after falling at home.  Home with wife who will require independent gait to go home from him.  PMHx: dementia, Parkinson's disease, CAD status post CABG, peripheral neuropathy, GERD    PT Comments    Pt awakens easily.  Participated in exercises as described below.  To EOB with min a x 1.  Steady in sitting.  He is able to stand with min a x 2 and march in place with decent quality.  He is able to progress gait 50' x 1 with RW and min a x 2 for balance and general safety but overall does much better than anticiapted with gait.  He is engaged in session and answers questions appropriately.  Remained in recliner after session with nursing student assisting with breakfast.   Recommendations for follow up therapy are one component of a multi-disciplinary discharge planning process, led by the attending physician.  Recommendations may be updated based on patient status, additional functional criteria and insurance authorization.  Follow Up Recommendations  Skilled nursing-short term rehab (<3 hours/day)     Assistance Recommended at Discharge Frequent or constant Supervision/Assistance  Equipment Recommendations  Rolling walker (2 wheels)    Recommendations for Other Services       Precautions / Restrictions Precautions Precautions: Fall Precaution Booklet Issued: Yes (comment) Precaution Comments: instructed previous encounter Required Braces or Orthoses: Other Brace Other Brace: hand mitts Restrictions Weight Bearing Restrictions: No Other Position/Activity Restrictions: posterior precautions     Mobility  Bed Mobility Overal bed mobility: Needs Assistance       Supine to sit: Min assist           Transfers Overall transfer level: Needs assistance Equipment used: Rolling walker (2 wheels) Transfers: Sit to/from Stand Sit to Stand: Min assist;+2 physical assistance                Ambulation/Gait Ambulation/Gait assistance: Min assist;+2 physical assistance Gait Distance (Feet): 50 Feet Assistive device: Rolling walker (2 wheels) Gait Pattern/deviations: Step-through pattern;Decreased step length - right;Decreased step length - left;Narrow base of support Gait velocity: decreased     General Gait Details: Does quite well with gait   Stairs             Wheelchair Mobility    Modified Rankin (Stroke Patients Only)       Balance Overall balance assessment: Needs assistance;History of Falls   Sitting balance-Leahy Scale: Fair     Standing balance support: Bilateral upper extremity supported;During functional activity Standing balance-Leahy Scale: Poor                              Cognition Arousal/Alertness: Awake/alert Behavior During Therapy: WFL for tasks assessed/performed Overall Cognitive Status: History of cognitive impairments - at baseline                                          Exercises Total Joint Exercises Ankle Circles/Pumps: AROM;Both;20 reps Heel Slides: AAROM;Left;20 reps Hip ABduction/ADduction: AAROM;Left;20 reps Straight Leg Raises: AAROM;Left;5 reps    General Comments        Pertinent  Vitals/Pain Pain Assessment: No/denies pain    Home Living                          Prior Function            PT Goals (current goals can now be found in the care plan section) Progress towards PT goals: Progressing toward goals    Frequency    Min 2X/week      PT Plan Current plan remains appropriate    Co-evaluation              AM-PAC PT "6 Clicks" Mobility   Outcome Measure  Help needed turning from your back to your side while in a flat bed without using  bedrails?: A Lot Help needed moving from lying on your back to sitting on the side of a flat bed without using bedrails?: A Lot Help needed moving to and from a bed to a chair (including a wheelchair)?: A Lot Help needed standing up from a chair using your arms (e.g., wheelchair or bedside chair)?: A Lot Help needed to walk in hospital room?: A Lot Help needed climbing 3-5 steps with a railing? : Total 6 Click Score: 11    End of Session Equipment Utilized During Treatment: Gait belt Activity Tolerance: Patient tolerated treatment well Patient left: in chair;with call bell/phone within reach;with chair alarm set;with nursing/sitter in room Nurse Communication: Mobility status;Precautions PT Visit Diagnosis: Unsteadiness on feet (R26.81);Repeated falls (R29.6);Muscle weakness (generalized) (M62.81);Difficulty in walking, not elsewhere classified (R26.2);Pain;Dizziness and giddiness (R42) Pain - Right/Left: Left Pain - part of body: Hip     Time: 4562-5638 PT Time Calculation (min) (ACUTE ONLY): 17 min  Charges:  $Gait Training: 8-22 mins                   Chesley Noon, PTA 01/06/21, 10:27 AM

## 2021-01-06 NOTE — Progress Notes (Addendum)
[facility]) Hospital Liaison Note  Wife Jackelyn Poling would like to proceed with hospice services at home. Chart and patient information reviewed by [facility] physician. Hospice eligibility confirmed.  DME needs discussed. Patient has the following equipment in the home: walker and BSC. Patient/family requests the following equipment for delivery: hospital bed with 1/2 rails, OBT and w/c. Address has been verified and is correct in the chart. Wife Jackelyn Poling is the family contact to arrange time of equipment delivery. Wife states she is unable to have home prepared for DME delivery until Wednesday, 11.16.22. Requested DME be delivered Wednesday AM. Per discussion, plan is to discharge patient home via EMS on 11.16.22 after DME is delivered.   Please send signed and completed DNR home with patient/family. Please provide prescriptions at discharge as needed to ensure ongoing symptom management.   [facility] information and contact numbers given to family. Above information shared with [facility].   Please do not hesitate to call with any hospice related questions or concerns.   Thank you for the opportunity to participate in this patient's care.   Bobbie "Loren Racer, RN, BSN [facility] Liaison [phone]

## 2021-01-06 NOTE — Progress Notes (Addendum)
PROGRESS NOTE    Jason Cross.  PPJ:[id number] DOB: 1928/10/22 DOA: 01/03/2021 PCP: Venita Lick, NP  Outpatient Specialists: neurology, cardiology    Brief Narrative:   From admission h and p Jason Cross. is a 87 y.o. male with medical history significant for Dementia, Parkinson's disease on no specific meds, CKD 3B, HTN, BPH, CAD s/p CABG, s/p left hip hemiarthroplasty on 10/18 and a hospitalization complicated by urinary retention, discharged with Foley catheter to rehab who presents to the ED for evaluation of altered mental status and generalized weakness.  Patient has baseline confusion with sundowning related to his dementia however for the past week he has been getting increasingly restless and agitated needing personal assistance for ambulation and transfers.  He also had little oral intake. Wife feels like he has not been getting the needed therapy at the facility, probably related to him complaining of dizziness when they stood him up for therapy. He has had no vomiting or diarrhea and no complaints of abdominal pain. He Has had a non productive cough without shortness of breath and no fever or chills.     Assessment & Plan:   Principal Problem:   UTI (urinary tract infection) Active Problems:   Dementia due to Parkinson's disease with behavioral disturbance (HCC)   Hypertension   Coronary artery disease involving coronary bypass graft of native heart without angina pectoris   Stage 3b chronic kidney disease (HCC)   Generalized weakness   Status post hip hemiarthroplasty   Acute metabolic encephalopathy   History of urinary retention  # Acute cystitis # Sepsis Sepsis by fever, encephalopathy. Chronic urinary retention likely contributing. Covid/flu neg, no respiratory of GI symptoms. Sepsis physiology resolved. Cultures growing e coli and e faecalis - f/u cultures, urine growing GNRs - stop ceftriaxone (11/11-11/13), start macrobid, plan for 5-7 day  course (through 11/15-17)  # Urinary retention Hx of, likely cause of infection. Discharged last month with foley which was removed at snf. Followed outpt by urology. Has required I/o cath here x2. Retained overnight, Foley placed - maintain Foley; outpt urology f/u - conf flomax  # Acute metabolic encephalopathy Underlying advanced dementia. 2/2 delirium, infection. Resolved - treat underlying causes  # Denture-associated ulcer - benzocaine gel ordered  # End-of-life care DNR, wife wants treat-the-treatable approach for now, is interested in hospice services, SNF - palliative consult, hospice liaison consult - [facility] consult  # CAD  S/p cabg. Appears stable - home aspirin  # Recent left hip arthroplasty On 10/18. Staples have been removed - pt consulted - has outpt ortho f/u scheduled in December  # HFrEF Ef 30-35% in 2021. Appears compensated/euvolemic. Followed by cardiology as outpt. Not on guideline-based therapy possibly 2/2 advanced comorbidities - monitor  # Dementia - home aricept - slp swallow eval; dysphagia 3 diet for now  # Neuropathy - decreased home gabapentin from 300/600 to 300 qhs  # CKD 3a At baseline - monitor    DVT prophylaxis: lovenox Code Status: dnr Family Communication: wife updated @ bedside 11/14  Level of care: Telemetry Medical Status is: Inpatient  Remains inpatient appropriate because: severity of illness        Consultants:  palliative  Procedures: none  Antimicrobials:  Ceftriaxone 11/11>    Subjective: Comfortable sitting up in chair eating breakfast  Objective: Vitals:   01/05/21 1632 01/05/21 2001 01/06/21 0610 01/06/21 0822  BP: (!) 158/89 (!) 140/91 (!) 158/88 (!) 149/91  Pulse: 92 88 83 83  Resp: 17 16 16 20   Temp: 98.3 F (36.8 C) 97.8 F (36.6 C) 98.2 F (36.8 C) 97.7 F (36.5 C)  TempSrc: Oral   Oral  SpO2: 98% 97% 98% 100%  Weight:      Height:        Intake/Output Summary (Last 24  hours) at 01/06/2021 0954 Last data filed at 01/06/2021 0500 Gross per 24 hour  Intake 220 ml  Output 1200 ml  Net -980 ml   Filed Weights   01/03/21 1235 01/04/21 0211  Weight: 78 kg 71.2 kg    Examination:  General exam: Appears calm and comfortable  ENT: 2 shallow ulcers upper anterior gum Respiratory system: Clear to auscultation. Respiratory effort normal. Cardiovascular system: S1 & S2 heard, RRR. No JVD, murmurs, rubs, gallops or clicks. No pedal edema. Gastrointestinal system: Abdomen is nondistended, soft and nontender. No organomegaly or masses felt. Normal bowel sounds heard. Central nervous system: Alert and oriented. No focal neurological deficits. Extremities: Symmetric 5 x 5 power. Skin: No rashes, lesions or ulcers. Healing surgical site left lateral hip Psychiatry: confused    Data Reviewed: I have personally reviewed following labs and imaging studies  CBC: Recent Labs  Lab 01/03/21 1237 01/04/21 0453  WBC 12.0* 10.0  HGB 11.3* 9.3*  HCT 35.4* 29.4*  MCV 88.9 88.0  PLT 236 563   Basic Metabolic Panel: Recent Labs  Lab 01/03/21 1237 01/04/21 0453 01/05/21 0433 01/06/21 0410  NA 138 142 142 141  K 4.3 4.1 3.7 3.5  CL 108 117* 115* 112*  CO2 22 18* 18* 20*  GLUCOSE 108* 107* 96 106*  BUN 40* 40* 36* 34*  CREATININE 1.52* 1.34* 1.27* 1.21  CALCIUM 9.0 7.8* 8.3* 8.6*   GFR: Estimated Creatinine Clearance: 40.6 mL/min (by C-G formula based on SCr of 1.21 mg/dL). Liver Function Tests: Recent Labs  Lab 01/03/21 1237  AST 17  ALT 16  ALKPHOS 187*  BILITOT 1.4*  PROT 6.4*  ALBUMIN 3.0*   No results for input(s): LIPASE, AMYLASE in the last 168 hours. No results for input(s): AMMONIA in the last 168 hours. Coagulation Profile: No results for input(s): INR, PROTIME in the last 168 hours. Cardiac Enzymes: No results for input(s): CKTOTAL, CKMB, CKMBINDEX, TROPONINI in the last 168 hours. BNP (last 3 results) No results for input(s):  PROBNP in the last 8760 hours. HbA1C: No results for input(s): HGBA1C in the last 72 hours. CBG: No results for input(s): GLUCAP in the last 168 hours. Lipid Profile: No results for input(s): CHOL, HDL, LDLCALC, TRIG, CHOLHDL, LDLDIRECT in the last 72 hours. Thyroid Function Tests: No results for input(s): TSH, T4TOTAL, FREET4, T3FREE, THYROIDAB in the last 72 hours. Anemia Panel: No results for input(s): VITAMINB12, FOLATE, FERRITIN, TIBC, IRON, RETICCTPCT in the last 72 hours. Urine analysis:    Component Value Date/Time   COLORURINE YELLOW (A) 01/03/2021 1534   APPEARANCEUR CLOUDY (A) 01/03/2021 1534   APPEARANCEUR Clear 06/28/2020 1532   LABSPEC 1.014 01/03/2021 1534   PHURINE 5.0 01/03/2021 1534   GLUCOSEU NEGATIVE 01/03/2021 1534   HGBUR MODERATE (A) 01/03/2021 1534   BILIRUBINUR NEGATIVE 01/03/2021 1534   BILIRUBINUR Negative 06/28/2020 1532   KETONESUR NEGATIVE 01/03/2021 1534   PROTEINUR 30 (A) 01/03/2021 1534   UROBILINOGEN 0.2 03/26/2013 1331   NITRITE NEGATIVE 01/03/2021 1534   LEUKOCYTESUR LARGE (A) 01/03/2021 1534   Sepsis Labs: @LABRCNTIP (procalcitonin:4,lacticidven:4)  ) Recent Results (from the past 240 hour(s))  Resp Panel by RT-PCR (Flu A&B, Covid) Nasopharyngeal  Swab     Status: None   Collection Time: 01/03/21  3:34 PM   Specimen: Nasopharyngeal Swab; Nasopharyngeal(NP) swabs in vial transport medium  Result Value Ref Range Status   SARS Coronavirus 2 by RT PCR NEGATIVE NEGATIVE Final    Comment: (NOTE) SARS-CoV-2 target nucleic acids are NOT DETECTED.  The SARS-CoV-2 RNA is generally detectable in upper respiratory specimens during the acute phase of infection. The lowest concentration of SARS-CoV-2 viral copies this assay can detect is 138 copies/mL. A negative result does not preclude SARS-Cov-2 infection and should not be used as the sole basis for treatment or other patient management decisions. A negative result may occur with  improper  specimen collection/handling, submission of specimen other than nasopharyngeal swab, presence of viral mutation(s) within the areas targeted by this assay, and inadequate number of viral copies(<138 copies/mL). A negative result must be combined with clinical observations, patient history, and epidemiological information. The expected result is Negative.  Fact Sheet for Patients:  EntrepreneurPulse.com.au  Fact Sheet for Healthcare Providers:  IncredibleEmployment.be  This test is no t yet approved or cleared by the [place] FDA and  has been authorized for detection and/or diagnosis of SARS-CoV-2 by FDA under an Emergency Use Authorization (EUA). This EUA will remain  in effect (meaning this test can be used) for the duration of the COVID-19 declaration under Section 564(b)(1) of the Act, 21 U.S.C.section 360bbb-3(b)(1), unless the authorization is terminated  or revoked sooner.       Influenza A by PCR NEGATIVE NEGATIVE Final   Influenza B by PCR NEGATIVE NEGATIVE Final    Comment: (NOTE) The Xpert Xpress SARS-CoV-2/FLU/RSV plus assay is intended as an aid in the diagnosis of influenza from Nasopharyngeal swab specimens and should not be used as a sole basis for treatment. Nasal washings and aspirates are unacceptable for Xpert Xpress SARS-CoV-2/FLU/RSV testing.  Fact Sheet for Patients: EntrepreneurPulse.com.au  Fact Sheet for Healthcare Providers: IncredibleEmployment.be  This test is not yet approved or cleared by the [place] FDA and has been authorized for detection and/or diagnosis of SARS-CoV-2 by FDA under an Emergency Use Authorization (EUA). This EUA will remain in effect (meaning this test can be used) for the duration of the COVID-19 declaration under Section 564(b)(1) of the Act, 21 U.S.C. section 360bbb-3(b)(1), unless the authorization is terminated or revoked.  Performed at  [facility], [address], [place], [place] [address]   Urine Culture     Status: Abnormal   Collection Time: 01/03/21  7:42 PM   Specimen: Urine, Random  Result Value Ref Range Status   Specimen Description   Final    URINE, RANDOM Performed at [facility], [address] [place], [place] [address]    Special Requests   Final    NONE Performed at [facility], [address] [place], [place] [address]    Culture (A)  Final    >=100,000 COLONIES/mL ESCHERICHIA COLI >=100,000 COLONIES/mL ENTEROCOCCUS FAECALIS    Report Status 01/06/2021 FINAL  Final   Organism ID, Bacteria ESCHERICHIA COLI (A)  Final   Organism ID, Bacteria ENTEROCOCCUS FAECALIS (A)  Final      Susceptibility   Escherichia coli - MIC*    AMPICILLIN >=32 RESISTANT Resistant     CEFAZOLIN 16 SENSITIVE Sensitive     CEFEPIME <=0.12 SENSITIVE Sensitive     CEFTRIAXONE <=0.25 SENSITIVE Sensitive     CIPROFLOXACIN <=0.25 SENSITIVE Sensitive     GENTAMICIN <=1 SENSITIVE Sensitive  IMIPENEM <=0.25 SENSITIVE Sensitive     NITROFURANTOIN <=16 SENSITIVE Sensitive     TRIMETH/SULFA <=20 SENSITIVE Sensitive     AMPICILLIN/SULBACTAM >=32 RESISTANT Resistant     PIP/TAZO <=4 SENSITIVE Sensitive     * >=100,000 COLONIES/mL ESCHERICHIA COLI   Enterococcus faecalis - MIC*    AMPICILLIN <=2 SENSITIVE Sensitive     NITROFURANTOIN <=16 SENSITIVE Sensitive     VANCOMYCIN 1 SENSITIVE Sensitive     * >=100,000 COLONIES/mL ENTEROCOCCUS FAECALIS  CULTURE, BLOOD (ROUTINE X 2) w Reflex to ID Panel     Status: None (Preliminary result)   Collection Time: 01/04/21 11:19 AM   Specimen: BLOOD  Result Value Ref Range Status   Specimen Description BLOOD RIGHT HAND  Final   Special Requests   Final    BOTTLES DRAWN AEROBIC AND ANAEROBIC Blood Culture adequate volume   Culture   Final    NO GROWTH < 24 HOURS Performed at [facility], [address] [place], [place] [address]    Report  Status PENDING  Incomplete  CULTURE, BLOOD (ROUTINE X 2) w Reflex to ID Panel     Status: None (Preliminary result)   Collection Time: 01/04/21 11:19 AM   Specimen: BLOOD  Result Value Ref Range Status   Specimen Description BLOOD  LEFT HAND  Final   Special Requests   Final    BOTTLES DRAWN AEROBIC AND ANAEROBIC Blood Culture results may not be optimal due to an excessive volume of blood received in culture bottles   Culture   Final    NO GROWTH < 24 HOURS Performed at [facility], [address] [place], [place] [address]    Report Status PENDING  Incomplete  MRSA Next Gen by PCR, Nasal     Status: None   Collection Time: 01/04/21  7:27 PM   Specimen: Nasal Mucosa; Nasal Swab  Result Value Ref Range Status   MRSA by PCR Next Gen NOT DETECTED NOT DETECTED Final    Comment: (NOTE) The GeneXpert MRSA Assay (FDA approved for NASAL specimens only), is one component of a comprehensive MRSA colonization surveillance program. It is not intended to diagnose MRSA infection nor to guide or monitor treatment for MRSA infections. Test performance is not FDA approved in patients less than 83 years old. Performed at [facility], [address] [place],  [address]          Radiology Studies: DG Chest [place] 1 View  Result Date: 01/04/2021 CLINICAL DATA:  Fever EXAM: PORTABLE CHEST 1 VIEW COMPARISON:  December 10, 2020 FINDINGS: The cardiomediastinal silhouette is unchanged in contour.Status post median sternotomy and CABG. Atherosclerotic calcifications of the aorta. No pleural effusion. No pneumothorax. No acute pleuroparenchymal abnormality. Visualized abdomen is unremarkable. Multilevel degenerative changes of the thoracic spine. IMPRESSION: No acute cardiopulmonary abnormality. Electronically Signed   By: Valentino Saxon M.D.   On: 01/04/2021 11:50        Scheduled Meds:  aspirin EC  81 mg Oral q morning   Chlorhexidine Gluconate Cloth  6 each Topical  Daily   donepezil  10 mg Oral QHS   enoxaparin (LOVENOX) injection  40 mg Subcutaneous Q24H   gabapentin  300 mg Oral QHS   melatonin  5 mg Oral QHS   tamsulosin  0.4 mg Oral QPM   Continuous Infusions:  cefTRIAXone (ROCEPHIN)  IV Stopped (01/05/21 2257)     LOS: 3 days    Time spent: 25 min    Olen Cordial  Manfred Arch, MD [facility]   If 7PM-7AM, please contact night-coverage [email or web address] Password [facility] 01/06/2021, 9:54 AM

## 2021-01-06 NOTE — Telephone Encounter (Signed)
[place] notified and verbalized understanding.

## 2021-01-06 NOTE — Consult Note (Signed)
Palliative Care Consult Note                                  Date: 01/06/2021   Patient Name: Jason Cross.  DOB: [birth year]  MRN: [id number]  Age / Sex: 87 y.o., male  PCP: Venita Lick, NP Referring Physician: Gwynne Edinger, MD  Reason for Consultation: Establishing goals of care  HPI/Patient Profile: 87 y.o. male  with past medical history of  Dementia, Parkinson's disease on no specific meds, CKD 3B, HTN, BPH, CAD s/p CABG, s/p left hip hemiarthroplasty on 10/18 and a hospitalization complicated by urinary retention, discharged with Foley catheter to rehab who presents to the ED for evaluation of altered mental status and generalized weakness.  Patient has baseline confusion with sundowning related to his dementia however for the past week he has been getting increasingly restless and agitated needing personal assistance for ambulation and transfers.  He also had little oral intake.  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  PMT was consulted for goals of care discussions.   Past Medical History:  Diagnosis Date  . Arthritis   . Benign prostate hyperplasia   . Cataracts, bilateral   . Coarse tremors    L>R hands  . COPD (chronic obstructive pulmonary disease) ([place])    first stages  . Coronary artery disease   . Dizziness    occasional  . Gastric ulcer    in the 60's  . GERD (gastroesophageal reflux disease)    takes Omeprazole every other day  . Hard of hearing    doesn't wear hearing aids  . History of colon polyps   . History of kidney stones    still one on the left side, low  . Hyperlipidemia    takes Simvastatin daily  . Hypertension    stopped BP meds, BP under control  . Macular degeneration   . Myocardial infarction ([place]) 2009  .  Peripheral neuropathy    takes Gabapentin daily  . Pneumonia    hx of in the 60's  . Sleep apnea     Subjective:   This NP Walden Field reviewed medical records, received report from team, assessed the patient and then meet at the patient's bedside to discuss diagnosis, prognosis, GOC, EOL wishes disposition and options.  I met with the patient and his wife at the bedside.  We were later joined by the patient's wife's sister.   Concept of Palliative Care was introduced as specialized medical care for people and their families living with serious illness.  If focuses on providing relief from the symptoms and stress of a serious illness.  The goal is to improve quality of life for both the patient and the family. Values and goals of care important to patient and family were attempted to be elicited.  Created space and opportunity for patient  and family to explore thoughts and feelings regarding current medical situation   Natural trajectory and current clinical status were discussed. Questions and concerns addressed. Patient  encouraged to call with questions or concerns.    Patient/Family Understanding of Illness: They understand he has been confused recently.  This all started with a fall for which she was admitted for hip fracture and went to rehab.  His wife does not think  he got the care that he needed.  She specifically stated that she asked them if he had a UTI and he kept insisting he did not would not test him.  He ended up admitted with a UTI.  She notes that ever since his fall he has gone downhill.  We discussed how acute care complications can often exacerbate existing comorbidities such as dementia.  She notes and agrees that his dementia has advanced quite a bit.  His wife notes that at baseline the patient was getting PT with home health care.  He is able to take care of his daily cares, ambulates to the bathroom and other areas.  Today's Discussion: Today we discussed the  patient's current acute and chronic health conditions, as described above.  Additionally, he is not eating very much.  She states he used to eat what she brought him including milkshakes and peanut butter sandwiches.  However, now he has less interest and is eating less than 25%.  We discussed that as dementia progresses and patients become end-of-life that they do not feel "hunger" and it is better to not "force feed them".    Her overall goal is for him to get back to baseline, although she now understands that he likely will not.  He was a Cytogeneticist and a message has been sent to the [place] to see if he qualifies for long-term care with the [place].  (Later in the day it was determined he is only 60% and would need to be 70% to qualify).  She states that "in my heart I want him better, but the doctor says he will not get better.    We discussed different options including long-term care with hospice, home with hospice.  She understands that he would not improve with physical therapy/rehab.  She has decided to elect hospice.  Her main concern is whether or not she would be able to care for him at home.  However, she states it is important to her to be able to take him home and not "put him in a facility".  It is for this reason she is declining residential hospice at this time, unless it becomes necessary.  She ended up deciding to take the patient home.  I answered multiple questions about hospice services, hospice philosophy, and availability of services and DME.  She states that she would need a hospital bed.  However, she would not be able to have the home set up to except a hospital bed for about 24 to 48 hours.  She also asked about his urinary retention and whether he could go home with a permanent catheter and I informed her that I would notify the hospitalist.  I provided emotional and general support through therapeutic listening, empathy, and sharing of stories as well as other techniques.  I answered all  questions, addressed all concerns.  Review of Systems  Unable to perform ROS: Mental status change   Objective:   Primary Diagnoses: Present on Admission: . Hypertension . Coronary artery disease involving coronary bypass graft of native heart without angina pectoris   Physical Exam Vitals and nursing note reviewed.  Constitutional:      General: He is awake. He is not in acute distress.    Appearance: He is ill-appearing.     Comments: Ot answering questions, does make eye contact  HENT:     Head: Normocephalic and atraumatic.  Cardiovascular:     Rate and Rhythm: Normal rate.  Pulmonary:  Effort: Pulmonary effort is normal. No respiratory distress.     Breath sounds: No wheezing or rhonchi.  Abdominal:     General: Abdomen is flat.     Palpations: Abdomen is soft.  Skin:    General: Skin is warm and dry.  Neurological:     Mental Status: He is disoriented and confused.    Vital Signs:  BP (!) 149/91 (BP Location: Left Arm)   Pulse 83   Temp 97.7 F (36.5 C) (Oral)   Resp 20   Ht $R'5\' 9"'lt$  (1.753 m)   Wt 71.2 kg   SpO2 100%   BMI 23.18 kg/m   Palliative Assessment/Data: 20%    Advanced Care Planning:   Primary Decision Maker: NEXT OF KIN  Code Status/Advance Care Planning: DNR  A discussion was had today regarding advanced directives. Concepts specific to code status, artifical feeding and hydration, continued IV antibiotics and rehospitalization was had.  The difference between a aggressive medical intervention path and a palliative comfort care path for this patient at this time was had.  Decisions/Changes to ACP: None today  Assessment & Plan:   Impression: 87 year old male with acute on chronic conditions as described above.  He appears to be approaching the end of life, primarily driven by acute UTI affecting and exacerbating his pre-existing dementia.  Minimal oral intake, lethargic, minimally interactive.  His wife has elected hospice at  home.  SUMMARY OF RECOMMENDATIONS   Remain DNR TOC consult for home hospice services Continued emotional support of spouse PMT will follow peripherally for any new needs Please do not hesitate to call us if we can be of further assistance  Symptom Management:  Per primary team PMT is available to assist as needed  Prognosis:  < 6 months  Discharge Planning:  Home with Hospice   Discussed with: Medical team, nursing team, [facility] team, Patient's wife  Thank you for allowing [place] to participate in the care of [place]. PMT will continue to support holistically.  Time Total: 105 min  Greater than 50%  of this time was spent counseling and coordinating care related to the above assessment and plan.  Signed by: Walden Field, NP Palliative Medicine Team  Team Phone # [phone] (Nights/Weekends)  01/06/2021, 12:29 PM

## 2021-01-06 NOTE — Progress Notes (Signed)
[facility]) Hospital Liaison Note  Per Dr. Si Raider and Dreama Saa, RN [facility] Manager patient's wife is requesting information related to hospice services.  Spoke with wife Jackelyn Poling. Per Jackelyn Poling, last week she had reached out to PCP to receive additional care for patient. Patient was at [facility] and referral for hospice services was received by [facility] on 11.11.22. Patient was hospitalized prior to admission to hospice services. The wife really wants to take patient home but is unsure if she can handle him at home. Provided information related to services provided by hospice. Wife voiced understanding of information given. [place] phone number provided for further questions. Wife states she would like to wait and speak with Dreama Saa, RN [facility] Manager to determine all of her options prior to making a decision related to hospice services. Notified Dreama Saa, RN [facility] Manager.   Please do not hesitate to call with any hospice related questions or concerns.   Thank you,   Bobbie "Loren Racer, [place], BSN [facility] Liaison [phone]

## 2021-01-06 NOTE — Telephone Encounter (Signed)
Spoke with [name] at [facility] and notified her of [name]'s recommendations for the patient upon discharge. [name] verbalized understanding and has no further questions.

## 2021-01-07 DIAGNOSIS — N3000 Acute cystitis without hematuria: Secondary | ICD-10-CM | POA: Diagnosis not present

## 2021-01-07 NOTE — Care Management Important Message (Signed)
Important Message  Patient Details  Name: Jason Cross. MRN: [id number] Date of Birth: 30-Oct-1928   Medicare Important Message Given:  Other (see comment)  Patient is going to be discharged home with Hospice and out of respect for the patient and family no Important Message from [facility] given.   Juliann Pulse A Jahmiya Guidotti 01/07/2021, 9:56 AM

## 2021-01-07 NOTE — Progress Notes (Signed)
PROGRESS NOTE    Jason Cross.  AQL:[id number] DOB: Jun 09, 1928 DOA: 01/03/2021 PCP: Venita Lick, NP  Outpatient Specialists: neurology, cardiology    Brief Narrative:   From admission h and p Jason Cross. is a 87 y.o. male with medical history significant for Dementia, Parkinson's disease on no specific meds, CKD 3B, HTN, BPH, CAD s/p CABG, s/p left hip hemiarthroplasty on 10/18 and a hospitalization complicated by urinary retention, discharged with Foley catheter to rehab who presents to the ED for evaluation of altered mental status and generalized weakness.  Patient has baseline confusion with sundowning related to his dementia however for the past week he has been getting increasingly restless and agitated needing personal assistance for ambulation and transfers.  He also had little oral intake. Wife feels like he has not been getting the needed therapy at the facility, probably related to him complaining of dizziness when they stood him up for therapy. He has had no vomiting or diarrhea and no complaints of abdominal pain. He Has had a non productive cough without shortness of breath and no fever or chills.     Assessment & Plan:   Principal Problem:   UTI (urinary tract infection) Active Problems:   Dementia due to Parkinson's disease with behavioral disturbance (HCC)   Hypertension   Coronary artery disease involving coronary bypass graft of native heart without angina pectoris   Stage 3b chronic kidney disease (HCC)   Generalized weakness   Status post hip hemiarthroplasty   Acute metabolic encephalopathy   History of urinary retention  # Acute cystitis # Sepsis Sepsis by fever, encephalopathy. Chronic urinary retention likely contributing. Covid/flu neg, no respiratory of GI symptoms. Sepsis physiology resolved. Cultures growing e coli and e faecalis - f/u cultures, urine growing GNRs - stop ceftriaxone (11/11-11/13), start macrobid, plan for 5-7 day  course (through 11/15-17)  # Urinary retention Hx of, likely cause of infection. Discharged last month with foley which was removed at snf. Followed outpt by urology. Has required I/o cath here x2. Retained overnight, Foley placed - maintain Foley; outpt urology f/u - conf flomax  # Acute metabolic encephalopathy Underlying advanced dementia. 2/2 delirium, infection. Resolved - treat underlying causes  # End-of-life care DNR, wife wants treat-the-treatable approach for now, is interested in hospice services - palliative and hospice and TOC consulted - plan now is home w/ home hospice and home health. Plan is discharge tomorrow after DME (hospital bed) is in place  # Denture-associated ulcer - benzocaine gel ordered  # CAD  S/p cabg. Appears stable - home aspirin  # Recent left hip arthroplasty On 10/18. Staples have been removed - has outpt ortho f/u scheduled in December  # HFrEF Ef 30-35% in 2021. Appears compensated/euvolemic. Followed by cardiology as outpt. Not on guideline-based therapy possibly 2/2 advanced comorbidities - monitor  # Dementia - home aricept - slp swallow eval; dysphagia 3 diet for now  # Neuropathy - decreased home gabapentin from 300/600 to 300 qhs  # CKD 3a At baseline - monitor    DVT prophylaxis: lovenox Code Status: dnr Family Communication: wife updated @ bedside 11/14  Level of care: Telemetry Medical Status is: Inpatient  Remains inpatient appropriate because: severity of illness   Consultants:  palliative  Procedures: none  Antimicrobials:  Ceftriaxone 11/11>    Subjective: Comfortable sitting up in bed eating breakfast. Confused.  Objective: Vitals:   01/06/21 0822 01/06/21 1530 01/06/21 2042 01/07/21 0854  BP: (!) 149/91 Marland Kitchen)  147/84 (!) 148/80 115/80  Pulse: 83 81 80 81  Resp: 20 18 18 16   Temp: 97.7 F (36.5 C) 98.4 F (36.9 C) 97.8 F (36.6 C) 98.1 F (36.7 C)  TempSrc: Oral Oral Axillary Oral  SpO2:  100% 95% 96% 98%  Weight:      Height:        Intake/Output Summary (Last 24 hours) at 01/07/2021 0931 Last data filed at 01/07/2021 0556 Gross per 24 hour  Intake --  Output 800 ml  Net -800 ml   Filed Weights   01/03/21 1235 01/04/21 0211  Weight: 78 kg 71.2 kg    Examination:  General exam: Appears calm and comfortable  ENT: 2 shallow ulcers upper anterior gum Respiratory system: Clear to auscultation. Respiratory effort normal. Cardiovascular system: S1 & S2 heard, RRR. No JVD, murmurs, rubs, gallops or clicks. No pedal edema. Gastrointestinal system: Abdomen is nondistended, soft and nontender. No organomegaly or masses felt. Normal bowel sounds heard. Central nervous system: Alert and oriented. No focal neurological deficits. Extremities: Symmetric 5 x 5 power. Skin: No rashes, lesions or ulcers. Healing surgical site left lateral hip Psychiatry: confused    Data Reviewed: I have personally reviewed following labs and imaging studies  CBC: Recent Labs  Lab 01/03/21 1237 01/04/21 0453  WBC 12.0* 10.0  HGB 11.3* 9.3*  HCT 35.4* 29.4*  MCV 88.9 88.0  PLT 236 177   Basic Metabolic Panel: Recent Labs  Lab 01/03/21 1237 01/04/21 0453 01/05/21 0433 01/06/21 0410  NA 138 142 142 141  K 4.3 4.1 3.7 3.5  CL 108 117* 115* 112*  CO2 22 18* 18* 20*  GLUCOSE 108* 107* 96 106*  BUN 40* 40* 36* 34*  CREATININE 1.52* 1.34* 1.27* 1.21  CALCIUM 9.0 7.8* 8.3* 8.6*   GFR: Estimated Creatinine Clearance: 40.6 mL/min (by C-G formula based on SCr of 1.21 mg/dL). Liver Function Tests: Recent Labs  Lab 01/03/21 1237  AST 17  ALT 16  ALKPHOS 187*  BILITOT 1.4*  PROT 6.4*  ALBUMIN 3.0*   No results for input(s): LIPASE, AMYLASE in the last 168 hours. No results for input(s): AMMONIA in the last 168 hours. Coagulation Profile: No results for input(s): INR, PROTIME in the last 168 hours. Cardiac Enzymes: No results for input(s): CKTOTAL, CKMB, CKMBINDEX,  TROPONINI in the last 168 hours. BNP (last 3 results) No results for input(s): PROBNP in the last 8760 hours. HbA1C: No results for input(s): HGBA1C in the last 72 hours. CBG: No results for input(s): GLUCAP in the last 168 hours. Lipid Profile: No results for input(s): CHOL, HDL, LDLCALC, TRIG, CHOLHDL, LDLDIRECT in the last 72 hours. Thyroid Function Tests: No results for input(s): TSH, T4TOTAL, FREET4, T3FREE, THYROIDAB in the last 72 hours. Anemia Panel: No results for input(s): VITAMINB12, FOLATE, FERRITIN, TIBC, IRON, RETICCTPCT in the last 72 hours. Urine analysis:    Component Value Date/Time   COLORURINE YELLOW (A) 01/03/2021 1534   APPEARANCEUR CLOUDY (A) 01/03/2021 1534   APPEARANCEUR Clear 06/28/2020 1532   LABSPEC 1.014 01/03/2021 1534   PHURINE 5.0 01/03/2021 1534   GLUCOSEU NEGATIVE 01/03/2021 1534   HGBUR MODERATE (A) 01/03/2021 1534   BILIRUBINUR NEGATIVE 01/03/2021 1534   BILIRUBINUR Negative 06/28/2020 1532   KETONESUR NEGATIVE 01/03/2021 1534   PROTEINUR 30 (A) 01/03/2021 1534   UROBILINOGEN 0.2 03/26/2013 1331   NITRITE NEGATIVE 01/03/2021 1534   LEUKOCYTESUR LARGE (A) 01/03/2021 1534   Sepsis Labs: @LABRCNTIP (procalcitonin:4,lacticidven:4)  ) Recent Results (from the past 240  hour(s))  Resp Panel by RT-PCR (Flu A&B, Covid) Nasopharyngeal Swab     Status: None   Collection Time: 01/03/21  3:34 PM   Specimen: Nasopharyngeal Swab; Nasopharyngeal(NP) swabs in vial transport medium  Result Value Ref Range Status   SARS Coronavirus 2 by RT PCR NEGATIVE NEGATIVE Final    Comment: (NOTE) SARS-CoV-2 target nucleic acids are NOT DETECTED.  The SARS-CoV-2 RNA is generally detectable in upper respiratory specimens during the acute phase of infection. The lowest concentration of SARS-CoV-2 viral copies this assay can detect is 138 copies/mL. A negative result does not preclude SARS-Cov-2 infection and should not be used as the sole basis for treatment  or other patient management decisions. A negative result may occur with  improper specimen collection/handling, submission of specimen other than nasopharyngeal swab, presence of viral mutation(s) within the areas targeted by this assay, and inadequate number of viral copies(<138 copies/mL). A negative result must be combined with clinical observations, patient history, and epidemiological information. The expected result is Negative.  Fact Sheet for Patients:  EntrepreneurPulse.com.au  Fact Sheet for Healthcare Providers:  IncredibleEmployment.be  This test is no t yet approved or cleared by the [place] FDA and  has been authorized for detection and/or diagnosis of SARS-CoV-2 by FDA under an Emergency Use Authorization (EUA). This EUA will remain  in effect (meaning this test can be used) for the duration of the COVID-19 declaration under Section 564(b)(1) of the Act, 21 U.S.C.section 360bbb-3(b)(1), unless the authorization is terminated  or revoked sooner.       Influenza A by PCR NEGATIVE NEGATIVE Final   Influenza B by PCR NEGATIVE NEGATIVE Final    Comment: (NOTE) The Xpert Xpress SARS-CoV-2/FLU/RSV plus assay is intended as an aid in the diagnosis of influenza from Nasopharyngeal swab specimens and should not be used as a sole basis for treatment. Nasal washings and aspirates are unacceptable for Xpert Xpress SARS-CoV-2/FLU/RSV testing.  Fact Sheet for Patients: EntrepreneurPulse.com.au  Fact Sheet for Healthcare Providers: IncredibleEmployment.be  This test is not yet approved or cleared by the [place] FDA and has been authorized for detection and/or diagnosis of SARS-CoV-2 by FDA under an Emergency Use Authorization (EUA). This EUA will remain in effect (meaning this test can be used) for the duration of the COVID-19 declaration under Section 564(b)(1) of the Act, 21 U.S.C. section  360bbb-3(b)(1), unless the authorization is terminated or revoked.  Performed at [facility], [address], [place], [place] [address]   Urine Culture     Status: Abnormal   Collection Time: 01/03/21  7:42 PM   Specimen: Urine, Random  Result Value Ref Range Status   Specimen Description   Final    URINE, RANDOM Performed at [facility], [address] [place], [place] [address]    Special Requests   Final    NONE Performed at [facility], [address] [place], [place] [address]    Culture (A)  Final    >=100,000 COLONIES/mL ESCHERICHIA COLI >=100,000 COLONIES/mL ENTEROCOCCUS FAECALIS    Report Status 01/06/2021 FINAL  Final   Organism ID, Bacteria ESCHERICHIA COLI (A)  Final   Organism ID, Bacteria ENTEROCOCCUS FAECALIS (A)  Final      Susceptibility   Escherichia coli - MIC*    AMPICILLIN >=32 RESISTANT Resistant     CEFAZOLIN 16 SENSITIVE Sensitive     CEFEPIME <=0.12 SENSITIVE Sensitive     CEFTRIAXONE <=0.25 SENSITIVE Sensitive     CIPROFLOXACIN <=0.25 SENSITIVE  Sensitive     GENTAMICIN <=1 SENSITIVE Sensitive     IMIPENEM <=0.25 SENSITIVE Sensitive     NITROFURANTOIN <=16 SENSITIVE Sensitive     TRIMETH/SULFA <=20 SENSITIVE Sensitive     AMPICILLIN/SULBACTAM >=32 RESISTANT Resistant     PIP/TAZO <=4 SENSITIVE Sensitive     * >=100,000 COLONIES/mL ESCHERICHIA COLI   Enterococcus faecalis - MIC*    AMPICILLIN <=2 SENSITIVE Sensitive     NITROFURANTOIN <=16 SENSITIVE Sensitive     VANCOMYCIN 1 SENSITIVE Sensitive     * >=100,000 COLONIES/mL ENTEROCOCCUS FAECALIS  CULTURE, BLOOD (ROUTINE X 2) w Reflex to ID Panel     Status: None (Preliminary result)   Collection Time: 01/04/21 11:19 AM   Specimen: BLOOD  Result Value Ref Range Status   Specimen Description BLOOD RIGHT HAND  Final   Special Requests   Final    BOTTLES DRAWN AEROBIC AND ANAEROBIC Blood Culture adequate volume   Culture   Final    NO GROWTH 2 DAYS Performed  at [facility], [address], [place], [place] [address]    Report Status PENDING  Incomplete  CULTURE, BLOOD (ROUTINE X 2) w Reflex to ID Panel     Status: None (Preliminary result)   Collection Time: 01/04/21 11:19 AM   Specimen: BLOOD  Result Value Ref Range Status   Specimen Description BLOOD  LEFT HAND  Final   Special Requests   Final    BOTTLES DRAWN AEROBIC AND ANAEROBIC Blood Culture results may not be optimal due to an excessive volume of blood received in culture bottles   Culture   Final    NO GROWTH 2 DAYS Performed at [facility], [address] [place], [place] [address]    Report Status PENDING  Incomplete  MRSA Next Gen by PCR, Nasal     Status: None   Collection Time: 01/04/21  7:27 PM   Specimen: Nasal Mucosa; Nasal Swab  Result Value Ref Range Status   MRSA by PCR Next Gen NOT DETECTED NOT DETECTED Final    Comment: (NOTE) The GeneXpert MRSA Assay (FDA approved for NASAL specimens only), is one component of a comprehensive MRSA colonization surveillance program. It is not intended to diagnose MRSA infection nor to guide or monitor treatment for MRSA infections. Test performance is not FDA approved in patients less than 35 years old. Performed at [facility], [address] [place],  [address]          Radiology Studies: No results found.      Scheduled Meds:  aspirin EC  81 mg Oral q morning   Chlorhexidine Gluconate Cloth  6 each Topical Daily   donepezil  10 mg Oral QHS   enoxaparin (LOVENOX) injection  40 mg Subcutaneous Q24H   gabapentin  300 mg Oral QHS   melatonin  5 mg Oral QHS   nitrofurantoin (macrocrystal-monohydrate)  100 mg Oral Q12H   tamsulosin  0.4 mg Oral QPM   Continuous Infusions:     LOS: 4 days    Time spent: 20 min    Desma Maxim, MD [facility]   If 7PM-7AM, please contact night-coverage [email or web address] Password [facility] 01/07/2021, 9:31 AM

## 2021-01-08 DIAGNOSIS — N3000 Acute cystitis without hematuria: Secondary | ICD-10-CM | POA: Diagnosis not present

## 2021-01-08 DIAGNOSIS — G9341 Metabolic encephalopathy: Secondary | ICD-10-CM | POA: Diagnosis not present

## 2021-01-08 DIAGNOSIS — G20 Parkinson's disease: Secondary | ICD-10-CM

## 2021-01-08 DIAGNOSIS — N1831 Chronic kidney disease, stage 3a: Secondary | ICD-10-CM | POA: Diagnosis not present

## 2021-01-08 DIAGNOSIS — F02818 Dementia in other diseases classified elsewhere, unspecified severity, with other behavioral disturbance: Secondary | ICD-10-CM

## 2021-01-08 DIAGNOSIS — I5022 Chronic systolic (congestive) heart failure: Secondary | ICD-10-CM | POA: Diagnosis present

## 2021-01-08 MED ORDER — NITROFURANTOIN MONOHYD MACRO 100 MG PO CAPS: 100.0000 mg | ORAL_CAPSULE | Freq: Two times a day (BID) | ORAL | 0 refills | Status: AC

## 2021-01-08 MED ORDER — GABAPENTIN 300 MG PO CAPS: 300.0000 mg | ORAL_CAPSULE | Freq: Every day | ORAL | Status: AC

## 2021-01-08 NOTE — TOC Progression Note (Signed)
Transition of Care (TOC) - Progression Note    Patient Details  Name: Karla Barret Esquivel. MRN: [id number] Date of Birth: 1929-02-15  Transition of Care [facility]) CM/SW [place], RN Phone Number: 01/08/2021, 12:54 PM  Clinical Narrative:  Patient to be discharged to home today under hospice care.  Hospital bed at residence, confirmed by hospice.  Wife will be home to receive patient.  EMS called will be here after 2pm     Expected Discharge Plan: [place] Barriers to Discharge: Continued Medical Work up  Expected Discharge Plan and Services Expected Discharge Plan: [place]   Discharge Planning Services: CM Consult   Living arrangements for the past 2 months: [place] ([facility] for 17 days)                                       Social Determinants of Health (SDOH) Interventions    Readmission Risk Interventions No flowsheet data found.

## 2021-01-08 NOTE — Discharge Summary (Signed)
Discharge Summary  Jason W Nickerson Jr. MRN:[id number] DOB: 09/07/1928  PCP: Cannady, [name] T, NP  Admit date: 01/03/2021 Discharge date: 01/08/2021  Time spent: 25 minutes  Recommendations for Outpatient Follow-up:  New medication: Macrodantin 400 mg p.o. twice daily x2 more days Medication change: Neurontin 300 mg 4 times a day changed to 400 mg nightly Patient will follow up with urology given Foley catheter placement  Discharge Diagnoses:  Active Hospital Problems   Diagnosis Date Noted   UTI (urinary tract infection)    Acute metabolic encephalopathy 01/03/2021   History of urinary retention 01/03/2021   Status post hip hemiarthroplasty 12/11/2020   Generalized weakness    Stage 3b chronic kidney disease (HCC) 11/26/2019   Hypertension 07/17/2015   Coronary artery disease involving coronary bypass graft of native heart without angina pectoris 07/17/2015   Dementia due to Parkinson's disease with behavioral disturbance (HCC) 07/17/2015    Resolved Hospital Problems  No resolved problems to display.    Discharge Condition: Patient being discharged home with hospice  Diet recommendation: Dysphagia 3 diet  Vitals:   01/08/21 0543 01/08/21 0753  BP: 128/68 (!) 161/86  Pulse: 67 89  Resp: 16 20  Temp: 97.9 F (36.6 C) 97.8 F (36.6 C)  SpO2: 98% 98%    History of present illness:  87-year-old male past medical history of Parkinson's dementia, stage IIIa chronic kidney disease, BPH, CAD status post left hip hemiarthroplasty 1 month prior with hospitalization at that time complicated by urinary retention and discharged with Foley catheter to skilled nursing facility admitted to the hospital service on 11/12 for confusion and weakness.  On admission, patient found to have E. coli/Enterococcus sepsis secondary to UTI.  Started on antibiotics.  After discussion with palliative care, patient's wife opted to take patient home with home health and home hospice.  Hospital  Course:  Principal Problem: E. coli/Enterococcus sepsis secondary to UTI (urinary tract infection): Patient met criteria for sepsis on admission given heart rate greater than 90 and temperature greater than 100.5 with urinary source.  Urine cultures grew out Enterococcus and E. coli.  Changed over to Macrobid and will be discharged on 2 more days for p.o. course. Active Problems:   Dementia due to Parkinson's disease with behavioral disturbance/acute encephalopathy (HCC): Secondary to urinary tract infection and acute encephalopathy resolved following treatment of infection.  After discussion with palliative care, patient's wife plans to take him home with home hospice.  Bed to be delivered this morning and patient be discharged after.  Also has chronic dysphagia, discharged on dysphagia 3 diet   Hypertension   Coronary artery disease involving coronary bypass graft of native heart without angina pectoris   Stage 3a chronic kidney disease (HCC): Stable.  Creatinine on 11/14 at baseline, 1.21 with GFR 57.    History of urinary retention: Status post Foley catheter placement.  On Flomax.  Urology follow-up.  Chronic systolic heart failure: Ejection fraction at 30-35%.  Does not appear to be volume overloaded.  Procedures: None  Consultations: Palliative care  Discharge Exam: BP (!) 161/86   Pulse 89   Temp 97.8 F (36.6 C) (Oral)   Resp 20   Ht 5' 9" (1.753 m)   Wt 71.2 kg   SpO2 98%   BMI 23.18 kg/m   General: Alert and oriented x2, no acute distress Cardiovascular: Regular rate and rhythm, S1-S2 Respiratory: Clear to auscultation bilaterally  Discharge Instructions You were cared for by a hospitalist during your hospital stay. If you   have any questions about your discharge medications or the care you received while you were in the hospital after you are discharged, you can call the unit and asked to speak with the hospitalist on call if the hospitalist that took care of you is not  available. Once you are discharged, your primary care physician will handle any further medical issues. Please note that NO REFILLS for any discharge medications will be authorized once you are discharged, as it is imperative that you return to your primary care physician (or establish a relationship with a primary care physician if you do not have one) for your aftercare needs so that they can reassess your need for medications and monitor your lab values.  Discharge Instructions     DIET DYS 3   Complete by: As directed    Fluid consistency: Thin      Allergies as of 01/08/2021       Reactions   Contrast Media [iodinated Diagnostic Agents] Hives, Itching, Rash   Codeine Nausea And Vomiting        Medication List     TAKE these medications    acetaminophen 500 MG tablet Commonly known as: TYLENOL Take 1-2 tablets (500-1,000 mg total) by mouth every 6 (six) hours as needed.   aspirin EC 81 MG tablet Take 81 mg by mouth every morning.   docusate sodium 100 MG capsule Commonly known as: COLACE Take 1 capsule (100 mg total) by mouth 2 (two) times daily.   donepezil 10 MG tablet Commonly known as: ARICEPT Take 1 tablet (10 mg total) by mouth at bedtime.   feeding supplement Liqd Take 237 mLs by mouth 2 (two) times daily between meals.   gabapentin 300 MG capsule Commonly known as: NEURONTIN Take 1 capsule (300 mg total) by mouth at bedtime. What changed: when to take this   HYDROcodone-acetaminophen 5-325 MG tablet Commonly known as: NORCO/VICODIN Take 1 tablet by mouth every 8 (eight) hours as needed for severe pain.   magnesium hydroxide 400 MG/5ML suspension Commonly known as: MILK OF MAGNESIA Take 30 mLs by mouth daily as needed for mild constipation.   melatonin 1 MG Tabs tablet Take 1 tablet (1 mg total) by mouth at bedtime as needed.   methocarbamol 500 MG tablet Commonly known as: ROBAXIN Take 1 tablet (500 mg total) by mouth every 6 (six) hours as  needed for muscle spasms.   nitrofurantoin (macrocrystal-monohydrate) 100 MG capsule Commonly known as: MACROBID Take 1 capsule (100 mg total) by mouth every 12 (twelve) hours.   omeprazole 40 MG capsule Commonly known as: PRILOSEC Take 40 mg by mouth daily.   ondansetron 4 MG disintegrating tablet Commonly known as: ZOFRAN-ODT Take 4 mg by mouth every 6 (six) hours as needed for nausea.   PRESERVISION AREDS 2 PO Take 2 tablets by mouth daily.   tamsulosin 0.4 MG Caps capsule Commonly known as: FLOMAX Take 0.4 mg by mouth every evening.         Allergies  Allergen Reactions   Contrast Media [Iodinated Diagnostic Agents] Hives, Itching and Rash   Codeine Nausea And Vomiting      The results of significant diagnostics from this hospitalization (including imaging, microbiology, ancillary and laboratory) are listed below for reference.    Significant Diagnostic Studies: DG Chest 1 View  Result Date: 12/10/2020 CLINICAL DATA:  Pt presents from home via EMS following a fall while trying to get to the restroom. Pt endorses left hip pain - notable shortening on  exam. Pt states he hit his head - denies LOC. Hx of dementia EXAM: CHEST  1 VIEW COMPARISON:  08/28/2020 FINDINGS: Coarse interstitial markings, most prominent in the left lower lung. No confluent airspace disease or overt edema. Heart size and mediastinal contours are within normal limits. Aortic Atherosclerosis (ICD10-170.0). CABG marker. No effusion.  No pneumothorax. Sternotomy wires. Lumbar fixation hardware partially visualized. Vertebral endplate spurring at multiple levels in the thoracolumbar spine. IMPRESSION: No acute cardiopulmonary disease. Electronically Signed   By: D  Hassell M.D.   On: 12/10/2020 05:53   CT HEAD WO CONTRAST (5MM)  Result Date: 01/03/2021 CLINICAL DATA:  Mental status change, unknown cause EXAM: CT HEAD WITHOUT CONTRAST TECHNIQUE: Contiguous axial images were obtained from the base of the  skull through the vertex without intravenous contrast. COMPARISON:  December 10, 2020. FINDINGS: Brain: No evidence of acute large vascular territory infarction, hemorrhage, hydrocephalus, extra-axial collection or mass lesion/mass effect. Similar moderate patchy white matter hypoattenuation, nonspecific but compatible with chronic microvascular disease. Similar atrophy. Vascular: No hyperdense vessel. Calcific intracranial atherosclerosis. Skull: No evidence of acute fracture. Sinuses/Orbits: Mucosal thickening in the sphenoid sinuses. No acute orbital findings. Other: No mastoid effusions. IMPRESSION: 1. No evidence of acute intracranial abnormality. 2. Similar chronic microvascular ischemic disease and atrophy. Electronically Signed   By: Frederick S Jones M.D.   On: 01/03/2021 16:43   CT HEAD WO CONTRAST (5MM)  Result Date: 12/10/2020 CLINICAL DATA:  Blunt trauma post fall EXAM: CT HEAD WITHOUT CONTRAST TECHNIQUE: Contiguous axial images were obtained from the base of the skull through the vertex without intravenous contrast. COMPARISON:  MR 08/09/2020 1 a FINDINGS: Brain: Diffuse parenchymal atrophy. Patchy areas of hypoattenuation in deep and periventricular white matter bilaterally. Negative for acute intracranial hemorrhage, mass lesion, acute infarction, midline shift, or mass-effect. Acute infarct may be inapparent on noncontrast CT. Ventricles and sulci symmetric. Vascular: Atherosclerotic and physiologic intracranial calcifications. Skull: Eccentric sclerotic slightly expansile lesion in the clivus centered to the left of midline as described on prior studies back to 12/13/2012, nonspecific but possibly chondroid lesion. Negative for fracture or acute lesion. Sinuses/Orbits: Retention cyst or polyp in the sphenoid sinus. No acute findings. Other: None IMPRESSION: 1. Negative for bleed or other acute intracranial process. 2. Atrophy and nonspecific white matter changes. 3. Sclerotic osseous lesion in  the clivus as described on prior studies back to 2014. Electronically Signed   By: D  Hassell M.D.   On: 12/10/2020 05:57   CT Cervical Spine Wo Contrast  Result Date: 12/10/2020 CLINICAL DATA:  Fell, blunt trauma to head EXAM: CT CERVICAL SPINE WITHOUT CONTRAST TECHNIQUE: Multidetector CT imaging of the cervical spine was performed without intravenous contrast. Multiplanar CT image reconstructions were also generated. COMPARISON:  MR 12/12/2012 FINDINGS: Alignment: Normal Skull base and vertebrae: Slightly expansile mostly sclerotic lesion in the clivus centered to the left of midline, as seen on studies dating back to 2014. No fracture. Soft tissues and spinal canal: Enlarged heterogenous thyroid, incompletely visualized, with rightward displacement and mild narrowing of the trachea at the thoracic inlet. In the setting of significant comorbidities or limited life expectancy, no follow-up recommended (ref: J Am Coll Radiol. 2015 Feb;12(2): 143-50). No prevertebral swelling or fluid. Right calcified carotid bifurcation plaque. No evident canal hematoma. Disc levels: Mild narrowing C5-6 and C6-7 with small anterior and posterior endplate spurs. Moderate narrowing C7-T1 with endplate spurring. Multilevel facet DJD. Upper chest: Visualized lung apices clear. Other: Suture material in the subcutaneous tissues and nuchal ligament of   the posterior cervical spine in the midline C4-C7. IMPRESSION: 1. Negative for fracture or other acute bone abnormality. 2. Cervical spondylitic and postop changes as above 3. Sclerotic osseous lesion in the clivus, as described on prior studies back to 2014. Electronically Signed   By: D  Hassell M.D.   On: 12/10/2020 06:05   CT PELVIS WO CONTRAST  Result Date: 12/10/2020 CLINICAL DATA:  87-year-old male status post fall with left hip pain, left femoral neck fracture suspected on radiographs. EXAM: CT PELVIS WITHOUT CONTRAST TECHNIQUE: Multidetector CT imaging of the pelvis was  performed following the standard protocol without intravenous contrast. COMPARISON:  Left hip series 0 455 hours today. CT Abdomen and Pelvis 08/28/2020. FINDINGS: Urinary Tract: Partially visible left renal nephrocalcinosis and exophytic cysts. Stable small right renal lower pole cyst. No hydroureter. Extensive bladder diverticula appear stable. Pelvic phleboliths. Bowel: New stool ball in the rectum, about 6 cm diameter with chronic retained stool in the sigmoid. But visible large and small bowel loops are nondilated. Normal gas-filled appendix on series 3, image 42. Vascular/Lymphatic: Aortoiliac calcified atherosclerosis. Vascular patency is not evaluated in the absence of IV contrast. No lymphadenopathy identified. Reproductive:  Negative. Other:  No pelvic free fluid.  No pelvic sidewall hematoma. Musculoskeletal: Chronic lumbar degeneration, decompression and fusion. Visible lumbar hardware appears stable and intact. Sacrum and SI joints appear stable and intact. Pelvis and proximal right femur appears stable and intact. Transverse fracture subcapital left femoral neck is mildly angulated and impacted (series 3, image 95). Small comminution fragment on series 4, image 69. The intertrochanteric segment remains intact. The left femoral head is normally located. IMPRESSION: 1. Confirmed left femoral neck fracture, subcapital mildly angulated, impacted, and slightly comminuted. 2. No other acute traumatic injury identified in the pelvis. 3. Increased rectal stool, consider mild fecal impaction. Chronic bladder diverticula, left nephrocalcinosis. 4. Aortic Atherosclerosis (ICD10-I70.0). Electronically Signed   By: H  Hall M.D.   On: 12/10/2020 07:17   DG Chest Port 1 View  Result Date: 01/04/2021 CLINICAL DATA:  Fever EXAM: PORTABLE CHEST 1 VIEW COMPARISON:  December 10, 2020 FINDINGS: The cardiomediastinal silhouette is unchanged in contour.Status post median sternotomy and CABG. Atherosclerotic  calcifications of the aorta. No pleural effusion. No pneumothorax. No acute pleuroparenchymal abnormality. Visualized abdomen is unremarkable. Multilevel degenerative changes of the thoracic spine. IMPRESSION: No acute cardiopulmonary abnormality. Electronically Signed   By: Stephanie  Peacock M.D.   On: 01/04/2021 11:50   DG HIP UNILAT W OR W/O PELVIS 2-3 VIEWS LEFT  Result Date: 12/10/2020 CLINICAL DATA:  Status post left total hip replacement. EXAM: DG HIP (WITH OR WITHOUT PELVIS) 2-3V LEFT COMPARISON:  Left hip radiographs earlier today FINDINGS: Sequelae of interval left hip arthroplasty are identified. No acute fracture or dislocation is identified. Postoperative gas is noted in the surrounding soft tissues, and skin staples are in place. Mild right hip osteoarthrosis is noted. IMPRESSION: Interval left hip arthroplasty. Electronically Signed   By: Allen  Grady M.D.   On: 12/10/2020 18:50   DG Hip Unilat W or Wo Pelvis 2-3 Views Left  Result Date: 12/10/2020 CLINICAL DATA:  Fell, left hip pain EXAM: DG HIP (WITH OR WITHOUT PELVIS) 2-3V LEFT COMPARISON:  07/18/2020 FINDINGS: Probable impacted left femoral neck fracture. No dislocation. Bony pelvis intact. Fixation hardware in the lower lumbar spine partially visualized. Degenerative disc disease L5-S1. Bilateral pelvic phleboliths. Patchy arterial calcifications. IMPRESSION: Probable impacted left femoral neck fracture, new since previous. Electronically Signed   By: D  Hassell M.D.     On: 12/10/2020 05:52    Microbiology: Recent Results (from the past 240 hour(s))  Resp Panel by RT-PCR (Flu A&B, Covid) Nasopharyngeal Swab     Status: None   Collection Time: 01/03/21  3:34 PM   Specimen: Nasopharyngeal Swab; Nasopharyngeal(NP) swabs in vial transport medium  Result Value Ref Range Status   SARS Coronavirus 2 by RT PCR NEGATIVE NEGATIVE Final    Comment: (NOTE) SARS-CoV-2 target nucleic acids are NOT DETECTED.  The SARS-CoV-2 RNA is  generally detectable in upper respiratory specimens during the acute phase of infection. The lowest concentration of SARS-CoV-2 viral copies this assay can detect is 138 copies/mL. A negative result does not preclude SARS-Cov-2 infection and should not be used as the sole basis for treatment or other patient management decisions. A negative result may occur with  improper specimen collection/handling, submission of specimen other than nasopharyngeal swab, presence of viral mutation(s) within the areas targeted by this assay, and inadequate number of viral copies(<138 copies/mL). A negative result must be combined with clinical observations, patient history, and epidemiological information. The expected result is Negative.  Fact Sheet for Patients:  https://www.fda.gov/media/152166/download  Fact Sheet for Healthcare Providers:  https://www.fda.gov/media/152162/download  This test is no t yet approved or cleared by the [place] FDA and  has been authorized for detection and/or diagnosis of SARS-CoV-2 by FDA under an Emergency Use Authorization (EUA). This EUA will remain  in effect (meaning this test can be used) for the duration of the COVID-19 declaration under Section 564(b)(1) of the Act, 21 U.S.C.section 360bbb-3(b)(1), unless the authorization is terminated  or revoked sooner.       Influenza A by PCR NEGATIVE NEGATIVE Final   Influenza B by PCR NEGATIVE NEGATIVE Final    Comment: (NOTE) The Xpert Xpress SARS-CoV-2/FLU/RSV plus assay is intended as an aid in the diagnosis of influenza from Nasopharyngeal swab specimens and should not be used as a sole basis for treatment. Nasal washings and aspirates are unacceptable for Xpert Xpress SARS-CoV-2/FLU/RSV testing.  Fact Sheet for Patients: https://www.fda.gov/media/152166/download  Fact Sheet for Healthcare Providers: https://www.fda.gov/media/152162/download  This test is not yet approved or cleared by the [place]  [place] FDA and has been authorized for detection and/or diagnosis of SARS-CoV-2 by FDA under an Emergency Use Authorization (EUA). This EUA will remain in effect (meaning this test can be used) for the duration of the COVID-19 declaration under Section 564(b)(1) of the Act, 21 U.S.C. section 360bbb-3(b)(1), unless the authorization is terminated or revoked.  Performed at [facility], [address] [place], [place] [address]   Urine Culture     Status: Abnormal   Collection Time: 01/03/21  7:42 PM   Specimen: Urine, Random  Result Value Ref Range Status   Specimen Description   Final    URINE, RANDOM Performed at [facility], [address] [place], [place] [address]    Special Requests   Final    NONE Performed at [facility], [address] [place], [place] [address]    Culture (A)  Final    >=100,000 COLONIES/mL ESCHERICHIA COLI >=100,000 COLONIES/mL ENTEROCOCCUS FAECALIS    Report Status 01/06/2021 FINAL  Final   Organism ID, Bacteria ESCHERICHIA COLI (A)  Final   Organism ID, Bacteria ENTEROCOCCUS FAECALIS (A)  Final      Susceptibility   Escherichia coli - MIC*    AMPICILLIN >=32 RESISTANT Resistant     CEFAZOLIN 16 SENSITIVE Sensitive     CEFEPIME <=0.12 SENSITIVE Sensitive       CEFTRIAXONE <=0.25 SENSITIVE Sensitive     CIPROFLOXACIN <=0.25 SENSITIVE Sensitive     GENTAMICIN <=1 SENSITIVE Sensitive     IMIPENEM <=0.25 SENSITIVE Sensitive     NITROFURANTOIN <=16 SENSITIVE Sensitive     TRIMETH/SULFA <=20 SENSITIVE Sensitive     AMPICILLIN/SULBACTAM >=32 RESISTANT Resistant     PIP/TAZO <=4 SENSITIVE Sensitive     * >=100,000 COLONIES/mL ESCHERICHIA COLI   Enterococcus faecalis - MIC*    AMPICILLIN <=2 SENSITIVE Sensitive     NITROFURANTOIN <=16 SENSITIVE Sensitive     VANCOMYCIN 1 SENSITIVE Sensitive     * >=100,000 COLONIES/mL ENTEROCOCCUS FAECALIS  CULTURE, BLOOD (ROUTINE X 2) w Reflex to ID Panel     Status: None  (Preliminary result)   Collection Time: 01/04/21 11:19 AM   Specimen: BLOOD  Result Value Ref Range Status   Specimen Description BLOOD RIGHT HAND  Final   Special Requests   Final    BOTTLES DRAWN AEROBIC AND ANAEROBIC Blood Culture adequate volume   Culture   Final    NO GROWTH 4 DAYS Performed at [facility], [address] [place], [place] [address]    Report Status PENDING  Incomplete  CULTURE, BLOOD (ROUTINE X 2) w Reflex to ID Panel     Status: None (Preliminary result)   Collection Time: 01/04/21 11:19 AM   Specimen: BLOOD  Result Value Ref Range Status   Specimen Description BLOOD  LEFT HAND  Final   Special Requests   Final    BOTTLES DRAWN AEROBIC AND ANAEROBIC Blood Culture results may not be optimal due to an excessive volume of blood received in culture bottles   Culture   Final    NO GROWTH 4 DAYS Performed at [facility], [address] [place], [place] [address]    Report Status PENDING  Incomplete  MRSA Next Gen by PCR, Nasal     Status: None   Collection Time: 01/04/21  7:27 PM   Specimen: Nasal Mucosa; Nasal Swab  Result Value Ref Range Status   MRSA by PCR Next Gen NOT DETECTED NOT DETECTED Final    Comment: (NOTE) The GeneXpert MRSA Assay (FDA approved for NASAL specimens only), is one component of a comprehensive MRSA colonization surveillance program. It is not intended to diagnose MRSA infection nor to guide or monitor treatment for MRSA infections. Test performance is not FDA approved in patients less than 73 years old. Performed at [facility], [address] [place], [place] [address]      Labs: Basic Metabolic Panel: Recent Labs  Lab 01/03/21 1237 01/04/21 0453 01/05/21 0433 01/06/21 0410  NA 138 142 142 141  K 4.3 4.1 3.7 3.5  CL 108 117* 115* 112*  CO2 22 18* 18* 20*  GLUCOSE 108* 107* 96 106*  BUN 40* 40* 36* 34*  CREATININE 1.52* 1.34* 1.27* 1.21  CALCIUM 9.0 7.8* 8.3* 8.6*   Liver Function  Tests: Recent Labs  Lab 01/03/21 1237  AST 17  ALT 16  ALKPHOS 187*  BILITOT 1.4*  PROT 6.4*  ALBUMIN 3.0*   No results for input(s): LIPASE, AMYLASE in the last 168 hours. No results for input(s): AMMONIA in the last 168 hours. CBC: Recent Labs  Lab 01/03/21 1237 01/04/21 0453  WBC 12.0* 10.0  HGB 11.3* 9.3*  HCT 35.4* 29.4*  MCV 88.9 88.0  PLT 236 181   Cardiac Enzymes: No results for input(s): CKTOTAL, CKMB, CKMBINDEX, TROPONINI in the last 168 hours. BNP: BNP (  last 3 results) No results for input(s): BNP in the last 8760 hours.  ProBNP (last 3 results) No results for input(s): PROBNP in the last 8760 hours.  CBG: No results for input(s): GLUCAP in the last 168 hours.     Signed:  Annita Brod, MD [facility] 01/08/2021, 10:08 AM

## 2021-01-09 LAB — CULTURE, BLOOD (ROUTINE X 2)
Culture: NO GROWTH
Culture: NO GROWTH
Special Requests: ADEQUATE

## 2021-01-10 ENCOUNTER — Telehealth: Payer: Self-pay | Admitting: Nurse Practitioner

## 2021-01-10 NOTE — Telephone Encounter (Signed)
Copied from [place] [id number]. Topic: General - Other >> Jan 10, 2021 11:04 AM Valere Dross wrote: Reason for CRM: Pts wife called in stating [place] came out to the home yesterday, and was suppose to call the office to see about getting the pt some pain medication, but she never called, so pt is requesting to be able to get some medication sent over to the pharmacy. Please advise

## 2021-01-14 ENCOUNTER — Telehealth: Payer: Self-pay | Admitting: *Deleted

## 2021-01-14 NOTE — Telephone Encounter (Signed)
Mitzi, [facility]  [phone]  Meeting last week: New changes Needs verbal order for RF of medications that are already profiled Request Order:  Haldol 5mg  every 4 hours as needed or lorazepam 0.5 every 4 hours per comfort orders- starting low dose and goes up- usually starting with lorazepam  May call Mitzi with verbal order so it can be put in care plan

## 2021-01-15 NOTE — Telephone Encounter (Signed)
Called and spoke with Jason Cross at [facility]. She provided me with [name]'s number, the patient's nurse.   Called nurse and spoke with her regarding this. She states that this has already been taken care of and that the patient got his medication earlier this week.

## 2021-01-20 ENCOUNTER — Telehealth: Payer: Self-pay | Admitting: Nurse Practitioner

## 2021-01-20 NOTE — Telephone Encounter (Signed)
Copied from [phone]. Topic: General - Other >> Jan 20, 2021  1:04 PM Leward Quan A wrote: Reason for CRM: Velva Harman with [facility] called to inform Jason Cross that patient passed away on February 11, 2021 with hospice present and they need his death certificate signed it is in the portal please advise. Any questions please call Velva Harman at Ph# [phone]

## 2021-01-23 DEATH — deceased

## 2021-02-03 ENCOUNTER — Ambulatory Visit: Payer: Medicare HMO | Admitting: Nurse Practitioner

## 2021-09-29 ENCOUNTER — Ambulatory Visit: Payer: Self-pay | Admitting: Physician Assistant

## 2021-11-28 ENCOUNTER — Ambulatory Visit: Payer: Medicare HMO

## 2022-05-26 IMAGING — CT CT RENAL STONE PROTOCOL
2 of 4 series · 15 of 46 positions shown, 17 images · non-contrast
Comparison: CT 09/09/2005 and 12/19/2012. Abdominal MRI 12/26/2014.

CLINICAL DATA: Hematuria, unknown cause. Corroboration of patient
history limited by patient dementia.

EXAM:
CT ABDOMEN AND PELVIS WITHOUT CONTRAST
TECHNIQUE: Multidetector CT imaging of the abdomen and pelvis was performed
following the standard protocol without IV contrast.

[Series 2: stone full standard · axial · 0.86mm/px · z∈[-756,-311]mm · 12 of 103 slices shown, 14 images]
[im 9/103  soft-tissue]
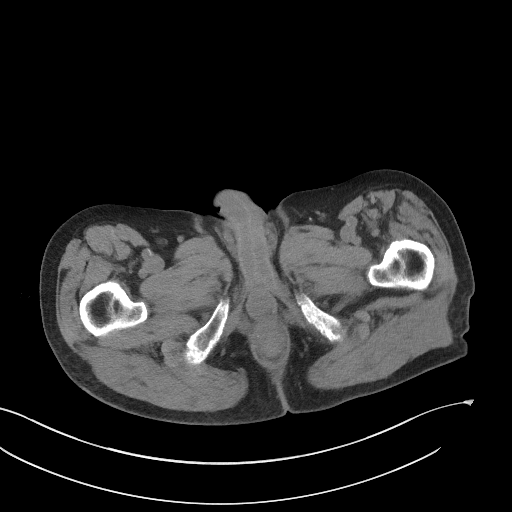
[im 9/103  bone]
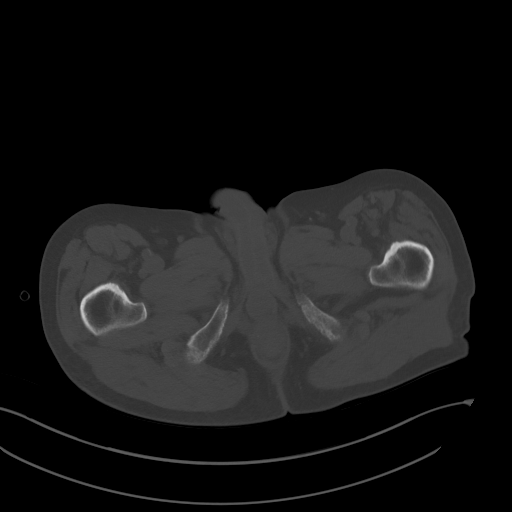
[im 17/103  soft-tissue]
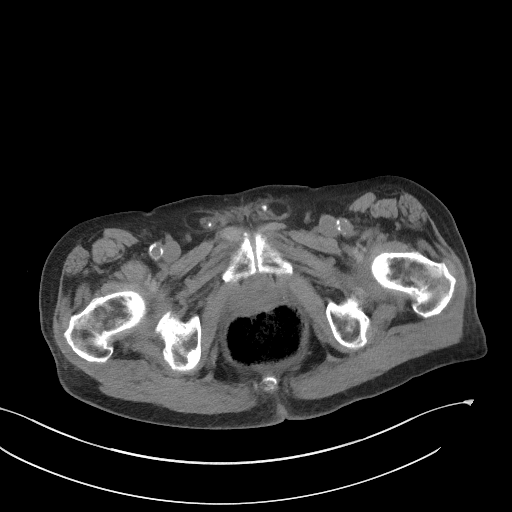
[im 25/103  soft-tissue]
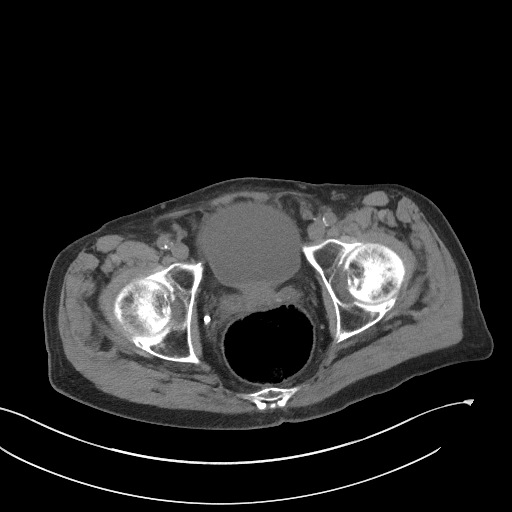
[im 33/103  soft-tissue]
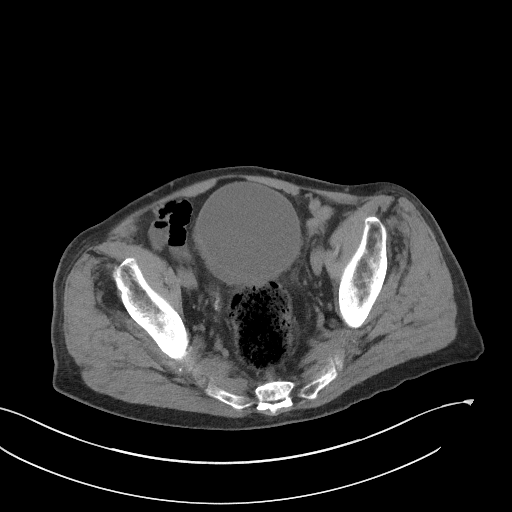
[im 41/103  soft-tissue]
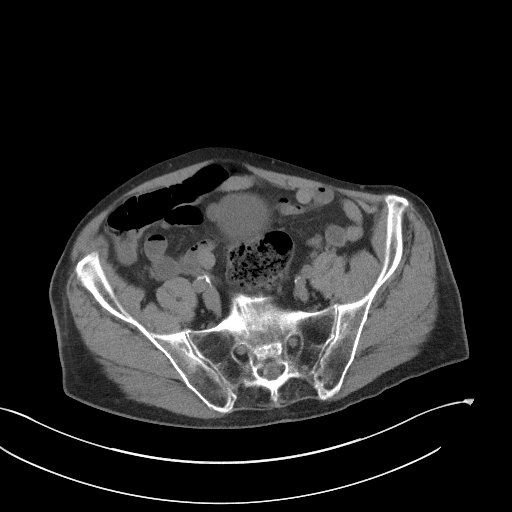
[im 49/103  soft-tissue]
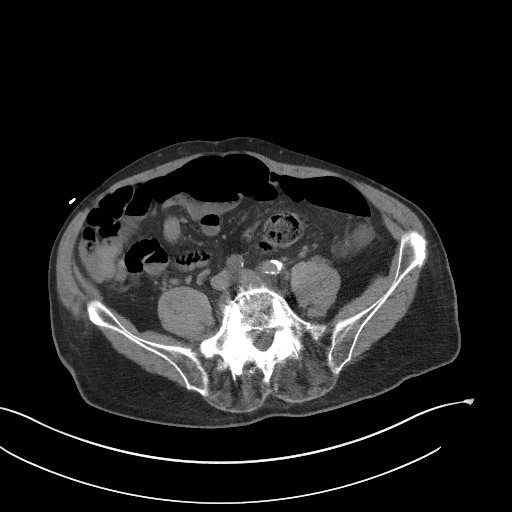
[im 58/103  soft-tissue]
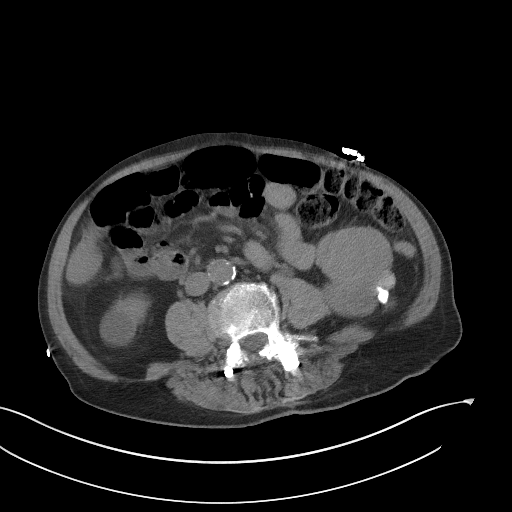
[im 66/103  soft-tissue]
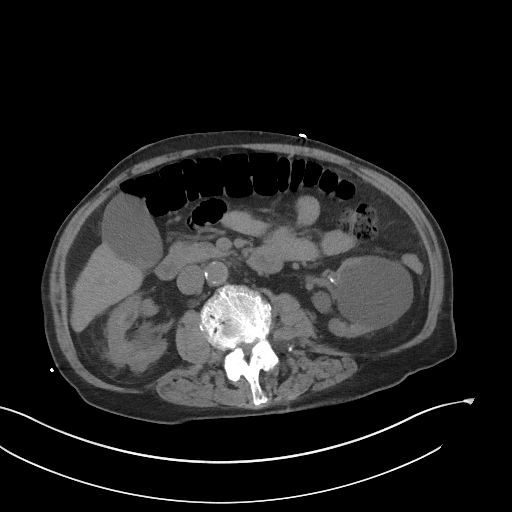
[im 74/103  soft-tissue]
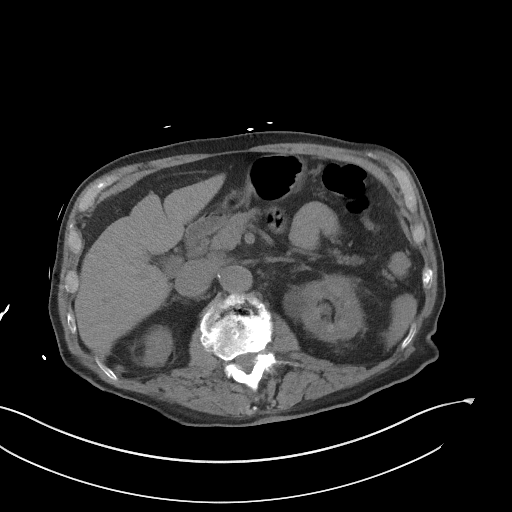
[im 74/103  bone]
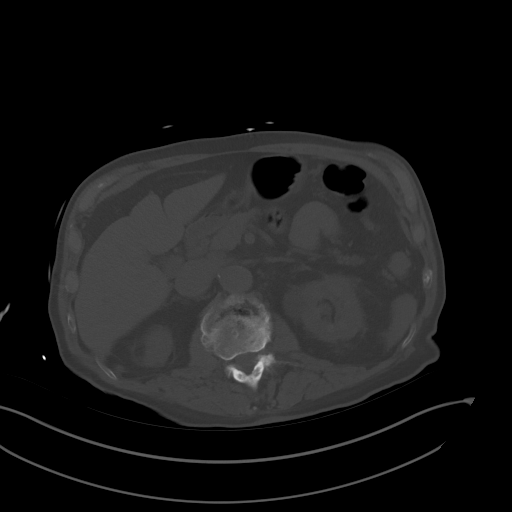
[im 82/103  soft-tissue]
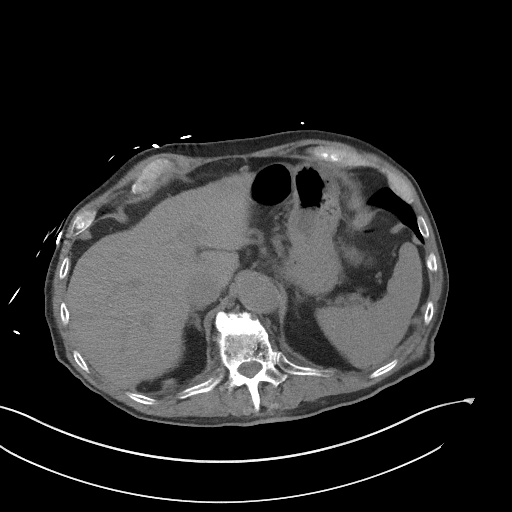
[im 90/103  soft-tissue]
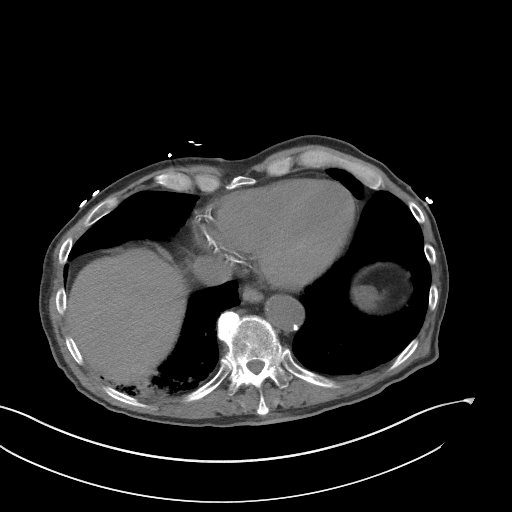
[im 98/103  soft-tissue]
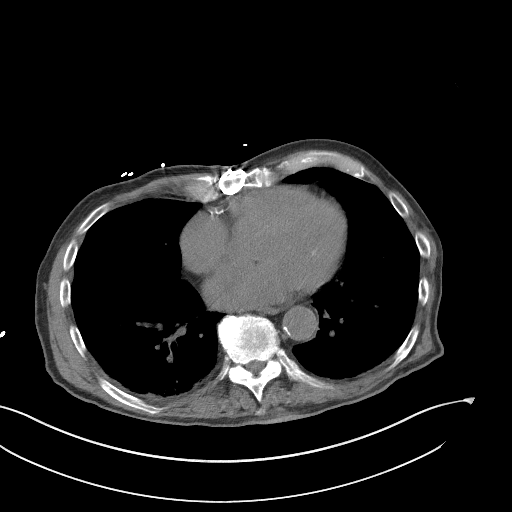

[Series 5: coronal · coronal · 0.78mm/px · 3 of 126 slices shown]
[im 42/126  soft-tissue]
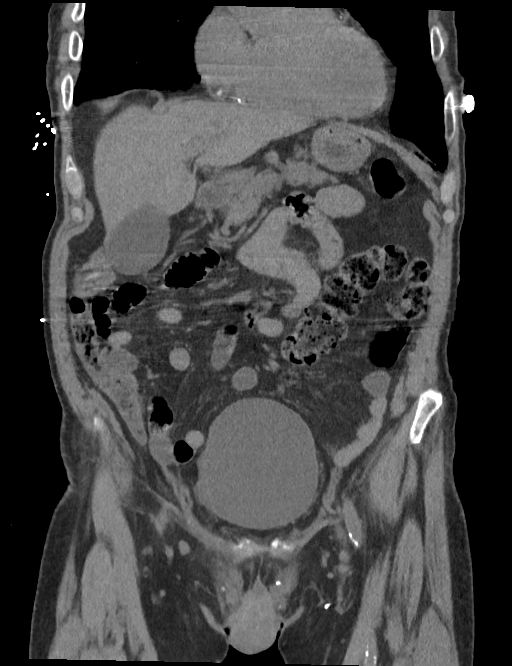
[im 56/126  soft-tissue]
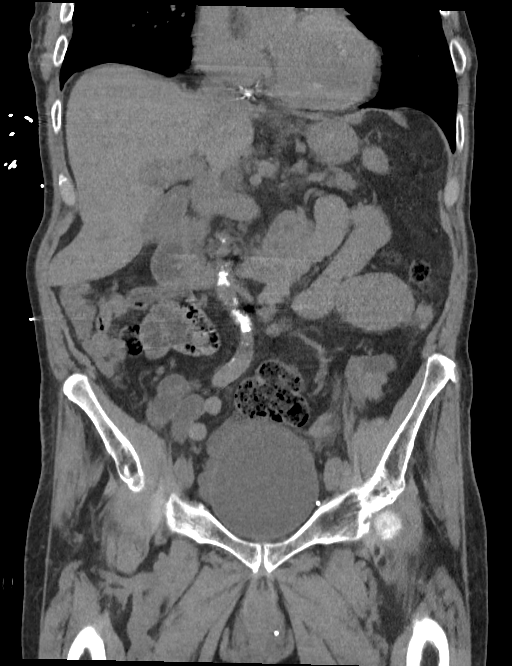
[im 70/126  soft-tissue]
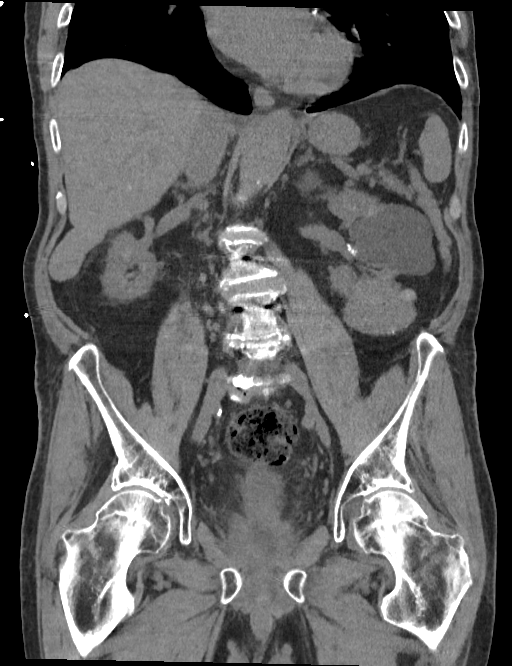

[15 of 46 positions shown; findings below may reference images not displayed]

FINDINGS: Lower chest: New patchy dependent opacity at the right lung base
which may reflect atelectasis, scarring or an infiltrate. No
significant pleural or pericardial effusion. Extensive coronary
artery atherosclerosis post median sternotomy.

Hepatobiliary: Previously demonstrated small a patent lesions are
not well seen on this noncontrast study. The gallbladder is mildly
distended without wall thickening or surrounding inflammation. No
significant biliary dilatation.

Pancreas: Unremarkable. No pancreatic ductal dilatation or
surrounding inflammatory changes.

Spleen: Normal in size without focal abnormality.

Adrenals/Urinary Tract: Both adrenal glands appear normal. Probable
single nonobstructing calculus in the interpolar region of the right
kidney, measuring 4 mm on image 35/2. No evidence of ureteral
calculus or hydronephrosis. Multiple renal cysts are again noted
bilaterally, including a mildly hyperdense lesion in the lower pole
of the left kidney measuring up to 6.7 cm on image 48/2, similar to
previous CT. There is another complex, partially calcified cystic
lesion more posteriorly in the lower pole of the left kidney which
also appears unchanged. The bladder is heavily trabeculated with
multiple diverticula.

Stomach/Bowel: No enteric contrast administered. The stomach appears
unremarkable for its degree of distension. No evidence of bowel wall
thickening, distention or surrounding inflammatory change. The
appendix appears normal. There is prominent stool in the sigmoid
colon and rectum.

Vascular/Lymphatic: There are no enlarged abdominal or pelvic lymph
nodes. Aortic and branch vessel atherosclerosis.

Reproductive: Trans urethral defect in the prostate gland.

Other: Mildly prominent fat in both inguinal canals. No ascites or
free air.

Musculoskeletal: No acute or significant osseous findings.
Multilevel thoracolumbar spondylosis status post L3 through L5
fusion. Stable small sclerotic lesions in the right iliac bone and
ischium.
IMPRESSION: 1. Single nonobstructing right renal calculus. No evidence of
ureteral calculus or hydronephrosis.
2. Heavily trabeculated urinary bladder with multiple diverticula
consistent with chronic bladder outlet obstruction. Previous trans
urethral resection of prostate gland.
3. Multiple simple and mildly complex bilateral renal cysts, similar
to previous study.
4. Prominent stool in the distal colon.
5. New patchy airspace disease at the right lung base which may
reflect atelectasis, scarring or infiltrate.
6.  Aortic Atherosclerosis ([id number]).
